# Patient Record
Sex: Male | Born: 1945 | Race: White | Hispanic: No | Marital: Married | Smoking: Former smoker
Health system: Southern US, Community
[De-identification: ages and names within clinical notes are randomized; demographics above are authoritative.]

## PROBLEM LIST (undated history)

## (undated) DIAGNOSIS — I2699 Other pulmonary embolism without acute cor pulmonale: Secondary | ICD-10-CM

## (undated) DIAGNOSIS — R1310 Dysphagia, unspecified: Secondary | ICD-10-CM

## (undated) DIAGNOSIS — U099 Post covid-19 condition, unspecified: Secondary | ICD-10-CM

## (undated) DIAGNOSIS — C140 Malignant neoplasm of pharynx, unspecified: Secondary | ICD-10-CM

## (undated) HISTORY — PX: HERNIA REPAIR: SHX51

---

## 2020-01-10 ENCOUNTER — Inpatient Hospital Stay
Admission: EM | Admit: 2020-01-10 | Discharge: 2020-01-12 | DRG: 177 | Disposition: A | Discharge status: Home / Self Care | Payer: No Typology Code available for payment source | Attending: Internal Medicine | Admitting: Internal Medicine

## 2020-01-10 ENCOUNTER — Other Ambulatory Visit: Payer: Self-pay

## 2020-01-10 ENCOUNTER — Emergency Department: Payer: No Typology Code available for payment source

## 2020-01-10 DIAGNOSIS — U071 COVID-19: Secondary | ICD-10-CM | POA: Diagnosis present

## 2020-01-10 DIAGNOSIS — D6959 Other secondary thrombocytopenia: Secondary | ICD-10-CM | POA: Diagnosis present

## 2020-01-10 DIAGNOSIS — R531 Weakness: Secondary | ICD-10-CM | POA: Diagnosis present

## 2020-01-10 DIAGNOSIS — R030 Elevated blood-pressure reading, without diagnosis of hypertension: Secondary | ICD-10-CM | POA: Diagnosis not present

## 2020-01-10 DIAGNOSIS — J1282 Pneumonia due to coronavirus disease 2019: Secondary | ICD-10-CM | POA: Diagnosis present

## 2020-01-10 DIAGNOSIS — Z87891 Personal history of nicotine dependence: Secondary | ICD-10-CM

## 2020-01-10 DIAGNOSIS — E871 Hypo-osmolality and hyponatremia: Secondary | ICD-10-CM | POA: Diagnosis present

## 2020-01-10 DIAGNOSIS — D72819 Decreased white blood cell count, unspecified: Secondary | ICD-10-CM | POA: Diagnosis present

## 2020-01-10 DIAGNOSIS — R7401 Elevation of levels of liver transaminase levels: Secondary | ICD-10-CM | POA: Diagnosis present

## 2020-01-10 DIAGNOSIS — J9601 Acute respiratory failure with hypoxia: Secondary | ICD-10-CM | POA: Diagnosis present

## 2020-01-10 HISTORY — DX: COVID-19: U07.1

## 2020-01-10 HISTORY — DX: Pneumonia due to coronavirus disease 2019: J12.82

## 2020-01-10 LAB — TROPONIN I (HIGH SENSITIVITY): Troponin I (High Sensitivity): 21 ng/L — ABNORMAL HIGH (ref <18)

## 2020-01-10 LAB — COMPREHENSIVE METABOLIC PANEL
ALT: 14 U/L (ref 0–44)
AST: 50 U/L — ABNORMAL HIGH (ref 15–41)
Albumin: 3.2 g/dL — ABNORMAL LOW (ref 3.5–5.0)
Alkaline Phosphatase: 40 U/L (ref 38–126)
Anion gap: 11 (ref 5–15)
BUN: 18 mg/dL (ref 8–23)
CO2: 21 mmol/L — ABNORMAL LOW (ref 22–32)
Calcium: 7.6 mg/dL — ABNORMAL LOW (ref 8.9–10.3)
Chloride: 98 mmol/L (ref 98–111)
Creatinine, Ser: 1.02 mg/dL (ref 0.61–1.24)
GFR calc Af Amer: >60 mL/min (ref >60)
GFR calc non Af Amer: >60 mL/min (ref >60)
Glucose, Bld: 112 mg/dL — ABNORMAL HIGH (ref 70–99)
Potassium: 4.5 mmol/L (ref 3.5–5.1)
Sodium: 130 mmol/L — ABNORMAL LOW (ref 135–145)
Total Bilirubin: 1.5 mg/dL — ABNORMAL HIGH (ref 0.3–1.2)
Total Protein: 6 g/dL — ABNORMAL LOW (ref 6.5–8.1)

## 2020-01-10 LAB — CBC
HCT: 39.6 % (ref 39.0–52.0)
Hemoglobin: 13.8 g/dL (ref 13.0–17.0)
MCH: 30.6 pg (ref 26.0–34.0)
MCHC: 34.8 g/dL (ref 30.0–36.0)
MCV: 87.8 fL (ref 80.0–100.0)
Platelets: 86 10*3/uL — ABNORMAL LOW (ref 150–400)
RBC: 4.51 MIL/uL (ref 4.22–5.81)
RDW: 14.6 % (ref 11.5–15.5)
WBC: 3.4 10*3/uL — ABNORMAL LOW (ref 4.0–10.5)
nRBC: 0 % (ref 0.0–0.2)

## 2020-01-10 LAB — POC SARS CORONAVIRUS 2 AG: SARS Coronavirus 2 Ag: POSITIVE — AB

## 2020-01-10 LAB — BRAIN NATRIURETIC PEPTIDE: B Natriuretic Peptide: 73 pg/mL (ref 0.0–100.0)

## 2020-01-10 MED ORDER — DEXAMETHASONE SODIUM PHOSPHATE 10 MG/ML IJ SOLN
10.0000 mg | Freq: Once | INTRAMUSCULAR | Status: AC
  Administered 2020-01-10: 23:00:00 10 mg via INTRAVENOUS
  Filled 2020-01-10: qty 1

## 2020-01-10 MED ORDER — SODIUM CHLORIDE 0.9 % IV SOLN
100.0000 mg | Freq: Every day | INTRAVENOUS | Status: DC
  Administered 2020-01-12: 100 mg via INTRAVENOUS
  Filled 2020-01-10: qty 20
  Filled 2020-01-10: qty 100

## 2020-01-10 MED ORDER — IOHEXOL 350 MG/ML SOLN
75.0000 mL | Freq: Once | INTRAVENOUS | Status: AC | PRN
  Administered 2020-01-10: 23:00:00 75 mL via INTRAVENOUS

## 2020-01-10 MED ORDER — SODIUM CHLORIDE 0.9 % IV SOLN
200.0000 mg | Freq: Once | INTRAVENOUS | Status: AC
  Administered 2020-01-11: 01:00:00 200 mg via INTRAVENOUS
  Filled 2020-01-10: qty 40

## 2020-01-10 NOTE — Progress Notes (Signed)
Remdesivir - Pharmacy Brief Note   O:  ALT: 14 CXR: evidence of infection SpO2: 98% on [place]    A/P:  Remdesivir 200 mg IVPB once followed by 100 mg IVPB daily x 4 days.   Hart Robinsons, PharmD Clinical Pharmacist  01/10/2020 11:07 PM

## 2020-01-10 NOTE — ED Provider Notes (Signed)
[facility] Emergency Department Provider Note  Time seen: 9:33 PM  I have reviewed the triage vital signs and the nursing notes.   HISTORY  Chief Complaint Weakness, dyspnea  HPI Geoffrey Wells is a 74 y.o. male presents to the emergency department for weakness and dyspnea on exertion.  According to the patient for the past several weeks he has been feeling very short of breath especially with exertion.  States he is also been feeling weak and fatigued with occasional subjective fever.  Patient states he wants to make sure he does not have Covid.  Patient found to be hypoxic in the 80s by EMS was placed on 3 L of O2.  They attempted to walk the patient to the ambulance and he desatted significantly requiring 6 L of oxygen.  Patient is back to 2 L of oxygen satting in the mid 90s.  No baseline O2 requirement.  Patient states slight cough but denies any chest pain at any point.  No past medical history on file.  There are no problems to display for this patient.   Prior to Admission medications   Not on File    Not on File  No family history on file.  Social History Social History   Tobacco Use  . Smoking status: Not on file  Substance Use Topics  . Alcohol use: Not on file  . Drug use: Not on file    Review of Systems Constitutional: Subjective fever at times.  Positive for generalized fatigue/weakness Cardiovascular: Negative for chest pain. Respiratory: Positive for shortness of breath worse with exertion.  Occasional cough. Gastrointestinal: Negative for abdominal pain Genitourinary: Negative for urinary compaints Musculoskeletal: Negative for musculoskeletal complaints Neurological: Negative for headache All other ROS negative  ____________________________________________   PHYSICAL EXAM:  VITAL SIGNS: ED Triage Vitals  Enc Vitals Group     BP      Pulse      Resp      Temp      Temp src      SpO2      Weight      Height      Head  Circumference      Peak Flow      Pain Score      Pain Loc      Pain Edu?      Excl. in [place]?     Constitutional: Alert and oriented. Well appearing and in no distress. Eyes: Normal exam ENT      Head: Normocephalic and atraumatic.      Mouth/Throat: Mucous membranes are moist. Cardiovascular: Normal rate, regular rhythm.  Respiratory: Normal respiratory effort without tachypnea nor retractions. Breath sounds are clear.  No wheeze rales or rhonchi. Gastrointestinal: Soft and nontender. No distention. Musculoskeletal: Nontender with normal range of motion in all extremities. No lower extremity tenderness or edema. Neurologic:  Normal speech and language. No gross focal neurologic deficits  Skin:  Skin is warm, dry and intact.  Psychiatric: Mood and affect are normal.  ____________________________________________    EKG  EKG viewed and interpreted by myself shows a normal sinus rhythm at 75 bpm with a narrow QRS, normal axis, largely normal intervals, no concerning ST changes.  ____________________________________________    [id number]  I have reviewed the CT images no obvious sign of PE awaiting radiology read.  ____________________________________________   INITIAL IMPRESSION / ASSESSMENT AND PLAN / ED COURSE  Pertinent labs & imaging results that were available during my care of the  patient were reviewed by me and considered in my medical decision making (see chart for details).   Patient presents to the emergency department for dyspnea on exertion generalized fatigue and weakness.  Differential is quite broad would include infectious etiologies such as pneumonia, Covid, UTI.  Could include metabolic abnormalities, dehydration.  We will check labs, urinalysis, IV hydrate.  Given the patient's hypoxia we will obtain CTA imaging of the chest to rule out pulmonary embolism or pneumonia.  We will also obtain a Covid swab.  Patient's Covid test has resulted positive.  I reviewed  the CTA images no obvious sign of PE.  Highly suspect Covid causing the patient's confusion and hypoxemia.  We will start the patient on remdesivir and Decadron and admit to the hospitalist service.  Geoffrey Wells was evaluated in Emergency Department on 01/10/2020 for the symptoms described in the history of present illness. He was evaluated in the context of the global COVID-19 pandemic, which necessitated consideration that the patient might be at risk for infection with the SARS-CoV-2 virus that causes COVID-19. Institutional protocols and algorithms that pertain to the evaluation of patients at risk for COVID-19 are in a state of rapid change based on information released by regulatory bodies including the CDC and federal and state organizations. These policies and algorithms were followed during the patient's care in the ED.  ____________________________________________   FINAL CLINICAL IMPRESSION(S) / ED DIAGNOSES  Dyspnea on exertion COVID-19   Harvest Dark, MD 01/10/20 2255

## 2020-01-10 NOTE — ED Triage Notes (Signed)
Pt arrives [facility] from home w cc of weakness. Pt family states he has been tired on exertion for the past week and 88% room air at home. Pt does not wear oxygen. Pt placed on 3L, sats in 90s until pt walked w EMS and desatted to 80s. Pt became diaphoretic and pale per EMS but improved when placed on 8L [place].  Pt arrives A&Ox4, placed back on 3L [place] and 94%. PT reports "I want to be tested for covid because I've been weak the past few weeks and coughing a little". Denies fevers at home, temp 99.9 here

## 2020-01-10 NOTE — ED Notes (Signed)
Pt states SOB on exertion. Pt states when he gets up and walk he gets SOB. Pt states he normally does not use oxygen but is on 2LPM via [place]. Pt on pulse ox, cardiac and BP monitoring. Admit provider at bedside.

## 2020-01-11 ENCOUNTER — Encounter: Payer: Self-pay | Admitting: Family Medicine

## 2020-01-11 DIAGNOSIS — J9621 Acute and chronic respiratory failure with hypoxia: Secondary | ICD-10-CM

## 2020-01-11 DIAGNOSIS — D696 Thrombocytopenia, unspecified: Secondary | ICD-10-CM

## 2020-01-11 LAB — BASIC METABOLIC PANEL
Anion gap: 9 (ref 5–15)
BUN: 18 mg/dL (ref 8–23)
CO2: 26 mmol/L (ref 22–32)
Calcium: 8.3 mg/dL — ABNORMAL LOW (ref 8.9–10.3)
Chloride: 98 mmol/L (ref 98–111)
Creatinine, Ser: 0.97 mg/dL (ref 0.61–1.24)
GFR calc Af Amer: >60 mL/min (ref >60)
GFR calc non Af Amer: >60 mL/min (ref >60)
Glucose, Bld: 155 mg/dL — ABNORMAL HIGH (ref 70–99)
Potassium: 4.4 mmol/L (ref 3.5–5.1)
Sodium: 133 mmol/L — ABNORMAL LOW (ref 135–145)

## 2020-01-11 LAB — CBC
HCT: 41.3 % (ref 39.0–52.0)
Hemoglobin: 14.3 g/dL (ref 13.0–17.0)
MCH: 30.1 pg (ref 26.0–34.0)
MCHC: 34.6 g/dL (ref 30.0–36.0)
MCV: 86.9 fL (ref 80.0–100.0)
Platelets: 88 10*3/uL — ABNORMAL LOW (ref 150–400)
RBC: 4.75 MIL/uL (ref 4.22–5.81)
RDW: 14.8 % (ref 11.5–15.5)
WBC: 2.9 10*3/uL — ABNORMAL LOW (ref 4.0–10.5)
nRBC: 0 % (ref 0.0–0.2)

## 2020-01-11 LAB — FIBRIN DERIVATIVES D-DIMER (ARMC ONLY): Fibrin derivatives D-dimer (ARMC): 1154.14 ng/mL (FEU) — ABNORMAL HIGH (ref 0.00–499.00)

## 2020-01-11 LAB — C-REACTIVE PROTEIN: CRP: 4.1 mg/dL — ABNORMAL HIGH (ref <1.0)

## 2020-01-11 LAB — TROPONIN I (HIGH SENSITIVITY): Troponin I (High Sensitivity): 25 ng/L — ABNORMAL HIGH (ref <18)

## 2020-01-11 LAB — FERRITIN: Ferritin: 344 ng/mL — ABNORMAL HIGH (ref 24–336)

## 2020-01-11 LAB — PROCALCITONIN
Procalcitonin: <0.1 ng/mL
Procalcitonin: <0.1 ng/mL

## 2020-01-11 LAB — ABO/RH: ABO/RH(D): O POS

## 2020-01-11 LAB — LACTATE DEHYDROGENASE: LDH: 235 U/L — ABNORMAL HIGH (ref 98–192)

## 2020-01-11 MED ORDER — SODIUM CHLORIDE 0.9 % IV SOLN: 100.0000 mg | Freq: Every day | INTRAVENOUS | Status: DC

## 2020-01-11 MED ORDER — ONDANSETRON HCL 4 MG PO TABS: 4.0000 mg | ORAL_TABLET | Freq: Four times a day (QID) | ORAL | Status: DC | PRN

## 2020-01-11 MED ORDER — ENOXAPARIN SODIUM 40 MG/0.4ML ~~LOC~~ SOLN
40.0000 mg | SUBCUTANEOUS | Status: DC
  Administered 2020-01-11 – 2020-01-12 (×2): 40 mg via SUBCUTANEOUS
  Filled 2020-01-11 (×2): qty 0.4

## 2020-01-11 MED ORDER — MAGNESIUM HYDROXIDE 400 MG/5ML PO SUSP
30.0000 mL | Freq: Every day | ORAL | Status: DC | PRN
  Filled 2020-01-11: qty 30

## 2020-01-11 MED ORDER — ASCORBIC ACID 500 MG PO TABS
500.0000 mg | ORAL_TABLET | Freq: Every day | ORAL | Status: DC
  Administered 2020-01-11 – 2020-01-12 (×2): 500 mg via ORAL
  Filled 2020-01-11 (×2): qty 1

## 2020-01-11 MED ORDER — SODIUM CHLORIDE 0.9 % IV SOLN: 200.0000 mg | Freq: Once | INTRAVENOUS | Status: DC

## 2020-01-11 MED ORDER — ZINC SULFATE 220 (50 ZN) MG PO CAPS
220.0000 mg | ORAL_CAPSULE | Freq: Every day | ORAL | Status: DC
  Administered 2020-01-11 – 2020-01-12 (×2): 220 mg via ORAL
  Filled 2020-01-11 (×2): qty 1

## 2020-01-11 MED ORDER — DEXAMETHASONE SODIUM PHOSPHATE 10 MG/ML IJ SOLN
6.0000 mg | INTRAMUSCULAR | Status: DC
  Administered 2020-01-11: 6 mg via INTRAVENOUS
  Filled 2020-01-11: qty 1

## 2020-01-11 MED ORDER — HYDROCOD POLST-CPM POLST ER 10-8 MG/5ML PO SUER: 5.0000 mL | Freq: Two times a day (BID) | ORAL | Status: DC | PRN

## 2020-01-11 MED ORDER — GUAIFENESIN-DM 100-10 MG/5ML PO SYRP
10.0000 mL | ORAL_SOLUTION | ORAL | Status: DC | PRN
  Filled 2020-01-11: qty 10

## 2020-01-11 MED ORDER — ONDANSETRON HCL 4 MG/2ML IJ SOLN: 4.0000 mg | Freq: Four times a day (QID) | INTRAMUSCULAR | Status: DC | PRN

## 2020-01-11 MED ORDER — TRAZODONE HCL 50 MG PO TABS: 25.0000 mg | ORAL_TABLET | Freq: Every evening | ORAL | Status: DC | PRN

## 2020-01-11 MED ORDER — ASPIRIN EC 81 MG PO TBEC
81.0000 mg | DELAYED_RELEASE_TABLET | Freq: Every day | ORAL | Status: DC
  Administered 2020-01-11 – 2020-01-12 (×2): 81 mg via ORAL
  Filled 2020-01-11 (×2): qty 1

## 2020-01-11 MED ORDER — IPRATROPIUM-ALBUTEROL 20-100 MCG/ACT IN AERS
1.0000 | INHALATION_SPRAY | Freq: Four times a day (QID) | RESPIRATORY_TRACT | Status: DC | PRN
  Filled 2020-01-11: qty 4

## 2020-01-11 MED ORDER — SODIUM CHLORIDE 0.9 % IV SOLN: INTRAVENOUS | Status: DC

## 2020-01-11 MED ORDER — FAMOTIDINE 20 MG PO TABS
20.0000 mg | ORAL_TABLET | Freq: Two times a day (BID) | ORAL | Status: DC
  Administered 2020-01-11 – 2020-01-12 (×4): 20 mg via ORAL
  Filled 2020-01-11 (×3): qty 1

## 2020-01-11 MED ORDER — VITAMIN D 25 MCG (1000 UNIT) PO TABS
1000.0000 [IU] | ORAL_TABLET | Freq: Every day | ORAL | Status: DC
  Administered 2020-01-11 – 2020-01-12 (×2): 1000 [IU] via ORAL
  Filled 2020-01-11 (×2): qty 1

## 2020-01-11 MED ORDER — ACETAMINOPHEN 325 MG PO TABS: 650.0000 mg | ORAL_TABLET | Freq: Four times a day (QID) | ORAL | Status: DC | PRN

## 2020-01-11 NOTE — Progress Notes (Signed)
Wife given nightly update 

## 2020-01-11 NOTE — H&P (Addendum)
[facility] [place] NAME: Geoffrey Wells    MR#:  [id number]  DATE OF BIRTH:  August 15, 1946  DATE OF ADMISSION:  01/10/2020  PRIMARY CARE PHYSICIAN: [facility] [place]   REQUESTING/REFERRING PHYSICIAN: Harvest Dark, MD CHIEF COMPLAINT:   Chief Complaint  Patient presents with  . Weakness    HISTORY OF PRESENT ILLNESS:  Geoffrey Wells  is a 74 y.o. Caucasian male with no known medical problems, who presented to the emergency room with acute onset of generalized fatigue and tiredness over the last 7 days with associated mild dry cough without wheezing or dyspnea.  He denied any loss of taste or smell, nausea or vomiting or diarrhea or abdominal pain.  He admitted to tactile fever and diaphoresis mainly on exertion but did not check his temperature.  No chest pain or palpitations.  Upon presentation to the emergency room, blood pressure was 147/89 with pulse oximetry of 94% on 3 L of O2 by nasal cannula and otherwise otherwise vital signs were within normal.  Labs revealed hyponatremia 130 and AST 50 with total protein of 6 with total bili 1.5.  BNP was 73 and high-sensitivity troponin I was 21.  CBC showed mild leukopenia of 3.4 and thrombocytopenia of 86.  COVID-19 antigen came back positive.  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  The patient was given IV remdesivir IV Decadron.  He will be admitted to a medically monitored bed for further evaluation and management.  PAST MEDICAL HISTORY:  History reviewed. No pertinent past medical history.  PAST SURGICAL HISTORY:   Past Surgical History:  Procedure Laterality Date  . HERNIA REPAIR      SOCIAL HISTORY:   Social History   Tobacco Use  . Smoking status: Former Smoker    Packs/day: 0.50    Years:  25.00    Pack years: 12.50    Types: Cigarettes  Substance Use Topics  . Alcohol use: Yes    Alcohol/week: 7.0 standard drinks    Types: 7 Cans of beer per week    FAMILY HISTORY:  No family history on file.  No pertinent familial diseases.  DRUG ALLERGIES:  No Known Allergies  REVIEW OF SYSTEMS:   ROS As per history of present illness. All pertinent systems were reviewed above. Constitutional,  HEENT, cardiovascular, respiratory, GI, GU, musculoskeletal, neuro, psychiatric, endocrine,  integumentary and hematologic systems were reviewed and are otherwise  negative/unremarkable except for positive findings mentioned above in the HPI.   MEDICATIONS AT HOME:   Prior to Admission medications   Not on File      VITAL SIGNS:  Blood pressure 117/83, pulse 72, temperature 99.9 F (37.7 C), temperature source Oral, resp. rate 20, height 6\' 1"  (1.854 m), weight 81.6 kg, SpO2 97 %.  PHYSICAL EXAMINATION:  Physical Exam  GENERAL:  74 y.o.-year-old Caucasian male patient lying in the bed with minimal respiratory distress with conversational dyspnea.   EYES: Pupils equal, round, reactive to light and accommodation. No scleral icterus. Extraocular muscles intact.  HEENT: Head atraumatic, normocephalic. Oropharynx and nasopharynx clear.  NECK:  Supple, no jugular venous distention. No thyroid enlargement, no tenderness.  LUNGS: Diminished bibasal breath sounds with bibasal crackles. CARDIOVASCULAR: Regular rate and rhythm, S1, S2 normal. No murmurs, rubs, or gallops.  ABDOMEN: Soft, nondistended, nontender. Bowel sounds present.  No organomegaly or mass.  EXTREMITIES: No pedal edema, cyanosis, or clubbing.  NEUROLOGIC: Cranial nerves II through XII are intact. Muscle strength 5/5 in all extremities. Sensation intact. Gait not checked.  PSYCHIATRIC: The patient is alert and oriented x 3.  Normal affect and good eye contact. SKIN: No obvious rash, lesion, or ulcer.   LABORATORY PANEL:    CBC Recent Labs  Lab 01/10/20 2133  WBC 3.4*  HGB 13.8  HCT 39.6  PLT 86*   ------------------------------------------------------------------------------------------------------------------  Chemistries  Recent Labs  Lab 01/10/20 2133  NA 130*  K 4.5  CL 98  CO2 21*  GLUCOSE 112*  BUN 18  CREATININE 1.02  CALCIUM 7.6*  AST 50*  ALT 14  ALKPHOS 40  BILITOT 1.5*   ------------------------------------------------------------------------------------------------------------------  Cardiac Enzymes No results for input(s): TROPONINI in the last 168 hours. ------------------------------------------------------------------------------------------------------------------  RADIOLOGY:  CT Angio Chest PE W and/or Wo Contrast  Result Date: 01/10/2020 CLINICAL DATA:  Hypoxemia EXAM: CT ANGIOGRAPHY CHEST WITH CONTRAST TECHNIQUE: Multidetector CT imaging of the chest was performed using the standard protocol during bolus administration of intravenous contrast. Multiplanar CT image reconstructions and MIPs were obtained to evaluate the vascular anatomy. CONTRAST:  15mL OMNIPAQUE IOHEXOL 350 MG/ML SOLN COMPARISON:  None. FINDINGS: Cardiovascular: There is a optimal opacification of the pulmonary arteries. There is no central,segmental, or subsegmental filling defects within the pulmonary arteries. There is mild cardiomegaly. No pericardial effusion or thickening. No evidence right heart strain. There is normal three-vessel brachiocephalic anatomy. The ascending intrathoracic aorta appears to be dilated measuring up to 4.3 cm which tapers at the level of the aortic arch, however contrast is preferentially within the pulmonary arteries. Scattered aortic atherosclerosis is seen. There is also atherosclerosis seen at the origin of the great vessels. Mediastinum/Nodes: No hilar, mediastinal, or axillary adenopathy. Thyroid gland, trachea, and esophagus demonstrate no significant findings.  Lungs/Pleura: Ground-glass hazy airspace opacities seen throughout the right lung with patchy airspace opacities seen posteriorly at the right lung base. There is also patchy airspace opacity seen at the posterior left lung base. No pleural effusion is seen. Upper Abdomen: No acute abnormalities present in the visualized portions of the upper abdomen. Musculoskeletal: No chest wall abnormality. No acute or significant osseous findings. Review of the MIP images confirms the above findings. IMPRESSION: 1. No central, segmental, or subsegmental pulmonary embolism. 2. Multifocal patchy ground-glass hazy airspace opacities seen throughout both lungs right greater than left, likely consistent with multifocal pneumonia. 3. 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 Recommend annual imaging followup by CTA or MRA. This recommendation follows 2010 ACCF/AHA/AATS/ACR/ASA/SCA/SCAI/SIR/STS/SVM Guidelines for the Diagnosis and Management of Patients with Thoracic Aortic Disease. Circulation. 2010; [id number]. Aortic aneurysm NOS (ICD10-I71.9) 4.  Aortic Atherosclerosis (ICD10-I70.0). Electronically Signed   By: Prudencio Pair M.D.   On: 01/10/2020 23:00      IMPRESSION AND PLAN:  1.  Acute hypoxemic respiratory failure secondary to COVID-19. -The patient will be admitted to a medically monitored isolation bed. -O2 protocol will be followed to keep O2 saturation above 93.   2.  Multifocal pneumonia with generalized weakness secondary to COVID-19. -The patient will be admitted to an isolation monitored bed with droplet and contact precautions. -The patient will be placed on scheduled Mucinex and as needed Tussionex. -We will follow CRP, ferritin, LDH and D-dimer. -Will follow manual differential for ANC/ALC ratio as well as follow troponin I and daily CBC with manual differential and CMP. - Will place the patient on IV  Remdesivir and IV steroid therapy with Decadron with elevated inflammatory markers. -The  patient will be placed on vitamin D3, vitamin C, zinc sulfate, p.o. Pepcid and aspirin. -Actemra can be considered for CRP more than 7 with associated hypoxemia.  3.  Elevated blood pressure. -Blood pressure will be monitored while he is here.  It has already improved.  4.  DVT prophylaxis. -Subcutaneous Lovenox.    All the records are reviewed and case discussed with ED provider. The plan of care was discussed in details with the patient (and family). I answered all questions. The patient agreed to proceed with the above mentioned plan. Further management will depend upon hospital course.   CODE STATUS: Full code  Status is: Inpatient  Remains inpatient appropriate because:Ongoing diagnostic testing needed not appropriate for outpatient work up, Unsafe d/c plan, IV treatments appropriate due to intensity of illness or inability to take PO and Inpatient level of care appropriate due to severity of illness   Dispo: The patient is from: Home              Anticipated d/c is to: Home              Anticipated d/c date is: 3 days              Patient currently is not medically stable to d/c.    TOTAL TIME TAKING CARE OF THIS PATIENT: 55 minutes.    Christel Mormon M.D on 01/11/2020 at 12:23 AM  [facility]   From 7 PM-7 AM, contact night-coverage [email or web address]  CC: Primary care physician; [place]   Note: This dictation was prepared with Dragon dictation along with smaller phrase technology. Any transcriptional errors that result from this process are unintentional.

## 2020-01-11 NOTE — Progress Notes (Signed)
PROGRESS NOTE    Geoffrey Wells  [id number] DOB: 1945-11-14 DOA: 01/10/2020 PCP: [facility] [place]:   Active Problems:   COVID-19  Acute hypoxic respiratory failure: secondary to COVID-19. Continue on IV remdesivir, decadron, vitamin c, & zinc. Continue on bronchodilators. Encourage incentive spirometry and flutter valve. Continue on supplemental oxygen and wean as tolerated   COVID19 pneumonia: Continue on IV remdesivir, decadron, vitamin c, & zinc. Continue on bronchodilators. Encourage incentive spirometry and flutter valve. Continue on supplemental oxygen and wean as tolerated. Airborne and contact precautions. Inflammatory markers are elevated.   Leukopenia: secondary to [place]. Will continue to monitor  Thrombocytopenia: secondary to [place]. Will continue to monitor   Transaminitis: likely secondary to [place]. ALT is WNL. AST is mildly elevated. Will continue to monitor  Generalized weakness: likely secondary to [place]. PT/OT consulted       DVT prophylaxis: lovenox Code Status: full  Family Communication: called pt's wife but no answer so I left a message  Disposition Plan: depends on PT/OT recs    Consultants:      Procedures:    Antimicrobials:    Subjective: Pt c/o shortness of breath.  Objective: Vitals:   01/11/20 0827 01/11/20 0827 01/11/20 0908 01/11/20 1413  BP: 130/81  (!) 152/67 131/75  Pulse: 62  75 74  Resp: (!) 23  17 17   Temp:  97.9 F (36.6 C) 98.2 F (36.8 C) 97.8 F (36.6 C)  TempSrc:  Oral Oral Oral  SpO2: 95%  98% 97%  Weight:      Height:        Intake/Output Summary (Last 24 hours) at 01/11/2020 1448 Last data filed at 01/11/2020 1019 Gross per 24 hour  Intake 523.37 ml  Output --  Net 523.37 ml   Filed Weights   01/10/20 2138  Weight: 81.6 kg    Examination:  General exam: Appears calm and comfortable  Respiratory system: diminished breath sounds b/l Cardiovascular system: S1  & S2+. No rubs, gallops or clicks.  Gastrointestinal system: Abdomen is nondistended, soft and nontender. . Normal bowel sounds heard. Central nervous system: Alert and oriented. Moves all 4 extremities  Psychiatry: Judgement and insight appear normal. Mood & affect appropriate.     Data Reviewed: I have personally reviewed following labs and imaging studies  CBC: Recent Labs  Lab 01/10/20 2133 01/11/20 0828  WBC 3.4* 2.9*  HGB 13.8 14.3  HCT 39.6 41.3  MCV 87.8 86.9  PLT 86* 88*   Basic Metabolic Panel: Recent Labs  Lab 01/10/20 2133 01/11/20 0828  NA 130* 133*  K 4.5 4.4  CL 98 98  CO2 21* 26  GLUCOSE 112* 155*  BUN 18 18  CREATININE 1.02 0.97  CALCIUM 7.6* 8.3*   GFR: Estimated Creatinine Clearance: 77.8 mL/min (by C-G formula based on SCr of 0.97 mg/dL). Liver Function Tests: Recent Labs  Lab 01/10/20 2133  AST 50*  ALT 14  ALKPHOS 40  BILITOT 1.5*  PROT 6.0*  ALBUMIN 3.2*   No results for input(s): LIPASE, AMYLASE in the last 168 hours. No results for input(s): AMMONIA in the last 168 hours. Coagulation Profile: No results for input(s): INR, PROTIME in the last 168 hours. Cardiac Enzymes: No results for input(s): CKTOTAL, CKMB, CKMBINDEX, TROPONINI in the last 168 hours. BNP (last 3 results) No results for input(s): PROBNP in the last 8760 hours. HbA1C: No results for input(s): HGBA1C in the last 72 hours. CBG: No results for  input(s): GLUCAP in the last 168 hours. Lipid Profile: No results for input(s): CHOL, HDL, LDLCALC, TRIG, CHOLHDL, LDLDIRECT in the last 72 hours. Thyroid Function Tests: No results for input(s): TSH, T4TOTAL, FREET4, T3FREE, THYROIDAB in the last 72 hours. Anemia Panel: Recent Labs    01/11/20 0037  FERRITIN 344*   Sepsis Labs: Recent Labs  Lab 01/11/20 0037 01/11/20 0529  PROCALCITON <0.10 <0.10    No results found for this or any previous visit (from the past 240 hour(s)).       Radiology Studies: CT  Angio Chest PE W and/or Wo Contrast  Result Date: 01/10/2020 CLINICAL DATA:  Hypoxemia EXAM: CT ANGIOGRAPHY CHEST WITH CONTRAST TECHNIQUE: Multidetector CT imaging of the chest was performed using the standard protocol during bolus administration of intravenous contrast. Multiplanar CT image reconstructions and MIPs were obtained to evaluate the vascular anatomy. CONTRAST:  68mL OMNIPAQUE IOHEXOL 350 MG/ML SOLN COMPARISON:  None. FINDINGS: Cardiovascular: There is a optimal opacification of the pulmonary arteries. There is no central,segmental, or subsegmental filling defects within the pulmonary arteries. There is mild cardiomegaly. No pericardial effusion or thickening. No evidence right heart strain. There is normal three-vessel brachiocephalic anatomy. The ascending intrathoracic aorta appears to be dilated measuring up to 4.3 cm which tapers at the level of the aortic arch, however contrast is preferentially within the pulmonary arteries. Scattered aortic atherosclerosis is seen. There is also atherosclerosis seen at the origin of the great vessels. Mediastinum/Nodes: No hilar, mediastinal, or axillary adenopathy. Thyroid gland, trachea, and esophagus demonstrate no significant findings. Lungs/Pleura: Ground-glass hazy airspace opacities seen throughout the right lung with patchy airspace opacities seen posteriorly at the right lung base. There is also patchy airspace opacity seen at the posterior left lung base. No pleural effusion is seen. Upper Abdomen: No acute abnormalities present in the visualized portions of the upper abdomen. Musculoskeletal: No chest wall abnormality. No acute or significant osseous findings. Review of the MIP images confirms the above findings. IMPRESSION: 1. No central, segmental, or subsegmental pulmonary embolism. 2. Multifocal patchy ground-glass hazy airspace opacities seen throughout both lungs right greater than left, likely consistent with multifocal pneumonia. 3. Ascending  thoracic aorta measuring up to 4.3 cm. Recommend annual imaging followup by CTA or MRA. This recommendation follows 2010 ACCF/AHA/AATS/ACR/ASA/SCA/SCAI/SIR/STS/SVM Guidelines for the Diagnosis and Management of Patients with Thoracic Aortic Disease. Circulation. 2010; [id number]. Aortic aneurysm NOS (ICD10-I71.9) 4.  Aortic Atherosclerosis (ICD10-I70.0). Electronically Signed   By: Prudencio Pair M.D.   On: 01/10/2020 23:00        Scheduled Meds: . vitamin C  500 mg Oral Daily  . aspirin EC  81 mg Oral Daily  . cholecalciferol  1,000 Units Oral Daily  . dexamethasone (DECADRON) injection  6 mg Intravenous Q24H  . enoxaparin (LOVENOX) injection  40 mg Subcutaneous Q24H  . famotidine  20 mg Oral BID  . zinc sulfate  220 mg Oral Daily   Continuous Infusions: . sodium chloride 100 mL/hr at 01/11/20 1019  . [START ON 01/12/2020] remdesivir 100 mg in NS 100 mL       LOS: 1 day    Time spent: 30 mins     Wyvonnia Dusky, MD [facility] Pager [phone]  If 7PM-7AM, please contact night-coverage [email or web address] 01/11/2020, 2:48 PM

## 2020-01-11 NOTE — ED Notes (Signed)
Pt ambulated to restroom independently, pt returned to bed repositioned and given fresh blankets

## 2020-01-11 NOTE — ED Notes (Signed)
Blood drawn and sent to lab.

## 2020-01-12 DIAGNOSIS — R531 Weakness: Secondary | ICD-10-CM

## 2020-01-12 LAB — COMPREHENSIVE METABOLIC PANEL
ALT: 18 U/L (ref 0–44)
AST: 36 U/L (ref 15–41)
Albumin: 2.9 g/dL — ABNORMAL LOW (ref 3.5–5.0)
Alkaline Phosphatase: 38 U/L (ref 38–126)
Anion gap: 7 (ref 5–15)
BUN: 24 mg/dL — ABNORMAL HIGH (ref 8–23)
CO2: 23 mmol/L (ref 22–32)
Calcium: 8 mg/dL — ABNORMAL LOW (ref 8.9–10.3)
Chloride: 105 mmol/L (ref 98–111)
Creatinine, Ser: 0.74 mg/dL (ref 0.61–1.24)
GFR calc Af Amer: >60 mL/min (ref >60)
GFR calc non Af Amer: >60 mL/min (ref >60)
Glucose, Bld: 139 mg/dL — ABNORMAL HIGH (ref 70–99)
Potassium: 4.2 mmol/L (ref 3.5–5.1)
Sodium: 135 mmol/L (ref 135–145)
Total Bilirubin: 0.7 mg/dL (ref 0.3–1.2)
Total Protein: 5.8 g/dL — ABNORMAL LOW (ref 6.5–8.1)

## 2020-01-12 LAB — CBC WITH DIFFERENTIAL/PLATELET
Abs Immature Granulocytes: 0.02 10*3/uL (ref 0.00–0.07)
Basophils Absolute: 0 10*3/uL (ref 0.0–0.1)
Basophils Relative: 0 %
Eosinophils Absolute: 0 10*3/uL (ref 0.0–0.5)
Eosinophils Relative: 0 %
HCT: 39.4 % (ref 39.0–52.0)
Hemoglobin: 13.7 g/dL (ref 13.0–17.0)
Immature Granulocytes: 1 %
Lymphocytes Relative: 16 %
Lymphs Abs: 0.6 10*3/uL — ABNORMAL LOW (ref 0.7–4.0)
MCH: 29.9 pg (ref 26.0–34.0)
MCHC: 34.8 g/dL (ref 30.0–36.0)
MCV: 86 fL (ref 80.0–100.0)
Monocytes Absolute: 0.3 10*3/uL (ref 0.1–1.0)
Monocytes Relative: 8 %
Neutro Abs: 2.9 10*3/uL (ref 1.7–7.7)
Neutrophils Relative %: 75 %
Platelets: 100 10*3/uL — ABNORMAL LOW (ref 150–400)
RBC: 4.58 MIL/uL (ref 4.22–5.81)
RDW: 14.8 % (ref 11.5–15.5)
WBC: 3.9 10*3/uL — ABNORMAL LOW (ref 4.0–10.5)
nRBC: 0 % (ref 0.0–0.2)

## 2020-01-12 LAB — FERRITIN: Ferritin: 280 ng/mL (ref 24–336)

## 2020-01-12 LAB — FIBRIN DERIVATIVES D-DIMER (ARMC ONLY): Fibrin derivatives D-dimer (ARMC): 962.87 ng/mL (FEU) — ABNORMAL HIGH (ref 0.00–499.00)

## 2020-01-12 LAB — MAGNESIUM: Magnesium: 2.1 mg/dL (ref 1.7–2.4)

## 2020-01-12 LAB — C-REACTIVE PROTEIN: CRP: 2.6 mg/dL — ABNORMAL HIGH (ref <1.0)

## 2020-01-12 LAB — PROCALCITONIN: Procalcitonin: <0.1 ng/mL

## 2020-01-12 MED ORDER — FAMOTIDINE IN NACL 20-0.9 MG/50ML-% IV SOLN: 20.0000 mg | Freq: Once | INTRAVENOUS | Status: DC | PRN

## 2020-01-12 MED ORDER — ALBUTEROL SULFATE HFA 108 (90 BASE) MCG/ACT IN AERS
2.0000 | INHALATION_SPRAY | Freq: Once | RESPIRATORY_TRACT | Status: DC | PRN
  Filled 2020-01-12: qty 6.7

## 2020-01-12 MED ORDER — DIPHENHYDRAMINE HCL 50 MG/ML IJ SOLN: 50.0000 mg | Freq: Once | INTRAMUSCULAR | Status: DC | PRN

## 2020-01-12 MED ORDER — SODIUM CHLORIDE 0.9 % IV SOLN: 500.0000 mL | INTRAVENOUS | 0 refills | Status: DC | PRN

## 2020-01-12 MED ORDER — SODIUM CHLORIDE 0.9 % IV SOLN: INTRAVENOUS | Status: DC | PRN

## 2020-01-12 MED ORDER — METHYLPREDNISOLONE SODIUM SUCC 125 MG IJ SOLR: 125.0000 mg | Freq: Once | INTRAMUSCULAR | 0 refills | Status: DC | PRN

## 2020-01-12 MED ORDER — METHYLPREDNISOLONE SODIUM SUCC 125 MG IJ SOLR: 125.0000 mg | Freq: Once | INTRAMUSCULAR | Status: DC | PRN

## 2020-01-12 MED ORDER — ASCORBIC ACID 500 MG PO TABS: 500.0000 mg | ORAL_TABLET | Freq: Every day | ORAL | 0 refills | Status: DC

## 2020-01-12 MED ORDER — ZINC SULFATE 220 (50 ZN) MG PO CAPS: 220.0000 mg | ORAL_CAPSULE | Freq: Every day | ORAL | 0 refills | Status: DC

## 2020-01-12 MED ORDER — SODIUM CHLORIDE 0.9 % IV SOLN
100.0000 mg | Freq: Once | INTRAVENOUS | Status: DC
  Filled 2020-01-12: qty 20

## 2020-01-12 MED ORDER — EPINEPHRINE 0.3 MG/0.3ML IJ SOAJ
0.3000 mg | Freq: Once | INTRAMUSCULAR | Status: DC | PRN
  Filled 2020-01-12: qty 0.6

## 2020-01-12 MED ORDER — EPINEPHRINE 0.3 MG/0.3ML IJ SOAJ
0.3000 mg | Freq: Once | INTRAMUSCULAR | Status: DC | PRN
  Filled 2020-01-12: qty 0.3

## 2020-01-12 NOTE — TOC Transition Note (Signed)
Transition of Care [facility]) - CM/SW Discharge Note   Patient Details  Name: Geoffrey Wells MRN: [id number] Date of Birth: 1946/07/11  Transition of Care [facility]) CM/SW Contact:  Shelbie Hutching, RN Phone Number: 01/12/2020, 1:38 PM   Clinical Narrative:    Patient has been medically cleared to discharge home.  Patient will get his last 3 remdesivir infusions outpatient.  Patient did not qualify for home oxygen and is doing well on room air.  Patient's wife will pick him up.     Final next level of care: Home/Self Care Barriers to Discharge: Barriers Resolved   Patient Goals and CMS Choice Patient states their goals for this hospitalization and ongoing recovery are:: Patient would like to go home and finish his recovery there CMS Medicare.gov Compare Post Acute Care list provided to:: Patient Choice offered to / list presented to : Patient  Discharge Placement                       Discharge Plan and Services   Discharge Planning Services: CM Consult Post Acute Care Choice: Durable Medical Equipment                               Social Determinants of Health (SDOH) Interventions     Readmission Risk Interventions No flowsheet data found.

## 2020-01-12 NOTE — TOC Progression Note (Signed)
Transition of Care [facility]) - Progression Note    Patient Details  Name: Siddiq Maharrey MRN: [id number] Date of Birth: 20-Jun-1946  Transition of Care [facility]) CM/SW Contact  Shelbie Hutching, RN Phone Number: 01/12/2020, 3:36 PM  Clinical Narrative:    [facility] to his home.  Oxygen will be delivered to his room here at the hospital before discharge- should arrive before 7pm this evening.  Patient's wife will be picking him up.    Expected Discharge Plan: Home/Self Care Barriers to Discharge: Barriers Resolved  Expected Discharge Plan and Services Expected Discharge Plan: Home/Self Care   Discharge Planning Services: CM Consult Post Acute Care Choice: Durable Medical Equipment Living arrangements for the past 2 months: Single Family Home Expected Discharge Date: 01/12/20               DME Arranged: Oxygen DME Agency: [place] Date DME Agency Contacted: 01/12/20 Time DME Agency Contacted: [id number] Representative spoke with at DME Agency: Cephus Shelling             Social Determinants of Health ([place]) Interventions    Readmission Risk Interventions No flowsheet data found.

## 2020-01-12 NOTE — Progress Notes (Signed)
Pt d/c to home via wife. IVs removed intact. VSS. Education completed. All questions answered. Wife [name] called and questions answered.

## 2020-01-12 NOTE — Progress Notes (Signed)
SATURATION QUALIFICATIONS: (This note is used to comply with regulatory documentation for home oxygen)  Patient Saturations on Room Air at Rest = 97%  Patient Saturations on Room Air while Ambulating = 79%  Patient Saturations on 2 Liters of oxygen while Ambulating = 92%  Please briefly explain why patient needs home oxygen:

## 2020-01-12 NOTE — TOC Initial Note (Signed)
Transition of Care [facility]) - Initial/Assessment Note    Patient Details  Name: Geoffrey Wells MRN: [id number] Date of Birth: [birth year]  Transition of Care [facility]) CM/SW Contact:    Shelbie Hutching, RN Phone Number: 01/12/2020, 10:38 AM  Clinical Narrative:                 Patient admitted to the hospital with COVID 19 requiring supplemental oxygen [place] 2L.  RNCM introduced self and explained role in discharge planning.  Patient is very anxious to return home, he would like to continue to recover from home.  Patient lives with his wife and is independent in ADL's and drives.  Patient receives primary care services from the [facility].  [facility] [place] has been notified of patient's admission, H&P faxed to [phone].  This RNCM has also contacted the [facility] social worker to assist with home services.  If discharged today patient will need home O2 and outpatient remdesivir infusion for last 3 doses.  RNCM will cont to follow and assist with discharge.   Expected Discharge Plan: Home/Self Care Barriers to Discharge: Continued Medical Work up   Patient Goals and CMS Choice Patient states their goals for this hospitalization and ongoing recovery are:: Patient would like to go home and finish his recovery there CMS Medicare.gov Compare Post Acute Care list provided to:: Patient Choice offered to / list presented to : Patient  Expected Discharge Plan and Services Expected Discharge Plan: Home/Self Care   Discharge Planning Services: CM Consult Post Acute Care Choice: Durable Medical Equipment Living arrangements for the past 2 months: Single Family Home                                      Prior Living Arrangements/Services Living arrangements for the past 2 months: Single Family Home Lives with:: Spouse Patient language and need for interpreter reviewed:: Yes Do you feel safe going back to the place where you live?: Yes      Need for Family Participation in Patient Care: Yes  (Comment)(COVID) Care giver support system in place?: Yes (comment)(wife)   Criminal Activity/Legal Involvement Pertinent to Current Situation/Hospitalization: No - Comment as needed  Activities of Daily Living Home Assistive Devices/Equipment: None ADL Screening (condition at time of admission) Patient's cognitive ability adequate to safely complete daily activities?: Yes Is the patient deaf or have difficulty hearing?: Yes Does the patient have difficulty seeing, even when wearing glasses/contacts?: Yes Does the patient have difficulty concentrating, remembering, or making decisions?: Yes Patient able to express need for assistance with ADLs?: Yes Does the patient have difficulty dressing or bathing?: No Independently performs ADLs?: Yes (appropriate for developmental age) Does the patient have difficulty walking or climbing stairs?: Yes Weakness of Legs: Both Weakness of Arms/Hands: None  Permission Sought/Granted Permission sought to share information with : Case Manager, Customer service manager, Family Supports Permission granted to share information with : Yes, Verbal Permission Granted     Permission granted to share info w AGENCY: [facility] [place]        Emotional Assessment   Attitude/Demeanor/Rapport: Engaged Affect (typically observed): Accepting Orientation: : Oriented to Self, Oriented to Place, Oriented to  Time, Oriented to Situation Alcohol / Substance Use: Not Applicable Psych Involvement: No (comment)  Admission diagnosis:  Generalized weakness [R53.1] COVID-19 [U07.1] Patient Active Problem List   Diagnosis Date Noted  . COVID-19 01/10/2020   PCP:  [facility] [place]  [facility]  No Pharmacies Listed    Social Determinants of Health (SDOH) Interventions    Readmission Risk Interventions No flowsheet data found.

## 2020-01-12 NOTE — Progress Notes (Signed)
Patient scheduled for outpatient Remdesivir infusion at 10am on Thursday 5/13, Friday 5/14, and Saturday 5/15. Please advise them to report to [facility] at [address]  Drive to the security guard and tell them you are here for an infusion. They will direct you to the front entrance where we will come and get you.  For questions call [phone].  Thanks

## 2020-01-12 NOTE — Discharge Summary (Signed)
[place] [facility] [place] NAME: Geoffrey Wells    MR#:  [id number]  DATE OF BIRTH:  01-28-1946  DATE OF ADMISSION:  01/10/2020   ADMITTING PHYSICIAN: Christel Mormon, MD  DATE OF DISCHARGE: 01/12/2020  PRIMARY CARE PHYSICIAN: [facility] [place] [facility]   ADMISSION DIAGNOSIS:  Generalized weakness [R53.1] COVID-19 [U07.1] DISCHARGE DIAGNOSIS:  Active Problems:   COVID-19   Generalized weakness  SECONDARY DIAGNOSIS:  History reviewed. No pertinent past medical history. HOSPITAL COURSE:  Geoffrey Wells  is a 74 y.o. Caucasian male with no known medical problems admitted for Covid pneumonia.  COVID19 pneumonia: Treated with remdesivir and steroids. -IV remdesivir for 2 days.  Patient would like to finish remaining 3 days of remdesivir at outpatient remdesivir clinic in [place].  This has been set up. -Patient requiring 2 L oxygen via nasal cannula his oxygen saturation dropped below 88% -Continue vitamin C and zinc -Patient pulmonary follow-up  Leukopenia: secondary to [place].   Thrombocytopenia: secondary to [place].  Transaminitis: likely secondary to [place]. ALT is WNL. AST is mildly elevated  Generalized weakness: likely secondary to [name].    DISCHARGE CONDITIONS:  Stable CONSULTS OBTAINED:   DRUG ALLERGIES:  No Known Allergies DISCHARGE MEDICATIONS:   Allergies as of 01/12/2020   No Known Allergies     Medication List    TAKE these medications   ascorbic acid 500 MG tablet Commonly known as: VITAMIN C Take 1 tablet (500 mg total) by mouth daily. Start taking on: Jan 13, 2020   zinc sulfate 220 (50 Zn) MG capsule Take 1 capsule (220 mg total) by mouth daily. Start taking on: Jan 13, 2020            Durable Medical Equipment  (From admission, onward)         Start     Ordered   01/12/20 1438  For home use only DME oxygen  Once    Question Answer Comment  Length of Need 6 Months   Mode or (Route) Nasal cannula    Liters per Minute 2   Frequency Continuous (stationary and portable oxygen unit needed)   Oxygen conserving device Yes   Oxygen delivery system Gas      01/12/20 1437   01/12/20 1438  For home use only DME Walker rolling  Once    Question Answer Comment  Walker: With Stanton Wheels   Patient needs a walker to treat with the following condition Ambulatory dysfunction      01/12/20 1437         DISCHARGE INSTRUCTIONS:   DIET:  Regular diet DISCHARGE CONDITION:  Stable ACTIVITY:  Activity as tolerated OXYGEN:  Home Oxygen: Yes.    Oxygen Delivery: 2 liters/min via Patient connected to nasal cannula oxygen DISCHARGE LOCATION:  home   If you experience worsening of your admission symptoms, develop shortness of breath, life threatening emergency, suicidal or homicidal thoughts you must seek medical attention immediately by calling 911 or calling your MD immediately  if symptoms less severe.  You Must read complete instructions/literature along with all the possible adverse reactions/side effects for all the Medicines you take and that have been prescribed to you. Take any new Medicines after you have completely understood and accpet all the possible adverse reactions/side effects.   Please note  You were cared for by a hospitalist during your hospital stay. If you have any questions about your discharge medications or the  care you received while you were in the hospital after you are discharged, you can call the unit and asked to speak with the hospitalist on call if the hospitalist that took care of you is not available. Once you are discharged, your primary care physician will handle any further medical issues. Please note that NO REFILLS for any discharge medications will be authorized once you are discharged, as it is imperative that you return to your primary care physician (or establish a relationship with a primary care physician if you do not have one) for your aftercare needs  so that they can reassess your need for medications and monitor your lab values.    On the day of Discharge:  VITAL SIGNS:  Blood pressure (!) 141/76, pulse 78, temperature 98.2 F (36.8 C), resp. rate 17, height 6\' 1"  (1.854 m), weight 81.6 kg, SpO2 97 %. PHYSICAL EXAMINATION:  GENERAL:  74 y.o.-year-old patient lying in the bed with no acute distress.  EYES: Pupils equal, round, reactive to light and accommodation. No scleral icterus. Extraocular muscles intact.  HEENT: Head atraumatic, normocephalic. Oropharynx and nasopharynx clear.  NECK:  Supple, no jugular venous distention. No thyroid enlargement, no tenderness.  LUNGS: Normal breath sounds bilaterally, no wheezing, rales,rhonchi or crepitation. No use of accessory muscles of respiration.  CARDIOVASCULAR: S1, S2 normal. No murmurs, rubs, or gallops.  ABDOMEN: Soft, non-tender, non-distended. Bowel sounds present. No organomegaly or mass.  EXTREMITIES: No pedal edema, cyanosis, or clubbing.  NEUROLOGIC: Cranial nerves II through XII are intact. Muscle strength 5/5 in all extremities. Sensation intact. Gait not checked.  PSYCHIATRIC: The patient is alert and oriented x 3.  SKIN: No obvious rash, lesion, or ulcer.  DATA REVIEW:   CBC Recent Labs  Lab 01/12/20 0448  WBC 3.9*  HGB 13.7  HCT 39.4  PLT 100*    Chemistries  Recent Labs  Lab 01/12/20 0448  NA 135  K 4.2  CL 105  CO2 23  GLUCOSE 139*  BUN 24*  CREATININE 0.74  CALCIUM 8.0*  MG 2.1  AST 36  ALT 18  ALKPHOS 38  BILITOT 0.7     Outpatient follow-up Follow-up [place], [facility]. Call in 1 week.   Specialty: General Practice Why: Office can't schedule appointment until nurse at office speaks with patient via telephone. Nurse will call you at home. Nurse number [phone] Contact information: [address] [place] [address] [phone]            Management plans discussed with the patient, family and they are in  agreement.  CODE STATUS: Full Code   TOTAL TIME TAKING CARE OF THIS PATIENT: 45 minutes.    Max Sane M.D on 01/12/2020 at 3:44 PM  [facility]   CC: Primary care physician; [facility] [place]   Note: This dictation was prepared with Dragon dictation along with smaller phrase technology. Any transcriptional errors that result from this process are unintentional.

## 2020-01-12 NOTE — TOC Transition Note (Signed)
Transition of Care [facility]) - CM/SW Discharge Note   Patient Details  Name: Tito Parkhouse MRN: [id number] Date of Birth: [birth year]  Transition of Care [facility]) CM/SW Contact:  Shelbie Hutching, RN Phone Number: 01/12/2020, 2:52 PM   Clinical Narrative:    Patient now qualifies for home O2, with walking this afternoon patient's oxygen saturation dropped down below 80%.  Cephus Shelling with the [place] has been contacted - order and demographics faxed to her - she will get the oxygen ordered and delivered.  Patient also requesting a walker- message sent to the [place] social worker.     Final next level of care: Home/Self Care Barriers to Discharge: Barriers Resolved   Patient Goals and CMS Choice Patient states their goals for this hospitalization and ongoing recovery are:: Patient would like to go home and finish his recovery there CMS Medicare.gov Compare Post Acute Care list provided to:: Patient Choice offered to / list presented to : Patient  Discharge Placement                       Discharge Plan and Services   Discharge Planning Services: CM Consult Post Acute Care Choice: Durable Medical Equipment          DME Arranged: Oxygen DME Agency: [place] Date DME Agency Contacted: 01/12/20 Time DME Agency Contacted: [id number] Representative spoke with at DME Agency: Cephus Shelling            Social Determinants of Health (SDOH) Interventions     Readmission Risk Interventions No flowsheet data found.

## 2020-01-12 NOTE — Progress Notes (Signed)
OT Cancellation Note  Patient Details Name: Geoffrey Wells MRN: [id number] DOB: [birth year]   Cancelled Treatment:    Reason Eval/Treat Not Completed: Other (comment). Thank you for the OT consult. Order received and chart reviewed. Per chart, pt preparing for DC home with wife to pick pt up this pm. Per RN, pt moving well in room on RA. Does not demonstrate acute OT needs at this time. Will complete OT order.  Shara Blazing, M.S., OTR/L Ascom: [phone] 01/12/20, 2:25 PM

## 2020-01-12 NOTE — Discharge Instructions (Addendum)
You are scheduled for an outpatient infusion of Remdesivir at 10am on Thursday 5/13, Friday 5/14, and Saturday 5/15.  Please report to [facility] at [address]  Drive to the security guard and tell them you are here for an infusion. They will direct you to the front entrance where we will come and get you.  For questions call [phone].  Thanks   COVID-19: Quarantine vs. Isolation QUARANTINE keeps someone who was in close contact with someone who has COVID-19 away from others. If you had close contact with a person who has COVID-19  Stay home until 14 days after your last contact.  Check your temperature twice a day and watch for symptoms of COVID-19.  If possible, stay away from people who are at higher-risk for getting very sick from COVID-19. ISOLATION keeps someone who is sick or tested positive for COVID-19 without symptoms away from others, even in their own home. If you are sick and think or know you have COVID-19  Stay home until after ? At least 10 days since symptoms first appeared and ? At least 24 hours with no fever without fever-reducing medication and ? Symptoms have improved If you tested positive for COVID-19 but do not have symptoms  Stay home until after ? 10 days have passed since your positive test If you live with others, stay in a specific "sick room" or area and away from other people or animals, including pets. Use a separate bathroom, if available. [facility] 03/22/2017 This information is not intended to replace advice given to you by your health care provider. Make sure you discuss any questions you have with your health care provider. Document Revised: 08/05/2019 Document Reviewed: 08/05/2019 Elsevier Patient Education  [place].  COVID-19 Frequently Asked Questions COVID-19 (coronavirus disease) is an infection that is caused by a large family of viruses. Some viruses cause illness in people and others cause illness  in animals like camels, cats, and bats. In some cases, the viruses that cause illness in animals can spread to humans. Where did the coronavirus come from? In December 2019, [place] told the [facility]) of several cases of lung disease (human respiratory illness). These cases were linked to an open seafood and livestock market in the city of [place]. The link to the seafood and livestock market suggests that the virus may have spread from animals to humans. However, since that first outbreak in December, the virus has also been shown to spread from person to person. What is the name of the disease and the virus? Disease name Early on, this disease was called novel coronavirus. This is because scientists determined that the disease was caused by a new (novel) respiratory virus. The World Health Organization [facility]) has now named the disease COVID-19, or coronavirus disease. Virus name The virus that causes the disease is called severe acute respiratory syndrome coronavirus 2 (SARS-CoV-2). More information on disease and virus naming World Health Organization [facility]): www.who.int/emergencies/diseases/novel-coronavirus-2019/technical-guidance/naming-the-coronavirus-disease-(covid-2019)-and-the-virus-that-causes-it Who is at risk for complications from coronavirus disease? Some people may be at higher risk for complications from coronavirus disease. This includes older adults and people who have chronic diseases, such as heart disease, diabetes, and lung disease. If you are at higher risk for complications, take these extra precautions:  Stay home as much as possible.  Avoid social gatherings and travel.  Avoid close contact with others. Stay at least 6 ft (2 m) away from others, if possible.  Wash your hands often with soap  and water for at least 20 seconds.  Avoid touching your face, mouth, nose, or eyes.  Keep supplies on hand at home, such as food, medicine, and cleaning  supplies.  If you must go out in public, wear a cloth face covering or face mask. Make sure your mask covers your nose and mouth. How does coronavirus disease spread? The virus that causes coronavirus disease spreads easily from person to person (is contagious). You may catch the virus by:  Breathing in droplets from an infected person. Droplets can be spread by a person breathing, speaking, singing, coughing, or sneezing.  Touching something, like a table or a doorknob, that was exposed to the virus (contaminated) and then touching your mouth, nose, or eyes. Can I get the virus from touching surfaces or objects? There is still a lot that we do not know about the virus that causes coronavirus disease. Scientists are basing a lot of information on what they know about similar viruses, such as:  Viruses cannot generally survive on surfaces for long. They need a human body (host) to survive.  It is more likely that the virus is spread by close contact with people who are sick (direct contact), such as through: ? Shaking hands or hugging. ? Breathing in respiratory droplets that travel through the air. Droplets can be spread by a person breathing, speaking, singing, coughing, or sneezing.  It is less likely that the virus is spread when a person touches a surface or object that has the virus on it (indirect contact). The virus may be able to enter the body if the person touches a surface or object and then touches his or her face, eyes, nose, or mouth. Can a person spread the virus without having symptoms of the disease? It may be possible for the virus to spread before a person has symptoms of the disease, but this is most likely not the main way the virus is spreading. It is more likely for the virus to spread by being in close contact with people who are sick and breathing in the respiratory droplets spread by a person breathing, speaking, singing, coughing, or sneezing. What are the symptoms of  coronavirus disease? Symptoms vary from person to person and can range from mild to severe. Symptoms may include:  Fever or chills.  Cough.  Difficulty breathing or feeling short of breath.  Headaches, body aches, or muscle aches.  Runny or stuffy (congested) nose.  Sore throat.  New loss of taste or smell.  Nausea, vomiting, or diarrhea. These symptoms can appear anywhere from 2 to 14 days after you have been exposed to the virus. Some people may not have any symptoms. If you develop symptoms, call your health care provider. People with severe symptoms may need hospital care. Should I be tested for this virus? Your health care provider will decide whether to test you based on your symptoms, history of exposure, and your risk factors. How does a health care provider test for this virus? Health care providers will collect samples to send for testing. Samples may include:  Taking a swab of fluid from the back of your nose and throat, your nose, or your throat.  Taking fluid from the lungs by having you cough up mucus (sputum) into a sterile cup.  Taking a blood sample. Is there a treatment or vaccine for this virus? Currently, there is no vaccine to prevent coronavirus disease. Also, there are no medicines like antibiotics or antivirals to treat the virus. A  person who becomes sick is given supportive care, which means rest and fluids. A person may also relieve his or her symptoms by using over-the-counter medicines that treat sneezing, coughing, and runny nose. These are the same medicines that a person takes for the common cold. If you develop symptoms, call your health care provider. People with severe symptoms may need hospital care. What can I do to protect myself and my family from this virus?     You can protect yourself and your family by taking the same actions that you would take to prevent the spread of other viruses. Take the following actions:  Wash your hands often  with soap and water for at least 20 seconds. If soap and water are not available, use alcohol-based hand sanitizer.  Avoid touching your face, mouth, nose, or eyes.  Cough or sneeze into a tissue, sleeve, or elbow. Do not cough or sneeze into your hand or the air. ? If you cough or sneeze into a tissue, throw it away immediately and wash your hands.  Disinfect objects and surfaces that you frequently touch every day.  Stay away from people who are sick.  Avoid going out in public, follow guidance from your state and local health authorities.  Avoid crowded indoor spaces. Stay at least 6 ft (2 m) away from others.  If you must go out in public, wear a cloth face covering or face mask. Make sure your mask covers your nose and mouth.  Stay home if you are sick, except to get medical care. Call your health care provider before you get medical care. Your health care provider will tell you how long to stay home.  Make sure your vaccines are up to date. Ask your health care provider what vaccines you need. What should I do if I need to travel? Follow travel recommendations from your local health authority, the CDC, and WHO. Travel information and advice  Centers for Disease Control and Prevention (CDC): BodyEditor.hu  World Health Organization [facility]): ThirdIncome.ca Know the risks and take action to protect your health  You are at higher risk of getting coronavirus disease if you are traveling to areas with an outbreak or if you are exposed to travelers from areas with an outbreak.  Wash your hands often and practice good hygiene to lower the risk of catching or spreading the virus. What should I do if I am sick? General instructions to stop the spread of infection  Wash your hands often with soap and water for at least 20 seconds. If soap and water are not available, use alcohol-based hand  sanitizer.  Cough or sneeze into a tissue, sleeve, or elbow. Do not cough or sneeze into your hand or the air.  If you cough or sneeze into a tissue, throw it away immediately and wash your hands.  Stay home unless you must get medical care. Call your health care provider or local health authority before you get medical care.  Avoid public areas. Do not take public transportation, if possible.  If you can, wear a mask if you must go out of the house or if you are in close contact with someone who is not sick. Make sure your mask covers your nose and mouth. Keep your home clean  Disinfect objects and surfaces that are frequently touched every day. This may include: ? Counters and tables. ? Doorknobs and light switches. ? Sinks and faucets. ? Electronics such as phones, remote controls, keyboards, computers, and tablets.  Wash dishes in hot, soapy water or use a dishwasher. Air-dry your dishes.  Wash laundry in hot water. Prevent infecting other household members  Let healthy household members care for children and pets, if possible. If you have to care for children or pets, wash your hands often and wear a mask.  Sleep in a different bedroom or bed, if possible.  Do not share personal items, such as razors, toothbrushes, deodorant, combs, brushes, towels, and washcloths. Where to find more information Centers for Disease Control and Prevention (CDC)  Information and news updates: https://www.butler-gonzalez.com/ World Health Organization [facility])  Information and news updates: MissExecutive.com.ee  Coronavirus health topic: https://www.castaneda.info/  Questions and answers on COVID-19: OpportunityDebt.at  Global tracker: who.sprinklr.com American Academy of Pediatrics (AAP)  Information for families:  www.healthychildren.org/English/health-issues/conditions/chest-lungs/Pages/2019-Novel-Coronavirus.aspx The coronavirus situation is changing rapidly. Check your local health authority website or the CDC and [facility] websites for updates and news. When should I contact a health care provider?  Contact your health care provider if you have symptoms of an infection, such as fever or cough, and you: ? Have been near anyone who is known to have coronavirus disease. ? Have come into contact with a person who is suspected to have coronavirus disease. ? Have traveled to an area where there is an outbreak of COVID-19. When should I get emergency medical care?  Get help right away by calling your local emergency services (911 in the [place] if you have: ? Trouble breathing. ? Pain or pressure in your chest. ? Confusion. ? Blue-tinged lips and fingernails. ? Difficulty waking from sleep. ? Symptoms that get worse. Let the emergency medical personnel know if you think you have coronavirus disease. Summary  A new respiratory virus is spreading from person to person and causing COVID-19 (coronavirus disease).  The virus that causes COVID-19 appears to spread easily. It spreads from one person to another through droplets from breathing, speaking, singing, coughing, or sneezing.  Older adults and those with chronic diseases are at higher risk of disease. If you are at higher risk for complications, take extra precautions.  There is currently no vaccine to prevent coronavirus disease. There are no medicines, such as antibiotics or antivirals, to treat the virus.  You can protect yourself and your family by washing your hands often, avoiding touching your face, and covering your coughs and sneezes. This information is not intended to replace advice given to you by your health care provider. Make sure you discuss any questions you have with your health care provider. Document Revised: 06/18/2019 Document  Reviewed: 12/15/2018 Elsevier Patient Education  [place] Under Monitoring Name: [facility]  Location: [place] [address]   Infection Prevention Recommendations for Individuals Confirmed to have, or Being Evaluated for, 2019 Novel Coronavirus (COVID-19) Infection Who Receive Care at Home  Individuals who are confirmed to have, or are being evaluated for, COVID-19 should follow the prevention steps below until a healthcare provider or local or state health department says they can return to normal activities.  Stay home except to get medical care You should restrict activities outside your home, except for getting medical care. Do not go to work, school, or public areas, and do not use public transportation or taxis.  Call ahead before visiting your doctor Before your medical appointment, call the healthcare provider and tell them that you have, or are being evaluated for, COVID-19 infection. This will help the healthcare provider's office take steps to keep other  people from getting infected. Ask your healthcare provider to call the local or state health department.  Monitor your symptoms Seek prompt medical attention if your illness is worsening (e.g., difficulty breathing). Before going to your medical appointment, call the healthcare provider and tell them that you have, or are being evaluated for, COVID-19 infection. Ask your healthcare provider to call the local or state health department.  Wear a facemask You should wear a facemask that covers your nose and mouth when you are in the same room with other people and when you visit a healthcare provider. People who live with or visit you should also wear a facemask while they are in the same room with you.  Separate yourself from other people in your home As much as possible, you should stay in a different room from other people in your home. Also, you should use a separate bathroom,  if available.  Avoid sharing household items You should not share dishes, drinking glasses, cups, eating utensils, towels, bedding, or other items with other people in your home. After using these items, you should wash them thoroughly with soap and water.  Cover your coughs and sneezes Cover your mouth and nose with a tissue when you cough or sneeze, or you can cough or sneeze into your sleeve. Throw used tissues in a lined trash can, and immediately wash your hands with soap and water for at least 20 seconds or use an alcohol-based hand rub.  Wash your [facility] your hands often and thoroughly with soap and water for at least 20 seconds. You can use an alcohol-based hand sanitizer if soap and water are not available and if your hands are not visibly dirty. Avoid touching your eyes, nose, and mouth with unwashed hands.   Prevention Steps for Caregivers and Household Members of Individuals Confirmed to have, or Being Evaluated for, COVID-19 Infection Being Cared for in the Home  If you live with, or provide care at home for, a person confirmed to have, or being evaluated for, COVID-19 infection please follow these guidelines to prevent infection:  Follow healthcare provider's instructions Make sure that you understand and can help the patient follow any healthcare provider instructions for all care.  Provide for the patient's basic needs You should help the patient with basic needs in the home and provide support for getting groceries, prescriptions, and other personal needs.  Monitor the patient's symptoms If they are getting sicker, call his or her medical provider and tell them that the patient has, or is being evaluated for, COVID-19 infection. This will help the healthcare provider's office take steps to keep other people from getting infected. Ask the healthcare provider to call the local or state health department.  Limit the number of people who have contact with the  patient  If possible, have only one caregiver for the patient.  Other household members should stay in another home or place of residence. If this is not possible, they should stay  in another room, or be separated from the patient as much as possible. Use a separate bathroom, if available.  Restrict visitors who do not have an essential need to be in the home.  Keep older adults, very young children, and other sick people away from the patient Keep older adults, very young children, and those who have compromised immune systems or chronic health conditions away from the patient. This includes people with chronic heart, lung, or kidney conditions, diabetes, and cancer.  Ensure good ventilation Make  sure that shared spaces in the home have good air flow, such as from an air conditioner or an opened window, weather permitting.  Wash your hands often  Wash your hands often and thoroughly with soap and water for at least 20 seconds. You can use an alcohol based hand sanitizer if soap and water are not available and if your hands are not visibly dirty.  Avoid touching your eyes, nose, and mouth with unwashed hands.  Use disposable paper towels to dry your hands. If not available, use dedicated cloth towels and replace them when they become wet.  Wear a facemask and gloves  Wear a disposable facemask at all times in the room and gloves when you touch or have contact with the patient's blood, body fluids, and/or secretions or excretions, such as sweat, saliva, sputum, nasal mucus, vomit, urine, or feces.  Ensure the mask fits over your nose and mouth tightly, and do not touch it during use.  Throw out disposable facemasks and gloves after using them. Do not reuse.  Wash your hands immediately after removing your facemask and gloves.  If your personal clothing becomes contaminated, carefully remove clothing and launder. Wash your hands after handling contaminated clothing.  Place all used  disposable facemasks, gloves, and other waste in a lined container before disposing them with other household waste.  Remove gloves and wash your hands immediately after handling these items.  Do not share dishes, glasses, or other household items with the patient  Avoid sharing household items. You should not share dishes, drinking glasses, cups, eating utensils, towels, bedding, or other items with a patient who is confirmed to have, or being evaluated for, COVID-19 infection.  After the person uses these items, you should wash them thoroughly with soap and water.  Wash laundry thoroughly  Immediately remove and wash clothes or bedding that have blood, body fluids, and/or secretions or excretions, such as sweat, saliva, sputum, nasal mucus, vomit, urine, or feces, on them.  Wear gloves when handling laundry from the patient.  Read and follow directions on labels of laundry or clothing items and detergent. In general, wash and dry with the warmest temperatures recommended on the label.  Clean all areas the individual has used often  Clean all touchable surfaces, such as counters, tabletops, doorknobs, bathroom fixtures, toilets, phones, keyboards, tablets, and bedside tables, every day. Also, clean any surfaces that may have blood, body fluids, and/or secretions or excretions on them.  Wear gloves when cleaning surfaces the patient has come in contact with.  Use a diluted bleach solution (e.g., dilute bleach with 1 part bleach and 10 parts water) or a household disinfectant with a label that says EPA-registered for coronaviruses. To make a bleach solution at home, add 1 tablespoon of bleach to 1 quart (4 cups) of water. For a larger supply, add  cup of bleach to 1 gallon (16 cups) of water.  Read labels of cleaning products and follow recommendations provided on product labels. Labels contain instructions for safe and effective use of the cleaning product including precautions you should  take when applying the product, such as wearing gloves or eye protection and making sure you have good ventilation during use of the product.  Remove gloves and wash hands immediately after cleaning.  Monitor yourself for signs and symptoms of illness Caregivers and household members are considered close contacts, should monitor their health, and will be asked to limit movement outside of the home to the extent possible. Follow the monitoring  steps for close contacts listed on the symptom monitoring form.   ? If you have additional questions, contact your local health department or call the epidemiologist on call at [phone] (available 24/7). ? This guidance is subject to change. For the most up-to-date guidance from CDC, please refer to their website: https://www.taylor.biz/: How to Protect Yourself and Others Know how it spreads  There is currently no vaccine to prevent coronavirus disease 2019 (COVID-19).  The best way to prevent illness is to avoid being exposed to this virus.  The virus is thought to spread mainly from person-to-person. ? Between people who are in close contact with one another (within about 6 feet). ? Through respiratory droplets produced when an infected person coughs, sneezes or talks. ? These droplets can land in the mouths or noses of people who are nearby or possibly be inhaled into the lungs. ? COVID-19 may be spread by people who are not showing symptoms. Everyone should Clean your hands often  Wash your hands often with soap and water for at least 20 seconds especially after you have been in a public place, or after blowing your nose, coughing, or sneezing.  If soap and water are not readily available, use a hand sanitizer that contains at least 60% alcohol. Cover all surfaces of your hands and rub them together until they feel dry.  Avoid touching your eyes, nose, and mouth with unwashed  hands. Avoid close contact  Limit contact with others as much as possible.  Avoid close contact with people who are sick.  Put distance between yourself and other people. ? Remember that some people without symptoms may be able to spread virus. ? This is especially important for people who are at higher risk of getting very GainPain.com.cy Cover your mouth and nose with a mask when around others  You could spread COVID-19 to others even if you do not feel sick.  Everyone should wear a mask in public settings and when around people not living in their household, especially when social distancing is difficult to maintain. ? Masks should not be placed on young children under age 76, anyone who has trouble breathing, or is unconscious, incapacitated or otherwise unable to remove the mask without assistance.  The mask is meant to protect other people in case you are infected.  Do NOT use a facemask meant for a Dietitian.  Continue to keep about 6 feet between yourself and others. The mask is not a substitute for social distancing. Cover coughs and sneezes  Always cover your mouth and nose with a tissue when you cough or sneeze or use the inside of your elbow.  Throw used tissues in the trash.  Immediately wash your hands with soap and water for at least 20 seconds. If soap and water are not readily available, clean your hands with a hand sanitizer that contains at least 60% alcohol. Clean and disinfect  Clean AND disinfect frequently touched surfaces daily. This includes tables, doorknobs, light switches, countertops, handles, desks, phones, keyboards, toilets, faucets, and sinks. RackRewards.fr  If surfaces are dirty, clean them: Use detergent or soap and water prior to disinfection.  Then, use a household disinfectant. You can see a list  of EPA-registered household disinfectants here. Geoffrey Wells 05/[birth year]/2019 This information is not intended to replace advice given to you by your health care provider. Make sure you discuss any questions you have with your health care provider. Document Revised: 05/13/2019 Document Reviewed: 03/11/2019 Elsevier Patient Education  [place].  COVID-19 COVID-19 is a respiratory infection that is caused by a virus called severe acute respiratory syndrome coronavirus 2 (SARS-CoV-2). The disease is also known as coronavirus disease or novel coronavirus. In some people, the virus may not cause any symptoms. In others, it may cause a serious infection. The infection can get worse quickly and can lead to complications, such as:  Pneumonia, or infection of the lungs.  Acute respiratory distress syndrome or ARDS. This is a condition in which fluid build-up in the lungs prevents the lungs from filling with air and passing oxygen into the blood.  Acute respiratory failure. This is a condition in which there is not enough oxygen passing from the lungs to the body or when carbon dioxide is not passing from the lungs out of the body.  Sepsis or septic shock. This is a serious bodily reaction to an infection.  Blood clotting problems.  Secondary infections due to bacteria or fungus.  Organ failure. This is when your body's organs stop working. The virus that causes COVID-19 is contagious. This means that it can spread from person to person through droplets from coughs and sneezes (respiratory secretions). What are the causes? This illness is caused by a virus. You may catch the virus by:  Breathing in droplets from an infected person. Droplets can be spread by a person breathing, speaking, singing, coughing, or sneezing.  Touching something, like a table or a doorknob, that was exposed to the virus (contaminated) and then touching your mouth, nose, or eyes. What increases the risk? Risk  for infection You are more likely to be infected with this virus if you:  Are within 6 feet (2 meters) of a person with COVID-19.  Provide care for or live with a person who is infected with COVID-19.  Spend time in crowded indoor spaces or live in shared housing. Risk for serious illness You are more likely to become seriously ill from the virus if you:  Are 44 years of age or older. The higher your age, the more you are at risk for serious illness.  Live in a nursing home or long-term care facility.  Have cancer.  Have a long-term (chronic) disease such as: ? Chronic lung disease, including chronic obstructive pulmonary disease or asthma. ? A long-term disease that lowers your body's ability to fight infection (immunocompromised). ? Heart disease, including heart failure, a condition in which the arteries that lead to the heart become narrow or blocked (coronary artery disease), a disease which makes the heart muscle thick, weak, or stiff (cardiomyopathy). ? Diabetes. ? Chronic kidney disease. ? Sickle cell disease, a condition in which red blood cells have an abnormal "sickle" shape. ? Liver disease.  Are obese. What are the signs or symptoms? Symptoms of this condition can range from mild to severe. Symptoms may appear any time from 2 to 14 days after being exposed to the virus. They include:  A fever or chills.  A cough.  Difficulty breathing.  Headaches, body aches, or muscle aches.  Runny or stuffy (congested) nose.  A sore throat.  New loss of taste or smell. Some people may also have stomach problems, such as nausea, vomiting, or diarrhea. Other people may not have any symptoms of COVID-19. How is this diagnosed? This condition may be diagnosed based on:  Your signs and symptoms, especially if: ? You live in an area with a COVID-19 outbreak. ? You recently traveled to or from an area where the virus is common. ? You  provide care for or live with a person  who was diagnosed with COVID-19. ? You were exposed to a person who was diagnosed with COVID-19.  A physical exam.  Lab tests, which may include: ? Taking a sample of fluid from the back of your nose and throat (nasopharyngeal fluid), your nose, or your throat using a swab. ? A sample of mucus from your lungs (sputum). ? Blood tests.  Imaging tests, which may include, X-rays, CT scan, or ultrasound. How is this treated? At present, there is no medicine to treat COVID-19. Medicines that treat other diseases are being used on a trial basis to see if they are effective against COVID-19. Your health care provider will talk with you about ways to treat your symptoms. For most people, the infection is mild and can be managed at home with rest, fluids, and over-the-counter medicines. Treatment for a serious infection usually takes places in a hospital intensive care unit (ICU). It may include one or more of the following treatments. These treatments are given until your symptoms improve.  Receiving fluids and medicines through an IV.  Supplemental oxygen. Extra oxygen is given through a tube in the nose, a face mask, or a hood.  Positioning you to lie on your stomach (prone position). This makes it easier for oxygen to get into the lungs.  Continuous positive airway pressure (CPAP) or bi-level positive airway pressure (BPAP) machine. This treatment uses mild air pressure to keep the airways open. A tube that is connected to a motor delivers oxygen to the body.  Ventilator. This treatment moves air into and out of the lungs by using a tube that is placed in your windpipe.  Tracheostomy. This is a procedure to create a hole in the neck so that a breathing tube can be inserted.  Extracorporeal membrane oxygenation (ECMO). This procedure gives the lungs a chance to recover by taking over the functions of the heart and lungs. It supplies oxygen to the body and removes carbon dioxide. Follow these  instructions at home: Lifestyle  If you are sick, stay home except to get medical care. Your health care provider will tell you how long to stay home. Call your health care provider before you go for medical care.  Rest at home as told by your health care provider.  Do not use any products that contain nicotine or tobacco, such as cigarettes, e-cigarettes, and chewing tobacco. If you need help quitting, ask your health care provider.  Return to your normal activities as told by your health care provider. Ask your health care provider what activities are safe for you. General instructions  Take over-the-counter and prescription medicines only as told by your health care provider.  Drink enough fluid to keep your urine pale yellow.  Keep all follow-up visits as told by your health care provider. This is important. How is this prevented?  There is no vaccine to help prevent COVID-19 infection. However, there are steps you can take to protect yourself and others from this virus. To protect yourself:   Do not travel to areas where COVID-19 is a risk. The areas where COVID-19 is reported change often. To identify high-risk areas and travel restrictions, check the CDC travel website: FatFares.com.br  If you live in, or must travel to, an area where COVID-19 is a risk, take precautions to avoid infection. ? Stay away from people who are sick. ? Wash your hands often with soap and water for 20 seconds. If soap and water  are not available, use an alcohol-based hand sanitizer. ? Avoid touching your mouth, face, eyes, or nose. ? Avoid going out in public, follow guidance from your state and local health authorities. ? If you must go out in public, wear a cloth face covering or face mask. Make sure your mask covers your nose and mouth. ? Avoid crowded indoor spaces. Stay at least 6 feet (2 meters) away from others. ? Disinfect objects and surfaces that are frequently touched every day.  This may include:  Counters and tables.  Doorknobs and light switches.  Sinks and faucets.  Electronics, such as phones, remote controls, keyboards, computers, and tablets. To protect others: If you have symptoms of COVID-19, take steps to prevent the virus from spreading to others.  If you think you have a COVID-19 infection, contact your health care provider right away. Tell your health care team that you think you may have a COVID-19 infection.  Stay home. Leave your house only to seek medical care. Do not use public transport.  Do not travel while you are sick.  Wash your hands often with soap and water for 20 seconds. If soap and water are not available, use alcohol-based hand sanitizer.  Stay away from other members of your household. Let healthy household members care for children and pets, if possible. If you have to care for children or pets, wash your hands often and wear a mask. If possible, stay in your own room, separate from others. Use a different bathroom.  Make sure that all people in your household wash their hands well and often.  Cough or sneeze into a tissue or your sleeve or elbow. Do not cough or sneeze into your hand or into the air.  Wear a cloth face covering or face mask. Make sure your mask covers your nose and mouth. Where to find more information  Centers for Disease Control and Prevention: PurpleGadgets.be  World Health Organization: https://www.castaneda.info/ Contact a health care provider if:  You live in or have traveled to an area where COVID-19 is a risk and you have symptoms of the infection.  You have had contact with someone who has COVID-19 and you have symptoms of the infection. Get help right away if:  You have trouble breathing.  You have pain or pressure in your chest.  You have confusion.  You have bluish lips and fingernails.  You have difficulty waking from sleep.  You have symptoms  that get worse. These symptoms may represent a serious problem that is an emergency. Do not wait to see if the symptoms will go away. Get medical help right away. Call your local emergency services (911 in the [place] Do not drive yourself to the hospital. Let the emergency medical personnel know if you think you have COVID-19. Summary  COVID-19 is a respiratory infection that is caused by a virus. It is also known as coronavirus disease or novel coronavirus. It can cause serious infections, such as pneumonia, acute respiratory distress syndrome, acute respiratory failure, or sepsis.  The virus that causes COVID-19 is contagious. This means that it can spread from person to person through droplets from breathing, speaking, singing, coughing, or sneezing.  You are more likely to develop a serious illness if you are 77 years of age or older, have a weak immune system, live in a nursing home, or have chronic disease.  There is no medicine to treat COVID-19. Your health care provider will talk with you about ways to treat your symptoms.  Take steps to protect yourself and others from infection. Wash your hands often and disinfect objects and surfaces that are frequently touched every day. Stay away from people who are sick and wear a mask if you are sick. This information is not intended to replace advice given to you by your health care provider. Make sure you discuss any questions you have with your health care provider. Document Revised: 06/18/2019 Document Reviewed: 09/24/2018 Elsevier Patient Education  [place].

## 2020-01-12 NOTE — Progress Notes (Signed)
PT Cancellation Note  Patient Details Name: Geoffrey Wells MRN: [id number] DOB: 1945/11/03   Cancelled Treatment:    Reason Eval/Treat Not Completed: Other (comment). Discussed with RN who states he is performing mobility in room with stand-by and sometimes independence. No further needs at this time. If needs change, please re-consult PT. Thank you   Lacreasha Hinds 01/12/2020, 10:00 AM Greggory Stallion, PT, DPT [phone]

## 2020-01-13 ENCOUNTER — Ambulatory Visit (HOSPITAL_COMMUNITY)
Admit: 2020-01-13 | Discharge: 2020-01-13 | Disposition: A | Discharge status: Home / Self Care | Payer: No Typology Code available for payment source | Source: Ambulatory Visit | Attending: Pulmonary Disease | Admitting: Pulmonary Disease

## 2020-01-13 DIAGNOSIS — U071 COVID-19: Secondary | ICD-10-CM | POA: Diagnosis not present

## 2020-01-13 DIAGNOSIS — J1282 Pneumonia due to coronavirus disease 2019: Secondary | ICD-10-CM | POA: Insufficient documentation

## 2020-01-13 MED ORDER — FAMOTIDINE IN NACL 20-0.9 MG/50ML-% IV SOLN: 20.0000 mg | Freq: Once | INTRAVENOUS | Status: DC | PRN

## 2020-01-13 MED ORDER — DIPHENHYDRAMINE HCL 50 MG/ML IJ SOLN: 50.0000 mg | Freq: Once | INTRAMUSCULAR | Status: DC | PRN

## 2020-01-13 MED ORDER — SODIUM CHLORIDE 0.9 % IV SOLN: INTRAVENOUS | Status: DC | PRN

## 2020-01-13 MED ORDER — SODIUM CHLORIDE 0.9 % IV SOLN
100.0000 mg | Freq: Once | INTRAVENOUS | Status: AC
  Administered 2020-01-13: 100 mg via INTRAVENOUS
  Filled 2020-01-13: qty 20

## 2020-01-13 MED ORDER — ALBUTEROL SULFATE HFA 108 (90 BASE) MCG/ACT IN AERS: 2.0000 | INHALATION_SPRAY | Freq: Once | RESPIRATORY_TRACT | Status: DC | PRN

## 2020-01-13 MED ORDER — METHYLPREDNISOLONE SODIUM SUCC 125 MG IJ SOLR: 125.0000 mg | Freq: Once | INTRAMUSCULAR | Status: DC | PRN

## 2020-01-13 MED ORDER — EPINEPHRINE 0.3 MG/0.3ML IJ SOAJ: 0.3000 mg | Freq: Once | INTRAMUSCULAR | Status: DC | PRN

## 2020-01-13 NOTE — Progress Notes (Signed)
  Diagnosis: COVID-19  Physician: Dr. Joya Gaskins  Procedure: [facility] Med: remdesivir infusion - Provided patient with remdesivir fact sheet for patients, parents and caregivers prior to infusion.  Complications: No immediate complications noted.  Discharge: Discharged home   Geoffrey Wells 01/13/2020

## 2020-01-13 NOTE — Discharge Instructions (Signed)

## 2020-01-14 ENCOUNTER — Ambulatory Visit (HOSPITAL_COMMUNITY)
Admit: 2020-01-14 | Discharge: 2020-01-14 | Disposition: A | Discharge status: Home / Self Care | Payer: No Typology Code available for payment source | Attending: Pulmonary Disease | Admitting: Pulmonary Disease

## 2020-01-14 ENCOUNTER — Other Ambulatory Visit: Payer: Self-pay | Admitting: Nurse Practitioner

## 2020-01-14 DIAGNOSIS — I959 Hypotension, unspecified: Secondary | ICD-10-CM

## 2020-01-14 DIAGNOSIS — U071 COVID-19: Secondary | ICD-10-CM

## 2020-01-14 MED ORDER — METHYLPREDNISOLONE SODIUM SUCC 125 MG IJ SOLR: 125.0000 mg | Freq: Once | INTRAMUSCULAR | Status: DC | PRN

## 2020-01-14 MED ORDER — ONDANSETRON HCL 4 MG/5ML PO SOLN
4.0000 mg | Freq: Once | ORAL | Status: DC
  Filled 2020-01-14: qty 5

## 2020-01-14 MED ORDER — ONDANSETRON HCL 4 MG/2ML IJ SOLN
4.0000 mg | Freq: Once | INTRAMUSCULAR | Status: AC
  Administered 2020-01-14: 4 mg via INTRAVENOUS
  Filled 2020-01-14: qty 2

## 2020-01-14 MED ORDER — FAMOTIDINE IN NACL 20-0.9 MG/50ML-% IV SOLN: 20.0000 mg | Freq: Once | INTRAVENOUS | Status: DC | PRN

## 2020-01-14 MED ORDER — DIPHENHYDRAMINE HCL 50 MG/ML IJ SOLN: 50.0000 mg | Freq: Once | INTRAMUSCULAR | Status: DC | PRN

## 2020-01-14 MED ORDER — SODIUM CHLORIDE 0.9 % IV SOLN: INTRAVENOUS | Status: DC

## 2020-01-14 MED ORDER — ALBUTEROL SULFATE HFA 108 (90 BASE) MCG/ACT IN AERS: 2.0000 | INHALATION_SPRAY | Freq: Once | RESPIRATORY_TRACT | Status: DC | PRN

## 2020-01-14 MED ORDER — EPINEPHRINE 0.3 MG/0.3ML IJ SOAJ: 0.3000 mg | Freq: Once | INTRAMUSCULAR | Status: DC | PRN

## 2020-01-14 MED ORDER — SODIUM CHLORIDE 0.9 % IV SOLN: INTRAVENOUS | Status: DC | PRN

## 2020-01-14 MED ORDER — SODIUM CHLORIDE 0.9 % IV SOLN
100.0000 mg | Freq: Once | INTRAVENOUS | Status: AC
  Administered 2020-01-14: 100 mg via INTRAVENOUS
  Filled 2020-01-14: qty 20

## 2020-01-14 MED ORDER — ONDANSETRON HCL 4 MG PO TABS: 4.0000 mg | ORAL_TABLET | Freq: Once | ORAL | Status: DC

## 2020-01-14 NOTE — Discharge Instructions (Signed)

## 2020-01-14 NOTE — Progress Notes (Signed)
  Diagnosis: COVID-19  Physician: Dr. Joya Gaskins  Procedure: [facility] Med: remdesivir infusion - Provided patient with remdesivir fact sheet for patients, parents and caregivers prior to infusion.  Complications: Pt had low BP. Bolus given. VS stable.  Discharge: Discharged home   Acquanetta Chain 01/14/2020

## 2020-01-15 ENCOUNTER — Ambulatory Visit (HOSPITAL_COMMUNITY)
Admit: 2020-01-15 | Discharge: 2020-01-15 | Disposition: A | Discharge status: Home / Self Care | Payer: No Typology Code available for payment source | Attending: Pulmonary Disease | Admitting: Pulmonary Disease

## 2020-01-15 DIAGNOSIS — U071 COVID-19: Secondary | ICD-10-CM | POA: Diagnosis not present

## 2020-01-15 MED ORDER — METHYLPREDNISOLONE SODIUM SUCC 125 MG IJ SOLR: 125.0000 mg | Freq: Once | INTRAMUSCULAR | Status: DC | PRN

## 2020-01-15 MED ORDER — FAMOTIDINE IN NACL 20-0.9 MG/50ML-% IV SOLN: 20.0000 mg | Freq: Once | INTRAVENOUS | Status: DC | PRN

## 2020-01-15 MED ORDER — SODIUM CHLORIDE 0.9 % IV SOLN: INTRAVENOUS | Status: DC | PRN

## 2020-01-15 MED ORDER — SODIUM CHLORIDE 0.9 % IV SOLN
100.0000 mg | Freq: Once | INTRAVENOUS | Status: AC
  Administered 2020-01-15: 100 mg via INTRAVENOUS
  Filled 2020-01-15: qty 20

## 2020-01-15 MED ORDER — ALBUTEROL SULFATE HFA 108 (90 BASE) MCG/ACT IN AERS: 2.0000 | INHALATION_SPRAY | Freq: Once | RESPIRATORY_TRACT | Status: DC | PRN

## 2020-01-15 MED ORDER — DIPHENHYDRAMINE HCL 50 MG/ML IJ SOLN: 50.0000 mg | Freq: Once | INTRAMUSCULAR | Status: DC | PRN

## 2020-01-15 MED ORDER — EPINEPHRINE 0.3 MG/0.3ML IJ SOAJ: 0.3000 mg | Freq: Once | INTRAMUSCULAR | Status: DC | PRN

## 2020-01-15 NOTE — Discharge Instructions (Signed)

## 2020-01-15 NOTE — Progress Notes (Signed)
  Diagnosis: COVID-19  Physician:Dr Joya Gaskins  Procedure: [facility] Med: remdesivir infusion - Provided patient with remdesivir fact sheet for patients, parents and caregivers prior to infusion.  Complications: No immediate complications noted.  Discharge: Discharged home   Geoffrey Wells 01/15/2020

## 2020-01-18 ENCOUNTER — Emergency Department: Payer: No Typology Code available for payment source

## 2020-01-18 ENCOUNTER — Inpatient Hospital Stay
Admission: EM | Admit: 2020-01-18 | Discharge: 2020-01-29 | DRG: 177 | Disposition: A | Discharge status: Home Health | Payer: No Typology Code available for payment source | Attending: Internal Medicine | Admitting: Internal Medicine

## 2020-01-18 ENCOUNTER — Other Ambulatory Visit: Payer: Self-pay

## 2020-01-18 DIAGNOSIS — R0902 Hypoxemia: Secondary | ICD-10-CM | POA: Diagnosis present

## 2020-01-18 DIAGNOSIS — Z79899 Other long term (current) drug therapy: Secondary | ICD-10-CM | POA: Diagnosis not present

## 2020-01-18 DIAGNOSIS — I10 Essential (primary) hypertension: Secondary | ICD-10-CM | POA: Diagnosis present

## 2020-01-18 DIAGNOSIS — K5900 Constipation, unspecified: Secondary | ICD-10-CM | POA: Diagnosis not present

## 2020-01-18 DIAGNOSIS — T380X5A Adverse effect of glucocorticoids and synthetic analogues, initial encounter: Secondary | ICD-10-CM | POA: Diagnosis not present

## 2020-01-18 DIAGNOSIS — J159 Unspecified bacterial pneumonia: Secondary | ICD-10-CM | POA: Diagnosis present

## 2020-01-18 DIAGNOSIS — R569 Unspecified convulsions: Secondary | ICD-10-CM | POA: Diagnosis not present

## 2020-01-18 DIAGNOSIS — J9691 Respiratory failure, unspecified with hypoxia: Secondary | ICD-10-CM

## 2020-01-18 DIAGNOSIS — J9601 Acute respiratory failure with hypoxia: Secondary | ICD-10-CM | POA: Diagnosis not present

## 2020-01-18 DIAGNOSIS — Z87891 Personal history of nicotine dependence: Secondary | ICD-10-CM

## 2020-01-18 DIAGNOSIS — K8020 Calculus of gallbladder without cholecystitis without obstruction: Secondary | ICD-10-CM | POA: Diagnosis present

## 2020-01-18 DIAGNOSIS — E871 Hypo-osmolality and hyponatremia: Secondary | ICD-10-CM | POA: Diagnosis present

## 2020-01-18 DIAGNOSIS — J9621 Acute and chronic respiratory failure with hypoxia: Secondary | ICD-10-CM | POA: Diagnosis present

## 2020-01-18 DIAGNOSIS — J1282 Pneumonia due to coronavirus disease 2019: Secondary | ICD-10-CM | POA: Diagnosis not present

## 2020-01-18 DIAGNOSIS — U071 COVID-19: Secondary | ICD-10-CM | POA: Diagnosis not present

## 2020-01-18 DIAGNOSIS — K449 Diaphragmatic hernia without obstruction or gangrene: Secondary | ICD-10-CM | POA: Diagnosis present

## 2020-01-18 DIAGNOSIS — I2699 Other pulmonary embolism without acute cor pulmonale: Secondary | ICD-10-CM | POA: Diagnosis not present

## 2020-01-18 DIAGNOSIS — R9389 Abnormal findings on diagnostic imaging of other specified body structures: Secondary | ICD-10-CM

## 2020-01-18 DIAGNOSIS — K458 Other specified abdominal hernia without obstruction or gangrene: Secondary | ICD-10-CM | POA: Diagnosis present

## 2020-01-18 LAB — CBC WITH DIFFERENTIAL/PLATELET
Abs Immature Granulocytes: 0.08 10*3/uL — ABNORMAL HIGH (ref 0.00–0.07)
Basophils Absolute: 0 10*3/uL (ref 0.0–0.1)
Basophils Relative: 0 %
Eosinophils Absolute: 0 10*3/uL (ref 0.0–0.5)
Eosinophils Relative: 0 %
HCT: 38.7 % — ABNORMAL LOW (ref 39.0–52.0)
Hemoglobin: 13.6 g/dL (ref 13.0–17.0)
Immature Granulocytes: 1 %
Lymphocytes Relative: 7 %
Lymphs Abs: 0.5 10*3/uL — ABNORMAL LOW (ref 0.7–4.0)
MCH: 30.1 pg (ref 26.0–34.0)
MCHC: 35.1 g/dL (ref 30.0–36.0)
MCV: 85.6 fL (ref 80.0–100.0)
Monocytes Absolute: 0.4 10*3/uL (ref 0.1–1.0)
Monocytes Relative: 6 %
Neutro Abs: 6 10*3/uL (ref 1.7–7.7)
Neutrophils Relative %: 86 %
Platelets: 255 10*3/uL (ref 150–400)
RBC: 4.52 MIL/uL (ref 4.22–5.81)
RDW: 14.2 % (ref 11.5–15.5)
WBC: 7 10*3/uL (ref 4.0–10.5)
nRBC: 0 % (ref 0.0–0.2)

## 2020-01-18 LAB — BASIC METABOLIC PANEL
Anion gap: 10 (ref 5–15)
BUN: 21 mg/dL (ref 8–23)
CO2: 23 mmol/L (ref 22–32)
Calcium: 8.3 mg/dL — ABNORMAL LOW (ref 8.9–10.3)
Chloride: 100 mmol/L (ref 98–111)
Creatinine, Ser: 0.95 mg/dL (ref 0.61–1.24)
GFR calc Af Amer: >60 mL/min (ref >60)
GFR calc non Af Amer: >60 mL/min (ref >60)
Glucose, Bld: 119 mg/dL — ABNORMAL HIGH (ref 70–99)
Potassium: 3.6 mmol/L (ref 3.5–5.1)
Sodium: 133 mmol/L — ABNORMAL LOW (ref 135–145)

## 2020-01-18 MED ORDER — SODIUM CHLORIDE 0.9 % IV BOLUS
500.0000 mL | Freq: Once | INTRAVENOUS | Status: AC
  Administered 2020-01-18: 500 mL via INTRAVENOUS

## 2020-01-18 MED ORDER — ONDANSETRON 4 MG PO TBDP
4.0000 mg | ORAL_TABLET | Freq: Three times a day (TID) | ORAL | 0 refills | Status: DC | PRN
Start: 2020-01-18 — End: 2021-03-30

## 2020-01-18 MED ORDER — AMOXICILLIN-POT CLAVULANATE 875-125 MG PO TABS
1.0000 | ORAL_TABLET | Freq: Two times a day (BID) | ORAL | 0 refills | Status: DC
Start: 2020-01-18 — End: 2020-01-28

## 2020-01-18 MED ORDER — IOHEXOL 350 MG/ML SOLN
100.0000 mL | Freq: Once | INTRAVENOUS | Status: AC | PRN
  Administered 2020-01-18: 100 mL via INTRAVENOUS

## 2020-01-18 MED ORDER — SODIUM CHLORIDE 0.9 % IV SOLN
2.0000 g | Freq: Once | INTRAVENOUS | Status: AC
  Administered 2020-01-18: 2 g via INTRAVENOUS
  Filled 2020-01-18: qty 20

## 2020-01-18 NOTE — ED Provider Notes (Signed)
[facility] Emergency Department Provider Note  ____________________________________________  Time seen: Approximately 10:29 PM  I have reviewed the triage vital signs and the nursing notes.   HISTORY  Chief Complaint Shortness of Breath    Level 5 Caveat: Portions of the History and Physical including HPI and review of systems are unable to be completely obtained due to patient being a poor historian   HPI Geoffrey Wells is a 74 y.o. male with a recent diagnosis of COVID-19 8 days ago who was treated in the hospital, discharged home on 2 L nasal cannula due to persistent respiratory failure.  Family called EMS because tonight they felt that his oxygen level was lower and only about 86% on his usual home oxygen.  They also noted that the patient seems more weak than usual.  Patient reports that his shortness of breath does feel worse than before.  Denies cough or chest pain.  No pleuritic pain.  No vomiting or diarrhea.  Tolerating oral intake.      No past medical history on file.   Patient Active Problem List   Diagnosis Date Noted  . Generalized weakness   . COVID-19 01/10/2020     Past Surgical History:  Procedure Laterality Date  . HERNIA REPAIR       Prior to Admission medications   Medication Sig Start Date End Date Taking? Authorizing Provider  amoxicillin-clavulanate (AUGMENTIN) 875-125 MG tablet Take 1 tablet by mouth 2 (two) times daily. 01/18/20   Carrie Mew, MD  ascorbic acid (VITAMIN C) 500 MG tablet Take 1 tablet (500 mg total) by mouth daily. 01/13/20   Max Sane, MD  ondansetron (ZOFRAN ODT) 4 MG disintegrating tablet Take 1 tablet (4 mg total) by mouth every 8 (eight) hours as needed for nausea or vomiting. 01/18/20   Carrie Mew, MD  zinc sulfate 220 (50 Zn) MG capsule Take 1 capsule (220 mg total) by mouth daily. 01/13/20   Max Sane, MD     Allergies Patient has no known allergies.   No family history on  file.  Social History Social History   Tobacco Use  . Smoking status: Former Smoker    Packs/day: 0.50    Years: 25.00    Pack years: 12.50    Types: Cigarettes  . Smokeless tobacco: Never Used  Substance Use Topics  . Alcohol use: Yes    Alcohol/week: 7.0 standard drinks    Types: 7 Cans of beer per week  . Drug use: Not Currently    Review of Systems Level 5 Caveat: Portions of the History and Physical including HPI and review of systems are unable to be completely obtained due to patient being a poor historian   Constitutional:   No known fever.  ENT:   No rhinorrhea. Cardiovascular:   No chest pain or syncope. Respiratory:   Positive shortness of breath without cough. Gastrointestinal:   Negative for abdominal pain, vomiting and diarrhea.  Musculoskeletal:   Negative for focal pain or swelling ____________________________________________   PHYSICAL EXAM:  VITAL SIGNS: ED Triage Vitals  Enc Vitals Group     BP 01/18/20 2126 130/78     Pulse Rate 01/18/20 2126 89     Resp 01/18/20 2126 18     Temp 01/18/20 2126 98.3 F (36.8 C)     Temp Source 01/18/20 2126 Oral     SpO2 01/18/20 2126 94 %     Weight 01/18/20 2127 296 lb (134.3 kg)     Height  01/18/20 2127 6\' 1"  (1.854 m)     Head Circumference --      Peak Flow --      Pain Score 01/18/20 2127 3     Pain Loc --      Pain Edu? --      Excl. in [place]? --     Vital signs reviewed, nursing assessments reviewed.   Constitutional:   Alert and oriented. Non-toxic appearance. Eyes:   Conjunctivae are normal. EOMI. PERRL. ENT      Head:   Normocephalic and atraumatic.      Nose:   No congestion/rhinnorhea.       Mouth/Throat:   MMM, no pharyngeal erythema. No peritonsillar mass.       Neck:   No meningismus. Full ROM. Hematological/Lymphatic/Immunilogical:   No cervical lymphadenopathy. Cardiovascular:   RRR. Symmetric bilateral radial and DP pulses.  No murmurs. Cap refill less than 2 seconds. Respiratory:    Normal respiratory effort without tachypnea/retractions.  Diffuse inspiratory crackles.  No wheezing. Gastrointestinal:   Soft and nontender. Non distended. There is no CVA tenderness.  No rebound, rigidity, or guarding. Musculoskeletal:   Normal range of motion in all extremities. No joint effusions.  No lower extremity tenderness.  No edema. Neurologic:   Normal speech and language.  Motor grossly intact. No acute focal neurologic deficits are appreciated.  Skin:    Skin is warm, dry and intact. No rash noted.  No petechiae, purpura, or bullae.  ____________________________________________    LABS (pertinent positives/negatives) (all labs ordered are listed, but only abnormal results are displayed) Labs Reviewed  BASIC METABOLIC PANEL - Abnormal; Notable for the following components:      Result Value   Sodium 133 (*)    Glucose, Bld 119 (*)    Calcium 8.3 (*)    All other components within normal limits  CBC WITH DIFFERENTIAL/PLATELET - Abnormal; Notable for the following components:   HCT 38.7 (*)    Lymphs Abs 0.5 (*)    Abs Immature Granulocytes 0.08 (*)    All other components within normal limits   ____________________________________________   EKG  Interpreted by me Sinus rhythm rate of 91, normal axis and intervals.  Poor R wave progression.  Normal ST segments and T waves.  ____________________________________________    [id number]  DG Chest Portable 1 View  Result Date: 01/18/2020 CLINICAL DATA:  Worsening shortness of breath, COVID positive. EXAM: PORTABLE CHEST 1 VIEW COMPARISON:  None. FINDINGS: Moderate to marked severity infiltrate is seen within the right upper lobe with mild infiltrates seen along the periphery of the left lung and right lung base. There is no evidence of a pleural effusion or pneumothorax. The heart size and mediastinal contours are within normal limits. A small hiatal hernia is seen. The visualized skeletal structures are unremarkable.  IMPRESSION: 1. Moderate to marked severity right upper lobe infiltrate. 2. Mild bilateral infiltrates. Electronically Signed   By: Virgina Norfolk M.D.   On: 01/18/2020 21:57    ____________________________________________   PROCEDURES Procedures  ____________________________________________  DIFFERENTIAL DIAGNOSIS   Pleural effusion, worsening Covid pneumonitis, bacterial pneumonia, pulmonary embolism  CLINICAL IMPRESSION / ASSESSMENT AND PLAN / ED COURSE  Medications ordered in the ED: Medications  cefTRIAXone (ROCEPHIN) 2 g in sodium chloride 0.9 % 100 mL IVPB (2 g Intravenous New Bag/Given 01/18/20 2222)  sodium chloride 0.9 % bolus 500 mL (500 mLs Intravenous New Bag/Given 01/18/20 2219)    Pertinent labs & imaging results that were available during  my care of the patient were reviewed by me and considered in my medical decision making (see chart for details).   Balentin Contino was evaluated in Emergency Department on 01/18/2020 for the symptoms described in the history of present illness. He was evaluated in the context of the global COVID-19 pandemic, which necessitated consideration that the patient might be at risk for infection with the SARS-CoV-2 virus that causes COVID-19. Institutional protocols and algorithms that pertain to the evaluation of patients at risk for COVID-19 are in a state of rapid change based on information released by regulatory bodies including the CDC and federal and state organizations. These policies and algorithms were followed during the patient's care in the ED.   Patient presents with worsening shortness of breath in the setting of known Covid infection.  Vital signs are unremarkable, oxygenation is normal in the mid 90s on his baseline 2 L nasal cannula.  No chest pain.  Highly doubt PE, ACS, CHF, aortic dissection, carditis.  Will check labs, give IV fluids for hydration, obtain chest x-ray.  Patient is not  septic.  ----------------------------------------- 10:32 PM on 01/18/2020 -----------------------------------------  Chest x-ray shows worsening of airspace opacities consistent with evolving Covid pneumonitis or possibly bacterial superinfection.  Patient given 2 g ceftriaxone IV in the ED.  I will continue him on Augmentin.  Vital signs are stable, labs are unremarkable, he is not in respiratory distress I think he can continue to be managed outpatient.      ____________________________________________   FINAL CLINICAL IMPRESSION(S) / ED DIAGNOSES    Final diagnoses:  Pneumonia due to COVID-19 virus  Respiratory failure with hypoxia, unspecified chronicity [facility])     ED Discharge Orders         Ordered    amoxicillin-clavulanate (AUGMENTIN) 875-125 MG tablet  2 times daily     01/18/20 2225    ondansetron (ZOFRAN ODT) 4 MG disintegrating tablet  Every 8 hours PRN     01/18/20 2225          Portions of this note were generated with dragon dictation software. Dictation errors may occur despite best attempts at proofreading.   Carrie Mew, MD 01/18/20 2233

## 2020-01-18 NOTE — ED Triage Notes (Signed)
Pt states he was dx with covid 5/10 and discharged home with o2 at 2l. States since has been having worsening shob and weakness. Today sats were mid 80's but pt took off o2 for 2 hrs while he was showering. Family sent here for cxr to check for worsening lungs.

## 2020-01-18 NOTE — H&P (Signed)
History and Physical    Geoffrey Wells [id number] DOB: [birth year] DOA: 01/18/2020  PCP: [facility] [place]  Patient coming from: Home  I have personally briefly reviewed patient's old medical records in [place]  Chief Complaint: Dyspnea  HPI: Geoffrey Wells is a 74 y.o. male with medical history significant for recently diagnosed COVID-19 pneumonia who presents to the ED for worsening shortness of breath.  Patient had a positive COVID-19 test on 01/10/2020 and was admitted at [facility] hospital from 01/10/2020-01/12/2020 for acute respiratory failure with hypoxemia due to COVID-19 pneumonia.  He was started on treatment with IV remdesivir and IV Decadron.  He was discharged on 01/12/2020 on 2 L supplemental O2 via [place] and completed 5 days total outpatient IV remdesivir treatment at the Covid infusion clinic in [place].  Does not appear he was discharged with steroids.  Patient reports having some increased shortness of breath and O2 requirement.  He has continued nonproductive cough but otherwise denies any subjective fevers, chills, diaphoresis, nausea, vomiting, abdominal pain, dysuria, or diarrhea.  Family were concerned about increased work of breathing and so he presented to the ED for further evaluation and management.  He denies any previously known personal medical conditions.  He reports former tobacco use, quitting 16 years ago.  He reports daily wine or beer use without prior history of withdrawal.  He denies any illicit drug use.  He is unaware of any medical conditions in his immediate family.  ED Course:  Initial vitals showed BP 139/74, pulse 90, RR 27, temp 98.3 Fahrenheit, SPO2 95% on 2 L supplemental O2 via [place].  Labs are notable for WBC 7.0, hemoglobin 13.6, platelets 255,000, sodium 133, potassium 3.6, bicarb 23, BUN 21, creatinine 0.95.  Portable chest x-ray shows marked right upper lobe infiltrate with mild bilateral infiltrates.  CTA chest PE study showed marked  severity diffuse bilateral patchy groundglass appearing infiltrates and findings consistent with mild, bilateral upper lobe and lower lobe pulmonary emboli.  Large gastric hernia and cholelithiasis also noted.  Patient was given 500 cc normal saline and 2 g IV ceftriaxone.  Patient was started on IV heparin infusion.  The hospitalist service was consulted to admit for further evaluation and management.  Review of Systems: All systems reviewed and are negative except as documented in history of present illness above.   Past Medical History:  Diagnosis Date  . Pneumonia due to COVID-19 virus 01/10/2020    Past Surgical History:  Procedure Laterality Date  . HERNIA REPAIR      Social History:  reports that he has quit smoking. His smoking use included cigarettes. He has a 12.50 pack-year smoking history. He has never used smokeless tobacco. He reports current alcohol use of about 7.0 standard drinks of alcohol per week. He reports previous drug use.  No Known Allergies  History reviewed. No pertinent family history.   Prior to Admission medications   Medication Sig Start Date End Date Taking? Authorizing Provider  amoxicillin-clavulanate (AUGMENTIN) 875-125 MG tablet Take 1 tablet by mouth 2 (two) times daily. 01/18/20   Carrie Mew, MD  ascorbic acid (VITAMIN C) 500 MG tablet Take 1 tablet (500 mg total) by mouth daily. 01/13/20   Max Sane, MD  ondansetron (ZOFRAN ODT) 4 MG disintegrating tablet Take 1 tablet (4 mg total) by mouth every 8 (eight) hours as needed for nausea or vomiting. 01/18/20   Carrie Mew, MD  zinc sulfate 220 (50 Zn) MG capsule Take 1 capsule (220 mg total)  by mouth daily. 01/13/20   Max Sane, MD    Physical Exam: Vitals:   01/18/20 2127 01/18/20 2130 01/18/20 2200 01/18/20 2230  BP:  124/64 139/74 134/87  Pulse:  90  84  Resp:  (!) 26 (!) 27   Temp:      TempSrc:      SpO2:  95%  97%  Weight: 134.3 kg     Height: 6\' 1"  (1.854 m)       Constitutional: Resting supine in bed, NAD, calm, comfortable Eyes: PERRL, lids and conjunctivae normal ENMT: Mucous membranes are moist. Posterior pharynx clear of any exudate or lesions.Normal dentition.  Neck: normal, supple, no masses. Respiratory: Bibasilar inspiratory crackles. Normal respiratory effort. No accessory muscle use.  Cardiovascular: Regular rate and rhythm, no murmurs / rubs / gallops. No extremity edema. 2+ pedal pulses. Abdomen: no tenderness, no masses palpated. No hepatosplenomegaly. Bowel sounds positive.  Musculoskeletal: no clubbing / cyanosis. No joint deformity upper and lower extremities. Good ROM, no contractures. Normal muscle tone.  Skin: no rashes, lesions, ulcers. No induration Neurologic: CN 2-12 grossly intact. Sensation intact, Strength 5/5 in all 4.  Psychiatric: Normal judgment and insight. Alert and oriented x 3. Normal mood.   Labs on Admission: I have personally reviewed following labs and imaging studies  CBC: Recent Labs  Lab 01/12/20 0448 01/18/20 2138  WBC 3.9* 7.0  NEUTROABS 2.9 6.0  HGB 13.7 13.6  HCT 39.4 38.7*  MCV 86.0 85.6  PLT 100* [id number]   Basic Metabolic Panel: Recent Labs  Lab 01/12/20 0448 01/18/20 2138  NA 135 133*  K 4.2 3.6  CL 105 100  CO2 23 23  GLUCOSE 139* 119*  BUN 24* 21  CREATININE 0.74 0.95  CALCIUM 8.0* 8.3*  MG 2.1  --    GFR: Estimated Creatinine Clearance: 101.1 mL/min (by C-G formula based on SCr of 0.95 mg/dL). Liver Function Tests: Recent Labs  Lab 01/12/20 0448  AST 36  ALT 18  ALKPHOS 38  BILITOT 0.7  PROT 5.8*  ALBUMIN 2.9*   No results for input(s): LIPASE, AMYLASE in the last 168 hours. No results for input(s): AMMONIA in the last 168 hours. Coagulation Profile: No results for input(s): INR, PROTIME in the last 168 hours. Cardiac Enzymes: No results for input(s): CKTOTAL, CKMB, CKMBINDEX, TROPONINI in the last 168 hours. BNP (last 3 results) No results for input(s): PROBNP in  the last 8760 hours. HbA1C: No results for input(s): HGBA1C in the last 72 hours. CBG: No results for input(s): GLUCAP in the last 168 hours. Lipid Profile: No results for input(s): CHOL, HDL, LDLCALC, TRIG, CHOLHDL, LDLDIRECT in the last 72 hours. Thyroid Function Tests: No results for input(s): TSH, T4TOTAL, FREET4, T3FREE, THYROIDAB in the last 72 hours. Anemia Panel: No results for input(s): VITAMINB12, FOLATE, FERRITIN, TIBC, IRON, RETICCTPCT in the last 72 hours. Urine analysis: No results found for: COLORURINE, APPEARANCEUR, LABSPEC, PHURINE, GLUCOSEU, HGBUR, BILIRUBINUR, KETONESUR, PROTEINUR, UROBILINOGEN, NITRITE, LEUKOCYTESUR  Radiological Exams on Admission: CT Angio Chest PE W and/or Wo Contrast  Result Date: 01/18/2020 CLINICAL DATA:  Shortness of breath. EXAM: CT ANGIOGRAPHY CHEST WITH CONTRAST TECHNIQUE: Multidetector CT imaging of the chest was performed using the standard protocol during bolus administration of intravenous contrast. Multiplanar CT image reconstructions and MIPs were obtained to evaluate the vascular anatomy. CONTRAST:  160mL OMNIPAQUE IOHEXOL 350 MG/ML SOLN COMPARISON:  Jan 10, 2020 FINDINGS: Cardiovascular: There is moderate severity calcification of the thoracic aorta. Mild areas of intraluminal low attenuation  are seen involving multiple upper lobe and lower lobe branches of the left pulmonary artery. Normal heart size. No pericardial effusion. Moderate severity coronary artery calcification is seen. Mediastinum/Nodes: No enlarged mediastinal, hilar, or axillary lymph nodes. Thyroid gland, trachea, and esophagus demonstrate no significant findings. Lungs/Pleura: Marked severity diffuse patchy and ground-glass appearing infiltrates which is increased in severity when compared to the prior study. There is no evidence of a pleural effusion or pneumothorax. Upper Abdomen: There is a large gastric hernia. A subcentimeter gallstone is seen within the gallbladder lumen.  Musculoskeletal: Multilevel degenerative changes seen throughout the thoracic spine. Review of the MIP images confirms the above findings. IMPRESSION: 1. Marked severity diffuse bilateral patchy and ground-glass appearing infiltrates. 2. Findings consistent with mild, bilateral upper lobe and lower lobe pulmonary emboli. 3. Large gastric hernia. 4. Cholelithiasis. Aortic Atherosclerosis (ICD10-I70.0). Electronically Signed   By: Virgina Norfolk M.D.   On: 01/18/2020 23:53   DG Chest Portable 1 View  Result Date: 01/18/2020 CLINICAL DATA:  Worsening shortness of breath, COVID positive. EXAM: PORTABLE CHEST 1 VIEW COMPARISON:  None. FINDINGS: Moderate to marked severity infiltrate is seen within the right upper lobe with mild infiltrates seen along the periphery of the left lung and right lung base. There is no evidence of a pleural effusion or pneumothorax. The heart size and mediastinal contours are within normal limits. A small hiatal hernia is seen. The visualized skeletal structures are unremarkable. IMPRESSION: 1. Moderate to marked severity right upper lobe infiltrate. 2. Mild bilateral infiltrates. Electronically Signed   By: Virgina Norfolk M.D.   On: 01/18/2020 21:57    EKG: Ordered and pending.  Assessment/Plan Principal Problem:   Acute hypoxemic respiratory failure due to COVID-19 [facility]) Active Problems:   Bilateral pulmonary embolism (HCC)  Geoffrey Wells is a 74 y.o. male with medical history significant for recently diagnosed COVID-19 pneumonia who is admitted with acute respiratory failure with hypoxia due to COVID-19 pneumonia and bilateral pulmonary emboli.  Acute hypoxemic respiratory failure due to COVID-19 pneumonia and bilateral pulmonary emboli: SARS-CoV-2 positive on 01/10/2020.  Admitted from 5/10-5/12/21 and while in hospital treated with IV remdesivir and Decadron.  Discharged on 2 L O2 and completed total 5 days IV remdesivir at the outpatient infusion clinic. -Start IV  Solu-Medrol 40 mg twice daily -Low suspicion for secondary bacterial superinfection at this time, will withhold further antibiotics for now -Continue IV heparin -Continue supplemental oxygen, currently requiring 4 L O2, wean down as able -Continue Combivent, incentive spirometer, flutter valve -Continue antitussives, vitamin C, zinc -Check procalcitonin, BMP, and daily labs with inflammatory markers  Bilateral upper and lower lobe pulmonary emboli: Mild bilateral pulmonary emboli seen on CT imaging.  Continue IV heparin as above.  Monitor hemoglobin, signs/symptoms of bleeding, and platelet count as patient was thrombocytopenic during recent admission.  Platelets improved to 255,000 now.  DVT prophylaxis: Heparin anticoagulation Code Status: Full code, confirmed with patient Family Communication: Discussed with patient, he has discussed with family Disposition Plan: From home and likely discharge to home pending improvement in respiratory status, ability to wean down oxygen, and transition to oral anticoagulation. Consults called: None Admission status:  Status is: Inpatient  Remains inpatient appropriate because:IV treatments appropriate due to intensity of illness or inability to take PO and Inpatient level of care appropriate due to severity of illness   Dispo: The patient is from: Home              Anticipated d/c is to: Home  Anticipated d/c date is: 2 days pending improvement in respiratory status, ability to wean down oxygen, transition to oral anticoagulation.              Patient currently is not medically stable to d/c.   Zada Finders MD [facility]  If 7PM-7AM, please contact night-coverage [email or web address]  01/19/2020, 12:20 AM

## 2020-01-19 DIAGNOSIS — I2699 Other pulmonary embolism without acute cor pulmonale: Secondary | ICD-10-CM | POA: Diagnosis present

## 2020-01-19 DIAGNOSIS — U071 COVID-19: Principal | ICD-10-CM

## 2020-01-19 DIAGNOSIS — J9601 Acute respiratory failure with hypoxia: Secondary | ICD-10-CM

## 2020-01-19 DIAGNOSIS — J1282 Pneumonia due to coronavirus disease 2019: Secondary | ICD-10-CM

## 2020-01-19 LAB — CBC WITH DIFFERENTIAL/PLATELET
Abs Immature Granulocytes: 0.09 10*3/uL — ABNORMAL HIGH (ref 0.00–0.07)
Basophils Absolute: 0 10*3/uL (ref 0.0–0.1)
Basophils Relative: 0 %
Eosinophils Absolute: 0 10*3/uL (ref 0.0–0.5)
Eosinophils Relative: 0 %
HCT: 37.8 % — ABNORMAL LOW (ref 39.0–52.0)
Hemoglobin: 13.2 g/dL (ref 13.0–17.0)
Immature Granulocytes: 1 %
Lymphocytes Relative: 5 %
Lymphs Abs: 0.3 10*3/uL — ABNORMAL LOW (ref 0.7–4.0)
MCH: 30.1 pg (ref 26.0–34.0)
MCHC: 34.9 g/dL (ref 30.0–36.0)
MCV: 86.3 fL (ref 80.0–100.0)
Monocytes Absolute: 0.2 10*3/uL (ref 0.1–1.0)
Monocytes Relative: 3 %
Neutro Abs: 6.3 10*3/uL (ref 1.7–7.7)
Neutrophils Relative %: 91 %
Platelets: 255 10*3/uL (ref 150–400)
RBC: 4.38 MIL/uL (ref 4.22–5.81)
RDW: 14.2 % (ref 11.5–15.5)
WBC: 7 10*3/uL (ref 4.0–10.5)
nRBC: 0 % (ref 0.0–0.2)

## 2020-01-19 LAB — COMPREHENSIVE METABOLIC PANEL
ALT: 58 U/L — ABNORMAL HIGH (ref 0–44)
AST: 78 U/L — ABNORMAL HIGH (ref 15–41)
Albumin: 2.5 g/dL — ABNORMAL LOW (ref 3.5–5.0)
Alkaline Phosphatase: 90 U/L (ref 38–126)
Anion gap: 10 (ref 5–15)
BUN: 15 mg/dL (ref 8–23)
CO2: 21 mmol/L — ABNORMAL LOW (ref 22–32)
Calcium: 7.9 mg/dL — ABNORMAL LOW (ref 8.9–10.3)
Chloride: 102 mmol/L (ref 98–111)
Creatinine, Ser: 0.71 mg/dL (ref 0.61–1.24)
GFR calc Af Amer: >60 mL/min (ref >60)
GFR calc non Af Amer: >60 mL/min (ref >60)
Glucose, Bld: 122 mg/dL — ABNORMAL HIGH (ref 70–99)
Potassium: 3.5 mmol/L (ref 3.5–5.1)
Sodium: 133 mmol/L — ABNORMAL LOW (ref 135–145)
Total Bilirubin: 1.3 mg/dL — ABNORMAL HIGH (ref 0.3–1.2)
Total Protein: 6 g/dL — ABNORMAL LOW (ref 6.5–8.1)

## 2020-01-19 LAB — MAGNESIUM: Magnesium: 2 mg/dL (ref 1.7–2.4)

## 2020-01-19 LAB — BLOOD GAS, VENOUS
Acid-base deficit: 0.3 mmol/L (ref 0.0–2.0)
Bicarbonate: 24.1 mmol/L (ref 20.0–28.0)
O2 Saturation: 69.7 %
Patient temperature: 37
pCO2, Ven: 38 mmHg — ABNORMAL LOW (ref 44.0–60.0)
pH, Ven: 7.41 (ref 7.250–7.430)
pO2, Ven: 36 mmHg (ref 32.0–45.0)

## 2020-01-19 LAB — FIBRIN DERIVATIVES D-DIMER (ARMC ONLY): Fibrin derivatives D-dimer (ARMC): >7500 ng/mL (FEU) — ABNORMAL HIGH (ref 0.00–499.00)

## 2020-01-19 LAB — C-REACTIVE PROTEIN: CRP: 20 mg/dL — ABNORMAL HIGH (ref <1.0)

## 2020-01-19 LAB — PROCALCITONIN: Procalcitonin: 0.23 ng/mL

## 2020-01-19 LAB — ABO/RH: ABO/RH(D): O POS

## 2020-01-19 LAB — HEPARIN LEVEL (UNFRACTIONATED)
Heparin Unfractionated: 0.57 IU/mL (ref 0.30–0.70)
Heparin Unfractionated: 0.58 IU/mL (ref 0.30–0.70)

## 2020-01-19 LAB — PHOSPHORUS: Phosphorus: 3 mg/dL (ref 2.5–4.6)

## 2020-01-19 LAB — PROTIME-INR
INR: 1.1 (ref 0.8–1.2)
Prothrombin Time: 13.7 seconds (ref 11.4–15.2)

## 2020-01-19 LAB — BRAIN NATRIURETIC PEPTIDE: B Natriuretic Peptide: 91.4 pg/mL (ref 0.0–100.0)

## 2020-01-19 LAB — FERRITIN: Ferritin: 557 ng/mL — ABNORMAL HIGH (ref 24–336)

## 2020-01-19 LAB — APTT: aPTT: 35 seconds (ref 24–36)

## 2020-01-19 MED ORDER — METHYLPREDNISOLONE SODIUM SUCC 40 MG IJ SOLR
40.0000 mg | Freq: Two times a day (BID) | INTRAMUSCULAR | Status: AC
  Administered 2020-01-19 – 2020-01-28 (×20): 40 mg via INTRAVENOUS
  Filled 2020-01-19 (×21): qty 1

## 2020-01-19 MED ORDER — HEPARIN SODIUM (PORCINE) 5000 UNIT/ML IJ SOLN
4000.0000 [IU] | Freq: Once | INTRAMUSCULAR | Status: AC
  Administered 2020-01-19: 4000 [IU] via INTRAVENOUS
  Filled 2020-01-19: qty 1

## 2020-01-19 MED ORDER — ASCORBIC ACID 500 MG PO TABS
500.0000 mg | ORAL_TABLET | Freq: Every day | ORAL | Status: DC
  Administered 2020-01-19 – 2020-01-28 (×10): 500 mg via ORAL
  Filled 2020-01-19 (×11): qty 1

## 2020-01-19 MED ORDER — GUAIFENESIN-DM 100-10 MG/5ML PO SYRP
10.0000 mL | ORAL_SOLUTION | ORAL | Status: DC | PRN
  Filled 2020-01-19: qty 10

## 2020-01-19 MED ORDER — ACETAMINOPHEN 325 MG PO TABS: 650.0000 mg | ORAL_TABLET | Freq: Four times a day (QID) | ORAL | Status: DC | PRN

## 2020-01-19 MED ORDER — HEPARIN (PORCINE) 25000 UT/250ML-% IV SOLN: 16.0000 [IU]/kg/h | INTRAVENOUS | Status: DC

## 2020-01-19 MED ORDER — HYDROCOD POLST-CPM POLST ER 10-8 MG/5ML PO SUER: 5.0000 mL | Freq: Two times a day (BID) | ORAL | Status: DC | PRN

## 2020-01-19 MED ORDER — IPRATROPIUM-ALBUTEROL 20-100 MCG/ACT IN AERS
1.0000 | INHALATION_SPRAY | Freq: Four times a day (QID) | RESPIRATORY_TRACT | Status: DC
  Administered 2020-01-19 – 2020-01-29 (×39): 1 via RESPIRATORY_TRACT
  Filled 2020-01-19: qty 4

## 2020-01-19 MED ORDER — ZINC SULFATE 220 (50 ZN) MG PO CAPS
220.0000 mg | ORAL_CAPSULE | Freq: Every day | ORAL | Status: DC
  Administered 2020-01-19 – 2020-01-28 (×10): 220 mg via ORAL
  Filled 2020-01-19 (×11): qty 1

## 2020-01-19 MED ORDER — ONDANSETRON HCL 4 MG PO TABS: 4.0000 mg | ORAL_TABLET | Freq: Four times a day (QID) | ORAL | Status: DC | PRN

## 2020-01-19 MED ORDER — ONDANSETRON HCL 4 MG/2ML IJ SOLN: 4.0000 mg | Freq: Four times a day (QID) | INTRAMUSCULAR | Status: DC | PRN

## 2020-01-19 MED ORDER — SODIUM CHLORIDE 0.9% FLUSH
3.0000 mL | Freq: Two times a day (BID) | INTRAVENOUS | Status: DC
  Administered 2020-01-19 – 2020-01-28 (×18): 3 mL via INTRAVENOUS

## 2020-01-19 MED ORDER — SENNOSIDES-DOCUSATE SODIUM 8.6-50 MG PO TABS
1.0000 | ORAL_TABLET | Freq: Every evening | ORAL | Status: DC | PRN
  Administered 2020-01-19: 1 via ORAL
  Filled 2020-01-19: qty 1

## 2020-01-19 MED ORDER — HEPARIN (PORCINE) 25000 UT/250ML-% IV SOLN
1600.0000 [IU]/h | INTRAVENOUS | Status: DC
  Administered 2020-01-19 (×2): 1600 [IU]/h via INTRAVENOUS
  Filled 2020-01-19 (×3): qty 250

## 2020-01-19 NOTE — ED Provider Notes (Signed)
-----------------------------------------   12:04 AM on 01/19/2020 -----------------------------------------  Patient was admitted to hospitalist services.  I spoke with radiology regarding patient's CTA which demonstrates worsening pneumonia and bilateral PE.  Have started heparin bolus and drip.   Paulette Blanch, MD 01/19/20 [phone]

## 2020-01-19 NOTE — ED Notes (Signed)
Repot given to receiving nurse patient transfer to 120.

## 2020-01-19 NOTE — ED Notes (Signed)
Pt seems more alert and oriented than previously

## 2020-01-19 NOTE — ED Notes (Signed)
lunch provided.  

## 2020-01-19 NOTE — ED Notes (Signed)
Pt provided with breakfast tray.

## 2020-01-19 NOTE — ED Notes (Signed)
Pt's son called and provided with update and pt status. Son's phone number added to contact.

## 2020-01-19 NOTE — ED Notes (Signed)
patient laying in bed comfortably. Denies change in respiratory ststus. sating sating 93% on 5lnc. Awaiting bed status will continue to monitor.

## 2020-01-19 NOTE — ED Notes (Signed)
Pt speaking with daughter on the phone at this time

## 2020-01-19 NOTE — Progress Notes (Signed)
PROGRESS NOTE    Geoffrey Wells  YFV:[id number] DOB: [birth year] DOA: 01/18/2020 PCP: [facility] [place]   Brief Narrative:  Geoffrey Wells is a 74 y.o. male with medical history significant for recently diagnosed COVID-19 pneumonia who presents to the ED for worsening shortness of breath.  Patient had a positive COVID-19 test on 01/10/2020 and was admitted at [facility] hospital from 01/10/2020-01/12/2020 for acute respiratory failure with hypoxemia due to COVID-19 pneumonia.  He was started on treatment with IV remdesivir and IV Decadron.  He was discharged on 01/12/2020 on 2 L supplemental O2 via [place] and completed 5 days total outpatient IV remdesivir treatment at the Covid infusion clinic in [place].  Does not appear he was discharged with steroids.  Patient came back to the hospital again with increased short of breath, hypoxemia, currently he is on 5 L oxygen.  Chest CT scan showed bilateral PE, bilateral lungs groundglass consolidation, worse on the right side.  He is placed on heparin drip, restarted IV steroids.   Assessment & Plan:   Principal Problem:   Acute hypoxemic respiratory failure due to COVID-19 [facility]) Active Problems:   Bilateral pulmonary embolism ([place])  #1.  Acute hypoxemic restaurant failure. This appears to be secondary to combination of COVID-19 pneumonia as well as bilateral PE.  Currently, he is on 5 L oxygen, no significant shortness of breath.  Saturations well.  #2.  Bilateral PE. Secondary to Covid infection.  Continue heparin drip for now.  Will transition to oral Eliquis versus Xarelto when ready to discharge.  #3.  Covid viral pneumonia. Currently, no evidence of bacterial pneumonia.  Will monitor procalcitonin level.  #4.  Mild hyponatremia. Follow.   DVT prophylaxis: Heparin Code Status: Full Family Communication: None in room Disposition Plan:  . Patient came from: Home            . Anticipated d/c place: Home . Barriers to d/c OR conditions which  need to be met to effect a safe d/c:   Consultants:   None  Procedures: None Antimicrobials: None  Subjective: Patient is still on 5 L oxygen, currently denies any significant short of breath.  No cough.  No chest pain. No fever or chills. No diarrhea constipation.  Objective: Vitals:   01/19/20 1100 01/19/20 1201 01/19/20 1207 01/19/20 1400  BP: 108/71 122/69  121/76  Pulse:  83  74  Resp: (!) 25 18  (!) 23  Temp:      TempSrc:      SpO2:  92% 93% 90%  Weight:      Height:        Intake/Output Summary (Last 24 hours) at 01/19/2020 1455 Last data filed at 01/19/2020 1409 Gross per 24 hour  Intake -  Output 1200 ml  Net -1200 ml   Filed Weights   01/18/20 2127 01/19/20 0758  Weight: 134.3 kg 134.3 kg    Examination:  General exam: Appears calm and comfortable  Respiratory system: Coarse breathing sounds bilaterally. Respiratory effort normal. Cardiovascular system: S1 & S2 heard, RRR. No JVD, murmurs, rubs, gallops or clicks. No pedal edema. Gastrointestinal system: Abdomen is nondistended, soft and nontender. No organomegaly or masses felt. Normal bowel sounds heard. Central nervous system: Alert and oriented. No focal neurological deficits. Extremities: Symmetric 5 x 5 power. Skin: No rashes, lesions or ulcers Psychiatry: Judgement and insight appear normal. Mood & affect appropriate.     Data Reviewed: I have personally reviewed following labs and imaging studies  CBC: Recent Labs  Lab 01/18/20 2138 01/19/20 0429  WBC 7.0 7.0  NEUTROABS 6.0 6.3  HGB 13.6 13.2  HCT 38.7* 37.8*  MCV 85.6 86.3  PLT 255 704   Basic Metabolic Panel: Recent Labs  Lab 01/18/20 2138 01/19/20 0429  NA 133* 133*  K 3.6 3.5  CL 100 102  CO2 23 21*  GLUCOSE 119* 122*  BUN 21 15  CREATININE 0.95 0.71  CALCIUM 8.3* 7.9*  MG  --  2.0  PHOS  --  3.0   GFR: Estimated Creatinine Clearance: 120.1 mL/min (by C-G formula based on SCr of 0.71 mg/dL). Liver Function  Tests: Recent Labs  Lab 01/19/20 0429  AST 78*  ALT 58*  ALKPHOS 90  BILITOT 1.3*  PROT 6.0*  ALBUMIN 2.5*   No results for input(s): LIPASE, AMYLASE in the last 168 hours. No results for input(s): AMMONIA in the last 168 hours. Coagulation Profile: Recent Labs  Lab 01/18/20 2138  INR 1.1   Cardiac Enzymes: No results for input(s): CKTOTAL, CKMB, CKMBINDEX, TROPONINI in the last 168 hours. BNP (last 3 results) No results for input(s): PROBNP in the last 8760 hours. HbA1C: No results for input(s): HGBA1C in the last 72 hours. CBG: No results for input(s): GLUCAP in the last 168 hours. Lipid Profile: No results for input(s): CHOL, HDL, LDLCALC, TRIG, CHOLHDL, LDLDIRECT in the last 72 hours. Thyroid Function Tests: No results for input(s): TSH, T4TOTAL, FREET4, T3FREE, THYROIDAB in the last 72 hours. Anemia Panel: Recent Labs    01/19/20 0429  FERRITIN 557*   Sepsis Labs: Recent Labs  Lab 01/19/20 0429  PROCALCITON 0.23    No results found for this or any previous visit (from the past 240 hour(s)).       Radiology Studies: CT Angio Chest PE W and/or Wo Contrast  Result Date: 01/18/2020 CLINICAL DATA:  Shortness of breath. EXAM: CT ANGIOGRAPHY CHEST WITH CONTRAST TECHNIQUE: Multidetector CT imaging of the chest was performed using the standard protocol during bolus administration of intravenous contrast. Multiplanar CT image reconstructions and MIPs were obtained to evaluate the vascular anatomy. CONTRAST:  1102m OMNIPAQUE IOHEXOL 350 MG/ML SOLN COMPARISON:  Jan 10, 2020 FINDINGS: Cardiovascular: There is moderate severity calcification of the thoracic aorta. Mild areas of intraluminal low attenuation are seen involving multiple upper lobe and lower lobe branches of the left pulmonary artery. Normal heart size. No pericardial effusion. Moderate severity coronary artery calcification is seen. Mediastinum/Nodes: No enlarged mediastinal, hilar, or axillary lymph nodes.  Thyroid gland, trachea, and esophagus demonstrate no significant findings. Lungs/Pleura: Marked severity diffuse patchy and ground-glass appearing infiltrates which is increased in severity when compared to the prior study. There is no evidence of a pleural effusion or pneumothorax. Upper Abdomen: There is a large gastric hernia. A subcentimeter gallstone is seen within the gallbladder lumen. Musculoskeletal: Multilevel degenerative changes seen throughout the thoracic spine. Review of the MIP images confirms the above findings. IMPRESSION: 1. Marked severity diffuse bilateral patchy and ground-glass appearing infiltrates. 2. Findings consistent with mild, bilateral upper lobe and lower lobe pulmonary emboli. 3. Large gastric hernia. 4. Cholelithiasis. Aortic Atherosclerosis (ICD10-I70.0). Electronically Signed   By: TVirgina NorfolkM.D.   On: 01/18/2020 23:53   DG Chest Portable 1 View  Result Date: 01/18/2020 CLINICAL DATA:  Worsening shortness of breath, COVID positive. EXAM: PORTABLE CHEST 1 VIEW COMPARISON:  None. FINDINGS: Moderate to marked severity infiltrate is seen within the right upper lobe with mild infiltrates seen along the periphery of the left lung and  right lung base. There is no evidence of a pleural effusion or pneumothorax. The heart size and mediastinal contours are within normal limits. A small hiatal hernia is seen. The visualized skeletal structures are unremarkable. IMPRESSION: 1. Moderate to marked severity right upper lobe infiltrate. 2. Mild bilateral infiltrates. Electronically Signed   By: Virgina Norfolk M.D.   On: 01/18/2020 21:57        Scheduled Meds: . vitamin C  500 mg Oral Daily  . Ipratropium-Albuterol  1 puff Inhalation Q6H  . methylPREDNISolone (SOLU-MEDROL) injection  40 mg Intravenous Q12H  . sodium chloride flush  3 mL Intravenous Q12H  . zinc sulfate  220 mg Oral Daily   Continuous Infusions: . heparin 1,600 Units/hr (01/19/20 0833)     LOS: 1  day    Time spent: 32 minutes    Sharen Hones, MD [facility]   To contact the attending provider between 7A-7P or the covering provider during after hours 7P-7A, please log into the web site [email or web address] and access using universal [place] password for that web site. If you do not have the password, please call the hospital operator.  01/19/2020, 2:55 PM

## 2020-01-19 NOTE — ED Notes (Signed)
Pt found in bed with [place] off tangled in blankets/cords. Pt reports he got out of bed to use the bathroom, which resulted in monitoring equipment, as well as infusing IV, to come off/out.   Pt seems to be slightly confused, although he answers all orientation questions appropriately. Pt repeatedly has removed [place].   Pt again reminded to use call bell or urinal, since IV heparin is being administered. Bed alarm turned on at this time  Pt repeatedly has asked for his backpack, but no backpack arrived with patient from EMS

## 2020-01-19 NOTE — ED Notes (Signed)
Second attempt to call report as per tech who picked phone up states nurse is off floor will have have call ed upon return.

## 2020-01-19 NOTE — ED Notes (Signed)
Admit Md at bedside to consult patient. 1st attempt to call report.

## 2020-01-19 NOTE — Progress Notes (Signed)
ANTICOAGULATION CONSULT NOTE - Initial Consult  Pharmacy Consult for heparin Indication: pulmonary embolus  No Known Allergies  Patient Measurements: Height: 6\' 1"  (185.4 cm) Weight: 134.3 kg (296 lb) IBW/kg (Calculated) : 79.9 Heparin Dosing Weight: 101.7 kg  Vital Signs: BP: 122/69 (05/19 1201) Pulse Rate: 83 (05/19 1201)  Labs: Recent Labs    01/18/20 2138 01/19/20 0429 01/19/20 0834  HGB 13.6 13.2  --   HCT 38.7* 37.8*  --   PLT 255 255  --   APTT 35  --   --   LABPROT 13.7  --   --   INR 1.1  --   --   HEPARINUNFRC  --   --  0.57  CREATININE 0.95 0.71  --     Estimated Creatinine Clearance: 120.1 mL/min (by C-G formula based on SCr of 0.71 mg/dL).   Medical History: Past Medical History:  Diagnosis Date  . Pneumonia due to COVID-19 virus 01/10/2020    Medications:  Scheduled:  . vitamin C  500 mg Oral Daily  . Ipratropium-Albuterol  1 puff Inhalation Q6H  . methylPREDNISolone (SOLU-MEDROL) injection  40 mg Intravenous Q12H  . sodium chloride flush  3 mL Intravenous Q12H  . zinc sulfate  220 mg Oral Daily    Assessment: Patient admitted for dyspnea w/o PMH, patient was previously admitted on 05/10 s/t to COVID+ pneumonia, now returns w/ dyspnea and bilateral pulmonary emboli on chest CT. Patient does not have any anticoagulants PTA, baseline CBC WNL, aPTT/INR pending. Patient is being started on heparin drip for management of PE possibly s/t to sequelae of COVID+ pneumonia.  Bolused heparin 4000 units IV x 1 and started rate at 1600 units/hr.  5/19 0834 HL 0.57 Therapeutic x 1  Goal of Therapy:  Heparin level 0.3-0.7 units/ml Monitor platelets by anticoagulation protocol: Yes   Plan:  Continue current rate of 1600 units/hr  Will check anti-Xa in 8 hours per protocol for confirmatory level. Will monitor daily CBC's and adjust per anti-Xa levels.  Lu Duffel, PharmD, BCPS Clinical Pharmacist 01/19/2020 12:09 PM

## 2020-01-19 NOTE — Progress Notes (Signed)
ANTICOAGULATION CONSULT NOTE  Pharmacy Consult for heparin Indication: pulmonary embolus  No Known Allergies  Patient Measurements: Height: 6\' 1"  (185.4 cm) Weight: 134.3 kg (296 lb) IBW/kg (Calculated) : 79.9 Heparin Dosing Weight: 101.7 kg  Vital Signs: Temp: 97.8 F (36.6 C) (05/19 1557) Temp Source: Oral (05/19 1557) BP: 150/77 (05/19 1557) Pulse Rate: 75 (05/19 1557)  Labs: Recent Labs    01/18/20 2138 01/19/20 0429 01/19/20 0834 01/19/20 1627  HGB 13.6 13.2  --   --   HCT 38.7* 37.8*  --   --   PLT 255 255  --   --   APTT 35  --   --   --   LABPROT 13.7  --   --   --   INR 1.1  --   --   --   HEPARINUNFRC  --   --  0.57 0.58  CREATININE 0.95 0.71  --   --     Estimated Creatinine Clearance: 120.1 mL/min (by C-G formula based on SCr of 0.71 mg/dL).   Medical History: Past Medical History:  Diagnosis Date  . Pneumonia due to COVID-19 virus 01/10/2020    Medications:  Scheduled:  . vitamin C  500 mg Oral Daily  . Ipratropium-Albuterol  1 puff Inhalation Q6H  . methylPREDNISolone (SOLU-MEDROL) injection  40 mg Intravenous Q12H  . sodium chloride flush  3 mL Intravenous Q12H  . zinc sulfate  220 mg Oral Daily    Assessment: Patient admitted for dyspnea w/o PMH, patient was previously admitted on 05/10 s/t to COVID+ pneumonia, now returns w/ dyspnea and bilateral pulmonary emboli on chest CT. Patient does not have any anticoagulants PTA, baseline CBC WNL, aPTT/INR pending. Patient is being started on heparin drip for management of PE possibly s/t to sequelae of COVID+ pneumonia.  Bolused heparin 4000 units IV x 1 and started rate at 1600 units/hr.  5/19 0834 HL 0.57 Therapeutic x 1  Goal of Therapy:  Heparin level 0.3-0.7 units/ml Monitor platelets by anticoagulation protocol: Yes   Plan:  5/19 1627 HL 0.58 therapeutic x 2. Continue heparin drip at 1600 units/hr. HL and CBC with morning labs.  Tawnya Crook, PharmD Clinical  Pharmacist 01/19/2020 5:27 PM

## 2020-01-19 NOTE — ED Notes (Signed)
Pt ambulated with assistance to toilet.  

## 2020-01-19 NOTE — ED Notes (Signed)
Pt up to use the urinal

## 2020-01-19 NOTE — Progress Notes (Signed)
Attempted to call to get report from ED, no answer.

## 2020-01-19 NOTE — ED Notes (Signed)
Assumed care of patient. Patient pleasant and soft spoken. Denies cp/sob at present time. Vss. heparin drip infusing. Patient awaiting a medical bed. Safety maintained will monitor.

## 2020-01-19 NOTE — ED Notes (Signed)
Pt given water 

## 2020-01-19 NOTE — Progress Notes (Signed)
ANTICOAGULATION CONSULT NOTE - Initial Consult  Pharmacy Consult for heparin Indication: pulmonary embolus  No Known Allergies  Patient Measurements: Height: 6\' 1"  (185.4 cm) Weight: 134.3 kg (296 lb) IBW/kg (Calculated) : 79.9 Heparin Dosing Weight: 101.7 kg  Vital Signs: Temp: 98.3 F (36.8 C) (05/18 2126) Temp Source: Oral (05/18 2126) BP: 134/87 (05/18 2230) Pulse Rate: 84 (05/18 2230)  Labs: Recent Labs    01/18/20 2138  HGB 13.6  HCT 38.7*  PLT 255  CREATININE 0.95    Estimated Creatinine Clearance: 101.1 mL/min (by C-G formula based on SCr of 0.95 mg/dL).   Medical History: Past Medical History:  Diagnosis Date  . Pneumonia due to COVID-19 virus 01/10/2020    Medications:  Scheduled:  . vitamin C  500 mg Oral Daily  . Ipratropium-Albuterol  1 puff Inhalation Q6H  . methylPREDNISolone (SOLU-MEDROL) injection  40 mg Intravenous Q12H  . sodium chloride flush  3 mL Intravenous Q12H  . zinc sulfate  220 mg Oral Daily    Assessment: Patient admitted for dyspnea w/o PMH, patient was previously admitted on 05/10 s/t to COVID+ pneumonia, now returns w/ dyspnea and bilateral pulmonary emboli on chest CT. Patient does not have any anticoagulants PTA, baseline CBC WNL, aPTT/INR pending. Patient is being started on heparin drip for management of PE possibly s/t to sequelae of COVID+ pneumonia.  Goal of Therapy:  Heparin level 0.3-0.7 units/ml Monitor platelets by anticoagulation protocol: Yes   Plan:  Will bolus heparin 4000 units IV x 1 Will start rate at 1600 units/hr. Will check anti-Xa at 0800. Will monitor daily CBC's and adjust per anti-Xa levels.  Tobie Lords, PharmD, BCPS Clinical Pharmacist 01/19/2020,12:32 AM

## 2020-01-20 ENCOUNTER — Encounter: Payer: Self-pay | Admitting: Internal Medicine

## 2020-01-20 LAB — CBC WITH DIFFERENTIAL/PLATELET
Abs Immature Granulocytes: 0.16 10*3/uL — ABNORMAL HIGH (ref 0.00–0.07)
Basophils Absolute: 0 10*3/uL (ref 0.0–0.1)
Basophils Relative: 0 %
Eosinophils Absolute: 0 10*3/uL (ref 0.0–0.5)
Eosinophils Relative: 0 %
HCT: 36.1 % — ABNORMAL LOW (ref 39.0–52.0)
Hemoglobin: 12.7 g/dL — ABNORMAL LOW (ref 13.0–17.0)
Immature Granulocytes: 2 %
Lymphocytes Relative: 6 %
Lymphs Abs: 0.5 10*3/uL — ABNORMAL LOW (ref 0.7–4.0)
MCH: 29.7 pg (ref 26.0–34.0)
MCHC: 35.2 g/dL (ref 30.0–36.0)
MCV: 84.3 fL (ref 80.0–100.0)
Monocytes Absolute: 0.5 10*3/uL (ref 0.1–1.0)
Monocytes Relative: 5 %
Neutro Abs: 8.5 10*3/uL — ABNORMAL HIGH (ref 1.7–7.7)
Neutrophils Relative %: 87 %
Platelets: 291 10*3/uL (ref 150–400)
RBC: 4.28 MIL/uL (ref 4.22–5.81)
RDW: 14.7 % (ref 11.5–15.5)
WBC: 9.7 10*3/uL (ref 4.0–10.5)
nRBC: 0 % (ref 0.0–0.2)

## 2020-01-20 LAB — COMPREHENSIVE METABOLIC PANEL
ALT: 67 U/L — ABNORMAL HIGH (ref 0–44)
AST: 75 U/L — ABNORMAL HIGH (ref 15–41)
Albumin: 2.5 g/dL — ABNORMAL LOW (ref 3.5–5.0)
Alkaline Phosphatase: 84 U/L (ref 38–126)
Anion gap: 8 (ref 5–15)
BUN: 20 mg/dL (ref 8–23)
CO2: 26 mmol/L (ref 22–32)
Calcium: 8.6 mg/dL — ABNORMAL LOW (ref 8.9–10.3)
Chloride: 105 mmol/L (ref 98–111)
Creatinine, Ser: 0.81 mg/dL (ref 0.61–1.24)
GFR calc Af Amer: >60 mL/min (ref >60)
GFR calc non Af Amer: >60 mL/min (ref >60)
Glucose, Bld: 162 mg/dL — ABNORMAL HIGH (ref 70–99)
Potassium: 4.3 mmol/L (ref 3.5–5.1)
Sodium: 139 mmol/L (ref 135–145)
Total Bilirubin: 0.7 mg/dL (ref 0.3–1.2)
Total Protein: 6 g/dL — ABNORMAL LOW (ref 6.5–8.1)

## 2020-01-20 LAB — FIBRIN DERIVATIVES D-DIMER (ARMC ONLY): Fibrin derivatives D-dimer (ARMC): 6424.74 ng/mL (FEU) — ABNORMAL HIGH (ref 0.00–499.00)

## 2020-01-20 LAB — FERRITIN: Ferritin: 783 ng/mL — ABNORMAL HIGH (ref 24–336)

## 2020-01-20 LAB — PHOSPHORUS: Phosphorus: 3.6 mg/dL (ref 2.5–4.6)

## 2020-01-20 LAB — MAGNESIUM: Magnesium: 2.5 mg/dL — ABNORMAL HIGH (ref 1.7–2.4)

## 2020-01-20 LAB — C-REACTIVE PROTEIN: CRP: 13.9 mg/dL — ABNORMAL HIGH (ref <1.0)

## 2020-01-20 LAB — HEPARIN LEVEL (UNFRACTIONATED): Heparin Unfractionated: 0.52 IU/mL (ref 0.30–0.70)

## 2020-01-20 MED ORDER — APIXABAN 5 MG PO TABS
5.0000 mg | ORAL_TABLET | Freq: Two times a day (BID) | ORAL | Status: DC
  Administered 2020-01-27 – 2020-01-28 (×4): 5 mg via ORAL
  Filled 2020-01-20 (×5): qty 1

## 2020-01-20 MED ORDER — DEXTROSE 5 % IV SOLN: 500.0000 mg | Freq: Three times a day (TID) | INTRAVENOUS | Status: DC

## 2020-01-20 MED ORDER — SODIUM CHLORIDE 0.9 % IV SOLN
2.0000 g | Freq: Three times a day (TID) | INTRAVENOUS | Status: DC
  Administered 2020-01-20 – 2020-01-24 (×12): 2 g via INTRAVENOUS
  Filled 2020-01-20 (×15): qty 2

## 2020-01-20 MED ORDER — APIXABAN 5 MG PO TABS
10.0000 mg | ORAL_TABLET | Freq: Two times a day (BID) | ORAL | Status: AC
  Administered 2020-01-20 – 2020-01-26 (×14): 10 mg via ORAL
  Filled 2020-01-20 (×14): qty 2

## 2020-01-20 NOTE — Progress Notes (Signed)
Spoke with wife and provided update. Wife concerned that she cannot take care of pt. At home because she also has covid. I let her know that we can have PT and OT evaluate the pt. To provide recs. I did ambulate with pt. To bathroom and back to bed. He is unsteady but able to walk with one assist.

## 2020-01-20 NOTE — Progress Notes (Signed)
PROGRESS NOTE    Geoffrey Wells  ZES:[id number] DOB: [birth year] DOA: 01/18/2020 PCP: [facility] [place]   Chief complaint: Shortness of breath.  Brief Narrative:  Geoffrey Wells a 74 y.o.malewith medical history significant forrecently diagnosed COVID-19 pneumonia who presents to the ED for worsening shortness of breath. Patient had a positive COVID-19 test on 01/10/2020 and was admitted at [facility] hospital from 01/10/2020-01/12/2020 for acute respiratory failure with hypoxemia due to COVID-19 pneumonia.  He was started on treatment with IV remdesivir and IV Decadron. He was discharged on 01/12/2020 on 2 L supplemental O2 via [place] and completed 5 days total outpatient IV remdesivir treatment at the Covid infusion clinic in [place]. Does not appear he was discharged with steroids.  Patient came back to the hospital again with increased short of breath, hypoxemia, currently he is on 5 L oxygen.  Chest CT scan showed bilateral PE, bilateral lungs groundglass consolidation, worse on the right side.  He is placed on heparin drip, restarted IV steroids.   Assessment & Plan:   Principal Problem:   Acute hypoxemic respiratory failure due to COVID-19 [facility]) Active Problems:   Bilateral pulmonary embolism ([place])  #1.  Acute hypoxemic respiratory failure. Secondary to Covid pneumonia as well as bilateral PE.  Conditions are relatively stable, still on 5 L oxygen, with oxygen saturation at 94 to 95%.  Some short of breath with exertion, not short of breath at rest.  No cough.  We will continue current oxygen treatment.  2.  Bilateral PE. Secondary to Covid infection.  I will change to Eliquis.  3.  Covid viral pneumonia. Continue IV steroids.  proBNP was not elevated 5/18, recheck tomorrow.  Rechecked procalcitonin level 0.23.  Cannot rule out secondary bacterial infection.  Will cover with antibiotics with cefepime.  4.  Hyponatremia.  Resolved.  5.  Essential hypertension. Will follow for  now.   DVT prophylaxis: Eliquis Code Status: Full Family Communication: None in room Disposition Plan:   Patient came from: Home                                                                                                                           Anticipated d/c place: Home  Barriers to d/c OR conditions which need to be met to effect a safe d/c:   Consultants:   None  Procedures: None Antimicrobials:  Cefepime   Subjective: Patient feels well today.  Some short of breath with exertion.  No cough.  No fever or chills.  No chest pain. No diarrhea constipation.  No abdominal pain. No dysuria hematuria.  Objective: Vitals:   01/19/20 1557 01/19/20 1932 01/19/20 2341 01/20/20 0900  BP: (!) 150/77  135/71 (!) 164/79  Pulse: 75  74 70  Resp: 20  20 18   Temp: 97.8 F (36.6 C)  97.7 F (36.5 C)   TempSrc: Oral  Oral   SpO2:  92% 92% 97%  Weight:      Height:  Intake/Output Summary (Last 24 hours) at 01/20/2020 1403 Last data filed at 01/19/2020 2341 Gross per 24 hour  Intake 261.54 ml  Output 1750 ml  Net -1488.46 ml   Filed Weights   01/18/20 2127 01/19/20 0758  Weight: 134.3 kg 134.3 kg    Examination:  General exam: Appears calm and comfortable  Respiratory system: Clear to auscultation. Respiratory effort normal. Cardiovascular system: S1 & S2 heard, RRR. No JVD, murmurs, rubs, gallops or clicks. No pedal edema. Gastrointestinal system: Abdomen is nondistended, soft and nontender. No organomegaly or masses felt. Normal bowel sounds heard. Central nervous system: Alert and oriented. No focal neurological deficits. Extremities: Symmetric 5 x 5 power. Skin: No rashes, lesions or ulcers Psychiatry: Judgement and insight appear normal. Mood & affect appropriate.     Data Reviewed: I have personally reviewed following labs and imaging studies  CBC: Recent Labs  Lab 01/18/20 2138 01/19/20 0429 01/20/20 0637  WBC 7.0 7.0 9.7  NEUTROABS 6.0  6.3 8.5*  HGB 13.6 13.2 12.7*  HCT 38.7* 37.8* 36.1*  MCV 85.6 86.3 84.3  PLT 255 255 735   Basic Metabolic Panel: Recent Labs  Lab 01/18/20 2138 01/19/20 0429 01/20/20 0637  NA 133* 133* 139  K 3.6 3.5 4.3  CL 100 102 105  CO2 23 21* 26  GLUCOSE 119* 122* 162*  BUN 21 15 20   CREATININE 0.95 0.71 0.81  CALCIUM 8.3* 7.9* 8.6*  MG  --  2.0 2.5*  PHOS  --  3.0 3.6   GFR: Estimated Creatinine Clearance: 118.6 mL/min (by C-G formula based on SCr of 0.81 mg/dL). Liver Function Tests: Recent Labs  Lab 01/19/20 0429 01/20/20 0637  AST 78* 75*  ALT 58* 67*  ALKPHOS 90 84  BILITOT 1.3* 0.7  PROT 6.0* 6.0*  ALBUMIN 2.5* 2.5*   No results for input(s): LIPASE, AMYLASE in the last 168 hours. No results for input(s): AMMONIA in the last 168 hours. Coagulation Profile: Recent Labs  Lab 01/18/20 2138  INR 1.1   Cardiac Enzymes: No results for input(s): CKTOTAL, CKMB, CKMBINDEX, TROPONINI in the last 168 hours. BNP (last 3 results) No results for input(s): PROBNP in the last 8760 hours. HbA1C: No results for input(s): HGBA1C in the last 72 hours. CBG: No results for input(s): GLUCAP in the last 168 hours. Lipid Profile: No results for input(s): CHOL, HDL, LDLCALC, TRIG, CHOLHDL, LDLDIRECT in the last 72 hours. Thyroid Function Tests: No results for input(s): TSH, T4TOTAL, FREET4, T3FREE, THYROIDAB in the last 72 hours. Anemia Panel: Recent Labs    01/19/20 0429 01/20/20 0637  FERRITIN 557* 783*   Sepsis Labs: Recent Labs  Lab 01/19/20 0429  PROCALCITON 0.23    No results found for this or any previous visit (from the past 240 hour(s)).       Radiology Studies: CT Angio Chest PE W and/or Wo Contrast  Result Date: 01/18/2020 CLINICAL DATA:  Shortness of breath. EXAM: CT ANGIOGRAPHY CHEST WITH CONTRAST TECHNIQUE: Multidetector CT imaging of the chest was performed using the standard protocol during bolus administration of intravenous contrast. Multiplanar  CT image reconstructions and MIPs were obtained to evaluate the vascular anatomy. CONTRAST:  122m OMNIPAQUE IOHEXOL 350 MG/ML SOLN COMPARISON:  Jan 10, 2020 FINDINGS: Cardiovascular: There is moderate severity calcification of the thoracic aorta. Mild areas of intraluminal low attenuation are seen involving multiple upper lobe and lower lobe branches of the left pulmonary artery. Normal heart size. No pericardial effusion. Moderate severity coronary artery calcification is seen.  Mediastinum/Nodes: No enlarged mediastinal, hilar, or axillary lymph nodes. Thyroid gland, trachea, and esophagus demonstrate no significant findings. Lungs/Pleura: Marked severity diffuse patchy and ground-glass appearing infiltrates which is increased in severity when compared to the prior study. There is no evidence of a pleural effusion or pneumothorax. Upper Abdomen: There is a large gastric hernia. A subcentimeter gallstone is seen within the gallbladder lumen. Musculoskeletal: Multilevel degenerative changes seen throughout the thoracic spine. Review of the MIP images confirms the above findings. IMPRESSION: 1. Marked severity diffuse bilateral patchy and ground-glass appearing infiltrates. 2. Findings consistent with mild, bilateral upper lobe and lower lobe pulmonary emboli. 3. Large gastric hernia. 4. Cholelithiasis. Aortic Atherosclerosis (ICD10-I70.0). Electronically Signed   By: Virgina Norfolk M.D.   On: 01/18/2020 23:53   DG Chest Portable 1 View  Result Date: 01/18/2020 CLINICAL DATA:  Worsening shortness of breath, COVID positive. EXAM: PORTABLE CHEST 1 VIEW COMPARISON:  None. FINDINGS: Moderate to marked severity infiltrate is seen within the right upper lobe with mild infiltrates seen along the periphery of the left lung and right lung base. There is no evidence of a pleural effusion or pneumothorax. The heart size and mediastinal contours are within normal limits. A small hiatal hernia is seen. The visualized  skeletal structures are unremarkable. IMPRESSION: 1. Moderate to marked severity right upper lobe infiltrate. 2. Mild bilateral infiltrates. Electronically Signed   By: Virgina Norfolk M.D.   On: 01/18/2020 21:57        Scheduled Meds: . vitamin C  500 mg Oral Daily  . Ipratropium-Albuterol  1 puff Inhalation Q6H  . methylPREDNISolone (SOLU-MEDROL) injection  40 mg Intravenous Q12H  . sodium chloride flush  3 mL Intravenous Q12H  . zinc sulfate  220 mg Oral Daily   Continuous Infusions: . heparin 1,600 Units/hr (01/19/20 1801)     LOS: 2 days    Time spent: 26 minutes    Sharen Hones, MD [facility]   To contact the attending provider between 7A-7P or the covering provider during after hours 7P-7A, please log into the web site [email or web address] and access using universal [place] password for that web site. If you do not have the password, please call the hospital operator.  01/20/2020, 2:03 PM

## 2020-01-20 NOTE — Progress Notes (Signed)
Attempted to call and update wife, no answer. Message left.

## 2020-01-20 NOTE — Progress Notes (Signed)
ANTICOAGULATION CONSULT NOTE  Pharmacy Consult for heparin Indication: pulmonary embolus  No Known Allergies  Patient Measurements: Height: 6\' 1"  (185.4 cm) Weight: 134.3 kg (296 lb) IBW/kg (Calculated) : 79.9 Heparin Dosing Weight: 101.7 kg  Vital Signs: Temp: 97.7 F (36.5 C) (05/19 2341) Temp Source: Oral (05/19 2341) BP: 135/71 (05/19 2341) Pulse Rate: 74 (05/19 2341)  Labs: Recent Labs    01/18/20 2138 01/18/20 2138 01/19/20 0429 01/19/20 0834 01/19/20 1627 01/20/20 0637  HGB 13.6   < > 13.2  --   --  12.7*  HCT 38.7*  --  37.8*  --   --  36.1*  PLT 255  --  255  --   --  291  APTT 35  --   --   --   --   --   LABPROT 13.7  --   --   --   --   --   INR 1.1  --   --   --   --   --   HEPARINUNFRC  --   --   --  0.57 0.58 0.52  CREATININE 0.95  --  0.71  --   --  0.81   < > = values in this interval not displayed.    Estimated Creatinine Clearance: 118.6 mL/min (by C-G formula based on SCr of 0.81 mg/dL).   Medical History: Past Medical History:  Diagnosis Date  . Pneumonia due to COVID-19 virus 01/10/2020    Medications:  Scheduled:  . vitamin C  500 mg Oral Daily  . Ipratropium-Albuterol  1 puff Inhalation Q6H  . methylPREDNISolone (SOLU-MEDROL) injection  40 mg Intravenous Q12H  . sodium chloride flush  3 mL Intravenous Q12H  . zinc sulfate  220 mg Oral Daily    Assessment: Patient admitted for dyspnea w/o PMH, patient was previously admitted on 05/10 s/t to COVID+ pneumonia, now returns w/ dyspnea and bilateral pulmonary emboli on chest CT. Patient does not have any anticoagulants PTA, baseline CBC WNL, aPTT/INR pending. Patient is being started on heparin drip for management of PE possibly s/t to sequelae of COVID+ pneumonia.  Bolused heparin 4000 units IV x 1 and started rate at 1600 units/hr.  5/19 0834 HL 0.57 Therapeutic x 1 5/20 0637 HL 0.52 Therapeutic x 2  Goal of Therapy:  Heparin level 0.3-0.7 units/ml Monitor platelets by  anticoagulation protocol: Yes   Plan:  Continue heparin drip at 1600 units/hr. HL and CBC with morning labs.  Lu Duffel, PharmD, BCPS Clinical Pharmacist 01/20/2020 8:46 AM

## 2020-01-21 ENCOUNTER — Encounter: Payer: Self-pay | Admitting: Internal Medicine

## 2020-01-21 LAB — CBC WITH DIFFERENTIAL/PLATELET
Abs Immature Granulocytes: 0.19 10*3/uL — ABNORMAL HIGH (ref 0.00–0.07)
Basophils Absolute: 0 10*3/uL (ref 0.0–0.1)
Basophils Relative: 0 %
Eosinophils Absolute: 0 10*3/uL (ref 0.0–0.5)
Eosinophils Relative: 0 %
HCT: 34.4 % — ABNORMAL LOW (ref 39.0–52.0)
Hemoglobin: 12.1 g/dL — ABNORMAL LOW (ref 13.0–17.0)
Immature Granulocytes: 2 %
Lymphocytes Relative: 4 %
Lymphs Abs: 0.4 10*3/uL — ABNORMAL LOW (ref 0.7–4.0)
MCH: 30 pg (ref 26.0–34.0)
MCHC: 35.2 g/dL (ref 30.0–36.0)
MCV: 85.1 fL (ref 80.0–100.0)
Monocytes Absolute: 0.3 10*3/uL (ref 0.1–1.0)
Monocytes Relative: 3 %
Neutro Abs: 10.7 10*3/uL — ABNORMAL HIGH (ref 1.7–7.7)
Neutrophils Relative %: 91 %
Platelets: 305 10*3/uL (ref 150–400)
RBC: 4.04 MIL/uL — ABNORMAL LOW (ref 4.22–5.81)
RDW: 15.1 % (ref 11.5–15.5)
WBC: 11.7 10*3/uL — ABNORMAL HIGH (ref 4.0–10.5)
nRBC: 0 % (ref 0.0–0.2)

## 2020-01-21 LAB — COMPREHENSIVE METABOLIC PANEL
ALT: 149 U/L — ABNORMAL HIGH (ref 0–44)
AST: 139 U/L — ABNORMAL HIGH (ref 15–41)
Albumin: 2.4 g/dL — ABNORMAL LOW (ref 3.5–5.0)
Alkaline Phosphatase: 78 U/L (ref 38–126)
Anion gap: 7 (ref 5–15)
BUN: 26 mg/dL — ABNORMAL HIGH (ref 8–23)
CO2: 27 mmol/L (ref 22–32)
Calcium: 8.3 mg/dL — ABNORMAL LOW (ref 8.9–10.3)
Chloride: 104 mmol/L (ref 98–111)
Creatinine, Ser: 0.76 mg/dL (ref 0.61–1.24)
GFR calc Af Amer: >60 mL/min (ref >60)
GFR calc non Af Amer: >60 mL/min (ref >60)
Glucose, Bld: 124 mg/dL — ABNORMAL HIGH (ref 70–99)
Potassium: 4.1 mmol/L (ref 3.5–5.1)
Sodium: 138 mmol/L (ref 135–145)
Total Bilirubin: 0.7 mg/dL (ref 0.3–1.2)
Total Protein: 5.8 g/dL — ABNORMAL LOW (ref 6.5–8.1)

## 2020-01-21 LAB — FIBRIN DERIVATIVES D-DIMER (ARMC ONLY): Fibrin derivatives D-dimer (ARMC): 4833.56 ng/mL (FEU) — ABNORMAL HIGH (ref 0.00–499.00)

## 2020-01-21 LAB — PHOSPHORUS: Phosphorus: 3.6 mg/dL (ref 2.5–4.6)

## 2020-01-21 LAB — BRAIN NATRIURETIC PEPTIDE: B Natriuretic Peptide: 187.9 pg/mL — ABNORMAL HIGH (ref 0.0–100.0)

## 2020-01-21 LAB — FERRITIN: Ferritin: 696 ng/mL — ABNORMAL HIGH (ref 24–336)

## 2020-01-21 LAB — MAGNESIUM: Magnesium: 2.3 mg/dL (ref 1.7–2.4)

## 2020-01-21 LAB — C-REACTIVE PROTEIN: CRP: 6.4 mg/dL — ABNORMAL HIGH (ref <1.0)

## 2020-01-21 MED ORDER — LACTULOSE 10 GM/15ML PO SOLN
20.0000 g | Freq: Once | ORAL | Status: AC
  Administered 2020-01-21: 20 g via ORAL
  Filled 2020-01-21: qty 30

## 2020-01-21 NOTE — Progress Notes (Signed)
PROGRESS NOTE    Geoffrey Wells  VQQ:[id number] DOB: December 07, 1945 DOA: 01/18/2020 PCP: [facility] [place]   Chief Complaint: Hypoxia  Brief Narrative: [name] a 74 y.o.malewith medical history significant forrecently diagnosed COVID-19 pneumonia who presents to the ED for worsening shortness of breath. Patient had a positive COVID-19 test on 01/10/2020 and was admitted at [facility] hospital from 01/10/2020-01/12/2020 for acute respiratory failure with hypoxemia due to COVID-19 pneumonia.  He was started on treatment with IV remdesivir and IV Decadron. He was discharged on 01/12/2020 on 2 L supplemental O2 via  and completed 5 days total outpatient IV remdesivir treatment at the Covid infusion clinic in [place]. Does not appear he was discharged with steroids.  Patient came back to the hospital again with increased short of breath, hypoxemia, currently he is on 5 L oxygen. Chest CT scan showed bilateral PE, bilateral lungs groundglass consolidation, worse on the right side. He is placed on heparin drip, restarted IV steroids. Assessment & Plan:   Principal Problem:   Acute hypoxemic respiratory failure due to COVID-19 [facility]) Active Problems:   Bilateral pulmonary embolism ([place])  #1.  Acute hypoxemic respiratory failure.  Secondary to Covid pneumonia and a bilateral PE. Condition improving, currently he is on 2 L oxygen.  Short of breath much improved.  No chest pain.  Will wean off oxygen.  #2.  Bilateral PE. Continue Eliquis.  3.  Covid viral pneumonia. Patient also has a mild elevation of procalcitonin level, continue cefepime.  Also continue steroids.  4.  Hyponatremia. Resolved.  5.  Essential hypertension. Continue to follow.  6.  Constipation. We will give lactulose.   DVT prophylaxis: Eliquis Code Status:Full Family Communication:None in room Disposition Plan:  Patient came  from:Home  Anticipated d/c place:Home  Barriers to d/c OR conditions which need to be met to effect a safe d/c:   Consultants:  None  Procedures:None Antimicrobials: Cefepime   Subjective: Patient feels better today.  Currently on 2 L oxygen.  Does not feel short of breath.  No cough.  Feels constipated, has not had a bowel movement for the last 5 days.  Objective: Vitals:   01/20/20 0900 01/20/20 1500 01/21/20 0539 01/21/20 0908  BP: (!) 164/79 (!) 142/61 (!) 148/72 (!) 150/105  Pulse: 70  63 78  Resp: _0 Temp:  97.7 F (36.5 C) 98.1 F (36.7 C) (!) 97.5 F (36.4 C)  TempSrc:  Oral  Axillary  SpO2: 97%  92% 91%  Weight:      Height:        Intake/Output Summary (Last 24 hours) at 01/21/2020 1546 Last data filed at 01/21/2020 1430 Gross per 24 hour  Intake 1200 ml  Output 2625 ml  Net -1425 ml   Filed Weights   01/18/20 2127 01/19/20 0758  Weight: 134.3 kg 134.3 kg    Examination:  General exam: Appears calm and comfortable  Respiratory system: Clear to auscultation. Respiratory effort normal. Cardiovascular system: S1 & S2 heard, RRR. No JVD, murmurs, rubs, gallops or clicks. No pedal edema. Gastrointestinal system: Abdomen is nondistended, soft and nontender. No organomegaly or masses felt. Normal bowel sounds heard. Central nervous system: Alert and oriented. No focal neurological deficits. Extremities: Symmetric 5 x 5 power. Skin: No rashes, lesions or ulcers Psychiatry: Judgement and insight appear normal. Mood & affect appropriate.     Data Reviewed: I have personally reviewed following labs and imaging studies  CBC: Recent Labs  Lab 01/18/20 2138 01/19/20 0429  01/20/20 0637 01/21/20 0703  WBC 7.0 7.0 9.7 11.7*  NEUTROABS 6.0 6.3 8.5* 10.7*  HGB 13.6 13.2 12.7* 12.1*  HCT 38.7* 37.8* 36.1* 34.4*  MCV 85.6  86.3 84.3 85.1  PLT 255 255 291 597   Basic Metabolic Panel: Recent Labs  Lab 01/18/20 2138 01/19/20 0429 01/20/20 0637 01/21/20 0703  NA 133* 133* 139 138  K 3.6 3.5 4.3 4.1  CL 100 102 105 104  CO2 23 21* 26 27  GLUCOSE 119* 122* 162* 124*  BUN _0 26*  CREATININE 0.95 0.71 0.81 0.76  CALCIUM 8.3* 7.9* 8.6* 8.3*  MG  --  2.0 2.5* 2.3  PHOS  --  3.0 3.6 3.6   GFR: Estimated Creatinine Clearance: 120.1 mL/min (by C-G formula based on SCr of 0.76 mg/dL). Liver Function Tests: Recent Labs  Lab 01/19/20 0429 01/20/20 0637 01/21/20 0703  AST 78* 75* 139*  ALT 58* 67* 149*  ALKPHOS 90 84 78  BILITOT 1.3* 0.7 0.7  PROT 6.0* 6.0* 5.8*  ALBUMIN 2.5* 2.5* 2.4*   No results for input(s): LIPASE, AMYLASE in the last 168 hours. No results for input(s): AMMONIA in the last 168 hours. Coagulation Profile: Recent Labs  Lab 01/18/20 2138  INR 1.1   Cardiac Enzymes: No results for input(s): CKTOTAL, CKMB, CKMBINDEX, TROPONINI in the last 168 hours. BNP (last 3 results) No results for input(s): PROBNP in the last 8760 hours. HbA1C: No results for input(s): HGBA1C in the last 72 hours. CBG: No results for input(s): GLUCAP in the last 168 hours. Lipid Profile: No results for input(s): CHOL, HDL, LDLCALC, TRIG, CHOLHDL, LDLDIRECT in the last 72 hours. Thyroid Function Tests: No results for input(s): TSH, T4TOTAL, FREET4, T3FREE, THYROIDAB in the last 72 hours. Anemia Panel: Recent Labs    01/20/20 0637 01/21/20 0703  FERRITIN 783* 696*   Sepsis Labs: Recent Labs  Lab 01/19/20 0429  PROCALCITON 0.23    No results found for this or any previous visit (from the past 240 hour(s)).       Radiology Studies: No results found.      Scheduled Meds: . apixaban  10 mg Oral BID   Followed by  . [START ON 01/27/2020] apixaban  5 mg Oral BID  . vitamin C  500 mg Oral Daily  . Ipratropium-Albuterol  1 puff Inhalation Q6H  . lactulose  20 g Oral Once  .  methylPREDNISolone (SOLU-MEDROL) injection  40 mg Intravenous Q12H  . sodium chloride flush  3 mL Intravenous Q12H  . zinc sulfate  220 mg Oral Daily   Continuous Infusions: . ceFEPime (MAXIPIME) IV 2 g (01/21/20 4163)     LOS: 3 days    Time spent: 28 minutes    Sharen Hones, MD [facility]   To contact the attending provider between 7A-7P or the covering provider during after hours 7P-7A, please log into the web site [email or web address] and access using universal [place] password for that web site. If you do not have the password, please call the hospital operator.  01/21/2020, 3:46 PM

## 2020-01-21 NOTE — Discharge Instructions (Signed)
Your chest xray shows some worsening compared to before. This may be due to the effects of Covid, or due to a developing pneumonia.  We gave antibiotics in the ED tonight, and you should take Augmentin for the next 10 days as well.  Continue monitoring your symptoms, and return to the hospital if you have high fever above 102, inability to eat or drink, persistent vomiting, severe shortness of breath, or other new concerns.    Information on my medicine - ELIQUIS (apixaban)  This medication education was reviewed with me or my healthcare representative as part of my discharge preparation.    Why was Eliquis prescribed for you? Eliquis was prescribed to treat blood clots that may have been found in the veins of your legs (deep vein thrombosis) or in your lungs (pulmonary embolism) and to reduce the risk of them occurring again.  What do You need to know about Eliquis ? The starting dose is 10 mg (two 5 mg tablets) taken TWICE daily for the FIRST SEVEN (7) DAYS, then on 01/27/20  the dose is reduced to ONE 5 mg tablet taken TWICE daily.  Eliquis may be taken with or without food.   Try to take the dose about the same time in the morning and in the evening. If you have difficulty swallowing the tablet whole please discuss with your pharmacist how to take the medication safely.  Take Eliquis exactly as prescribed and DO NOT stop taking Eliquis without talking to the doctor who prescribed the medication.  Stopping may increase your risk of developing a new blood clot.  Refill your prescription before you run out.  After discharge, you should have regular check-up appointments with your healthcare provider that is prescribing your Eliquis.    What do you do if you miss a dose? If a dose of ELIQUIS is not taken at the scheduled time, take it as soon as possible on the same day and twice-daily administration should be resumed. The dose should not be doubled to make up for a missed  dose.  Important Safety Information A possible side effect of Eliquis is bleeding. You should call your healthcare provider right away if you experience any of the following: ? Bleeding from an injury or your nose that does not stop. ? Unusual colored urine (red or dark brown) or unusual colored stools (red or black). ? Unusual bruising for unknown reasons. ? A serious fall or if you hit your head (even if there is no bleeding).  Some medicines may interact with Eliquis and might increase your risk of bleeding or clotting while on Eliquis. To help avoid this, consult your healthcare provider or pharmacist prior to using any new prescription or non-prescription medications, including herbals, vitamins, non-steroidal anti-inflammatory drugs (NSAIDs) and supplements.  This website has more information on Eliquis (apixaban): http://[email or web address].com/[email or web address]

## 2020-01-21 NOTE — Evaluation (Signed)
Physical Therapy Evaluation Patient Details Name: Daray Crumpley MRN: [id number] DOB: 07-Oct-1945 Today's Date: 01/21/2020   History of Present Illness  presented to ER secondary to worsening cough, SOB, increased WOB; admitted for management of acute hypoxemic respiratory failure related to COVID-19 PNA, bilat PE (anticoagulation initiated 5/19 0020).  Clinical Impression  Upon evaluation, patient alert and oriented to basic information; follows simple commands, but does display intermittent confusion at times during evaluation.  Intermittently asking non-sensical, unrelated questions (I.e., "were they pouring concrete today?").  Bilat UE/LE strength and ROM grossly symmetrical and WFL; no focal weakness appreciated.  Able to complete bed mobility with mod indep; sit/stand, basic transfers and gait (50') without assist device.  Demonstrates short, shuffling steps with decreased step height/length; mild stagger x1 (towards R), but able to self-correct without physical assist from therapist.  Does desat to 84-85% on 2L with exertion, requiring 45-60 seconds of seated rest for recovery >90%; RN informed/aware.  Will continue to progress balance activities and overall gait distance/activity tolerance as able. Would benefit from skilled PT to address above deficits and promote optimal return to PLOF.; Recommend transition to HHPT upon discharge from acute hospitalization.     Follow Up Recommendations Home health PT    Equipment Recommendations       Recommendations for Other Services       Precautions / Restrictions Precautions Precautions: Fall Restrictions Weight Bearing Restrictions: No      Mobility  Bed Mobility Overal bed mobility: Modified Independent                Transfers Overall transfer level: Needs assistance   Transfers: Sit to/from Stand Sit to Stand: Supervision         General transfer comment: fair/good LE strength/power with movement  transition  Ambulation/Gait Ambulation/Gait assistance: Min guard;Supervision Gait Distance (Feet): (50' x1, 15' x1) Assistive device: None       General Gait Details: short, shuffling steps with decreased step height/length; mild stagger x1 (towards R), but able to self-correct without physical assist from therapist.  Does desat to 84-85% on 2L with exertion, requiring 45-60 seconds of seated rest for recovery >90%.  Stairs            Wheelchair Mobility    Modified Rankin (Stroke Patients Only)       Balance Overall balance assessment: Needs assistance Sitting-balance support: No upper extremity supported;Feet supported Sitting balance-Leahy Scale: Good     Standing balance support: No upper extremity supported Standing balance-Leahy Scale: Fair Standing balance comment: decreased ankle and hip strategy noted in periods of SLS, requiring UE support for balance/stabilization                             Pertinent Vitals/Pain Pain Assessment: No/denies pain    Home Living Family/patient expects to be discharged to:: Private residence Living Arrangements: Spouse/significant other Available Help at Discharge: Family Type of Home: House Home Access: Stairs to enter   Technical brewer of Steps: 3 Home Layout: One level Home Equipment: None      Prior Function Level of Independence: Independent         Comments: Indep with ADLs, household and community mobilization; intermittent use of SPC.  Denies fall history.  No home O2 at baseline.     Hand Dominance        Extremity/Trunk Assessment   Upper Extremity Assessment Upper Extremity Assessment: Overall WFL for tasks assessed    Lower  Extremity Assessment Lower Extremity Assessment: Overall WFL for tasks assessed       Communication   Communication: HOH  Cognition Arousal/Alertness: Awake/alert Behavior During Therapy: WFL for tasks assessed/performed                                    General Comments: intermittent confusion noted, relying on communication board for location/date, occasionally asking non-sensical, unrelated questions during session (i.e, "were they pouring concrete today?")      General Comments      Exercises Other Exercises Other Exercises: Educated in role of PT, pursed lip breathing techniques, importance/value of OOB in chair for pulmonary hygiene, overall safety with mobility. Patient voiced undestanding; will continue to reinforce as needed.   Assessment/Plan    PT Assessment Patient needs continued PT services  PT Problem List Decreased activity tolerance;Decreased balance;Decreased mobility;Decreased coordination;Decreased cognition;Decreased knowledge of use of DME;Decreased safety awareness;Decreased knowledge of precautions;Cardiopulmonary status limiting activity       PT Treatment Interventions DME instruction;Gait training;Stair training;Functional mobility training;Therapeutic activities;Therapeutic exercise;Balance training;Cognitive remediation;Patient/family education    PT Goals (Current goals can be found in the Care Plan section)  Acute Rehab PT Goals Patient Stated Goal: to sit up in chair, look out the window PT Goal Formulation: With patient Time For Goal Achievement: 02/04/20 Potential to Achieve Goals: Good    Frequency Min 2X/week   Barriers to discharge        Co-evaluation               AM-PAC PT "6 Clicks" Mobility  Outcome Measure Help needed turning from your back to your side while in a flat bed without using bedrails?: None Help needed moving from lying on your back to sitting on the side of a flat bed without using bedrails?: None Help needed moving to and from a bed to a chair (including a wheelchair)?: A Little Help needed standing up from a chair using your arms (e.g., wheelchair or bedside chair)?: A Little Help needed to walk in hospital room?: A Little Help needed  climbing 3-5 steps with a railing? : A Little 6 Click Score: 20    End of Session Equipment Utilized During Treatment: Oxygen Activity Tolerance: Patient tolerated treatment well Patient left: in chair;with call bell/phone within reach;with chair alarm set Nurse Communication: Mobility status PT Visit Diagnosis: Difficulty in walking, not elsewhere classified (R26.2);Muscle weakness (generalized) (M62.81)    Time: [id number] PT Time Calculation (min) (ACUTE ONLY): 41 min   Charges:   PT Evaluation $PT Eval Moderate Complexity: 1 Mod PT Treatments $Therapeutic Activity: 8-22 mins       Madia Carvell H. Owens Shark, PT, DPT, NCS 01/21/20, 4:06 PM [phone]

## 2020-01-22 LAB — CBC WITH DIFFERENTIAL/PLATELET
Abs Immature Granulocytes: 0.23 10*3/uL — ABNORMAL HIGH (ref 0.00–0.07)
Basophils Absolute: 0 10*3/uL (ref 0.0–0.1)
Basophils Relative: 0 %
Eosinophils Absolute: 0 10*3/uL (ref 0.0–0.5)
Eosinophils Relative: 0 %
HCT: 33.8 % — ABNORMAL LOW (ref 39.0–52.0)
Hemoglobin: 11.9 g/dL — ABNORMAL LOW (ref 13.0–17.0)
Immature Granulocytes: 2 %
Lymphocytes Relative: 5 %
Lymphs Abs: 0.6 10*3/uL — ABNORMAL LOW (ref 0.7–4.0)
MCH: 29.7 pg (ref 26.0–34.0)
MCHC: 35.2 g/dL (ref 30.0–36.0)
MCV: 84.3 fL (ref 80.0–100.0)
Monocytes Absolute: 0.4 10*3/uL (ref 0.1–1.0)
Monocytes Relative: 4 %
Neutro Abs: 9.7 10*3/uL — ABNORMAL HIGH (ref 1.7–7.7)
Neutrophils Relative %: 89 %
Platelets: 266 10*3/uL (ref 150–400)
RBC: 4.01 MIL/uL — ABNORMAL LOW (ref 4.22–5.81)
RDW: 14.9 % (ref 11.5–15.5)
WBC: 10.9 10*3/uL — ABNORMAL HIGH (ref 4.0–10.5)
nRBC: 0 % (ref 0.0–0.2)

## 2020-01-22 LAB — COMPREHENSIVE METABOLIC PANEL
ALT: 135 U/L — ABNORMAL HIGH (ref 0–44)
AST: 92 U/L — ABNORMAL HIGH (ref 15–41)
Albumin: 2.4 g/dL — ABNORMAL LOW (ref 3.5–5.0)
Alkaline Phosphatase: 72 U/L (ref 38–126)
Anion gap: 8 (ref 5–15)
BUN: 26 mg/dL — ABNORMAL HIGH (ref 8–23)
CO2: 27 mmol/L (ref 22–32)
Calcium: 8.3 mg/dL — ABNORMAL LOW (ref 8.9–10.3)
Chloride: 102 mmol/L (ref 98–111)
Creatinine, Ser: 0.78 mg/dL (ref 0.61–1.24)
GFR calc Af Amer: >60 mL/min (ref >60)
GFR calc non Af Amer: >60 mL/min (ref >60)
Glucose, Bld: 130 mg/dL — ABNORMAL HIGH (ref 70–99)
Potassium: 4.4 mmol/L (ref 3.5–5.1)
Sodium: 137 mmol/L (ref 135–145)
Total Bilirubin: 0.8 mg/dL (ref 0.3–1.2)
Total Protein: 5.5 g/dL — ABNORMAL LOW (ref 6.5–8.1)

## 2020-01-22 LAB — FERRITIN: Ferritin: 362 ng/mL — ABNORMAL HIGH (ref 24–336)

## 2020-01-22 LAB — C-REACTIVE PROTEIN: CRP: 3.1 mg/dL — ABNORMAL HIGH (ref <1.0)

## 2020-01-22 LAB — MAGNESIUM: Magnesium: 2.1 mg/dL (ref 1.7–2.4)

## 2020-01-22 LAB — PHOSPHORUS: Phosphorus: 3.8 mg/dL (ref 2.5–4.6)

## 2020-01-22 LAB — FIBRIN DERIVATIVES D-DIMER (ARMC ONLY): Fibrin derivatives D-dimer (ARMC): 4098.06 ng/mL (FEU) — ABNORMAL HIGH (ref 0.00–499.00)

## 2020-01-22 NOTE — Progress Notes (Addendum)
PROGRESS NOTE    Geoffrey Wells  [id number] DOB: 12-12-1945 DOA: 01/18/2020 PCP: [facility] [place]    Brief Narrative: Geoffrey Wells a 74 y.o.malewith medical history significant forrecently diagnosed COVID-19 pneumonia who presents to the ED for worsening shortness of breath. Patient had a positive COVID-19 test on 01/10/2020 and was admitted at [facility] hospital from 01/10/2020-01/12/2020 for acute respiratory failure with hypoxemia due to COVID-19 pneumonia.  He was started on treatment with IV remdesivir and IV Decadron. He was discharged on 01/12/2020 on 2 L supplemental O2 via [place] and completed 5 days total outpatient IV remdesivir treatment at the Covid infusion clinic in [place]. Does not appear he was discharged with steroids.  Patient came back to the hospital again with increased short of breath, hypoxemia, currently he is on 5 L oxygen. Chest CT scan showed bilateral PE, bilateral lungs groundglass consolidation, worse on the right side. He is placed on heparin drip, restarted IV steroids.  Anticoagulation since has changed to Eliquis.   Assessment & Plan:   Principal Problem:   Acute hypoxemic respiratory failure due to COVID-19 [facility]) Active Problems:   Bilateral pulmonary embolism ([place])  #1.  Acute hypoxemic respiratory failure secondary to Covid pneumonia and bilateral PE.   A component of bacterial infection.  Condition had improved.  Currently patient is back on 2 L oxygen.  He was last discharged from the hospital with 2 L oxygen.  He will be monitor 1 more day with IV antibiotics, probable discharge home tomorrow.  2.  Acute bilateral PE.  Secondary to COVID-19. Continue Eliquis.  3.  Covid viral pneumonia. Patient also has secondary bacterial pneumonia.  Continue cefepime.  Continue steroids.  4.  Hyponatremia. Resolved.  5.  Essential hypertension. Continue to follow.  6.  Constipation. Had a bowel movement after lactulose.      DVT  prophylaxis:Eliquis Code Status:Full Family Communication:None in room Disposition Plan:  Patient came from:Home  Anticipated d/c place:Home  Barriers to d/c OR conditions which need to be met to effect a safe d/c: Will need home care after discharge  Consultants:  None  Procedures:None Antimicrobials:Cefepime   Subjective: Patient currently on 2 L oxygen, no significant short of breath today. Had a bowel movement after stool softener. Denies any abdominal pain no nausea vomiting. No dysuria hematuria.  Objective: Vitals:   01/21/20 0539 01/21/20 0908 01/21/20 2331 01/22/20 0740  BP: (!) 148/72 (!) 150/105 139/80 (!) 151/77  Pulse: 63 78 68 60  Resp: 19 19 17 16   Temp: 98.1 F (36.7 C) (!) 97.5 F (36.4 C) 97.9 F (36.6 C) 97.8 F (36.6 C)  TempSrc:  Axillary  Oral  SpO2: 92% 91% 91% 91%  Weight:      Height:        Intake/Output Summary (Last 24 hours) at 01/22/2020 1305 Last data filed at 01/22/2020 1017 Gross per 24 hour  Intake 920 ml  Output 1550 ml  Net -630 ml   Filed Weights   01/18/20 2127 01/19/20 0758  Weight: 134.3 kg 134.3 kg    Examination:  General exam: Appears calm and comfortable  Respiratory system: Clear to auscultation. Respiratory effort normal. Cardiovascular system: S1 & S2 heard, RRR. No JVD, murmurs, rubs, gallops or clicks. No pedal edema. Gastrointestinal system: Abdomen is nondistended, soft and nontender. No organomegaly or masses felt. Normal bowel sounds heard. Central nervous system: Alert and oriented. No focal neurological deficits. Extremities: Symmetric 5 x 5 power. Skin: No rashes, lesions or ulcers Psychiatry:  Judgement and insight appear normal. Mood & affect appropriate.     Data Reviewed: I have personally reviewed following labs and imaging studies  CBC: Recent Labs  Lab  01/18/20 2138 01/19/20 0429 01/20/20 0637 01/21/20 0703 01/22/20 0626  WBC 7.0 7.0 9.7 11.7* 10.9*  NEUTROABS 6.0 6.3 8.5* 10.7* 9.7*  HGB 13.6 13.2 12.7* 12.1* 11.9*  HCT 38.7* 37.8* 36.1* 34.4* 33.8*  MCV 85.6 86.3 84.3 85.1 84.3  PLT 255 255 291 305 801   Basic Metabolic Panel: Recent Labs  Lab 01/18/20 2138 01/19/20 0429 01/20/20 0637 01/21/20 0703 01/22/20 0626  NA 133* 133* 139 138 137  K 3.6 3.5 4.3 4.1 4.4  CL 100 102 105 104 102  CO2 23 21* 26 27 27   GLUCOSE 119* 122* 162* 124* 130*  BUN 21 15 20  26* 26*  CREATININE 0.95 0.71 0.81 0.76 0.78  CALCIUM 8.3* 7.9* 8.6* 8.3* 8.3*  MG  --  2.0 2.5* 2.3 2.1  PHOS  --  3.0 3.6 3.6 3.8   GFR: Estimated Creatinine Clearance: 120.1 mL/min (by C-G formula based on SCr of 0.78 mg/dL). Liver Function Tests: Recent Labs  Lab 01/19/20 0429 01/20/20 0637 01/21/20 0703 01/22/20 0626  AST 78* 75* 139* 92*  ALT 58* 67* 149* 135*  ALKPHOS 90 84 78 72  BILITOT 1.3* 0.7 0.7 0.8  PROT 6.0* 6.0* 5.8* 5.5*  ALBUMIN 2.5* 2.5* 2.4* 2.4*   No results for input(s): LIPASE, AMYLASE in the last 168 hours. No results for input(s): AMMONIA in the last 168 hours. Coagulation Profile: Recent Labs  Lab 01/18/20 2138  INR 1.1   Cardiac Enzymes: No results for input(s): CKTOTAL, CKMB, CKMBINDEX, TROPONINI in the last 168 hours. BNP (last 3 results) No results for input(s): PROBNP in the last 8760 hours. HbA1C: No results for input(s): HGBA1C in the last 72 hours. CBG: No results for input(s): GLUCAP in the last 168 hours. Lipid Profile: No results for input(s): CHOL, HDL, LDLCALC, TRIG, CHOLHDL, LDLDIRECT in the last 72 hours. Thyroid Function Tests: No results for input(s): TSH, T4TOTAL, FREET4, T3FREE, THYROIDAB in the last 72 hours. Anemia Panel: Recent Labs    01/21/20 0703 01/22/20 0626  FERRITIN 696* 362*   Sepsis Labs: Recent Labs  Lab 01/19/20 0429  PROCALCITON 0.23    No results found for this or any  previous visit (from the past 240 hour(s)).       Radiology Studies: No results found.      Scheduled Meds: . apixaban  10 mg Oral BID   Followed by  . [START ON 01/27/2020] apixaban  5 mg Oral BID  . vitamin C  500 mg Oral Daily  . Ipratropium-Albuterol  1 puff Inhalation Q6H  . methylPREDNISolone (SOLU-MEDROL) injection  40 mg Intravenous Q12H  . sodium chloride flush  3 mL Intravenous Q12H  . zinc sulfate  220 mg Oral Daily   Continuous Infusions: . ceFEPime (MAXIPIME) IV 2 g (01/22/20 0635)     LOS: 4 days    Time spent: 28 mins    Sharen Hones, MD [facility]   To contact the attending provider between 7A-7P or the covering provider during after hours 7P-7A, please log into the web site [email or web address] and access using universal [place] password for that web site. If you do not have the password, please call the hospital operator.  01/22/2020, 1:05 PM

## 2020-01-23 ENCOUNTER — Inpatient Hospital Stay: Payer: No Typology Code available for payment source

## 2020-01-23 LAB — CBC WITH DIFFERENTIAL/PLATELET
Abs Immature Granulocytes: 0.26 10*3/uL — ABNORMAL HIGH (ref 0.00–0.07)
Basophils Absolute: 0 10*3/uL (ref 0.0–0.1)
Basophils Relative: 0 %
Eosinophils Absolute: 0 10*3/uL (ref 0.0–0.5)
Eosinophils Relative: 0 %
HCT: 34.5 % — ABNORMAL LOW (ref 39.0–52.0)
Hemoglobin: 11.9 g/dL — ABNORMAL LOW (ref 13.0–17.0)
Immature Granulocytes: 2 %
Lymphocytes Relative: 6 %
Lymphs Abs: 0.7 10*3/uL (ref 0.7–4.0)
MCH: 29.8 pg (ref 26.0–34.0)
MCHC: 34.5 g/dL (ref 30.0–36.0)
MCV: 86.5 fL (ref 80.0–100.0)
Monocytes Absolute: 0.5 10*3/uL (ref 0.1–1.0)
Monocytes Relative: 4 %
Neutro Abs: 9.5 10*3/uL — ABNORMAL HIGH (ref 1.7–7.7)
Neutrophils Relative %: 88 %
Platelets: 310 10*3/uL (ref 150–400)
RBC: 3.99 MIL/uL — ABNORMAL LOW (ref 4.22–5.81)
RDW: 14.7 % (ref 11.5–15.5)
WBC: 10.9 10*3/uL — ABNORMAL HIGH (ref 4.0–10.5)
nRBC: 0 % (ref 0.0–0.2)

## 2020-01-23 LAB — COMPREHENSIVE METABOLIC PANEL
ALT: 122 U/L — ABNORMAL HIGH (ref 0–44)
AST: 67 U/L — ABNORMAL HIGH (ref 15–41)
Albumin: 2.6 g/dL — ABNORMAL LOW (ref 3.5–5.0)
Alkaline Phosphatase: 69 U/L (ref 38–126)
Anion gap: 8 (ref 5–15)
BUN: 25 mg/dL — ABNORMAL HIGH (ref 8–23)
CO2: 29 mmol/L (ref 22–32)
Calcium: 8.5 mg/dL — ABNORMAL LOW (ref 8.9–10.3)
Chloride: 102 mmol/L (ref 98–111)
Creatinine, Ser: 0.66 mg/dL (ref 0.61–1.24)
GFR calc Af Amer: >60 mL/min (ref >60)
GFR calc non Af Amer: >60 mL/min (ref >60)
Glucose, Bld: 129 mg/dL — ABNORMAL HIGH (ref 70–99)
Potassium: 4.4 mmol/L (ref 3.5–5.1)
Sodium: 139 mmol/L (ref 135–145)
Total Bilirubin: 0.9 mg/dL (ref 0.3–1.2)
Total Protein: 5.8 g/dL — ABNORMAL LOW (ref 6.5–8.1)

## 2020-01-23 LAB — PHOSPHORUS: Phosphorus: 3.9 mg/dL (ref 2.5–4.6)

## 2020-01-23 LAB — FIBRIN DERIVATIVES D-DIMER (ARMC ONLY): Fibrin derivatives D-dimer (ARMC): 3180.37 ng/mL (FEU) — ABNORMAL HIGH (ref 0.00–499.00)

## 2020-01-23 LAB — FERRITIN: Ferritin: 356 ng/mL — ABNORMAL HIGH (ref 24–336)

## 2020-01-23 LAB — C-REACTIVE PROTEIN: CRP: 1.7 mg/dL — ABNORMAL HIGH (ref <1.0)

## 2020-01-23 LAB — BRAIN NATRIURETIC PEPTIDE: B Natriuretic Peptide: 182.8 pg/mL — ABNORMAL HIGH (ref 0.0–100.0)

## 2020-01-23 LAB — MAGNESIUM: Magnesium: 2.3 mg/dL (ref 1.7–2.4)

## 2020-01-23 MED ORDER — ORAL CARE MOUTH RINSE
15.0000 mL | Freq: Two times a day (BID) | OROMUCOSAL | Status: DC
  Administered 2020-01-24 – 2020-01-28 (×9): 15 mL via OROMUCOSAL

## 2020-01-23 NOTE — Progress Notes (Signed)
PROGRESS NOTE    Geoffrey Wells  OEV:[id number] DOB: 03-25-1946 DOA: 01/18/2020 PCP: [facility] [place]   Chief complaint: Hypoxia   Brief Narrative:  [name] a 74 y.o.malewith medical history significant forrecently diagnosed COVID-19 pneumonia who presents to the ED for worsening shortness of breath. Patient had a positive COVID-19 test on 01/10/2020 and was admitted at [facility] hospital from 01/10/2020-01/12/2020 for acute respiratory failure with hypoxemia due to COVID-19 pneumonia.  He was started on treatment with IV remdesivir and IV Decadron. He was discharged on 01/12/2020 on 2 L supplemental O2 via [place] and completed 5 days total outpatient IV remdesivir treatment at the Covid infusion clinic in [place]. Does not appear he was discharged with steroids.  Patient came back to the hospital again with increased short of breath, hypoxemia, currently he is on 5 L oxygen. Chest CT scan showed bilateral PE, bilateral lungs groundglass consolidation, worse on the right side. He is placed on heparin drip, restarted IV steroids.  Anticoagulation since has changed to Eliquis.  5/23.  Patient was put back on 5 L oxygen last night after initial improvement to 2 L.   Assessment & Plan:   Principal Problem:   Acute hypoxemic respiratory failure due to COVID-19 [facility]) Active Problems:   Bilateral pulmonary embolism ([place])  #1.  Acute hypoxemic respiratory failure secondary to Covid pneumonia, bilateral PE and possible secondary bacterial pneumonia. Patient oxygenation was improved yesterday, however, overnight patient was placed on 5 L oxygen again due to hypoxemia.  Patient does not feel short of breath.  Etiology is unclear, will obtain chest x-ray again, recheck a procalcitonin level.  BNP is not significantly elevated, patient does not have volume overload.  For now, I will continue IV steroids, cefepime to cover for bacterial pneumonia.  Continue current oxygen treatment.  Will  monitor patient closely.  2.  Acute bilateral PE.  Secondary to COVID-19. Continue Eliquis.  3.  Covid viral pneumonia with secondary bacterial pneumonia. Continue cefepime and steroids.  Repeat procalcitonin level.  4.  Hyponatremia. Resolved.  5.  Essential hypertension.  Continue to follow.   DVT prophylaxis:Eliquis Code Status:Full Family Communication:I had a long discussion with the patient wife as well as his son yesterday afternoon, all questions answered. Disposition Plan:  Patient came from:Home  Anticipated d/c place:Home  Barriers to d/c OR conditions which need to be met to effect a safe d/c: Will need home care after discharge  Consultants:  None  Procedures:None Antimicrobials:Cefepime   Subjective: Patient oxygen requirement with increased to 5 L last night.  However, patient does not feel shortness of breath.  He has minimal cough, nonproductive.  Does not have any chest pain. No diarrhea nausea vomiting.  No fever or chills.  Objective: Vitals:   01/23/20 0059 01/23/20 0100 01/23/20 0210 01/23/20 0904  BP: (!) 175/70  (!) 152/68 (!) 143/74  Pulse: 70  73 75  Resp: _0 Temp:  97.8 F (36.6 C)  98.1 F (36.7 C)  TempSrc:  Oral  Oral  SpO2: 94%  95% 93%  Weight:      Height:        Intake/Output Summary (Last 24 hours) at 01/23/2020 1245 Last data filed at 01/23/2020 0616 Gross per 24 hour  Intake --  Output 2175 ml  Net -2175 ml   Filed Weights   01/18/20 2127 01/19/20 0758  Weight: 134.3 kg 134.3 kg    Examination:  General exam: Appears calm and comfortable  Respiratory  system: Clear to auscultation. Respiratory effort normal. Cardiovascular system: S1 & S2 heard, RRR. No JVD, murmurs, rubs, gallops or clicks. No pedal edema. Gastrointestinal system: Abdomen is nondistended, soft and  nontender. No organomegaly or masses felt. Normal bowel sounds heard. Central nervous system: Alert and oriented. No focal neurological deficits. Extremities: Symmetric Skin: No rashes, lesions or ulcers Psychiatry: Judgement and insight appear normal. Mood & affect appropriate.     Data Reviewed: I have personally reviewed following labs and imaging studies  CBC: Recent Labs  Lab 01/19/20 0429 01/20/20 0637 01/21/20 0703 01/22/20 0626 01/23/20 0548  WBC 7.0 9.7 11.7* 10.9* 10.9*  NEUTROABS 6.3 8.5* 10.7* 9.7* 9.5*  HGB 13.2 12.7* 12.1* 11.9* 11.9*  HCT 37.8* 36.1* 34.4* 33.8* 34.5*  MCV 86.3 84.3 85.1 84.3 86.5  PLT 255 291 305 266 357   Basic Metabolic Panel: Recent Labs  Lab 01/19/20 0429 01/20/20 0637 01/21/20 0703 01/22/20 0626 01/23/20 0548  NA 133* 139 138 137 139  K 3.5 4.3 4.1 4.4 4.4  CL 102 105 104 102 102  CO2 21* _0 GLUCOSE 122* 162* 124* 130* 129*  BUN 15 20 26* 26* 25*  CREATININE 0.71 0.81 0.76 0.78 0.66  CALCIUM 7.9* 8.6* 8.3* 8.3* 8.5*  MG 2.0 2.5* 2.3 2.1 2.3  PHOS 3.0 3.6 3.6 3.8 3.9   GFR: Estimated Creatinine Clearance: 120.1 mL/min (by C-G formula based on SCr of 0.66 mg/dL). Liver Function Tests: Recent Labs  Lab 01/19/20 0429 01/20/20 8978 01/21/20 0703 01/22/20 0626 01/23/20 0548  AST 78* 75* 139* 92* 67*  ALT 58* 67* 149* 135* 122*  ALKPHOS 90 84 78 72 69  BILITOT 1.3* 0.7 0.7 0.8 0.9  PROT 6.0* 6.0* 5.8* 5.5* 5.8*  ALBUMIN 2.5* 2.5* 2.4* 2.4* 2.6*   No results for input(s): LIPASE, AMYLASE in the last 168 hours. No results for input(s): AMMONIA in the last 168 hours. Coagulation Profile: Recent Labs  Lab 01/18/20 2138  INR 1.1   Cardiac Enzymes: No results for input(s): CKTOTAL, CKMB, CKMBINDEX, TROPONINI in the last 168 hours. BNP (last 3 results) No results for input(s): PROBNP in the last 8760 hours. HbA1C: No results for input(s): HGBA1C in the last 72 hours. CBG: No results for input(s): GLUCAP in  the last 168 hours. Lipid Profile: No results for input(s): CHOL, HDL, LDLCALC, TRIG, CHOLHDL, LDLDIRECT in the last 72 hours. Thyroid Function Tests: No results for input(s): TSH, T4TOTAL, FREET4, T3FREE, THYROIDAB in the last 72 hours. Anemia Panel: Recent Labs    01/22/20 0626 01/23/20 0548  FERRITIN 362* 356*   Sepsis Labs: Recent Labs  Lab 01/19/20 0429  PROCALCITON 0.23    No results found for this or any previous visit (from the past 240 hour(s)).       Radiology Studies: No results found.      Scheduled Meds: . apixaban  10 mg Oral BID   Followed by  . [START ON 01/27/2020] apixaban  5 mg Oral BID  . vitamin C  500 mg Oral Daily  . Ipratropium-Albuterol  1 puff Inhalation Q6H  . methylPREDNISolone (SOLU-MEDROL) injection  40 mg Intravenous Q12H  . sodium chloride flush  3 mL Intravenous Q12H  . zinc sulfate  220 mg Oral Daily   Continuous Infusions: . ceFEPime (MAXIPIME) IV 2 g (01/23/20 4784)     LOS: 5 days    Time spent: 35 minutes    Sharen Hones, MD [facility]   To contact the  attending provider between 7A-7P or the covering provider during after hours 7P-7A, please log into the web site [email or web address] and access using universal [place] password for that web site. If you do not have the password, please call the hospital operator.  01/23/2020, 12:45 PM

## 2020-01-24 LAB — CBC WITH DIFFERENTIAL/PLATELET
Abs Immature Granulocytes: 0.35 10*3/uL — ABNORMAL HIGH (ref 0.00–0.07)
Basophils Absolute: 0 10*3/uL (ref 0.0–0.1)
Basophils Relative: 0 %
Eosinophils Absolute: 0 10*3/uL (ref 0.0–0.5)
Eosinophils Relative: 0 %
HCT: 38.7 % — ABNORMAL LOW (ref 39.0–52.0)
Hemoglobin: 13 g/dL (ref 13.0–17.0)
Immature Granulocytes: 2 %
Lymphocytes Relative: 8 %
Lymphs Abs: 1.2 10*3/uL (ref 0.7–4.0)
MCH: 29.8 pg (ref 26.0–34.0)
MCHC: 33.6 g/dL (ref 30.0–36.0)
MCV: 88.8 fL (ref 80.0–100.0)
Monocytes Absolute: 0.5 10*3/uL (ref 0.1–1.0)
Monocytes Relative: 4 %
Neutro Abs: 12.9 10*3/uL — ABNORMAL HIGH (ref 1.7–7.7)
Neutrophils Relative %: 86 %
Platelets: 311 10*3/uL (ref 150–400)
RBC: 4.36 MIL/uL (ref 4.22–5.81)
RDW: 14.9 % (ref 11.5–15.5)
WBC: 15 10*3/uL — ABNORMAL HIGH (ref 4.0–10.5)
nRBC: 0.1 % (ref 0.0–0.2)

## 2020-01-24 LAB — BASIC METABOLIC PANEL
Anion gap: 9 (ref 5–15)
BUN: 28 mg/dL — ABNORMAL HIGH (ref 8–23)
CO2: 28 mmol/L (ref 22–32)
Calcium: 8.7 mg/dL — ABNORMAL LOW (ref 8.9–10.3)
Chloride: 101 mmol/L (ref 98–111)
Creatinine, Ser: 0.85 mg/dL (ref 0.61–1.24)
GFR calc Af Amer: >60 mL/min (ref >60)
GFR calc non Af Amer: >60 mL/min (ref >60)
Glucose, Bld: 119 mg/dL — ABNORMAL HIGH (ref 70–99)
Potassium: 4.9 mmol/L (ref 3.5–5.1)
Sodium: 138 mmol/L (ref 135–145)

## 2020-01-24 LAB — MAGNESIUM: Magnesium: 2.2 mg/dL (ref 1.7–2.4)

## 2020-01-24 LAB — PROCALCITONIN: Procalcitonin: <0.1 ng/mL

## 2020-01-24 NOTE — Progress Notes (Signed)
Physical Therapy Treatment Patient Details Name: Geoffrey Wells MRN: [id number] DOB: Feb 15, 1946 Today's Date: 01/24/2020    History of Present Illness presented to ER secondary to worsening cough, SOB, increased WOB; admitted for management of acute hypoxemic respiratory failure related to COVID-19 PNA, bilat PE (anticoagulation initiated 5/19 0020).    PT Comments    Pt sitting in bed 4 lpm at rest with sats high 90's.  Ready for session.  To EOB without assist.  Steady in sitting.  Stood and is able to walk to door and back with RW and min guard.  Sats decrease to hihg 80's but recover with seated rest.  He is able to stand and complete 1/2 of standing exercises below after extended rest with sats decreasing to 79.  Pt with extended recovery time despite deep breathing and cues to focus on breathing vs talking.  He struggled to recover and O2 was increased to 6 lpm where he was abl to return to baseline.  Finished standing exercises with increased O2 support and after a seated rest, he is able to walk to the door and back with same assist.  Fatigued with session but overall tolerated well.  Returned to 4 lpm after session and was able to maintain sats at rest.  Extensive education regarding pacing and energy conservation techniques along with monitoring O2 sats and recovery time.  Voiced understanding.  Son called per request and also discussed with him progress in therapy session and education.  Questions answered as appropriate.    Follow Up Recommendations  Home health PT     Equipment Recommendations  Rolling walker with 5" wheels    Recommendations for Other Services       Precautions / Restrictions Precautions Precautions: Fall Restrictions Weight Bearing Restrictions: No    Mobility  Bed Mobility Overal bed mobility: Modified Independent                Transfers Overall transfer level: Needs assistance Equipment used: Rolling walker (2 wheeled) Transfers: Sit to/from  Stand Sit to Stand: Supervision            Ambulation/Gait Ambulation/Gait assistance: Min guard;Supervision Gait Distance (Feet): 10 Feet Assistive device: Rolling walker (2 wheeled) Gait Pattern/deviations: Step-through pattern;Decreased step length - right;Decreased step length - left;Trunk flexed;Narrow base of support Gait velocity: decreased   General Gait Details: walked x 2 in room with RW and min gaurd.  generally steady with rw and min gaurd limited by O2 support and fatigue.   Stairs             Wheelchair Mobility    Modified Rankin (Stroke Patients Only)       Balance Overall balance assessment: Needs assistance Sitting-balance support: No upper extremity supported;Feet supported Sitting balance-Leahy Scale: Good     Standing balance support: No upper extremity supported Standing balance-Leahy Scale: Fair                              Cognition Arousal/Alertness: Awake/alert Behavior During Therapy: WFL for tasks assessed/performed Overall Cognitive Status: Within Functional Limits for tasks assessed                                        Exercises Other Exercises Other Exercises: standing ex with RW and min gaurd.  - heel raises, SLR, marches, x 10, minisquats x 5  General Comments        Pertinent Vitals/Pain Pain Assessment: No/denies pain    Home Living                      Prior Function            PT Goals (current goals can now be found in the care plan section) Progress towards PT goals: Progressing toward goals    Frequency    Min 2X/week      PT Plan Current plan remains appropriate    Co-evaluation              AM-PAC PT "6 Clicks" Mobility   Outcome Measure  Help needed turning from your back to your side while in a flat bed without using bedrails?: None Help needed moving from lying on your back to sitting on the side of a flat bed without using bedrails?:  None Help needed moving to and from a bed to a chair (including a wheelchair)?: A Little Help needed standing up from a chair using your arms (e.g., wheelchair or bedside chair)?: A Little Help needed to walk in hospital room?: A Little Help needed climbing 3-5 steps with a railing? : A Little 6 Click Score: 20    End of Session Equipment Utilized During Treatment: Oxygen;Gait belt Activity Tolerance: Patient tolerated treatment well Patient left: in bed;with bed alarm set;with call bell/phone within reach Nurse Communication: Mobility status       Time: 1500-1600 PT Time Calculation (min) (ACUTE ONLY): 60 min  Charges:  $Gait Training: 8-22 mins $Therapeutic Exercise: 23-37 mins $Therapeutic Activity: 8-22 mins                    Chesley Noon, PTA 01/24/20, 4:29 PM

## 2020-01-24 NOTE — Progress Notes (Signed)
PROGRESS NOTE    Geoffrey Wells  [id number] DOB: 1945-12-21 DOA: 01/18/2020 PCP: [facility] [place]   Chief complaint.  Shortness of breath. Brief Narrative:  Geoffrey Wells a 74 y.o.malewith medical history significant forrecently diagnosed COVID-19 pneumonia who presents to the ED for worsening shortness of breath. Patient had a positive COVID-19 test on 01/10/2020 and was admitted at [facility] hospital from 01/10/2020-01/12/2020 for acute respiratory failure with hypoxemia due to COVID-19 pneumonia.  He was started on treatment with IV remdesivir and IV Decadron. He was discharged on 01/12/2020 on 2 L supplemental O2 via [place] and completed 5 days total outpatient IV remdesivir treatment at the Covid infusion clinic in [place]. Does not appear he was discharged with steroids.  Patient came back to the hospital again with increased short of breath, hypoxemia, currently he is on 5 L oxygen. Chest CT scan showed bilateral PE, bilateral lungs groundglass consolidation, worse on the right side. He is placed on heparin drip, restarted IV steroids.Anticoagulation since has changed to Eliquis.  5/23.  Patient was put back on 5 L oxygen last night after initial improvement to 2 L.  Chest x-ray consistent with Covid pneumonia, pro BNP not significantly elevated.    Assessment & Plan:   Principal Problem:   Acute hypoxemic respiratory failure due to COVID-19 [facility]) Active Problems:   Bilateral pulmonary embolism ([place])  #1.  Acute hypoxemic respiratory failure secondary to Covid pneumonia. Oxygenation slightly better.  I personally reviewed patient chest x-ray images, does not seem to be worse compared to previous images.  proBNP is not elevated, procalcitonin level less than 0.01.  I will discontinue cefepime.  I will continue monitor patient closely.  2.  Acute bilateral PE.  Secondary to Covid infection. Continue Eliquis.  3.  Covid viral pneumonia. Discontinue cefepime as above.   Leukocytosis is secondary to IV steroids.  4.  Essential hypertension. Continue to follow.   DVT prophylaxis:Eliquis Code Status:Full Family Communication:None Disposition Plan:  Patient came from:Home  Anticipated d/c place:Home  Barriers to d/c OR conditions which need to be met to effect a safe d/c: Will need home careafterdischarge  Consultants:  None  Procedures:None Antimicrobials:None  Subjective: Oxygenation slightly better, no significant shortness of breath. Patient is able to walk to the bathroom, but with unsteady gait. No nausea vomiting or diarrhea. No fever or chills.  Objective: Vitals:   01/23/20 1654 01/24/20 0037 01/24/20 0038 01/24/20 0810  BP: 122/71 120/61  116/90  Pulse: 82 62  83  Resp: 17 17  16   Temp: 97.9 F (36.6 C) 97.8 F (36.6 C) 97.8 F (36.6 C) 98.5 F (36.9 C)  TempSrc: Oral Oral  Oral  SpO2: 96% 95%  94%  Weight:      Height:        Intake/Output Summary (Last 24 hours) at 01/24/2020 0923 Last data filed at 01/23/2020 2130 Gross per 24 hour  Intake -  Output 650 ml  Net -650 ml   Filed Weights   01/18/20 2127 01/19/20 0758  Weight: 134.3 kg 134.3 kg    Examination:  General exam: Appears calm and comfortable  Respiratory system: Clear to auscultation. Respiratory effort normal. Cardiovascular system: S1 & S2 heard, RRR. No JVD, murmurs, rubs, gallops or clicks. No pedal edema. Gastrointestinal system: Abdomen is nondistended, soft and nontender. No organomegaly or masses felt. Normal bowel sounds heard. Central nervous system: Alert and oriented. No focal neurological deficits. Extremities: Symmetric 5 x 5. Skin: No rashes, lesions or ulcers Psychiatry:  Judgement and insight appear normal. Mood & affect appropriate.     Data Reviewed: I have personally reviewed following labs and  imaging studies  CBC: Recent Labs  Lab 01/20/20 0637 01/21/20 0703 01/22/20 0626 01/23/20 0548 01/24/20 0621  WBC 9.7 11.7* 10.9* 10.9* 15.0*  NEUTROABS 8.5* 10.7* 9.7* 9.5* 12.9*  HGB 12.7* 12.1* 11.9* 11.9* 13.0  HCT 36.1* 34.4* 33.8* 34.5* 38.7*  MCV 84.3 85.1 84.3 86.5 88.8  PLT 291 305 266 310 037   Basic Metabolic Panel: Recent Labs  Lab 01/19/20 0429 01/19/20 0429 01/20/20 0488 01/21/20 0703 01/22/20 0626 01/23/20 0548 01/24/20 0621  NA 133*   < > 139 138 137 139 138  K 3.5   < > 4.3 4.1 4.4 4.4 4.9  CL 102   < > 105 104 102 102 101  CO2 21*   < > 26 27 27 29 28   GLUCOSE 122*   < > 162* 124* 130* 129* 119*  BUN 15   < > 20 26* 26* 25* 28*  CREATININE 0.71   < > 0.81 0.76 0.78 0.66 0.85  CALCIUM 7.9*   < > 8.6* 8.3* 8.3* 8.5* 8.7*  MG 2.0   < > 2.5* 2.3 2.1 2.3 2.2  PHOS 3.0  --  3.6 3.6 3.8 3.9  --    < > = values in this interval not displayed.   GFR: Estimated Creatinine Clearance: 113 mL/min (by C-G formula based on SCr of 0.85 mg/dL). Liver Function Tests: Recent Labs  Lab 01/19/20 0429 01/20/20 8916 01/21/20 0703 01/22/20 0626 01/23/20 0548  AST 78* 75* 139* 92* 67*  ALT 58* 67* 149* 135* 122*  ALKPHOS 90 84 78 72 69  BILITOT 1.3* 0.7 0.7 0.8 0.9  PROT 6.0* 6.0* 5.8* 5.5* 5.8*  ALBUMIN 2.5* 2.5* 2.4* 2.4* 2.6*   No results for input(s): LIPASE, AMYLASE in the last 168 hours. No results for input(s): AMMONIA in the last 168 hours. Coagulation Profile: Recent Labs  Lab 01/18/20 2138  INR 1.1   Cardiac Enzymes: No results for input(s): CKTOTAL, CKMB, CKMBINDEX, TROPONINI in the last 168 hours. BNP (last 3 results) No results for input(s): PROBNP in the last 8760 hours. HbA1C: No results for input(s): HGBA1C in the last 72 hours. CBG: No results for input(s): GLUCAP in the last 168 hours. Lipid Profile: No results for input(s): CHOL, HDL, LDLCALC, TRIG, CHOLHDL, LDLDIRECT in the last 72 hours. Thyroid Function Tests: No results for  input(s): TSH, T4TOTAL, FREET4, T3FREE, THYROIDAB in the last 72 hours. Anemia Panel: Recent Labs    01/22/20 0626 01/23/20 0548  FERRITIN 362* 356*   Sepsis Labs: Recent Labs  Lab 01/19/20 0429 01/24/20 0621  PROCALCITON 0.23 <0.10    No results found for this or any previous visit (from the past 240 hour(s)).       Radiology Studies: DG Chest 1 View  Result Date: 01/23/2020 CLINICAL DATA:  COVID-19 positivity EXAM: CHEST  1 VIEW COMPARISON:  01/18/2020 FINDINGS: Cardiac shadow is stable. Right upper lobe infiltrate is noted with volume loss. Patchy opacities are noted throughout both lungs consistent with the given clinical history of COVID-19 positivity. Hiatal hernia is noted. No bony abnormality is seen. IMPRESSION: Bilateral patchy opacities consistent with COVID-19 positivity. Electronically Signed   By: Inez Catalina M.D.   On: 01/23/2020 15:20        Scheduled Meds: . apixaban  10 mg Oral BID   Followed by  . [START ON 01/27/2020] apixaban  5 mg Oral BID  . vitamin C  500 mg Oral Daily  . Ipratropium-Albuterol  1 puff Inhalation Q6H  . mouth rinse  15 mL Mouth Rinse BID  . methylPREDNISolone (SOLU-MEDROL) injection  40 mg Intravenous Q12H  . sodium chloride flush  3 mL Intravenous Q12H  . zinc sulfate  220 mg Oral Daily   Continuous Infusions:   LOS: 6 days    Time spent: 26 minutes    Sharen Hones, MD [facility]   To contact the attending provider between 7A-7P or the covering provider during after hours 7P-7A, please log into the web site [email or web address] and access using universal [place] password for that web site. If you do not have the password, please call the hospital operator.  01/24/2020, 9:23 AM

## 2020-01-25 NOTE — Progress Notes (Signed)
PROGRESS NOTE    Geoffrey Wells  [id number] DOB: August 24, 1946 DOA: 01/18/2020 PCP: [facility] [place]   Chief complaint.  Hypoxia. Brief Narrative: Geoffrey Wells a 74 y.o.malewith medical history significant forrecently diagnosed COVID-19 pneumonia who presents to the ED for worsening shortness of breath. Patient had a positive COVID-19 test on 01/10/2020 and was admitted at [facility] hospital from 01/10/2020-01/12/2020 for acute respiratory failure with hypoxemia due to COVID-19 pneumonia.  He was started on treatment with IV remdesivir and IV Decadron. He was discharged on 01/12/2020 on 2 L supplemental O2 via [place] and completed 5 days total outpatient IV remdesivir treatment at the Covid infusion clinic in [place]. Does not appear he was discharged with steroids.  Patient came back to the hospital again with increased short of breath, hypoxemia, currently he is on 5 L oxygen. Chest CT scan showed bilateral PE, bilateral lungs groundglass consolidation, worse on the right side. He is placed on heparin drip, restarted IV steroids.Anticoagulation since has changed to Eliquis.  5/23.Patient was put back on 5 L oxygen last night after initial improvement to 2 L.  Chest x-ray consistent with Covid pneumonia, pro BNP not significantly elevated.  Initially covered for secondary bacterial infection, discontinued after procalcitonin level dropped down to less than 0.01.   Assessment & Plan:   Principal Problem:   Acute hypoxemic respiratory failure due to COVID-19 [facility]) Active Problems:   Bilateral pulmonary embolism ([place])  #1.  Acute hypoxemic respiratory failure secondary to COVID-19 pneumonia. Patient still requiring 4 L oxygen, no evidence of additional secondary bacterial infection, proBNP was not elevated.  Bilateral PE is treated.  We will continue steroids.  Continue incentive spirometer and EZ Pap.  #2.  Acute bilateral PE secondary to Covid infection. Treated with  Eliquis.  3.  Covid viral pneumonia. Initially on IV antibiotics due to mild elevation of procalcitonin level.  Antibiotic discontinued as procalcitonin level dropped down to less than 0.01.  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   DVT prophylaxis:Eliquis Code Status:Full Family Communication:None Disposition Plan:  Patient came from:Home  Anticipated d/c place:Home  Barriers to d/c OR conditions which need to be met to effect a safe d/c: Will need home careafterdischarge  Consultants:  None  Procedures:None Antimicrobials:None   Subjective: Patient is still on 4 L oxygen, oxygen saturation seem to be improving.  He does not feel any short of breath.  No chest pain.  He was able to walk to the bathroom with physical therapy.  Did not desaturate with exertion.  He appeared to be more stable when he was walking. No abdominal pain or nausea vomiting.  No diarrhea. No fever or chills.  Objective: Vitals:   01/24/20 0810 01/24/20 1711 01/25/20 0032 01/25/20 0815  BP: 116/90 (!) 152/81 111/71 124/73  Pulse: 83 94 73 67  Resp: 16 13 15  (!) 25  Temp: 98.5 F (36.9 C) 98.5 F (36.9 C) 97.7 F (36.5 C) 97.8 F (36.6 C)  TempSrc: Oral Oral Oral Oral  SpO2: 94% 94% 98% 98%  Weight:      Height:        Intake/Output Summary (Last 24 hours) at 01/25/2020 1112 Last data filed at 01/25/2020 0222 Gross per 24 hour  Intake --  Output 1900 ml  Net -1900 ml   Filed Weights   01/18/20 2127 01/19/20 0758  Weight: 134.3 kg 134.3 kg    Examination:  General exam: Appears calm and comfortable  Respiratory system: Clear to auscultation. Respiratory effort normal.  Cardiovascular system: S1 & S2 heard, RRR. No JVD, murmurs, rubs, gallops or clicks. No pedal edema. Gastrointestinal system: Abdomen is nondistended, soft and nontender. No  organomegaly or masses felt. Normal bowel sounds heard. Central nervous system: Alert and oriented. No focal neurological deficits. Extremities: Symmetric 5 x 5 Skin: No rashes, lesions or ulcers Psychiatry: Judgement and insight appear normal. Mood & affect appropriate.     Data Reviewed: I have personally reviewed following labs and imaging studies  CBC: Recent Labs  Lab 01/20/20 0637 01/21/20 0703 01/22/20 0626 01/23/20 0548 01/24/20 0621  WBC 9.7 11.7* 10.9* 10.9* 15.0*  NEUTROABS 8.5* 10.7* 9.7* 9.5* 12.9*  HGB 12.7* 12.1* 11.9* 11.9* 13.0  HCT 36.1* 34.4* 33.8* 34.5* 38.7*  MCV 84.3 85.1 84.3 86.5 88.8  PLT 291 305 266 310 092   Basic Metabolic Panel: Recent Labs  Lab 01/19/20 0429 01/19/20 0429 01/20/20 9574 01/21/20 0703 01/22/20 0626 01/23/20 0548 01/24/20 0621  NA 133*   < > 139 138 137 139 138  K 3.5   < > 4.3 4.1 4.4 4.4 4.9  CL 102   < > 105 104 102 102 101  CO2 21*   < > 26 27 27 29 28   GLUCOSE 122*   < > 162* 124* 130* 129* 119*  BUN 15   < > 20 26* 26* 25* 28*  CREATININE 0.71   < > 0.81 0.76 0.78 0.66 0.85  CALCIUM 7.9*   < > 8.6* 8.3* 8.3* 8.5* 8.7*  MG 2.0   < > 2.5* 2.3 2.1 2.3 2.2  PHOS 3.0  --  3.6 3.6 3.8 3.9  --    < > = values in this interval not displayed.   GFR: Estimated Creatinine Clearance: 113 mL/min (by C-G formula based on SCr of 0.85 mg/dL). Liver Function Tests: Recent Labs  Lab 01/19/20 0429 01/20/20 7340 01/21/20 0703 01/22/20 0626 01/23/20 0548  AST 78* 75* 139* 92* 67*  ALT 58* 67* 149* 135* 122*  ALKPHOS 90 84 78 72 69  BILITOT 1.3* 0.7 0.7 0.8 0.9  PROT 6.0* 6.0* 5.8* 5.5* 5.8*  ALBUMIN 2.5* 2.5* 2.4* 2.4* 2.6*   No results for input(s): LIPASE, AMYLASE in the last 168 hours. No results for input(s): AMMONIA in the last 168 hours. Coagulation Profile: Recent Labs  Lab 01/18/20 2138  INR 1.1   Cardiac Enzymes: No results for input(s): CKTOTAL, CKMB, CKMBINDEX, TROPONINI in the last 168 hours. BNP (last  3 results) No results for input(s): PROBNP in the last 8760 hours. HbA1C: No results for input(s): HGBA1C in the last 72 hours. CBG: No results for input(s): GLUCAP in the last 168 hours. Lipid Profile: No results for input(s): CHOL, HDL, LDLCALC, TRIG, CHOLHDL, LDLDIRECT in the last 72 hours. Thyroid Function Tests: No results for input(s): TSH, T4TOTAL, FREET4, T3FREE, THYROIDAB in the last 72 hours. Anemia Panel: Recent Labs    01/23/20 0548  FERRITIN 356*   Sepsis Labs: Recent Labs  Lab 01/19/20 0429 01/24/20 0621  PROCALCITON 0.23 <0.10    No results found for this or any previous visit (from the past 240 hour(s)).       Radiology Studies: DG Chest 1 View  Result Date: 01/23/2020 CLINICAL DATA:  COVID-19 positivity EXAM: CHEST  1 VIEW COMPARISON:  01/18/2020 FINDINGS: Cardiac shadow is stable. Right upper lobe infiltrate is noted with volume loss. Patchy opacities are noted throughout both lungs consistent with the given clinical history of COVID-19 positivity. Hiatal hernia is noted.  No bony abnormality is seen. IMPRESSION: Bilateral patchy opacities consistent with COVID-19 positivity. Electronically Signed   By: Inez Catalina M.D.   On: 01/23/2020 15:20        Scheduled Meds: . apixaban  10 mg Oral BID   Followed by  . [START ON 01/27/2020] apixaban  5 mg Oral BID  . vitamin C  500 mg Oral Daily  . Ipratropium-Albuterol  1 puff Inhalation Q6H  . mouth rinse  15 mL Mouth Rinse BID  . methylPREDNISolone (SOLU-MEDROL) injection  40 mg Intravenous Q12H  . sodium chloride flush  3 mL Intravenous Q12H  . zinc sulfate  220 mg Oral Daily   Continuous Infusions:   LOS: 7 days    Time spent: 25 minutes    Sharen Hones, MD [facility]   To contact the attending provider between 7A-7P or the covering provider during after hours 7P-7A, please log into the web site [email or web address] and access using universal [place] password for that web site. If you do  not have the password, please call the hospital operator.  01/25/2020, 11:12 AM

## 2020-01-25 NOTE — TOC Initial Note (Addendum)
Transition of Care [facility]) - Initial/Assessment Note    Patient Details  Name: Geoffrey Wells MRN: [id number] Date of Birth: Jul 25, 1946  [facility] of [facility]) CM/SW Contact:    Shelbie Hutching, RN Phone Number: 01/25/2020, 12:03 PM  Clinical Narrative:                 Patient admitted for COVID 19, patient was recently discharged 5/12 for same.  At last discharge patient was set up with oxygen 3L [place] from the [place].  Patient was also provided with a walker from the [place] at that time.  Patient is from home where he lives with his wife and is independent at baseline.  Patient's wife also has COVID at this time.  Patient's wife reports that she cannot take care of him right now because she is also sick.  Patient is not medically clear for discharge due to oxygen requirements.  RNCM will cont to follow.  Home Health will be arranged with [facility] at discharge.  Patient gets his primary [facility] services at the [facility] with [place].   Expected Discharge Plan: [place] Barriers to Discharge: Continued Medical Work up   Patient Goals and CMS Choice Patient states their goals for this hospitalization and ongoing recovery are:: Patient would like to go home CMS Medicare.gov Compare Post Acute Care list provided to:: Patient Choice offered to / list presented to : Patient  Expected Discharge Plan and Services Expected Discharge Plan: [place]   Discharge Planning Services: CM Consult Post Acute Care Choice: [place] arrangements for the past 2 months: Single Family Home                           HH Arranged: RN, PT, OT, Nurse's Aide [place] Agency: [facility] ([place]) Date HH Agency Contacted: 01/25/20 Time [place]: 1202 Representative spoke with at [place]: Floydene Flock  Prior Living Arrangements/Services Living arrangements for the past 2 months: [place] with::  Spouse Patient language and need for interpreter reviewed:: Yes Do you feel safe going back to the place where you live?: Yes      Need for Family Participation in Patient Care: Yes (Comment)(COVID) Care giver support system in place?: Yes (comment)(wife, son) Current home services: DME(oxygen and walker arranged last admission) Criminal Activity/Legal Involvement Pertinent to Current Situation/Hospitalization: No - Comment as needed  Activities of Daily Living Home Assistive Devices/Equipment: None ADL Screening (condition at time of admission) Patient's cognitive ability adequate to safely complete daily activities?: Yes Is the patient deaf or have difficulty hearing?: Yes Does the patient have difficulty seeing, even when wearing glasses/contacts?: Yes Does the patient have difficulty concentrating, remembering, or making decisions?: Yes Patient able to express need for assistance with ADLs?: Yes Does the patient have difficulty dressing or bathing?: No Independently performs ADLs?: Yes (appropriate for developmental age) Does the patient have difficulty walking or climbing stairs?: Yes Weakness of Legs: Both Weakness of Arms/Hands: None  Permission Sought/Granted Permission sought to share information with : Case Manager, Family Supports, Other (comment) Permission granted to share information with : Yes, Verbal Permission Granted     Permission granted to share info w AGENCY: [place] granted to share info w Relationship: wife     Emotional Assessment       Orientation: : Oriented to Self, Oriented to  Place, Oriented to  Time, Oriented to Situation Alcohol / Substance Use: Not Applicable Psych Involvement: No (comment)  Admission diagnosis:  Hypoxia [R09.02] Respiratory failure with hypoxia, unspecified chronicity (HCC) [J96.91] Other acute pulmonary embolism, unspecified whether acute cor pulmonale present (HCC) [I26.99] Acute hypoxemic  respiratory failure due to COVID-19 (HCC) [U07.1, J96.01] Pneumonia due to COVID-19 virus [U07.1, J12.82] Patient Active Problem List   Diagnosis Date Noted  . Bilateral pulmonary embolism ([place]) 01/19/2020  . Acute hypoxemic respiratory failure due to COVID-19 ([place]) 01/18/2020  . Generalized weakness   . COVID-19 01/10/2020   PCP:  [facility] [place]:   [id number] - , [place] - [address] [place] [address] Phone: [phone] Fax: [phone]     Social Determinants of Health (SDOH) Interventions    Readmission Risk Interventions No flowsheet data found.

## 2020-01-26 LAB — CBC
HCT: 38.6 % — ABNORMAL LOW (ref 39.0–52.0)
Hemoglobin: 12.9 g/dL — ABNORMAL LOW (ref 13.0–17.0)
MCH: 29.9 pg (ref 26.0–34.0)
MCHC: 33.4 g/dL (ref 30.0–36.0)
MCV: 89.6 fL (ref 80.0–100.0)
Platelets: 287 10*3/uL (ref 150–400)
RBC: 4.31 MIL/uL (ref 4.22–5.81)
RDW: 15.2 % (ref 11.5–15.5)
WBC: 10.3 10*3/uL (ref 4.0–10.5)
nRBC: 0 % (ref 0.0–0.2)

## 2020-01-26 LAB — BASIC METABOLIC PANEL
Anion gap: 11 (ref 5–15)
BUN: 29 mg/dL — ABNORMAL HIGH (ref 8–23)
CO2: 28 mmol/L (ref 22–32)
Calcium: 8.7 mg/dL — ABNORMAL LOW (ref 8.9–10.3)
Chloride: 95 mmol/L — ABNORMAL LOW (ref 98–111)
Creatinine, Ser: 0.89 mg/dL (ref 0.61–1.24)
GFR calc Af Amer: >60 mL/min (ref >60)
GFR calc non Af Amer: >60 mL/min (ref >60)
Glucose, Bld: 114 mg/dL — ABNORMAL HIGH (ref 70–99)
Potassium: 4.2 mmol/L (ref 3.5–5.1)
Sodium: 134 mmol/L — ABNORMAL LOW (ref 135–145)

## 2020-01-26 MED ORDER — FUROSEMIDE 10 MG/ML IJ SOLN
40.0000 mg | Freq: Every day | INTRAMUSCULAR | Status: DC
  Administered 2020-01-26 – 2020-01-27 (×2): 40 mg via INTRAVENOUS
  Filled 2020-01-26 (×2): qty 4

## 2020-01-26 NOTE — Progress Notes (Addendum)
Physical Therapy Treatment Patient Details Name: Geoffrey Wells MRN: [id number] DOB: 08/04/1946 Today's Date: 01/26/2020    History of Present Illness presented to ER secondary to worsening cough, SOB, increased WOB; admitted for management of acute hypoxemic respiratory failure related to COVID-19 PNA, bilat PE (anticoagulation initiated 5/19 0020).    PT Comments    Pt 2.5 lpm with sats high 90's at rest.  Reports feeling better ind mobility in room to bathroom.  OOB without assist and is able to walk 50' without AD today and generally steady gait.  Occasional imbalances but overall improved since Monday.  Seated rest with sats in low 80's.  Fairly quick recovery on 2.5 LPM.  He is able to walk 40' with increased recovery time and increased to 4 lpm.  After seated rest and return to baseline, he is able to stand and complete standing ex's with light tactile support on walker for balance.  Seated rest.  Pt feeling good with sats back to high 90's and wanting to walk again before eating lunch tray that arrived.  Stood and was able to walk to door and back and sats decreased to 77%.  RN entered room at the time for medications.  Voiced concern over O2 sats and RN  increased to 15 lpm.  Pt left to rest with care of RN.  Pt resting comfortably at edge of bed with no distress noted.  While overall mobility and balance is improving, pt continues to desaturate with mobility.  Short bursts of activity he is able to recover well but as time of activity increases his ability to recover with some increased O2 support also increase.  If pt is discharge home in the next few days, he may benefit from a wheelchair to allow for improved household mobility and MD appt's and maintain sats as his tolerance for prolonged activity remains decreased.   Patient suffers from Covid 19 which impairs his/her ability to perform daily activities like toileting, feeding, dressing, grooming, bathing in the home. A cane, walker, crutch  will not resolve the patient's issue with performing activities of daily living. A lightweight wheelchair and cushion is required/recommended and will allow patient to safely perform daily activities.   Patient can safely propel the wheelchair in the home or has a caregiver who can provide assistance.     Follow Up Recommendations  Home health PT     Equipment Recommendations  Rolling walker with 5" wheels , wheelchair.   Recommendations for Other Services       Precautions / Restrictions Precautions Precautions: Fall Restrictions Weight Bearing Restrictions: No    Mobility  Bed Mobility Overal bed mobility: Modified Independent                Transfers Overall transfer level: Modified independent                  Ambulation/Gait Ambulation/Gait assistance: Modified independent (Device/Increase time) Gait Distance (Feet): 50 Feet Assistive device: Rolling walker (2 wheeled) Gait Pattern/deviations: Step-through pattern;Decreased step length - right;Decreased step length - left;Narrow base of support Gait velocity: decreased   General Gait Details: occasional LOB but able to self correct, continues to desat wiht mobility which increased with activity   Stairs             Wheelchair Mobility    Modified Rankin (Stroke Patients Only)       Balance Overall balance assessment: Needs assistance Sitting-balance support: No upper extremity supported;Feet supported Sitting balance-Leahy Scale: Good  Standing balance support: No upper extremity supported Standing balance-Leahy Scale: Fair Standing balance comment: overall improved this session without walker and good confidence                            Cognition Arousal/Alertness: Awake/alert Behavior During Therapy: WFL for tasks assessed/performed Overall Cognitive Status: Within Functional Limits for tasks assessed                                         Exercises Other Exercises Other Exercises: standing ex with RW and min gaurd.  - heel raises, SLR, marches, x 10, minisquats x 10    General Comments        Pertinent Vitals/Pain Pain Assessment: No/denies pain    Home Living                      Prior Function            PT Goals (current goals can now be found in the care plan section) Progress towards PT goals: Progressing toward goals    Frequency    Min 2X/week      PT Plan Current plan remains appropriate    Co-evaluation              AM-PAC PT "6 Clicks" Mobility   Outcome Measure  Help needed turning from your back to your side while in a flat bed without using bedrails?: None Help needed moving from lying on your back to sitting on the side of a flat bed without using bedrails?: None Help needed moving to and from a bed to a chair (including a wheelchair)?: None Help needed standing up from a chair using your arms (e.g., wheelchair or bedside chair)?: None Help needed to walk in hospital room?: A Little Help needed climbing 3-5 steps with a railing? : A Little 6 Click Score: 22    End of Session Equipment Utilized During Treatment: Oxygen;Gait belt Activity Tolerance: Patient tolerated treatment well Patient left: in bed;with call bell/phone within reach;with nursing/sitter in room Nurse Communication: Other (comment)       Time: [id number] PT Time Calculation (min) (ACUTE ONLY): 30 min  Charges:  $Gait Training: 8-22 mins $Therapeutic Exercise: 8-22 mins                    Chesley Noon, PTA 01/26/20, 1:36 PM

## 2020-01-26 NOTE — Progress Notes (Signed)
Upon entering room to administer afternoon medications, physical therapy in room assisting with pt. Pt. Standing up with o2 sats at 77%. At this time oxygen had not been turned up. RN to turn up o2 to 15L.Marland Kitchen o2 sat increased to 83%.  After about 10 minutes, o2 is back down to 3L and o2sats 96%.  Pt. Denies any pain or discomfort at this time - sitting up to eat lunch. Will continue to monitor.

## 2020-01-26 NOTE — Consult Note (Signed)
Pulmonary Medicine          Date: 01/26/2020,   MRN# [id number] Geoffrey Wells [birth year]     AdmissionWeight: 134.3 kg                 CurrentWeight: 134.3 kg   Referring physician: Dr Kurtis Bushman   CHIEF COMPLAINT:   Acute bilateral PE in context of COVID19 pneumonia   HISTORY OF PRESENT ILLNESS   Geoffrey Wells a 74 y.o.malewith medical history significant forrecently diagnosed COVID-19 pneumonia who presents to the ED for worsening shortness of breath. Patient had a positive COVID-19 test on 01/10/2020 and was admitted at [facility] hospital from 01/10/2020-01/12/2020 for acute respiratory failure with hypoxemia due to COVID-19 pneumonia.  He was started on treatment with IV remdesivir and IV Decadron. He was discharged on 01/12/2020 on 2 L supplemental O2 via [place] and completed 5 days total outpatient IV remdesivir treatment at the Covid infusion clinic in [place]. Does not appear he was discharged with steroids.  Patient came back to the hospital again with increased short of breath, hypoxemia, currently he is on 5 L oxygen. Chest CT scan showed bilateral PE, bilateral lungs groundglass consolidation, worse on the right side. He is placed on heparin drip, restarted IV steroids.Anticoagulation since has changed to Eliquis.   PAST MEDICAL HISTORY   Past Medical History:  Diagnosis Date  . Pneumonia due to COVID-19 virus 01/10/2020     SURGICAL HISTORY   Past Surgical History:  Procedure Laterality Date  . HERNIA REPAIR       FAMILY HISTORY   History reviewed. No pertinent family history.   SOCIAL HISTORY   Social History   Tobacco Use  . Smoking status: Former Smoker    Packs/day: 0.50    Years: 25.00    Pack years: 12.50    Types: Cigarettes  . Smokeless tobacco: Never Used  Substance Use Topics  . Alcohol use: Yes    Alcohol/week: 7.0 standard drinks    Types: 7 Cans of beer per week  . Drug use: Not Currently     MEDICATIONS    Home  Medication:  Current Outpatient Rx  . Order #: [id number] Class: Print  . Order #: [id number] Class: Print    Current Medication:  Current Facility-Administered Medications:  .  acetaminophen (TYLENOL) tablet 650 mg, 650 mg, Oral, Q6H PRN, Lenore Cordia, MD .  apixaban (ELIQUIS) tablet 10 mg, 10 mg, Oral, BID, 10 mg at 01/26/20 0919 **FOLLOWED BY** [START ON 01/27/2020] apixaban (ELIQUIS) tablet 5 mg, 5 mg, Oral, BID, Sharen Hones, MD .  ascorbic acid (VITAMIN C) tablet 500 mg, 500 mg, Oral, Daily, Zada Finders R, MD, 500 mg at 01/26/20 0919 .  chlorpheniramine-HYDROcodone (TUSSIONEX) 10-8 MG/5ML suspension 5 mL, 5 mL, Oral, Q12H PRN, Patel, Vishal R, MD .  guaiFENesin-dextromethorphan (ROBITUSSIN DM) 100-10 MG/5ML syrup 10 mL, 10 mL, Oral, Q4H PRN, Posey Pronto, Vishal R, MD .  Ipratropium-Albuterol (COMBIVENT) respimat 1 puff, 1 puff, Inhalation, Q6H, Lenore Cordia, MD, 1 puff at 01/26/20 1319 .  MEDLINE mouth rinse, 15 mL, Mouth Rinse, BID, Sharion Settler, NP, 15 mL at 01/26/20 0920 .  methylPREDNISolone sodium succinate (SOLU-MEDROL) 40 mg/mL injection 40 mg, 40 mg, Intravenous, Q12H, Zada Finders R, MD, 40 mg at 01/26/20 1315 .  ondansetron (ZOFRAN) tablet 4 mg, 4 mg, Oral, Q6H PRN **OR** ondansetron (ZOFRAN) injection 4 mg, 4 mg, Intravenous, Q6H PRN, Posey Pronto, Vishal R, MD .  senna-docusate (Senokot-S) tablet 1 tablet, 1 tablet, Oral,  QHS PRN, Lenore Cordia, MD, 1 tablet at 01/19/20 2338 .  sodium chloride flush (NS) 0.9 % injection 3 mL, 3 mL, Intravenous, Q12H, Zada Finders R, MD, 3 mL at 01/26/20 0920 .  zinc sulfate capsule 220 mg, 220 mg, Oral, Daily, Zada Finders R, MD, 220 mg at 01/26/20 [id number]    ALLERGIES   Patient has no known allergies.     REVIEW OF SYSTEMS    Review of Systems:  Gen:  Denies  fever, sweats, chills weigh loss  HEENT: Denies blurred vision, double vision, ear pain, eye pain, hearing loss, nose bleeds, sore throat Cardiac:  No dizziness, chest pain or  heaviness, chest tightness,edema Resp:   Denies cough or sputum porduction, shortness of breath,wheezing, hemoptysis,  Gi: Denies swallowing difficulty, stomach pain, nausea or vomiting, diarrhea, constipation, bowel incontinence Gu:  Denies bladder incontinence, burning urine Ext:   Denies Joint pain, stiffness or swelling Skin: Denies  skin rash, easy bruising or bleeding or hives Endoc:  Denies polyuria, polydipsia , polyphagia or weight change Psych:   Denies depression, insomnia or hallucinations   Other:  All other systems negative   VS: BP 137/85 (BP Location: Right Arm)   Pulse 84   Temp 98.1 F (36.7 C) (Oral)   Resp 19   Ht 6\' 1"  (1.854 m)   Wt 134.3 kg   SpO2 95%   BMI 39.05 kg/m      PHYSICAL EXAM    GENERAL:NAD, no fevers, chills, no weakness no fatigue HEAD: Normocephalic, atraumatic.  EYES: Pupils equal, round, reactive to light. Extraocular muscles intact. No scleral icterus.  MOUTH: Moist mucosal membrane. Dentition intact. No abscess noted.  EAR, NOSE, THROAT: Clear without exudates. No external lesions.  NECK: Supple. No thyromegaly. No nodules. No JVD.  PULMONARY: Diffuse coarse rhonchi right sided +wheezes CARDIOVASCULAR: S1 and S2. Regular rate and rhythm. No murmurs, rubs, or gallops. No edema. Pedal pulses 2+ bilaterally.  GASTROINTESTINAL: Soft, nontender, nondistended. No masses. Positive bowel sounds. No hepatosplenomegaly.  MUSCULOSKELETAL: No swelling, clubbing, or edema. Range of motion full in all extremities.  NEUROLOGIC: Cranial nerves II through XII are intact. No gross focal neurological deficits. Sensation intact. Reflexes intact.  SKIN: No ulceration, lesions, rashes, or cyanosis. Skin warm and dry. Turgor intact.  PSYCHIATRIC: Mood, affect within normal limits. The patient is awake, alert and oriented x 3. Insight, judgment intact.       IMAGING    DG Chest 1 View  Result Date: 01/23/2020 CLINICAL DATA:  COVID-19 positivity EXAM:  CHEST  1 VIEW COMPARISON:  01/18/2020 FINDINGS: Cardiac shadow is stable. Right upper lobe infiltrate is noted with volume loss. Patchy opacities are noted throughout both lungs consistent with the given clinical history of COVID-19 positivity. Hiatal hernia is noted. No bony abnormality is seen. IMPRESSION: Bilateral patchy opacities consistent with COVID-19 positivity. Electronically Signed   By: Inez Catalina M.D.   On: 01/23/2020 15:20   CT Angio Chest PE W and/or Wo Contrast  Result Date: 01/18/2020 CLINICAL DATA:  Shortness of breath. EXAM: CT ANGIOGRAPHY CHEST WITH CONTRAST TECHNIQUE: Multidetector CT imaging of the chest was performed using the standard protocol during bolus administration of intravenous contrast. Multiplanar CT image reconstructions and MIPs were obtained to evaluate the vascular anatomy. CONTRAST:  168mL OMNIPAQUE IOHEXOL 350 MG/ML SOLN COMPARISON:  Jan 10, 2020 FINDINGS: Cardiovascular: There is moderate severity calcification of the thoracic aorta. Mild areas of intraluminal low attenuation are seen involving multiple upper lobe and lower lobe  branches of the left pulmonary artery. Normal heart size. No pericardial effusion. Moderate severity coronary artery calcification is seen. Mediastinum/Nodes: No enlarged mediastinal, hilar, or axillary lymph nodes. Thyroid gland, trachea, and esophagus demonstrate no significant findings. Lungs/Pleura: Marked severity diffuse patchy and ground-glass appearing infiltrates which is increased in severity when compared to the prior study. There is no evidence of a pleural effusion or pneumothorax. Upper Abdomen: There is a large gastric hernia. A subcentimeter gallstone is seen within the gallbladder lumen. Musculoskeletal: Multilevel degenerative changes seen throughout the thoracic spine. Review of the MIP images confirms the above findings. IMPRESSION: 1. Marked severity diffuse bilateral patchy and ground-glass appearing infiltrates. 2.  Findings consistent with mild, bilateral upper lobe and lower lobe pulmonary emboli. 3. Large gastric hernia. 4. Cholelithiasis. Aortic Atherosclerosis (ICD10-I70.0). Electronically Signed   By: Virgina Norfolk M.D.   On: 01/18/2020 23:53   CT Angio Chest PE W and/or Wo Contrast  Result Date: 01/10/2020 CLINICAL DATA:  Hypoxemia EXAM: CT ANGIOGRAPHY CHEST WITH CONTRAST TECHNIQUE: Multidetector CT imaging of the chest was performed using the standard protocol during bolus administration of intravenous contrast. Multiplanar CT image reconstructions and MIPs were obtained to evaluate the vascular anatomy. CONTRAST:  17mL OMNIPAQUE IOHEXOL 350 MG/ML SOLN COMPARISON:  None. FINDINGS: Cardiovascular: There is a optimal opacification of the pulmonary arteries. There is no central,segmental, or subsegmental filling defects within the pulmonary arteries. There is mild cardiomegaly. No pericardial effusion or thickening. No evidence right heart strain. There is normal three-vessel brachiocephalic anatomy. The ascending intrathoracic aorta appears to be dilated measuring up to 4.3 cm which tapers at the level of the aortic arch, however contrast is preferentially within the pulmonary arteries. Scattered aortic atherosclerosis is seen. There is also atherosclerosis seen at the origin of the great vessels. Mediastinum/Nodes: No hilar, mediastinal, or axillary adenopathy. Thyroid gland, trachea, and esophagus demonstrate no significant findings. Lungs/Pleura: Ground-glass hazy airspace opacities seen throughout the right lung with patchy airspace opacities seen posteriorly at the right lung base. There is also patchy airspace opacity seen at the posterior left lung base. No pleural effusion is seen. Upper Abdomen: No acute abnormalities present in the visualized portions of the upper abdomen. Musculoskeletal: No chest wall abnormality. No acute or significant osseous findings. Review of the MIP images confirms the above  findings. IMPRESSION: 1. No central, segmental, or subsegmental pulmonary embolism. 2. Multifocal patchy ground-glass hazy airspace opacities seen throughout both lungs right greater than left, likely consistent with multifocal pneumonia. 3. 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 Recommend annual imaging followup by CTA or MRA. This recommendation follows 2010 ACCF/AHA/AATS/ACR/ASA/SCA/SCAI/SIR/STS/SVM Guidelines for the Diagnosis and Management of Patients with Thoracic Aortic Disease. Circulation. 2010; [id number]. Aortic aneurysm NOS (ICD10-I71.9) 4.  Aortic Atherosclerosis (ICD10-I70.0). Electronically Signed   By: Prudencio Pair M.D.   On: 01/10/2020 23:00   DG Chest Portable 1 View  Result Date: 01/18/2020 CLINICAL DATA:  Worsening shortness of breath, COVID positive. EXAM: PORTABLE CHEST 1 VIEW COMPARISON:  None. FINDINGS: Moderate to marked severity infiltrate is seen within the right upper lobe with mild infiltrates seen along the periphery of the left lung and right lung base. There is no evidence of a pleural effusion or pneumothorax. The heart size and mediastinal contours are within normal limits. A small hiatal hernia is seen. The visualized skeletal structures are unremarkable. IMPRESSION: 1. Moderate to marked severity right upper lobe infiltrate. 2. Mild bilateral infiltrates. Electronically Signed   By: Joyce Gross.D.  On: 01/18/2020 21:57      ASSESSMENT/PLAN   Acute on chronic hypoxemic respiratory failure  - BNP is mildly elevated at 182-which is very good  -will obtain new HS-troponin to eval for RH strain - no signs of this on CT chest -due to bilateral atypical pneumonia secondary to Varina -continue solumedrol  -s/p remdesevir x5d -Adding lasix and albumin   Bilateral- segmental pulmonary emboli  clot burden is low - not a candidate for thrombectomy -Vital signs are stable on supplemental O2  -continue with eliquis BID PT/OT -ordered    Large  Hiatal hernia   predisposes patient to recurrent regurgitation and aspiration - will evaluate on outpatient in clinic     Will continue to follow with you,   Thank you for allowing me to participate in the care of this patient.    Patient/Family are satisfied with care plan and all questions have been answered.   This document was prepared using Dragon voice recognition software and may include unintentional dictation errors.     Ottie Glazier, M.D.  Division of [facility] [place]

## 2020-01-26 NOTE — Progress Notes (Signed)
PROGRESS NOTE    Geoffrey Wells  [id number] DOB: May 29, 1946 DOA: 01/18/2020 PCP: [facility] [place]    Brief Narrative:  Geoffrey Wells a 74 y.o.malewith medical history significant forrecently diagnosed COVID-19 pneumonia who presents to the ED for worsening shortness of breath. Patient had a positive COVID-19 test on 01/10/2020 and was admitted at [facility] hospital from 01/10/2020-01/12/2020 for acute respiratory failure with hypoxemia due to COVID-19 pneumonia.  He was started on treatment with IV remdesivir and IV Decadron. He was discharged on 01/12/2020 on 2 L supplemental O2 via [place] and completed 5 days total outpatient IV remdesivir treatment at the Covid infusion clinic in [place]. Does not appear he was discharged with steroids.  Patient came back to the hospital again with increased short of breath, hypoxemia, currently he is on 5 L oxygen. Chest CT scan showed bilateral PE, bilateral lungs groundglass consolidation, worse on the right side. He is placed on heparin drip, restarted IV steroids.Anticoagulation since has changed to Eliquis.     Consultants:    Procedures:   Antimicrobials:   Cefepime discontinued   Subjective: Little improvement of sob. Still at times with ambulation requiring o2, dropped to 70's.  Objective: Vitals:   01/25/20 0032 01/25/20 0815 01/25/20 1722 01/26/20 0108  BP: 111/71 124/73 (!) 140/94 97/60  Pulse: 73 67 78 67  Resp: 15 (!) 25 (!) 22 20  Temp: 97.7 F (36.5 C) 97.8 F (36.6 C) 97.9 F (36.6 C) 97.9 F (36.6 C)  TempSrc: Oral Oral Oral Oral  SpO2: 98% 98% 95% 98%  Weight:      Height:        Intake/Output Summary (Last 24 hours) at 01/26/2020 1416 Last data filed at 01/26/2020 0900 Gross per 24 hour  Intake 240 ml  Output 800 ml  Net -560 ml   Filed Weights   01/18/20 2127 01/19/20 0758  Weight: 134.3 kg 134.3 kg    Examination:  General exam: Appears calm and comfortable  Respiratory system: Clear to  auscultation. Respiratory effort normal. Cardiovascular system: S1 & S2 heard, RRR. No JVD, murmurs, rubs, gallops or clicks.  Gastrointestinal system: Abdomen is nondistended, soft and nontender. Normal bowel sounds heard. Central nervous system: Grossly intact, alert oriented Extremities: No edema Skin: Warm dry Psychiatry:  Mood & affect appropriate in current setting.     Data Reviewed: I have personally reviewed following labs and imaging studies  CBC: Recent Labs  Lab 01/20/20 0637 01/20/20 [id number] 01/21/20 0703 01/22/20 0626 01/23/20 0548 01/24/20 0621 01/26/20 1001  WBC 9.7   < > 11.7* 10.9* 10.9* 15.0* 10.3  NEUTROABS 8.5*  --  10.7* 9.7* 9.5* 12.9*  --   HGB 12.7*   < > 12.1* 11.9* 11.9* 13.0 12.9*  HCT 36.1*   < > 34.4* 33.8* 34.5* 38.7* 38.6*  MCV 84.3   < > 85.1 84.3 86.5 88.8 89.6  PLT 291   < > 305 266 310 311 287   < > = values in this interval not displayed.   Basic Metabolic Panel: Recent Labs  Lab 01/20/20 0637 01/20/20 [id number] 01/21/20 0703 01/22/20 0626 01/23/20 0548 01/24/20 0621 01/26/20 1001  NA 139   < > 138 137 139 138 134*  K 4.3   < > 4.1 4.4 4.4 4.9 4.2  CL 105   < > 104 102 102 101 95*  CO2 26   < > 27 27 29 28 28   GLUCOSE 162*   < > 124* 130* 129* 119* 114*  BUN 20   < > 26* 26* 25* 28* 29*  CREATININE 0.81   < > 0.76 0.78 0.66 0.85 0.89  CALCIUM 8.6*   < > 8.3* 8.3* 8.5* 8.7* 8.7*  MG 2.5*  --  2.3 2.1 2.3 2.2  --   PHOS 3.6  --  3.6 3.8 3.9  --   --    < > = values in this interval not displayed.   GFR: Estimated Creatinine Clearance: 107.9 mL/min (by C-G formula based on SCr of 0.89 mg/dL). Liver Function Tests: Recent Labs  Lab 01/20/20 [id number] 01/21/20 0703 01/22/20 0626 01/23/20 0548  AST 75* 139* 92* 67*  ALT 67* 149* 135* 122*  ALKPHOS 84 78 72 69  BILITOT 0.7 0.7 0.8 0.9  PROT 6.0* 5.8* 5.5* 5.8*  ALBUMIN 2.5* 2.4* 2.4* 2.6*   No results for input(s): LIPASE, AMYLASE in the last 168 hours. No results for input(s):  AMMONIA in the last 168 hours. Coagulation Profile: No results for input(s): INR, PROTIME in the last 168 hours. Cardiac Enzymes: No results for input(s): CKTOTAL, CKMB, CKMBINDEX, TROPONINI in the last 168 hours. BNP (last 3 results) No results for input(s): PROBNP in the last 8760 hours. HbA1C: No results for input(s): HGBA1C in the last 72 hours. CBG: No results for input(s): GLUCAP in the last 168 hours. Lipid Profile: No results for input(s): CHOL, HDL, LDLCALC, TRIG, CHOLHDL, LDLDIRECT in the last 72 hours. Thyroid Function Tests: No results for input(s): TSH, T4TOTAL, FREET4, T3FREE, THYROIDAB in the last 72 hours. Anemia Panel: No results for input(s): VITAMINB12, FOLATE, FERRITIN, TIBC, IRON, RETICCTPCT in the last 72 hours. Sepsis Labs: Recent Labs  Lab 01/24/20 0621  PROCALCITON <0.10    No results found for this or any previous visit (from the past 240 hour(s)).       Radiology Studies: No results found.      Scheduled Meds: . apixaban  10 mg Oral BID   Followed by  . [START ON 01/27/2020] apixaban  5 mg Oral BID  . vitamin C  500 mg Oral Daily  . Ipratropium-Albuterol  1 puff Inhalation Q6H  . mouth rinse  15 mL Mouth Rinse BID  . methylPREDNISolone (SOLU-MEDROL) injection  40 mg Intravenous Q12H  . sodium chloride flush  3 mL Intravenous Q12H  . zinc sulfate  220 mg Oral Daily   Continuous Infusions:  Assessment & Plan:   Principal Problem:   Acute hypoxemic respiratory failure due to COVID-19 [facility]) Active Problems:   Bilateral pulmonary embolism (HCC)   #1.  Acute hypoxemic respiratory failure secondary to Covid pneumonia. Oxygenation slightly better.  Cefepime discontinued. Continue IV steroids as patient still requiring high flow oxygen with ambulation We will consult pulmonology     2.  Acute bilateral PE.  Secondary to Covid infection. Continue Eliquis.  3.  Covid viral pneumonia. Discontinue cefepime as above.  Leukocytosis is  secondary to IV steroids.  4.  Essential hypertension. Continue to follow.   DVT prophylaxis:Eliquis Code Status:Full Family Communication:None Disposition Plan:Back to home with home care Barrier: Patient still requiring high flow oxygen especially on ambulation.  Still requiring IV steroids.  Pulmonology is consulted today.  Likely will DC in couple of days as he is still not stable to be discharged home and has high risk of return       LOS: 8 days   Time spent: 45 minutes with more than 50% on [place], MD [facility] Pager  [phone]  If 7PM-7AM, please contact night-coverage [email or web address] Password [email or web address] 01/26/2020, 2:16 PM

## 2020-01-27 LAB — BASIC METABOLIC PANEL
Anion gap: 11 (ref 5–15)
BUN: 30 mg/dL — ABNORMAL HIGH (ref 8–23)
CO2: 28 mmol/L (ref 22–32)
Calcium: 8.8 mg/dL — ABNORMAL LOW (ref 8.9–10.3)
Chloride: 97 mmol/L — ABNORMAL LOW (ref 98–111)
Creatinine, Ser: 1.02 mg/dL (ref 0.61–1.24)
GFR calc Af Amer: >60 mL/min (ref >60)
GFR calc non Af Amer: >60 mL/min (ref >60)
Glucose, Bld: 137 mg/dL — ABNORMAL HIGH (ref 70–99)
Potassium: 4.4 mmol/L (ref 3.5–5.1)
Sodium: 136 mmol/L (ref 135–145)

## 2020-01-27 NOTE — Evaluation (Signed)
Occupational Therapy Evaluation Patient Details Name: Geoffrey Wells MRN: [id number] DOB: Mar 13, 1946 Today's Date: 01/27/2020    History of Present Illness presented to ER secondary to worsening cough, SOB, increased WOB; admitted for management of acute hypoxemic respiratory failure related to COVID-19 PNA, bilat PE (anticoagulation initiated 5/19 0020).   Clinical Impression   Geoffrey Wells was seen for OT evaluation this date. Geoffrey Wells was independent in all ADL and functional mobility, living in a 1-level home with 3 STE. Geoffrey Wells on has been on 2 L O2 at home since COVID-19 diagnosis. Geoffrey Wells reports becoming easily fatigued or out of breath with minimal exertion. Geoffrey Wells currently requires supervision for safety during functional mobility and heavy ADL management due to current functional impairments (See OT Problem List below). Geoffrey Wells educated in energy conservation strategies including pursed lip breathing, activity pacing, home/routines modifications, work simplification, AE/DME, prioritizing of meaningful occupations, and falls prevention. Handout provided. Geoffrey Wells verbalized understanding and would benefit from additional skilled OT services to maximize recall and carryover of learned techniques and facilitate implementation of learned techniques into daily routines. Upon discharge, recommend [place] services.       Follow Up Recommendations  Home health OT    Equipment Recommendations  3 in 1 bedside commode    Recommendations for Other Services       Precautions / Restrictions Precautions Precautions: Fall Restrictions Weight Bearing Restrictions: No      Mobility Bed Mobility Overal bed mobility: Modified Independent             General bed mobility comments: Comes to sitting at EOB with good safety awareness.  Transfers Overall transfer level: Modified independent Equipment used: 1 person hand held assist;None Transfers: Sit to/from Stand Sit to Stand: Supervision;Min guard               Balance Overall balance assessment: Needs assistance Sitting-balance support: No upper extremity supported;Feet supported Sitting balance-Leahy Scale: Good Sitting balance - Comments: Steady static sitting, reaching within BOS.   Standing balance support: No upper extremity supported Standing balance-Leahy Scale: Good Standing balance comment: Steady static standing w/o UE support.                           ADL either performed or assessed with clinical judgement   ADL Overall ADL's : Needs assistance/impaired                                       General ADL Comments: Geoffrey Wells is functionally limited by decreased activity tolerance and cardiopulmonary status. He generally reequires SUP to MIN A for heavy ADL management. Has been up ad-lib to use bathroom safely. Would benefit from further review of ECS for improved safety and fxl independence upon hospital DC.     Vision Baseline Vision/History: Wears glasses Wears Glasses: At all times Patient Visual Report: No change from baseline       Perception     Praxis      Pertinent Vitals/Pain Pain Assessment: No/denies pain     Hand Dominance Left   Extremity/Trunk Assessment Upper Extremity Assessment Upper Extremity Assessment: Overall WFL for tasks assessed   Lower Extremity Assessment Lower Extremity Assessment: Overall WFL for tasks assessed   Cervical / Trunk Assessment Cervical / Trunk Assessment: Normal   Communication Communication Communication: HOH   Cognition Arousal/Alertness: Awake/alert Behavior During Therapy: WFL for tasks assessed/performed Overall Cognitive  Status: Within Functional Limits for tasks assessed                                 General Comments: Geoffrey Wells A&O x4. Pleasant, cooperative t/o session.   General Comments  Geoffrey Wells vitals monitored t/o session and remain WFLs with Geoffrey Wells on 2L [place]. SpO2 remains >91%. HR reaches low 80's with functional mobility.     Exercises Other Exercises Other Exercises: Geoffrey Wells educated on falls prevention strategies, safe use of AE for functional mobility, IS use, energy conservation strategies including activity pacing, pursed lip breathing, and routines modifications to support safety and functional independence upon hospital DC.   Shoulder Instructions      Home Living Family/patient expects to be discharged to:: Private residence Living Arrangements: Spouse/significant other;Children(adult children & wife) Available Help at Discharge: Family Type of Home: House Home Access: Stairs to enter Technical brewer of Steps: 3 Entrance Stairs-Rails: Right Home Layout: One level     Bathroom Shower/Tub: Tub/shower unit         Home Equipment: Hand held shower head;Cane - single point;Walker - 2 wheels          Prior Functioning/Environment Level of Independence: Independent        Comments: Indep with ADLs, household and community mobilization; intermittent use of SPC.  Denies fall history.  No home O2 at baseline.        OT Problem List: Decreased strength;Decreased coordination;Decreased activity tolerance;Decreased safety awareness;Cardiopulmonary status limiting activity;Decreased knowledge of use of DME or AE      OT Treatment/Interventions: Self-care/ADL training;Therapeutic exercise;Therapeutic activities;Energy conservation;DME and/or AE instruction;Patient/family education    OT Goals(Current goals can be found in the care plan section) Acute Rehab OT Goals Patient Stated Goal: To go home OT Goal Formulation: With patient Time For Goal Achievement: 02/10/20 Potential to Achieve Goals: Good ADL Goals Geoffrey Wells Will Perform Grooming: with modified independence;sitting(With LRAD PRN for improved safety and functional independence upon hospital DC.) Geoffrey Wells Will Perform Upper Body Dressing: with modified independence;sitting Geoffrey Wells Will Perform Lower Body Dressing: with modified independence;sit to/from  stand;with adaptive equipment(With LRAD PRN for improved safety and functional independence upon hospital DC.) Additional ADL Goal #1: Geoffrey Wells will independently verbalize a plan to implement at least 3 learned energy conservation strategies into his daily routines/home environment for improved safety and fxl independence upon hospital DC.  OT Frequency: Min 1X/week   Barriers to D/C: Decreased caregiver support  Wife recovering from COVID-19       Co-evaluation              AM-PAC OT "6 Clicks" Daily Activity     Outcome Measure Help from another person eating meals?: None Help from another person taking care of personal grooming?: None Help from another person toileting, which includes using toliet, bedpan, or urinal?: A Little Help from another person bathing (including washing, rinsing, drying)?: A Little Help from another person to put on and taking off regular upper body clothing?: A Little Help from another person to put on and taking off regular lower body clothing?: A Little 6 Click Score: 20   End of Session Equipment Utilized During Treatment: Gait belt  Activity Tolerance: Patient tolerated treatment well Patient left: in bed;with call bell/phone within reach(Geoffrey Wells recieved w/o bed alarm set. RN aware.)  OT Visit Diagnosis: Other abnormalities of gait and mobility (R26.89)  Time: 1020-1101 OT Time Calculation (min): 41 min Charges:  OT General Charges $OT Visit: 1 Visit OT Evaluation $OT Eval Moderate Complexity: 1 Mod OT Treatments $Self Care/Home Management : 23-37 mins  Shara Blazing, M.S., OTR/L Ascom: [phone] 01/27/20, 4:41 PM

## 2020-01-27 NOTE — Progress Notes (Signed)
PROGRESS NOTE    Braylyn Rapier  [id number] DOB: Nov 02, 1945 DOA: 01/18/2020 PCP: [facility] [place]    Brief Narrative:  Jerrill Difranco a 74 y.o.malewith medical history significant forrecently diagnosed COVID-19 pneumonia who presents to the ED for worsening shortness of breath. Patient had a positive COVID-19 test on 01/10/2020 and was admitted at [facility] hospital from 01/10/2020-01/12/2020 for acute respiratory failure with hypoxemia due to COVID-19 pneumonia.  He was started on treatment with IV remdesivir and IV Decadron. He was discharged on 01/12/2020 on 2 L supplemental O2 via [place] and completed 5 days total outpatient IV remdesivir treatment at the Covid infusion clinic in [place]. Does not appear he was discharged with steroids.  Patient came back to the hospital again with increased short of breath, hypoxemia, currently he is on 5 L oxygen. Chest CT scan showed bilateral PE, bilateral lungs groundglass consolidation, worse on the right side. He is placed on heparin drip, restarted IV steroids.Anticoagulation since has changed to Eliquis.     Consultants:  Pulmonary  Procedures:   Antimicrobials:   Cefepime discontinued   Subjective: Starting to feel better today.  Less short of breath at rest, but becomes winded with ambulation.   Objective: Vitals:   01/25/20 1722 01/26/20 0108 01/26/20 1623 01/26/20 2333  BP: (!) 140/94 97/60 137/85 114/64  Pulse: 78 67 84 75  Resp: (!) 22 20 19 16   Temp: 97.9 F (36.6 C) 97.9 F (36.6 C) 98.1 F (36.7 C) 98.4 F (36.9 C)  TempSrc: Oral Oral Oral Oral  SpO2: 95% 98% 95% 98%  Weight:      Height:        Intake/Output Summary (Last 24 hours) at 01/27/2020 [id number] Last data filed at 01/26/2020 0900 Gross per 24 hour  Intake --  Output 400 ml  Net -400 ml   Filed Weights   01/18/20 2127 01/19/20 0758  Weight: 134.3 kg 134.3 kg    Examination:  General exam: Appears calm and comfortable NAD Respiratory  system: more Clear to auscultation. Respiratory effort normal.no wheezing Cardiovascular system: S1 & S2 heard, RRR. No JVD, murmurs, rubs, gallops or clicks.  Gastrointestinal system: Abdomen is nondistended, soft and nontender. Normal bowel sounds heard. Central nervous system: Grossly intact, alert and oriented Extremities: No edema Skin: Warm dry Psychiatry:  Mood & affect appropriate in current setting.     Data Reviewed: I have personally reviewed following labs and imaging studies  CBC: Recent Labs  Lab 01/21/20 0703 01/22/20 0626 01/23/20 0548 01/24/20 0621 01/26/20 1001  WBC 11.7* 10.9* 10.9* 15.0* 10.3  NEUTROABS 10.7* 9.7* 9.5* 12.9*  --   HGB 12.1* 11.9* 11.9* 13.0 12.9*  HCT 34.4* 33.8* 34.5* 38.7* 38.6*  MCV 85.1 84.3 86.5 88.8 89.6  PLT 305 266 310 311 [id number]   Basic Metabolic Panel: Recent Labs  Lab 01/21/20 0703 01/21/20 0703 01/22/20 0626 01/23/20 0548 01/24/20 0621 01/26/20 1001 01/27/20 0557  NA 138   < > 137 139 138 134* 136  K 4.1   < > 4.4 4.4 4.9 4.2 4.4  CL 104   < > 102 102 101 95* 97*  CO2 27   < > 27 29 28 28 28   GLUCOSE 124*   < > 130* 129* 119* 114* 137*  BUN 26*   < > 26* 25* 28* 29* 30*  CREATININE 0.76   < > 0.78 0.66 0.85 0.89 1.02  CALCIUM 8.3*   < > 8.3* 8.5* 8.7* 8.7* 8.8*  MG 2.3  --  2.1 2.3 2.2  --   --   PHOS 3.6  --  3.8 3.9  --   --   --    < > = values in this interval not displayed.   GFR: Estimated Creatinine Clearance: 94.2 mL/min (by C-G formula based on SCr of 1.02 mg/dL). Liver Function Tests: Recent Labs  Lab 01/21/20 0703 01/22/20 0626 01/23/20 0548  AST 139* 92* 67*  ALT 149* 135* 122*  ALKPHOS 78 72 69  BILITOT 0.7 0.8 0.9  PROT 5.8* 5.5* 5.8*  ALBUMIN 2.4* 2.4* 2.6*   No results for input(s): LIPASE, AMYLASE in the last 168 hours. No results for input(s): AMMONIA in the last 168 hours. Coagulation Profile: No results for input(s): INR, PROTIME in the last 168 hours. Cardiac Enzymes: No results for  input(s): CKTOTAL, CKMB, CKMBINDEX, TROPONINI in the last 168 hours. BNP (last 3 results) No results for input(s): PROBNP in the last 8760 hours. HbA1C: No results for input(s): HGBA1C in the last 72 hours. CBG: No results for input(s): GLUCAP in the last 168 hours. Lipid Profile: No results for input(s): CHOL, HDL, LDLCALC, TRIG, CHOLHDL, LDLDIRECT in the last 72 hours. Thyroid Function Tests: No results for input(s): TSH, T4TOTAL, FREET4, T3FREE, THYROIDAB in the last 72 hours. Anemia Panel: No results for input(s): VITAMINB12, FOLATE, FERRITIN, TIBC, IRON, RETICCTPCT in the last 72 hours. Sepsis Labs: Recent Labs  Lab 01/24/20 0621  PROCALCITON <0.10    No results found for this or any previous visit (from the past 240 hour(s)).       Radiology Studies: No results found.      Scheduled Meds: . apixaban  5 mg Oral BID  . vitamin C  500 mg Oral Daily  . furosemide  40 mg Intravenous Daily  . Ipratropium-Albuterol  1 puff Inhalation Q6H  . mouth rinse  15 mL Mouth Rinse BID  . methylPREDNISolone (SOLU-MEDROL) injection  40 mg Intravenous Q12H  . sodium chloride flush  3 mL Intravenous Q12H  . zinc sulfate  220 mg Oral Daily   Continuous Infusions:  Assessment & Plan:   Principal Problem:   Acute hypoxemic respiratory failure due to COVID-19 [facility]) Active Problems:   Bilateral pulmonary embolism (HCC)   #1.  Acute hypoxemic respiratory failure secondary to Covid pneumonia. Oxygenation slightly better.  Cefepime discontinued. Continue IV steroids as patient still requiring high flow oxygen with ambulation Pulmonary input was appreciated Lasix and albumin was added  2.  Acute bilateral PE.  Secondary to Covid infection. Continue Eliquis.will need f/u management as outpt  3.  Covid viral pneumonia. Discontinue cefepime as above.  Leukocytosis is secondary to IV steroids. Status post Remdesevir.  4.  Essential hypertension. Continue to follow.   DVT  prophylaxis:Eliquis Code Status:Full Family Communication:None Disposition Plan:Back to home with home care Barrier: Patient still requiring high 02 , Still requiring IV steroids. Likely will DC in couple of days as he is still not stable to be discharged home and has high risk of return       LOS: 9 days   Time spent: 45 minutes with more than 50% on [place], MD [facility] Pager [phone]  If 7PM-7AM, please contact night-coverage [email or web address] Password [facility] 01/27/2020, 8:22 AM Patient ID: Beniah Hommerding, male   DOB: 24-Oct-1945, 74 y.o.   MRN: [id number]

## 2020-01-27 NOTE — Consult Note (Signed)
Pulmonary Medicine          Date: 01/27/2020,   MRN# [id number] Geoffrey Wells 1946-02-04     AdmissionWeight: 134.3 kg                 CurrentWeight: 134.3 kg   Referring physician: Dr Kurtis Bushman   CHIEF COMPLAINT:   Acute bilateral PE in context of COVID19 pneumonia   SUBJECTIVE   Patient is sitting up in bed in no acute distress.  We discussed his imaging with multifocal PNA secondary to [place] as well as bilateral segmental PE.  Additionally patient has significant gastric hernia and may have been culprit of previous episodes of pneumonia from aspiration.   He is able to take tidal volumes on incentive spirometer in excess of 1Liter.  He is not able to do more with OT/PT although did have desaturation.    He is very pleased and appreciative with all his medical care here.   PAST MEDICAL HISTORY   Past Medical History:  Diagnosis Date  . Pneumonia due to COVID-19 virus 01/10/2020     SURGICAL HISTORY   Past Surgical History:  Procedure Laterality Date  . HERNIA REPAIR       FAMILY HISTORY   History reviewed. No pertinent family history.   SOCIAL HISTORY   Social History   Tobacco Use  . Smoking status: Former Smoker    Packs/day: 0.50    Years: 25.00    Pack years: 12.50    Types: Cigarettes  . Smokeless tobacco: Never Used  Substance Use Topics  . Alcohol use: Yes    Alcohol/week: 7.0 standard drinks    Types: 7 Cans of beer per week  . Drug use: Not Currently     MEDICATIONS    Home Medication:  Current Outpatient Rx  . Order #: [id number] Class: Print  . Order #: [id number] Class: Print    Current Medication:  Current Facility-Administered Medications:  .  acetaminophen (TYLENOL) tablet 650 mg, 650 mg, Oral, Q6H PRN, Lenore Cordia, MD .  [COMPLETED] apixaban (ELIQUIS) tablet 10 mg, 10 mg, Oral, BID, 10 mg at 01/26/20 2008 **FOLLOWED BY** apixaban (ELIQUIS) tablet 5 mg, 5 mg, Oral, BID, Sharen Hones, MD, 5 mg at 01/27/20 0902 .   ascorbic acid (VITAMIN C) tablet 500 mg, 500 mg, Oral, Daily, Zada Finders R, MD, 500 mg at 01/27/20 0902 .  chlorpheniramine-HYDROcodone (TUSSIONEX) 10-8 MG/5ML suspension 5 mL, 5 mL, Oral, Q12H PRN, Zada Finders R, MD .  furosemide (LASIX) injection 40 mg, 40 mg, Intravenous, Daily, Ottie Glazier, MD, 40 mg at 01/27/20 0903 .  guaiFENesin-dextromethorphan (ROBITUSSIN DM) 100-10 MG/5ML syrup 10 mL, 10 mL, Oral, Q4H PRN, Posey Pronto, Vishal R, MD .  Ipratropium-Albuterol (COMBIVENT) respimat 1 puff, 1 puff, Inhalation, Q6H, Lenore Cordia, MD, 1 puff at 01/27/20 0903 .  MEDLINE mouth rinse, 15 mL, Mouth Rinse, BID, Sharion Settler, NP, 15 mL at 01/27/20 0904 .  methylPREDNISolone sodium succinate (SOLU-MEDROL) 40 mg/mL injection 40 mg, 40 mg, Intravenous, Q12H, Zada Finders R, MD, 40 mg at 01/27/20 0043 .  ondansetron (ZOFRAN) tablet 4 mg, 4 mg, Oral, Q6H PRN **OR** ondansetron (ZOFRAN) injection 4 mg, 4 mg, Intravenous, Q6H PRN, Posey Pronto, Vishal R, MD .  senna-docusate (Senokot-S) tablet 1 tablet, 1 tablet, Oral, QHS PRN, Lenore Cordia, MD, 1 tablet at 01/19/20 2338 .  sodium chloride flush (NS) 0.9 % injection 3 mL, 3 mL, Intravenous, Q12H, Zada Finders R, MD, 3 mL at 01/27/20 0903 .  zinc sulfate capsule 220 mg, 220 mg, Oral, Daily, Zada Finders R, MD, 220 mg at 01/27/20 [id number]    ALLERGIES   Patient has no known allergies.     REVIEW OF SYSTEMS    Review of Systems:  Gen:  Denies  fever, sweats, chills weigh loss  HEENT: Denies blurred vision, double vision, ear pain, eye pain, hearing loss, nose bleeds, sore throat Cardiac:  No dizziness, chest pain or heaviness, chest tightness,edema Resp:   Denies cough or sputum porduction, shortness of breath,wheezing, hemoptysis,  Gi: Denies swallowing difficulty, stomach pain, nausea or vomiting, diarrhea, constipation, bowel incontinence Gu:  Denies bladder incontinence, burning urine Ext:   Denies Joint pain, stiffness or swelling Skin:  Denies  skin rash, easy bruising or bleeding or hives Endoc:  Denies polyuria, polydipsia , polyphagia or weight change Psych:   Denies depression, insomnia or hallucinations   Other:  All other systems negative   VS: BP 124/63 (BP Location: Left Arm)   Pulse 78   Temp 98.5 F (36.9 C) (Oral)   Resp 20   Ht 6\' 1"  (1.854 m)   Wt 134.3 kg   SpO2 100%   BMI 39.05 kg/m      PHYSICAL EXAM    GENERAL:NAD, no fevers, chills, no weakness no fatigue HEAD: Normocephalic, atraumatic.  EYES: Pupils equal, round, reactive to light. Extraocular muscles intact. No scleral icterus.  MOUTH: Moist mucosal membrane. Dentition intact. No abscess noted.  EAR, NOSE, THROAT: Clear without exudates. No external lesions.  NECK: Supple. No thyromegaly. No nodules. No JVD.  PULMONARY: mild rhonchorous breath sounds CARDIOVASCULAR: S1 and S2. Regular rate and rhythm. No murmurs, rubs, or gallops. No edema. Pedal pulses 2+ bilaterally.  GASTROINTESTINAL: Soft, nontender, nondistended. No masses. Positive bowel sounds. No hepatosplenomegaly.  MUSCULOSKELETAL: No swelling, clubbing, or edema. Range of motion full in all extremities.  NEUROLOGIC: Cranial nerves II through XII are intact. No gross focal neurological deficits. Sensation intact. Reflexes intact.  SKIN: No ulceration, lesions, rashes, or cyanosis. Skin warm and dry. Turgor intact.  PSYCHIATRIC: Mood, affect within normal limits. The patient is awake, alert and oriented x 3. Insight, judgment intact.       IMAGING    DG Chest 1 View  Result Date: 01/23/2020 CLINICAL DATA:  COVID-19 positivity EXAM: CHEST  1 VIEW COMPARISON:  01/18/2020 FINDINGS: Cardiac shadow is stable. Right upper lobe infiltrate is noted with volume loss. Patchy opacities are noted throughout both lungs consistent with the given clinical history of COVID-19 positivity. Hiatal hernia is noted. No bony abnormality is seen. IMPRESSION: Bilateral patchy opacities consistent  with COVID-19 positivity. Electronically Signed   By: Inez Catalina M.D.   On: 01/23/2020 15:20   CT Angio Chest PE W and/or Wo Contrast  Result Date: 01/18/2020 CLINICAL DATA:  Shortness of breath. EXAM: CT ANGIOGRAPHY CHEST WITH CONTRAST TECHNIQUE: Multidetector CT imaging of the chest was performed using the standard protocol during bolus administration of intravenous contrast. Multiplanar CT image reconstructions and MIPs were obtained to evaluate the vascular anatomy. CONTRAST:  129mL OMNIPAQUE IOHEXOL 350 MG/ML SOLN COMPARISON:  Jan 10, 2020 FINDINGS: Cardiovascular: There is moderate severity calcification of the thoracic aorta. Mild areas of intraluminal low attenuation are seen involving multiple upper lobe and lower lobe branches of the left pulmonary artery. Normal heart size. No pericardial effusion. Moderate severity coronary artery calcification is seen. Mediastinum/Nodes: No enlarged mediastinal, hilar, or axillary lymph nodes. Thyroid gland, trachea, and esophagus demonstrate no significant findings. Lungs/Pleura: Marked  severity diffuse patchy and ground-glass appearing infiltrates which is increased in severity when compared to the prior study. There is no evidence of a pleural effusion or pneumothorax. Upper Abdomen: There is a large gastric hernia. A subcentimeter gallstone is seen within the gallbladder lumen. Musculoskeletal: Multilevel degenerative changes seen throughout the thoracic spine. Review of the MIP images confirms the above findings. IMPRESSION: 1. Marked severity diffuse bilateral patchy and ground-glass appearing infiltrates. 2. Findings consistent with mild, bilateral upper lobe and lower lobe pulmonary emboli. 3. Large gastric hernia. 4. Cholelithiasis. Aortic Atherosclerosis (ICD10-I70.0). Electronically Signed   By: Virgina Norfolk M.D.   On: 01/18/2020 23:53   CT Angio Chest PE W and/or Wo Contrast  Result Date: 01/10/2020 CLINICAL DATA:  Hypoxemia EXAM: CT  ANGIOGRAPHY CHEST WITH CONTRAST TECHNIQUE: Multidetector CT imaging of the chest was performed using the standard protocol during bolus administration of intravenous contrast. Multiplanar CT image reconstructions and MIPs were obtained to evaluate the vascular anatomy. CONTRAST:  75mL OMNIPAQUE IOHEXOL 350 MG/ML SOLN COMPARISON:  None. FINDINGS: Cardiovascular: There is a optimal opacification of the pulmonary arteries. There is no central,segmental, or subsegmental filling defects within the pulmonary arteries. There is mild cardiomegaly. No pericardial effusion or thickening. No evidence right heart strain. There is normal three-vessel brachiocephalic anatomy. The ascending intrathoracic aorta appears to be dilated measuring up to 4.3 cm which tapers at the level of the aortic arch, however contrast is preferentially within the pulmonary arteries. Scattered aortic atherosclerosis is seen. There is also atherosclerosis seen at the origin of the great vessels. Mediastinum/Nodes: No hilar, mediastinal, or axillary adenopathy. Thyroid gland, trachea, and esophagus demonstrate no significant findings. Lungs/Pleura: Ground-glass hazy airspace opacities seen throughout the right lung with patchy airspace opacities seen posteriorly at the right lung base. There is also patchy airspace opacity seen at the posterior left lung base. No pleural effusion is seen. Upper Abdomen: No acute abnormalities present in the visualized portions of the upper abdomen. Musculoskeletal: No chest wall abnormality. No acute or significant osseous findings. Review of the MIP images confirms the above findings. IMPRESSION: 1. No central, segmental, or subsegmental pulmonary embolism. 2. Multifocal patchy ground-glass hazy airspace opacities seen throughout both lungs right greater than left, likely consistent with multifocal pneumonia. 3. 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 Recommend annual imaging followup by CTA or MRA. This  recommendation follows 2010 ACCF/AHA/AATS/ACR/ASA/SCA/SCAI/SIR/STS/SVM Guidelines for the Diagnosis and Management of Patients with Thoracic Aortic Disease. Circulation. 2010; [id number]. Aortic aneurysm NOS (ICD10-I71.9) 4.  Aortic Atherosclerosis (ICD10-I70.0). Electronically Signed   By: Prudencio Pair M.D.   On: 01/10/2020 23:00   DG Chest Portable 1 View  Result Date: 01/18/2020 CLINICAL DATA:  Worsening shortness of breath, COVID positive. EXAM: PORTABLE CHEST 1 VIEW COMPARISON:  None. FINDINGS: Moderate to marked severity infiltrate is seen within the right upper lobe with mild infiltrates seen along the periphery of the left lung and right lung base. There is no evidence of a pleural effusion or pneumothorax. The heart size and mediastinal contours are within normal limits. A small hiatal hernia is seen. The visualized skeletal structures are unremarkable. IMPRESSION: 1. Moderate to marked severity right upper lobe infiltrate. 2. Mild bilateral infiltrates. Electronically Signed   By: Virgina Norfolk M.D.   On: 01/18/2020 21:57      ASSESSMENT/PLAN   Acute on chronic hypoxemic respiratory failure  - BNP is mildly elevated at 182-which is very good  -will obtain new HS-troponin to eval for RH strain -  no signs of this on CT chest -due to bilateral atypical pneumonia secondary to [place] -continue solumedrol  -s/p remdesevir x5d -Adding lasix and albumin- net negative 10.3L as of 01/27/20   Bilateral- segmental pulmonary emboli  clot burden is low - not a candidate for thrombectomy -Vital signs are stable on supplemental O2  -continue with eliquis BID PT/OT -ordered    Large Hiatal hernia   predisposes patient to recurrent regurgitation and aspiration - will evaluate on outpatient in clinic     Will continue to follow with you,   Thank you for allowing me to participate in the care of this patient.    Patient/Family are satisfied with care plan and all questions have been  answered.   This document was prepared using Dragon voice recognition software and may include unintentional dictation errors.     Ottie Glazier, M.D.  Division of [place]

## 2020-01-28 ENCOUNTER — Inpatient Hospital Stay: Payer: No Typology Code available for payment source

## 2020-01-28 MED ORDER — APIXABAN 5 MG PO TABS: 5.0000 mg | ORAL_TABLET | Freq: Two times a day (BID) | ORAL | 1 refills | Status: DC

## 2020-01-28 MED ORDER — IPRATROPIUM-ALBUTEROL 20-100 MCG/ACT IN AERS: 1.0000 | INHALATION_SPRAY | Freq: Four times a day (QID) | RESPIRATORY_TRACT | 0 refills | Status: DC

## 2020-01-28 MED ORDER — DOCUSATE SODIUM 100 MG PO CAPS
100.0000 mg | ORAL_CAPSULE | Freq: Every day | ORAL | Status: DC
  Filled 2020-01-28 (×2): qty 1

## 2020-01-28 MED ORDER — SULFAMETHOXAZOLE-TRIMETHOPRIM 400-80 MG PO TABS: 1.0000 | ORAL_TABLET | Freq: Two times a day (BID) | ORAL | 0 refills | Status: AC

## 2020-01-28 MED ORDER — PREDNISONE 10 MG PO TABS
10.0000 mg | ORAL_TABLET | Freq: Every day | ORAL | 0 refills | Status: DC
Start: 2020-01-29 — End: 2021-03-30

## 2020-01-28 MED ORDER — ASCORBIC ACID 500 MG PO TABS: 500.0000 mg | ORAL_TABLET | Freq: Every day | ORAL | 0 refills | Status: DC

## 2020-01-28 MED ORDER — PANTOPRAZOLE SODIUM 20 MG PO TBEC
20.0000 mg | DELAYED_RELEASE_TABLET | Freq: Every day | ORAL | Status: DC
  Administered 2020-01-28: 20 mg via ORAL
  Filled 2020-01-28 (×2): qty 1

## 2020-01-28 MED ORDER — PANTOPRAZOLE SODIUM 20 MG PO TBEC: 20.0000 mg | DELAYED_RELEASE_TABLET | Freq: Every day | ORAL | 0 refills | Status: DC

## 2020-01-28 NOTE — TOC Transition Note (Signed)
Transition of Care [facility]) - CM/SW Discharge Note   Patient Details  Name: Geoffrey Wells MRN: [id number] Date of Birth: 1945-09-07  [facility] CM/SW Contact:  Elease Hashimoto, [name] Phone Number: 01/28/2020, 4:30 PM   Clinical Narrative:  Pt medically ready to go home tomorrow. Already has home O2 from last admit. [place] to provide follow up-PT,OT aide. MD reports actually not going home until tomorrow. Will need to bring portable O2 to hospital to be able to go home via car.  Will ask weekend coverage to check regarding any further needs tomorrow.    Final next level of care: House Barriers to Discharge: Barriers Resolved   Patient Goals and CMS Choice Patient states their goals for this hospitalization and ongoing recovery are:: Patient would like to go home CMS Medicare.gov Compare Post Acute Care list provided to:: Patient Choice offered to / list presented to : Patient  Discharge Placement                Patient to be transferred to facility by: Wife via car Name of family member notified: Wife Patient and family notified of of transfer: 01/28/20  Discharge Plan and Services   Discharge Planning Services: CM Consult Post Acute Care Choice: Home Health                    HH Arranged: RN, PT, OT, Nurse's Aide [facility] Agency: [place] ([place]) Date HH Agency Contacted: 01/25/20 Time [place]: 1202 Representative spoke with at [place]: [place] (SDOH) Interventions     Readmission Risk Interventions No flowsheet data found.

## 2020-01-28 NOTE — Progress Notes (Signed)
Pulmonary Medicine          Date: 01/28/2020,   MRN# [id number] Geoffrey Wells 1945-09-11     AdmissionWeight: 134.3 kg                 CurrentWeight: 134.3 kg   Referring physician: Dr Kurtis Bushman   CHIEF COMPLAINT:   Acute bilateral PE in context of COVID19 pneumonia   SUBJECTIVE   Patient is sitting up in bed in no acute distress.    He had PT today with SpO2 >92% entire time. He is speaking in full sentences on room air .  From pulmonary perspective he is cleared for discharge.   May D/c on Prednisone 50 with taper over 1 wk by 10mg  each day. Please also give with bactrim single strength BID x 7days  PAST MEDICAL HISTORY   Past Medical History:  Diagnosis Date  . Pneumonia due to COVID-19 virus 01/10/2020     SURGICAL HISTORY   Past Surgical History:  Procedure Laterality Date  . HERNIA REPAIR       FAMILY HISTORY   History reviewed. No pertinent family history.   SOCIAL HISTORY   Social History   Tobacco Use  . Smoking status: Former Smoker    Packs/day: 0.50    Years: 25.00    Pack years: 12.50    Types: Cigarettes  . Smokeless tobacco: Never Used  Substance Use Topics  . Alcohol use: Yes    Alcohol/week: 7.0 standard drinks    Types: 7 Cans of beer per week  . Drug use: Not Currently     MEDICATIONS    Home Medication:  Current Outpatient Rx  . Order #: [id number] Class: Print  . Order #: [id number] Class: Print    Current Medication:  Current Facility-Administered Medications:  .  acetaminophen (TYLENOL) tablet 650 mg, 650 mg, Oral, Q6H PRN, Lenore Cordia, MD .  [COMPLETED] apixaban (ELIQUIS) tablet 10 mg, 10 mg, Oral, BID, 10 mg at 01/26/20 2008 **FOLLOWED BY** apixaban (ELIQUIS) tablet 5 mg, 5 mg, Oral, BID, Sharen Hones, MD, 5 mg at 01/28/20 1021 .  ascorbic acid (VITAMIN C) tablet 500 mg, 500 mg, Oral, Daily, Zada Finders R, MD, 500 mg at 01/28/20 1021 .  chlorpheniramine-HYDROcodone (TUSSIONEX) 10-8 MG/5ML suspension 5 mL,  5 mL, Oral, Q12H PRN, Posey Pronto, Vishal R, MD .  docusate sodium (COLACE) capsule 100 mg, 100 mg, Oral, Daily, Amery, Sahar, MD .  guaiFENesin-dextromethorphan (ROBITUSSIN DM) 100-10 MG/5ML syrup 10 mL, 10 mL, Oral, Q4H PRN, Posey Pronto, Vishal R, MD .  Ipratropium-Albuterol (COMBIVENT) respimat 1 puff, 1 puff, Inhalation, Q6H, Lenore Cordia, MD, 1 puff at 01/28/20 1338 .  MEDLINE mouth rinse, 15 mL, Mouth Rinse, BID, Sharion Settler, NP, 15 mL at 01/28/20 1029 .  ondansetron (ZOFRAN) tablet 4 mg, 4 mg, Oral, Q6H PRN **OR** ondansetron (ZOFRAN) injection 4 mg, 4 mg, Intravenous, Q6H PRN, Posey Pronto, Vishal R, MD .  senna-docusate (Senokot-S) tablet 1 tablet, 1 tablet, Oral, QHS PRN, Lenore Cordia, MD, 1 tablet at 01/19/20 2338 .  sodium chloride flush (NS) 0.9 % injection 3 mL, 3 mL, Intravenous, Q12H, Patel, Vishal R, MD, 3 mL at 01/28/20 1021 .  zinc sulfate capsule 220 mg, 220 mg, Oral, Daily, Zada Finders R, MD, 220 mg at 01/28/20 1021    ALLERGIES   Patient has no known allergies.     REVIEW OF SYSTEMS    Review of Systems:  Gen:  Denies  fever, sweats, chills weigh loss  HEENT: Denies blurred vision, double vision, ear pain, eye pain, hearing loss, nose bleeds, sore throat Cardiac:  No dizziness, chest pain or heaviness, chest tightness,edema Resp:   Denies cough or sputum porduction, shortness of breath,wheezing, hemoptysis,  Gi: Denies swallowing difficulty, stomach pain, nausea or vomiting, diarrhea, constipation, bowel incontinence Gu:  Denies bladder incontinence, burning urine Ext:   Denies Joint pain, stiffness or swelling Skin: Denies  skin rash, easy bruising or bleeding or hives Endoc:  Denies polyuria, polydipsia , polyphagia or weight change Psych:   Denies depression, insomnia or hallucinations   Other:  All other systems negative   VS: BP 140/69 (BP Location: Right Arm)   Pulse 77   Temp 97.9 F (36.6 C) (Oral)   Resp 17   Ht 6\' 1"  (1.854 m)   Wt 134.3 kg    SpO2 96%   BMI 39.05 kg/m      PHYSICAL EXAM    GENERAL:NAD, no fevers, chills, no weakness no fatigue HEAD: Normocephalic, atraumatic.  EYES: Pupils equal, round, reactive to light. Extraocular muscles intact. No scleral icterus.  MOUTH: Moist mucosal membrane. Dentition intact. No abscess noted.  EAR, NOSE, THROAT: Clear without exudates. No external lesions.  NECK: Supple. No thyromegaly. No nodules. No JVD.  PULMONARY: mild rhonchorous breath sounds CARDIOVASCULAR: S1 and S2. Regular rate and rhythm. No murmurs, rubs, or gallops. No edema. Pedal pulses 2+ bilaterally.  GASTROINTESTINAL: Soft, nontender, nondistended. No masses. Positive bowel sounds. No hepatosplenomegaly.  MUSCULOSKELETAL: No swelling, clubbing, or edema. Range of motion full in all extremities.  NEUROLOGIC: Cranial nerves II through XII are intact. No gross focal neurological deficits. Sensation intact. Reflexes intact.  SKIN: No ulceration, lesions, rashes, or cyanosis. Skin warm and dry. Turgor intact.  PSYCHIATRIC: Mood, affect within normal limits. The patient is awake, alert and oriented x 3. Insight, judgment intact.       IMAGING    DG Chest 1 View  Result Date: 01/23/2020 CLINICAL DATA:  COVID-19 positivity EXAM: CHEST  1 VIEW COMPARISON:  01/18/2020 FINDINGS: Cardiac shadow is stable. Right upper lobe infiltrate is noted with volume loss. Patchy opacities are noted throughout both lungs consistent with the given clinical history of COVID-19 positivity. Hiatal hernia is noted. No bony abnormality is seen. IMPRESSION: Bilateral patchy opacities consistent with COVID-19 positivity. Electronically Signed   By: Inez Catalina M.D.   On: 01/23/2020 15:20   CT Angio Chest PE W and/or Wo Contrast  Result Date: 01/18/2020 CLINICAL DATA:  Shortness of breath. EXAM: CT ANGIOGRAPHY CHEST WITH CONTRAST TECHNIQUE: Multidetector CT imaging of the chest was performed using the standard protocol during bolus  administration of intravenous contrast. Multiplanar CT image reconstructions and MIPs were obtained to evaluate the vascular anatomy. CONTRAST:  12mL OMNIPAQUE IOHEXOL 350 MG/ML SOLN COMPARISON:  Jan 10, 2020 FINDINGS: Cardiovascular: There is moderate severity calcification of the thoracic aorta. Mild areas of intraluminal low attenuation are seen involving multiple upper lobe and lower lobe branches of the left pulmonary artery. Normal heart size. No pericardial effusion. Moderate severity coronary artery calcification is seen. Mediastinum/Nodes: No enlarged mediastinal, hilar, or axillary lymph nodes. Thyroid gland, trachea, and esophagus demonstrate no significant findings. Lungs/Pleura: Marked severity diffuse patchy and ground-glass appearing infiltrates which is increased in severity when compared to the prior study. There is no evidence of a pleural effusion or pneumothorax. Upper Abdomen: There is a large gastric hernia. A subcentimeter gallstone is seen within the gallbladder lumen. Musculoskeletal: Multilevel degenerative changes seen throughout the thoracic  spine. Review of the MIP images confirms the above findings. IMPRESSION: 1. Marked severity diffuse bilateral patchy and ground-glass appearing infiltrates. 2. Findings consistent with mild, bilateral upper lobe and lower lobe pulmonary emboli. 3. Large gastric hernia. 4. Cholelithiasis. Aortic Atherosclerosis (ICD10-I70.0). Electronically Signed   By: Virgina Norfolk M.D.   On: 01/18/2020 23:53   CT Angio Chest PE W and/or Wo Contrast  Result Date: 01/10/2020 CLINICAL DATA:  Hypoxemia EXAM: CT ANGIOGRAPHY CHEST WITH CONTRAST TECHNIQUE: Multidetector CT imaging of the chest was performed using the standard protocol during bolus administration of intravenous contrast. Multiplanar CT image reconstructions and MIPs were obtained to evaluate the vascular anatomy. CONTRAST:  81mL OMNIPAQUE IOHEXOL 350 MG/ML SOLN COMPARISON:  None. FINDINGS:  Cardiovascular: There is a optimal opacification of the pulmonary arteries. There is no central,segmental, or subsegmental filling defects within the pulmonary arteries. There is mild cardiomegaly. No pericardial effusion or thickening. No evidence right heart strain. There is normal three-vessel brachiocephalic anatomy. The ascending intrathoracic aorta appears to be dilated measuring up to 4.3 cm which tapers at the level of the aortic arch, however contrast is preferentially within the pulmonary arteries. Scattered aortic atherosclerosis is seen. There is also atherosclerosis seen at the origin of the great vessels. Mediastinum/Nodes: No hilar, mediastinal, or axillary adenopathy. Thyroid gland, trachea, and esophagus demonstrate no significant findings. Lungs/Pleura: Ground-glass hazy airspace opacities seen throughout the right lung with patchy airspace opacities seen posteriorly at the right lung base. There is also patchy airspace opacity seen at the posterior left lung base. No pleural effusion is seen. Upper Abdomen: No acute abnormalities present in the visualized portions of the upper abdomen. Musculoskeletal: No chest wall abnormality. No acute or significant osseous findings. Review of the MIP images confirms the above findings. IMPRESSION: 1. No central, segmental, or subsegmental pulmonary embolism. 2. Multifocal patchy ground-glass hazy airspace opacities seen throughout both lungs right greater than left, likely consistent with multifocal pneumonia. 3. 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 Recommend annual imaging followup by CTA or MRA. This recommendation follows 2010 ACCF/AHA/AATS/ACR/ASA/SCA/SCAI/SIR/STS/SVM Guidelines for the Diagnosis and Management of Patients with Thoracic Aortic Disease. Circulation. 2010; [id number]. Aortic aneurysm NOS (ICD10-I71.9) 4.  Aortic Atherosclerosis (ICD10-I70.0). Electronically Signed   By: Prudencio Pair M.D.   On: 01/10/2020 23:00   DG Chest  Portable 1 View  Result Date: 01/18/2020 CLINICAL DATA:  Worsening shortness of breath, COVID positive. EXAM: PORTABLE CHEST 1 VIEW COMPARISON:  None. FINDINGS: Moderate to marked severity infiltrate is seen within the right upper lobe with mild infiltrates seen along the periphery of the left lung and right lung base. There is no evidence of a pleural effusion or pneumothorax. The heart size and mediastinal contours are within normal limits. A small hiatal hernia is seen. The visualized skeletal structures are unremarkable. IMPRESSION: 1. Moderate to marked severity right upper lobe infiltrate. 2. Mild bilateral infiltrates. Electronically Signed   By: Virgina Norfolk M.D.   On: 01/18/2020 21:57      ASSESSMENT/PLAN   Acute on chronic hypoxemic respiratory failure  - BNP is mildly elevated at 182-which is very good  -will obtain new HS-troponin to eval for RH strain - no signs of this on CT chest -due to bilateral atypical pneumonia secondary to [place] -continue solumedrol  -s/p remdesevir x5d -Adding lasix and albumin- net negative 10.3L as of 01/27/20   Bilateral- segmental pulmonary emboli  clot burden is low - not a candidate for thrombectomy -Vital signs are stable on  supplemental O2  -continue with eliquis BID PT/OT -ordered    Large Hiatal hernia   predisposes patient to recurrent regurgitation and aspiration - will evaluate on outpatient in clinic     Will continue to follow with you,   Thank you for allowing me to participate in the care of this patient.    Patient/Family are satisfied with care plan and all questions have been answered.   This document was prepared using Dragon voice recognition software and may include unintentional dictation errors.     Ottie Glazier, M.D.  Division of [facility]

## 2020-01-28 NOTE — Progress Notes (Signed)
Physical Therapy Treatment Patient Details Name: Geoffrey Wells MRN: [id number] DOB: [birth year] Today's Date: 01/28/2020    History of Present Illness presented to ER secondary to worsening cough, SOB, increased WOB; admitted for management of acute hypoxemic respiratory failure related to COVID-19 PNA, bilat PE (anticoagulation initiated 5/19 0020).    PT Comments    Pt was long sitting in bed with 2 L o2 donned. He agrees to PT session and is cooperative and pleasant. sao2 on 2 L [place] 98%. Discontinued O2 throughout session with sao2 > 92%. He tolerated ambulation around his room ~ 200 ft without AD or LOB. He tolerated well. Pt then performed several dynamic, higher level, balance exercises without difficulty. Pt has some dynamic balance deficits that pt will benefit from continued skilled PT to address. Rn aware pt off O2 and will continue to monitor. Acute PT will return to trial stairs when pt off isolation precautions. Per pt, " I'll be off precautions tomorrow."       Follow Up Recommendations  Home health PT     Equipment Recommendations  None recommended by PT    Recommendations for Other Services       Precautions / Restrictions Precautions Precautions: Fall Restrictions Weight Bearing Restrictions: No    Mobility  Bed Mobility Overal bed mobility: Modified Independent                Transfers Overall transfer level: Modified independent Equipment used: None Transfers: Sit to/from Stand Sit to Stand: Modified independent (Device/Increase time)            Ambulation/Gait Ambulation/Gait assistance: Modified independent (Device/Increase time) Gait Distance (Feet): 200 Feet Assistive device: None Gait Pattern/deviations: WFL(Within Functional Limits) Gait velocity: WNL   General Gait Details: no LOB with ambulation without AD   Stairs Stairs: (will trial stairs after precautions removed tomorro)           Wheelchair Mobility    Modified Rankin  (Stroke Patients Only)       Balance Overall balance assessment: Needs assistance Sitting-balance support: No upper extremity supported;Feet supported Sitting balance-Leahy Scale: Good Sitting balance - Comments: Steady static sitting, reaching within BOS.   Standing balance support: No upper extremity supported Standing balance-Leahy Scale: Good Standing balance comment: Steady static standing w/o UE support.               High Level Balance Comments: Pt was able to progress to dynamic balance exercises without use of AD. performed several SLS exercises, modified tandem, static standing FT eyes open/closed, marching, alternating toe tapping on object.            Cognition Arousal/Alertness: Awake/alert Behavior During Therapy: WFL for tasks assessed/performed Overall Cognitive Status: Within Functional Limits for tasks assessed                                 General Comments: Pt A&O x4. Pleasant, cooperative t/o session.      Exercises      General Comments        Pertinent Vitals/Pain Pain Assessment: No/denies pain    Home Living                      Prior Function            PT Goals (current goals can now be found in the care plan section) Acute Rehab PT Goals Patient Stated Goal: To go home  Progress towards PT goals: Progressing toward goals    Frequency    Min 2X/week      PT Plan Current plan remains appropriate    Co-evaluation              AM-PAC PT "6 Clicks" Mobility   Outcome Measure  Help needed turning from your back to your side while in a flat bed without using bedrails?: None Help needed moving from lying on your back to sitting on the side of a flat bed without using bedrails?: None Help needed moving to and from a bed to a chair (including a wheelchair)?: None Help needed standing up from a chair using your arms (e.g., wheelchair or bedside chair)?: None Help needed to walk in hospital room?:  A Little Help needed climbing 3-5 steps with a railing? : A Little 6 Click Score: 22    End of Session Equipment Utilized During Treatment: Gait belt Activity Tolerance: Patient tolerated treatment well Patient left: in bed;with call bell/phone within reach;with nursing/sitter in room Nurse Communication: Mobility status PT Visit Diagnosis: Difficulty in walking, not elsewhere classified (R26.2);Muscle weakness (generalized) (M62.81)     Time: [id number] PT Time Calculation (min) (ACUTE ONLY): 24 min  Charges:  $Gait Training: 8-22 mins $Neuromuscular Re-education: 8-22 mins                     Julaine Fusi PTA 01/28/20, 11:47 AM

## 2020-01-28 NOTE — Progress Notes (Signed)
PROGRESS NOTE    Geoffrey Wells  [id number] DOB: 1945/09/22 DOA: 01/18/2020 PCP: [facility] [place]    Brief Narrative:  Geoffrey Wells a 74 y.o.malewith medical history significant forrecently diagnosed COVID-19 pneumonia who presents to the ED for worsening shortness of breath. Patient had a positive COVID-19 test on 01/10/2020 and was admitted at [facility] hospital from 01/10/2020-01/12/2020 for acute respiratory failure with hypoxemia due to COVID-19 pneumonia.  He was started on treatment with IV remdesivir and IV Decadron. He was discharged on 01/12/2020 on 2 L supplemental O2 via [place] and completed 5 days total outpatient IV remdesivir treatment at the Covid infusion clinic in [place]. Does not appear he was discharged with steroids.  Patient came back to the hospital again with increased short of breath, hypoxemia, currently he is on 5 L oxygen. Chest CT scan showed bilateral PE, bilateral lungs groundglass consolidation, worse on the right side. He is placed on heparin drip, restarted IV steroids.Anticoagulation since has changed to Eliquis.     Consultants:  Pulmonary  Procedures:   Antimicrobials:   Cefepime discontinued   Subjective: Feeling better/ bp dropped earlier but improved.  Objective: Vitals:   01/27/20 1406 01/27/20 2323 01/28/20 0937 01/28/20 1601  BP: 108/78 (!) 93/53 140/69 (!) 142/86  Pulse: 97 78 77 89  Resp: 17 16 17 16   Temp: 98.5 F (36.9 C) 97.8 F (36.6 C) 97.9 F (36.6 C) (!) 97.5 F (36.4 C)  TempSrc: Oral Oral Oral Oral  SpO2: 93% 96% 96% 91%  Weight:      Height:       No intake or output data in the 24 hours ending 01/28/20 1620 Filed Weights   01/18/20 2127 01/19/20 0758  Weight: 134.3 kg 134.3 kg    Examination:  General exam: Appears calm and comfortable  Respiratory system: more Clear to auscultation. Respiratory effort normal.no wheezing or rhonchi Cardiovascular system: S1 & S2 heard, RRR. No JVD, murmurs,  rubs, gallops or clicks.  Gastrointestinal system: Abdomen is nondistended, soft and nontender. Normal bowel sounds heard. Central nervous system: Grossly intact, alert and oriented Extremities: No edema Skin: Warm dry Psychiatry:  Mood & affect appropriate in current setting.     Data Reviewed: I have personally reviewed following labs and imaging studies  CBC: Recent Labs  Lab 01/22/20 0626 01/23/20 0548 01/24/20 0621 01/26/20 1001  WBC 10.9* 10.9* 15.0* 10.3  NEUTROABS 9.7* 9.5* 12.9*  --   HGB 11.9* 11.9* 13.0 12.9*  HCT 33.8* 34.5* 38.7* 38.6*  MCV 84.3 86.5 88.8 89.6  PLT 266 310 311 [id number]   Basic Metabolic Panel: Recent Labs  Lab 01/22/20 0626 01/23/20 0548 01/24/20 0621 01/26/20 1001 01/27/20 0557  NA 137 139 138 134* 136  K 4.4 4.4 4.9 4.2 4.4  CL 102 102 101 95* 97*  CO2 27 29 28 28 28   GLUCOSE 130* 129* 119* 114* 137*  BUN 26* 25* 28* 29* 30*  CREATININE 0.78 0.66 0.85 0.89 1.02  CALCIUM 8.3* 8.5* 8.7* 8.7* 8.8*  MG 2.1 2.3 2.2  --   --   PHOS 3.8 3.9  --   --   --    GFR: Estimated Creatinine Clearance: 94.2 mL/min (by C-G formula based on SCr of 1.02 mg/dL). Liver Function Tests: Recent Labs  Lab 01/22/20 0626 01/23/20 0548  AST 92* 67*  ALT 135* 122*  ALKPHOS 72 69  BILITOT 0.8 0.9  PROT 5.5* 5.8*  ALBUMIN 2.4* 2.6*   No results for input(s): LIPASE,  AMYLASE in the last 168 hours. No results for input(s): AMMONIA in the last 168 hours. Coagulation Profile: No results for input(s): INR, PROTIME in the last 168 hours. Cardiac Enzymes: No results for input(s): CKTOTAL, CKMB, CKMBINDEX, TROPONINI in the last 168 hours. BNP (last 3 results) No results for input(s): PROBNP in the last 8760 hours. HbA1C: No results for input(s): HGBA1C in the last 72 hours. CBG: No results for input(s): GLUCAP in the last 168 hours. Lipid Profile: No results for input(s): CHOL, HDL, LDLCALC, TRIG, CHOLHDL, LDLDIRECT in the last 72 hours. Thyroid Function  Tests: No results for input(s): TSH, T4TOTAL, FREET4, T3FREE, THYROIDAB in the last 72 hours. Anemia Panel: No results for input(s): VITAMINB12, FOLATE, FERRITIN, TIBC, IRON, RETICCTPCT in the last 72 hours. Sepsis Labs: Recent Labs  Lab 01/24/20 0621  PROCALCITON <0.10    No results found for this or any previous visit (from the past 240 hour(s)).       Radiology Studies: No results found.      Scheduled Meds: . apixaban  5 mg Oral BID  . vitamin C  500 mg Oral Daily  . docusate sodium  100 mg Oral Daily  . Ipratropium-Albuterol  1 puff Inhalation Q6H  . mouth rinse  15 mL Mouth Rinse BID  . pantoprazole  20 mg Oral Daily  . sodium chloride flush  3 mL Intravenous Q12H  . zinc sulfate  220 mg Oral Daily   Continuous Infusions:  Assessment & Plan:   Principal Problem:   Acute hypoxemic respiratory failure due to COVID-19 [facility]) Active Problems:   Bilateral pulmonary embolism ([place])   #1.  Acute hypoxemic respiratory failure secondary to Covid pneumonia. Oxygenation slightly better.  Cefepime discontinued. Continue IV steroids as patient still requiring high flow oxygen with ambulation Pulmonary input was appreciated Lasix and albumin was added Now improved at baseline  2.  Acute bilateral PE.  Secondary to Covid infection. Continue Eliquis.will need f/u management as outpt  3.  Covid viral pneumonia. Discontinue cefepime as above.  Leukocytosis is secondary to IV steroids. Status post Remdesevir.  4.  Essential hypertension. Continue to follow.  Will DC Lasix since BP dropped.  Pulmonary was notified.   DVT prophylaxis:Eliquis Code Status:Full Family Communication:None Disposition Plan:Back to home with home care Barrier: Setting up patient's home oxygen and home health with other medications as family is preparing all of this.  Will DC tomorrow for safe discharge       LOS: 10 days   Time spent: 45 minutes with more than 50% on  [place], MD [facility] Pager [phone]  If 7PM-7AM, please contact night-coverage [email or web address] Password [email or web address] 01/28/2020, 4:20 PM Patient ID: Geoffrey Wells, male   DOB: December 01, 1945, 74 y.o.   MRN: [id number]

## 2020-01-29 LAB — CBC
HCT: 35.4 % — ABNORMAL LOW (ref 39.0–52.0)
Hemoglobin: 11.9 g/dL — ABNORMAL LOW (ref 13.0–17.0)
MCH: 29.9 pg (ref 26.0–34.0)
MCHC: 33.6 g/dL (ref 30.0–36.0)
MCV: 88.9 fL (ref 80.0–100.0)
Platelets: 243 10*3/uL (ref 150–400)
RBC: 3.98 MIL/uL — ABNORMAL LOW (ref 4.22–5.81)
RDW: 15.4 % (ref 11.5–15.5)
WBC: 7.1 10*3/uL (ref 4.0–10.5)
nRBC: 0 % (ref 0.0–0.2)

## 2020-01-29 NOTE — Discharge Summary (Signed)
Maximilliano Wheeler [id number] DOB: Apr 03, 1946 DOA: 01/18/2020  PCP: [facility]  Admit date: 01/18/2020 Discharge date: 01/29/2020  Admitted From: Home Disposition: Home  Recommendations for Outpatient Follow-up:  1. Follow up with PCP in 1 week 2. Please obtain BMP/CBC in one week      Discharge Condition:Stable CODE STATUS: Full Diet recommendation: Heart Healthy Brief/Interim Summary: Adan Dewaard is a 74 y.o. male with medical history significant for recently diagnosed COVID-19 pneumonia who presents to the ED for worsening shortness of breath.  Patient had a positive COVID-19 test on 01/10/2020 and was admitted at [facility] hospital from 01/10/2020-01/12/2020 for acute respiratory failure with hypoxemia due to COVID-19 pneumonia. He was started on treatment with IV remdesivir and IV Decadron.  He was discharged on 01/12/2020 on 2 L supplemental O2 via [place] and completed 5 days total outpatient IV remdesivir treatment at the Covid infusion clinic in [place].  Patient reported having some increased shortness of breath and O2 requirement.Portable chest x-ray shows marked right upper lobe infiltrate with mild bilateral infiltrates.CTA chest PE study showed marked severity diffuse bilateral patchy groundglass appearing infiltrates and findings consistent with mild, bilateral upper lobe and lower lobe pulmonary emboli.  Large gastric hernia and cholelithiasis also noted.  He was started on heparin and transition to Eliquis.  Also started on IV steroids.  Since he continued requiring high liters of oxygen consulted.  1. Acute hypoxemic respiratory failure secondary to Covid pneumonia. Treated with IV steroids. Pulmonary input was appreciated Was given Lasix and albumin was added Now improved at baseline, with physical therapy while ambulating he was with   SpO2 >92% entire time, and speaking full sentences on RA. Per Pulmonary recommendation, will d/c on prednisone 50mg  with taper over 1  week by 10mg  eAch day. Also discharge with bactrim SS bid x7 days S/p Remdesevir x5 days  2. Acute bilateral PE. Secondary to Covid infection. Continue Eliquis.will need f/u management as outpt  3. Covid viral pneumonia. Status post Remdesevir. See above  4. Essential hypertension. Stable.  D/c'd lasix as bp was on low side, pccm in agreement.  Discharge Diagnoses:  Principal Problem:   Acute hypoxemic respiratory failure due to COVID-19 [facility]) Active Problems:   Bilateral pulmonary embolism [facility])    Discharge Instructions  Discharge Instructions    Call MD for:  difficulty breathing, headache or visual disturbances   Complete by: As directed    Call MD for:  temperature >100.4   Complete by: As directed    DME Bedside commode   Complete by: As directed    Patient needs a bedside commode to treat with the following condition: Weakness   Diet - low sodium heart healthy   Complete by: As directed    Diet - low sodium heart healthy   Complete by: As directed    Discharge instructions   Complete by: As directed    Follow up with pcp in one week F/u with pulmonary  Do not miss your Eliquis dose   Increase activity slowly   Complete by: As directed    Increase activity slowly   Complete by: As directed      Allergies as of 01/29/2020   No Known Allergies     Medication List    TAKE these medications   apixaban 5 MG Tabs tablet Commonly known as: ELIQUIS Take 1 tablet (5 mg total) by mouth 2 (two) times daily.   ascorbic acid 500 MG tablet Commonly known as: VITAMIN C Take 1  tablet (500 mg total) by mouth daily.   Emergen-C Vitamin C Pack Take 1 packet by mouth daily.   Ipratropium-Albuterol 20-100 MCG/ACT Aers respimat Commonly known as: COMBIVENT Inhale 1 puff into the lungs every 6 (six) hours.   IVERMECTIN PO Take 6 mg by mouth daily.   ondansetron 4 MG disintegrating tablet Commonly known as: Zofran ODT Take 1 tablet (4 mg total) by mouth  every 8 (eight) hours as needed for nausea or vomiting.   pantoprazole 20 MG tablet Commonly known as: PROTONIX Take 1 tablet (20 mg total) by mouth daily.   predniSONE 10 MG tablet Commonly known as: DELTASONE Take 1 tablet (10 mg total) by mouth daily.   sulfamethoxazole-trimethoprim 400-80 MG tablet Commonly known as: BACTRIM Take 1 tablet by mouth 2 (two) times daily for 7 days.   Vitamin D3 1.25 MG (50000 UT) Tabs Take 10,000 Units by mouth daily.   zinc sulfate 220 (50 Zn) MG capsule Take 1 capsule (220 mg total) by mouth daily.            Durable Medical Equipment  (From admission, onward)         Start     Ordered   01/28/20 0000  DME Bedside commode    Question:  Patient needs a bedside commode to treat with the following condition  Answer:  Weakness   01/28/20 [place] [employer] In 1 day.   Specialty: General Practice Contact information: [place] [address] [phone]        [place].   Specialty: Emergency Medicine Why: If symptoms worsen Contact information: [place] [id number] [address] [place] [phone]         No Known Allergies  Consultations:  PCCm   Procedures/Studies: DG Chest 1 View  Result Date: 01/23/2020 CLINICAL DATA:  COVID-19 positivity EXAM: CHEST  1 VIEW COMPARISON:  01/18/2020 FINDINGS: Cardiac shadow is stable. Right upper lobe infiltrate is noted with volume loss. Patchy opacities are noted throughout both lungs consistent with the given clinical history of COVID-19 positivity. Hiatal hernia is noted. No bony abnormality is seen. IMPRESSION: Bilateral patchy opacities consistent with COVID-19 positivity. Electronically Signed   By: Inez Catalina M.D.   On: 01/23/2020 15:20   CT Angio Chest PE W and/or Wo Contrast  Result Date: 01/18/2020 CLINICAL DATA:  Shortness of breath.  EXAM: CT ANGIOGRAPHY CHEST WITH CONTRAST TECHNIQUE: Multidetector CT imaging of the chest was performed using the standard protocol during bolus administration of intravenous contrast. Multiplanar CT image reconstructions and MIPs were obtained to evaluate the vascular anatomy. CONTRAST:  158mL OMNIPAQUE IOHEXOL 350 MG/ML SOLN COMPARISON:  Jan 10, 2020 FINDINGS: Cardiovascular: There is moderate severity calcification of the thoracic aorta. Mild areas of intraluminal low attenuation are seen involving multiple upper lobe and lower lobe branches of the left pulmonary artery. Normal heart size. No pericardial effusion. Moderate severity coronary artery calcification is seen. Mediastinum/Nodes: No enlarged mediastinal, hilar, or axillary lymph nodes. Thyroid gland, trachea, and esophagus demonstrate no significant findings. Lungs/Pleura: Marked severity diffuse patchy and ground-glass appearing infiltrates which is increased in severity when compared to the prior study. There is no evidence of a pleural effusion or pneumothorax. Upper Abdomen: There is a large gastric hernia. A subcentimeter gallstone is seen within the gallbladder lumen. Musculoskeletal: Multilevel degenerative changes seen throughout the thoracic spine. Review of the  MIP images confirms the above findings. IMPRESSION: 1. Marked severity diffuse bilateral patchy and ground-glass appearing infiltrates. 2. Findings consistent with mild, bilateral upper lobe and lower lobe pulmonary emboli. 3. Large gastric hernia. 4. Cholelithiasis. Aortic Atherosclerosis (ICD10-I70.0). Electronically Signed   By: Virgina Norfolk M.D.   On: 01/18/2020 23:53   CT Angio Chest PE W and/or Wo Contrast  Result Date: 01/10/2020 CLINICAL DATA:  Hypoxemia EXAM: CT ANGIOGRAPHY CHEST WITH CONTRAST TECHNIQUE: Multidetector CT imaging of the chest was performed using the standard protocol during bolus administration of intravenous contrast. Multiplanar CT image  reconstructions and MIPs were obtained to evaluate the vascular anatomy. CONTRAST:  21mL OMNIPAQUE IOHEXOL 350 MG/ML SOLN COMPARISON:  None. FINDINGS: Cardiovascular: There is a optimal opacification of the pulmonary arteries. There is no central,segmental, or subsegmental filling defects within the pulmonary arteries. There is mild cardiomegaly. No pericardial effusion or thickening. No evidence right heart strain. There is normal three-vessel brachiocephalic anatomy. The ascending intrathoracic aorta appears to be dilated measuring up to 4.3 cm which tapers at the level of the aortic arch, however contrast is preferentially within the pulmonary arteries. Scattered aortic atherosclerosis is seen. There is also atherosclerosis seen at the origin of the great vessels. Mediastinum/Nodes: No hilar, mediastinal, or axillary adenopathy. Thyroid gland, trachea, and esophagus demonstrate no significant findings. Lungs/Pleura: Ground-glass hazy airspace opacities seen throughout the right lung with patchy airspace opacities seen posteriorly at the right lung base. There is also patchy airspace opacity seen at the posterior left lung base. No pleural effusion is seen. Upper Abdomen: No acute abnormalities present in the visualized portions of the upper abdomen. Musculoskeletal: No chest wall abnormality. No acute or significant osseous findings. Review of the MIP images confirms the above findings. IMPRESSION: 1. No central, segmental, or subsegmental pulmonary embolism. 2. Multifocal patchy ground-glass hazy airspace opacities seen throughout both lungs right greater than left, likely consistent with multifocal pneumonia. 3. 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 Recommend annual imaging followup by CTA or MRA. This recommendation follows 2010 ACCF/AHA/AATS/ACR/ASA/SCA/SCAI/SIR/STS/SVM Guidelines for the Diagnosis and Management of Patients with Thoracic Aortic Disease. Circulation. 2010; [id number]. Aortic  aneurysm NOS (ICD10-I71.9) 4.  Aortic Atherosclerosis (ICD10-I70.0). Electronically Signed   By: Prudencio Pair M.D.   On: 01/10/2020 23:00   DG Chest Port 1 View  Result Date: 01/28/2020 CLINICAL DATA:  COVID-19 pneumonia. EXAM: PORTABLE CHEST 1 VIEW COMPARISON:  Chest x-rays dated 01/23/2020 and 01/18/2020 and chest CT dated 01/18/2020 FINDINGS: Bilateral pulmonary infiltrates persist in slight new atelectasis in the right upper lobe. No new infiltrates. Heart size and vascularity are normal. Large hiatal hernia. No effusions. No acute bone abnormality. IMPRESSION: Persistent bilateral pulmonary infiltrates with slight new atelectasis in the right upper lobe. Electronically Signed   By: Lorriane Shire M.D.   On: 01/28/2020 16:39   DG Chest Portable 1 View  Result Date: 01/18/2020 CLINICAL DATA:  Worsening shortness of breath, COVID positive. EXAM: PORTABLE CHEST 1 VIEW COMPARISON:  None. FINDINGS: Moderate to marked severity infiltrate is seen within the right upper lobe with mild infiltrates seen along the periphery of the left lung and right lung base. There is no evidence of a pleural effusion or pneumothorax. The heart size and mediastinal contours are within normal limits. A small hiatal hernia is seen. The visualized skeletal structures are unremarkable. IMPRESSION: 1. Moderate to marked severity right upper lobe infiltrate. 2. Mild bilateral infiltrates. Electronically Signed   By: Virgina Norfolk M.D.   On: 01/18/2020  21:57       Subjective: Has no complaints. Feels great. No sob  Discharge Exam: Vitals:   01/28/20 1601 01/29/20 0607  BP: (!) 142/86   Pulse: 89   Resp: 16   Temp: (!) 97.5 F (36.4 C) 97.6 F (36.4 C)  SpO2: 91%    Vitals:   01/27/20 2323 01/28/20 0937 01/28/20 1601 01/29/20 0607  BP: (!) 93/53 140/69 (!) 142/86   Pulse: 78 77 89   Resp: 16 17 16    Temp: 97.8 F (36.6 C) 97.9 F (36.6 C) (!) 97.5 F (36.4 C) 97.6 F (36.4 C)  TempSrc: Oral Oral Oral  Oral  SpO2: 96% 96% 91%   Weight:      Height:        General: Pt is alert, awake, not in acute distress Cardiovascular: RRR, S1/S2 +, no rubs, no gallops Respiratory: CTA bilaterally, no wheezing, no rhonchi Abdominal: Soft, NT, ND, bowel sounds + Extremities: no edema, no cyanosis    The results of significant diagnostics from this hospitalization (including imaging, microbiology, ancillary and laboratory) are listed below for reference.     Microbiology: No results found for this or any previous visit (from the past 240 hour(s)).   Labs: BNP (last 3 results) Recent Labs    01/18/20 2138 01/21/20 0654 01/23/20 0548  BNP 91.4 187.9* [id number]*   Basic Metabolic Panel: Recent Labs  Lab 01/23/20 0548 01/24/20 0621 01/26/20 1001 01/27/20 0557  NA 139 138 134* 136  K 4.4 4.9 4.2 4.4  CL 102 101 95* 97*  CO2 29 28 28 28   GLUCOSE 129* 119* 114* 137*  BUN 25* 28* 29* 30*  CREATININE 0.66 0.85 0.89 1.02  CALCIUM 8.5* 8.7* 8.7* 8.8*  MG 2.3 2.2  --   --   PHOS 3.9  --   --   --    Liver Function Tests: Recent Labs  Lab 01/23/20 0548  AST 67*  ALT 122*  ALKPHOS 69  BILITOT 0.9  PROT 5.8*  ALBUMIN 2.6*   No results for input(s): LIPASE, AMYLASE in the last 168 hours. No results for input(s): AMMONIA in the last 168 hours. CBC: Recent Labs  Lab 01/23/20 0548 01/24/20 0621 01/26/20 1001 01/29/20 0608  WBC 10.9* 15.0* 10.3 7.1  NEUTROABS 9.5* 12.9*  --   --   HGB 11.9* 13.0 12.9* 11.9*  HCT 34.5* 38.7* 38.6* 35.4*  MCV 86.5 88.8 89.6 88.9  PLT 310 311 287 243   Cardiac Enzymes: No results for input(s): CKTOTAL, CKMB, CKMBINDEX, TROPONINI in the last 168 hours. BNP: Invalid input(s): POCBNP CBG: No results for input(s): GLUCAP in the last 168 hours. D-Dimer No results for input(s): DDIMER in the last 72 hours. Hgb A1c No results for input(s): HGBA1C in the last 72 hours. Lipid Profile No results for input(s): CHOL, HDL, LDLCALC, TRIG, CHOLHDL,  LDLDIRECT in the last 72 hours. Thyroid function studies No results for input(s): TSH, T4TOTAL, T3FREE, THYROIDAB in the last 72 hours.  Invalid input(s): FREET3 Anemia work up No results for input(s): VITAMINB12, FOLATE, FERRITIN, TIBC, IRON, RETICCTPCT in the last 72 hours. Urinalysis No results found for: COLORURINE, APPEARANCEUR, LABSPEC, [place], GLUCOSEU, HGBUR, BILIRUBINUR, KETONESUR, PROTEINUR, UROBILINOGEN, NITRITE, LEUKOCYTESUR Sepsis Labs Invalid input(s): PROCALCITONIN,  WBC,  LACTICIDVEN Microbiology No results found for this or any previous visit (from the past 240 hour(s)).   Time coordinating discharge: Over 30 minutes  SIGNED:   Nolberto Hanlon, MD  [facility] 01/29/2020, 12:25 PM Pager  If 7PM-7AM, please contact night-coverage [email or web address] Password [email or web address]

## 2020-01-29 NOTE — Plan of Care (Signed)

## 2020-01-29 NOTE — Progress Notes (Signed)
Patient discharged home. Transported home by son, in [place] with personal home O2 tank. After Visit Summary reviewed with patient at bedside. Patient had no questions.

## 2021-03-30 ENCOUNTER — Inpatient Hospital Stay (HOSPITAL_COMMUNITY): Payer: No Typology Code available for payment source

## 2021-03-30 ENCOUNTER — Emergency Department: Payer: No Typology Code available for payment source

## 2021-03-30 ENCOUNTER — Inpatient Hospital Stay (HOSPITAL_COMMUNITY)
Admission: AD | Admit: 2021-03-30 | Discharge: 2021-04-04 | DRG: 064 | Disposition: A | Discharge status: Rehabilitation Facility | Payer: No Typology Code available for payment source | Source: Other Acute Inpatient Hospital | Attending: Neurology | Admitting: Neurology

## 2021-03-30 ENCOUNTER — Emergency Department
Admission: EM | Admit: 2021-03-30 | Discharge: 2021-03-30 | Disposition: A | Discharge status: Other Acute Inpatient Hospital | Payer: No Typology Code available for payment source | Attending: Emergency Medicine | Admitting: Emergency Medicine

## 2021-03-30 ENCOUNTER — Other Ambulatory Visit: Payer: Self-pay

## 2021-03-30 DIAGNOSIS — E785 Hyperlipidemia, unspecified: Secondary | ICD-10-CM | POA: Diagnosis present

## 2021-03-30 DIAGNOSIS — I2510 Atherosclerotic heart disease of native coronary artery without angina pectoris: Secondary | ICD-10-CM | POA: Diagnosis present

## 2021-03-30 DIAGNOSIS — E871 Hypo-osmolality and hyponatremia: Secondary | ICD-10-CM | POA: Diagnosis not present

## 2021-03-30 DIAGNOSIS — R1312 Dysphagia, oropharyngeal phase: Secondary | ICD-10-CM | POA: Diagnosis present

## 2021-03-30 DIAGNOSIS — Z9282 Status post administration of tPA (rtPA) in a different facility within the last 24 hours prior to admission to current facility: Secondary | ICD-10-CM

## 2021-03-30 DIAGNOSIS — G441 Vascular headache, not elsewhere classified: Secondary | ICD-10-CM | POA: Diagnosis not present

## 2021-03-30 DIAGNOSIS — Z923 Personal history of irradiation: Secondary | ICD-10-CM

## 2021-03-30 DIAGNOSIS — I6310 Cerebral infarction due to embolism of unspecified precerebral artery: Secondary | ICD-10-CM | POA: Diagnosis not present

## 2021-03-30 DIAGNOSIS — Z86711 Personal history of pulmonary embolism: Secondary | ICD-10-CM

## 2021-03-30 DIAGNOSIS — I611 Nontraumatic intracerebral hemorrhage in hemisphere, cortical: Secondary | ICD-10-CM | POA: Diagnosis present

## 2021-03-30 DIAGNOSIS — Z7982 Long term (current) use of aspirin: Secondary | ICD-10-CM | POA: Insufficient documentation

## 2021-03-30 DIAGNOSIS — R29706 NIHSS score 6: Secondary | ICD-10-CM | POA: Diagnosis present

## 2021-03-30 DIAGNOSIS — I480 Paroxysmal atrial fibrillation: Secondary | ICD-10-CM | POA: Diagnosis not present

## 2021-03-30 DIAGNOSIS — R29810 Facial weakness: Secondary | ICD-10-CM | POA: Diagnosis present

## 2021-03-30 DIAGNOSIS — Z7901 Long term (current) use of anticoagulants: Secondary | ICD-10-CM | POA: Insufficient documentation

## 2021-03-30 DIAGNOSIS — I10 Essential (primary) hypertension: Secondary | ICD-10-CM | POA: Diagnosis present

## 2021-03-30 DIAGNOSIS — R414 Neurologic neglect syndrome: Secondary | ICD-10-CM | POA: Diagnosis present

## 2021-03-30 DIAGNOSIS — I6340 Cerebral infarction due to embolism of unspecified cerebral artery: Secondary | ICD-10-CM | POA: Insufficient documentation

## 2021-03-30 DIAGNOSIS — T45615A Adverse effect of thrombolytic drugs, initial encounter: Secondary | ICD-10-CM | POA: Diagnosis present

## 2021-03-30 DIAGNOSIS — I63411 Cerebral infarction due to embolism of right middle cerebral artery: Secondary | ICD-10-CM | POA: Diagnosis present

## 2021-03-30 DIAGNOSIS — I4891 Unspecified atrial fibrillation: Secondary | ICD-10-CM | POA: Diagnosis present

## 2021-03-30 DIAGNOSIS — I63431 Cerebral infarction due to embolism of right posterior cerebral artery: Secondary | ICD-10-CM | POA: Diagnosis not present

## 2021-03-30 DIAGNOSIS — Z8616 Personal history of COVID-19: Secondary | ICD-10-CM | POA: Diagnosis not present

## 2021-03-30 DIAGNOSIS — R509 Fever, unspecified: Secondary | ICD-10-CM | POA: Diagnosis not present

## 2021-03-30 DIAGNOSIS — Z20822 Contact with and (suspected) exposure to covid-19: Secondary | ICD-10-CM | POA: Insufficient documentation

## 2021-03-30 DIAGNOSIS — Z87891 Personal history of nicotine dependence: Secondary | ICD-10-CM

## 2021-03-30 DIAGNOSIS — I639 Cerebral infarction, unspecified: Secondary | ICD-10-CM | POA: Diagnosis present

## 2021-03-30 DIAGNOSIS — R4781 Slurred speech: Secondary | ICD-10-CM | POA: Diagnosis present

## 2021-03-30 DIAGNOSIS — F482 Pseudobulbar affect: Secondary | ICD-10-CM | POA: Diagnosis present

## 2021-03-30 DIAGNOSIS — R471 Dysarthria and anarthria: Secondary | ICD-10-CM | POA: Diagnosis present

## 2021-03-30 DIAGNOSIS — G8929 Other chronic pain: Secondary | ICD-10-CM | POA: Diagnosis present

## 2021-03-30 DIAGNOSIS — I6389 Other cerebral infarction: Secondary | ICD-10-CM | POA: Diagnosis not present

## 2021-03-30 DIAGNOSIS — I712 Thoracic aortic aneurysm, without rupture: Secondary | ICD-10-CM | POA: Diagnosis present

## 2021-03-30 DIAGNOSIS — Z85819 Personal history of malignant neoplasm of unspecified site of lip, oral cavity, and pharynx: Secondary | ICD-10-CM

## 2021-03-30 DIAGNOSIS — H53462 Homonymous bilateral field defects, left side: Secondary | ICD-10-CM | POA: Diagnosis present

## 2021-03-30 DIAGNOSIS — G8194 Hemiplegia, unspecified affecting left nondominant side: Secondary | ICD-10-CM | POA: Diagnosis present

## 2021-03-30 DIAGNOSIS — R531 Weakness: Secondary | ICD-10-CM | POA: Insufficient documentation

## 2021-03-30 DIAGNOSIS — Z9221 Personal history of antineoplastic chemotherapy: Secondary | ICD-10-CM | POA: Diagnosis not present

## 2021-03-30 DIAGNOSIS — I351 Nonrheumatic aortic (valve) insufficiency: Secondary | ICD-10-CM | POA: Diagnosis not present

## 2021-03-30 DIAGNOSIS — I609 Nontraumatic subarachnoid hemorrhage, unspecified: Secondary | ICD-10-CM

## 2021-03-30 DIAGNOSIS — I63511 Cerebral infarction due to unspecified occlusion or stenosis of right middle cerebral artery: Secondary | ICD-10-CM | POA: Diagnosis not present

## 2021-03-30 DIAGNOSIS — I951 Orthostatic hypotension: Secondary | ICD-10-CM | POA: Diagnosis not present

## 2021-03-30 DIAGNOSIS — I8000 Phlebitis and thrombophlebitis of superficial vessels of unspecified lower extremity: Secondary | ICD-10-CM | POA: Diagnosis not present

## 2021-03-30 DIAGNOSIS — I69391 Dysphagia following cerebral infarction: Secondary | ICD-10-CM | POA: Diagnosis not present

## 2021-03-30 DIAGNOSIS — E782 Mixed hyperlipidemia: Secondary | ICD-10-CM | POA: Diagnosis not present

## 2021-03-30 HISTORY — DX: Dysphagia, unspecified: R13.10

## 2021-03-30 HISTORY — DX: Other pulmonary embolism without acute cor pulmonale: I26.99

## 2021-03-30 HISTORY — DX: Post covid-19 condition, unspecified: U09.9

## 2021-03-30 HISTORY — DX: Malignant neoplasm of pharynx, unspecified: C14.0

## 2021-03-30 LAB — COMPREHENSIVE METABOLIC PANEL
ALT: 20 U/L (ref 0–44)
AST: 29 U/L (ref 15–41)
Albumin: 4.2 g/dL (ref 3.5–5.0)
Alkaline Phosphatase: 63 U/L (ref 38–126)
Anion gap: 5 (ref 5–15)
BUN: 20 mg/dL (ref 8–23)
CO2: 25 mmol/L (ref 22–32)
Calcium: 9.1 mg/dL (ref 8.9–10.3)
Chloride: 108 mmol/L (ref 98–111)
Creatinine, Ser: 1.03 mg/dL (ref 0.61–1.24)
GFR, Estimated: >60 mL/min (ref >60)
Glucose, Bld: 112 mg/dL — ABNORMAL HIGH (ref 70–99)
Potassium: 4.4 mmol/L (ref 3.5–5.1)
Sodium: 138 mmol/L (ref 135–145)
Total Bilirubin: 1.1 mg/dL (ref 0.3–1.2)
Total Protein: 7.5 g/dL (ref 6.5–8.1)

## 2021-03-30 LAB — RESP PANEL BY RT-PCR (FLU A&B, COVID) ARPGX2
Influenza A by PCR: NEGATIVE
Influenza B by PCR: NEGATIVE
SARS Coronavirus 2 by RT PCR: NEGATIVE

## 2021-03-30 LAB — CBC
HCT: 39.8 % (ref 39.0–52.0)
HCT: 39.3 % (ref 39.0–52.0)
HCT: 41.1 % (ref 39.0–52.0)
Hemoglobin: 13.5 g/dL (ref 13.0–17.0)
Hemoglobin: 13.3 g/dL (ref 13.0–17.0)
Hemoglobin: 13.6 g/dL (ref 13.0–17.0)
MCH: 28.7 pg (ref 26.0–34.0)
MCH: 28.8 pg (ref 26.0–34.0)
MCH: 27.9 pg (ref 26.0–34.0)
MCHC: 33.9 g/dL (ref 30.0–36.0)
MCHC: 33.8 g/dL (ref 30.0–36.0)
MCHC: 33.1 g/dL (ref 30.0–36.0)
MCV: 84.7 fL (ref 80.0–100.0)
MCV: 85.1 fL (ref 80.0–100.0)
MCV: 84.4 fL (ref 80.0–100.0)
Platelets: 182 10*3/uL (ref 150–400)
Platelets: 182 10*3/uL (ref 150–400)
Platelets: 196 10*3/uL (ref 150–400)
RBC: 4.7 MIL/uL (ref 4.22–5.81)
RBC: 4.62 MIL/uL (ref 4.22–5.81)
RBC: 4.87 MIL/uL (ref 4.22–5.81)
RDW: 16.1 % — ABNORMAL HIGH (ref 11.5–15.5)
RDW: 16.2 % — ABNORMAL HIGH (ref 11.5–15.5)
RDW: 16.1 % — ABNORMAL HIGH (ref 11.5–15.5)
WBC: 5.3 10*3/uL (ref 4.0–10.5)
WBC: 7.2 10*3/uL (ref 4.0–10.5)
WBC: 7 10*3/uL (ref 4.0–10.5)
nRBC: 0 % (ref 0.0–0.2)
nRBC: 0 % (ref 0.0–0.2)
nRBC: 0 % (ref 0.0–0.2)

## 2021-03-30 LAB — TYPE AND SCREEN
ABO/RH(D): O POS
ABO/RH(D): O POS
Antibody Screen: NEGATIVE
Antibody Screen: NEGATIVE

## 2021-03-30 LAB — TROPONIN I (HIGH SENSITIVITY): Troponin I (High Sensitivity): 12 ng/L (ref <18)

## 2021-03-30 LAB — LIPID PANEL
Cholesterol: 269 mg/dL — ABNORMAL HIGH (ref 0–200)
HDL: 67 mg/dL (ref >40)
LDL Cholesterol: 184 mg/dL — ABNORMAL HIGH (ref 0–99)
Total CHOL/HDL Ratio: 4 RATIO
Triglycerides: 89 mg/dL (ref <150)
VLDL: 18 mg/dL (ref 0–40)

## 2021-03-30 LAB — DIFFERENTIAL
Abs Immature Granulocytes: 0.01 10*3/uL (ref 0.00–0.07)
Basophils Absolute: 0.1 10*3/uL (ref 0.0–0.1)
Basophils Relative: 1 %
Eosinophils Absolute: 0.1 10*3/uL (ref 0.0–0.5)
Eosinophils Relative: 2 %
Immature Granulocytes: 0 %
Lymphocytes Relative: 27 %
Lymphs Abs: 1.4 10*3/uL (ref 0.7–4.0)
Monocytes Absolute: 0.5 10*3/uL (ref 0.1–1.0)
Monocytes Relative: 10 %
Neutro Abs: 3.2 10*3/uL (ref 1.7–7.7)
Neutrophils Relative %: 60 %

## 2021-03-30 LAB — MRSA NEXT GEN BY PCR, NASAL: MRSA by PCR Next Gen: NOT DETECTED

## 2021-03-30 LAB — PROTIME-INR
INR: 1 (ref 0.8–1.2)
INR: 1.6 — ABNORMAL HIGH (ref 0.8–1.2)
Prothrombin Time: 13.5 seconds (ref 11.4–15.2)
Prothrombin Time: 18.8 seconds — ABNORMAL HIGH (ref 11.4–15.2)

## 2021-03-30 LAB — APTT
aPTT: 29 seconds (ref 24–36)
aPTT: 40 seconds — ABNORMAL HIGH (ref 24–36)

## 2021-03-30 LAB — HEMOGLOBIN A1C
Hgb A1c MFr Bld: 5.8 % — ABNORMAL HIGH (ref 4.8–5.6)
Mean Plasma Glucose: 119.76 mg/dL

## 2021-03-30 LAB — FIBRINOGEN
Fibrinogen: 66 mg/dL — CL (ref 210–475)
Fibrinogen: 73 mg/dL — CL (ref 210–475)
Fibrinogen: 66 mg/dL — CL (ref 210–475)

## 2021-03-30 LAB — CBG MONITORING, ED: Glucose-Capillary: 115 mg/dL — ABNORMAL HIGH (ref 70–99)

## 2021-03-30 MED ORDER — LABETALOL HCL 5 MG/ML IV SOLN
10.0000 mg | INTRAVENOUS | Status: DC | PRN
Start: 2021-03-30 — End: 2021-03-30
  Administered 2021-03-30: 10 mg via INTRAVENOUS

## 2021-03-30 MED ORDER — SODIUM CHLORIDE 0.9 % IV SOLN: INTRAVENOUS | Status: DC

## 2021-03-30 MED ORDER — SODIUM CHLORIDE 0.9 % IV BOLUS: 500.0000 mL | Freq: Once | INTRAVENOUS | Status: DC

## 2021-03-30 MED ORDER — ONDANSETRON HCL 4 MG/2ML IJ SOLN
4.0000 mg | Freq: Four times a day (QID) | INTRAMUSCULAR | Status: DC
  Administered 2021-03-30 (×2): 4 mg via INTRAVENOUS
  Filled 2021-03-30 (×2): qty 2

## 2021-03-30 MED ORDER — ACETAMINOPHEN 325 MG PO TABS
650.0000 mg | ORAL_TABLET | ORAL | Status: DC | PRN
  Administered 2021-03-31 – 2021-04-04 (×10): 650 mg via ORAL
  Filled 2021-03-30 (×11): qty 2

## 2021-03-30 MED ORDER — SODIUM CHLORIDE 0.9 % IV SOLN: Freq: Once | INTRAVENOUS | Status: DC

## 2021-03-30 MED ORDER — STROKE: EARLY STAGES OF RECOVERY BOOK: Freq: Once | Status: AC

## 2021-03-30 MED ORDER — LABETALOL HCL 5 MG/ML IV SOLN
INTRAVENOUS | Status: AC
  Filled 2021-03-30: qty 4

## 2021-03-30 MED ORDER — CLEVIDIPINE BUTYRATE 0.5 MG/ML IV EMUL
0.0000 mg/h | INTRAVENOUS | Status: DC
  Administered 2021-03-30: 8 mg/h via INTRAVENOUS
  Administered 2021-03-31: 11 mg/h via INTRAVENOUS
  Administered 2021-03-31: 1 mg/h via INTRAVENOUS
  Administered 2021-03-31: 8 mg/h via INTRAVENOUS
  Filled 2021-03-30 (×2): qty 50
  Filled 2021-03-30: qty 100
  Filled 2021-03-30: qty 50
  Filled 2021-03-30: qty 100

## 2021-03-30 MED ORDER — SENNOSIDES-DOCUSATE SODIUM 8.6-50 MG PO TABS: 1.0000 | ORAL_TABLET | Freq: Every evening | ORAL | Status: DC | PRN

## 2021-03-30 MED ORDER — SODIUM CHLORIDE 0.9 % IV SOLN
50.0000 mL | Freq: Once | INTRAVENOUS | Status: AC
  Administered 2021-03-30: 50 mL via INTRAVENOUS

## 2021-03-30 MED ORDER — ACETAMINOPHEN 650 MG RE SUPP
650.0000 mg | RECTAL | Status: DC | PRN
  Filled 2021-03-30: qty 1

## 2021-03-30 MED ORDER — PANTOPRAZOLE SODIUM 40 MG IV SOLR
40.0000 mg | Freq: Every day | INTRAVENOUS | Status: DC
  Administered 2021-03-30 – 2021-04-02 (×4): 40 mg via INTRAVENOUS
  Filled 2021-03-30 (×3): qty 40

## 2021-03-30 MED ORDER — IOHEXOL 350 MG/ML SOLN
75.0000 mL | Freq: Once | INTRAVENOUS | Status: AC | PRN
  Administered 2021-03-30: 75 mL via INTRAVENOUS

## 2021-03-30 MED ORDER — MELATONIN 5 MG PO TABS
5.0000 mg | ORAL_TABLET | Freq: Every evening | ORAL | Status: DC | PRN
  Administered 2021-04-03: 5 mg via ORAL
  Filled 2021-03-30: qty 1

## 2021-03-30 MED ORDER — ACETAMINOPHEN 160 MG/5ML PO SOLN: 650.0000 mg | ORAL | Status: DC | PRN

## 2021-03-30 MED ORDER — ALTEPLASE 100 MG IV SOLR
INTRAVENOUS | Status: AC
  Administered 2021-03-30: 90 mg via INTRAVENOUS
  Filled 2021-03-30: qty 100

## 2021-03-30 MED ORDER — IOHEXOL 350 MG/ML SOLN
100.0000 mL | Freq: Once | INTRAVENOUS | Status: AC | PRN
  Administered 2021-03-30: 100 mL via INTRAVENOUS

## 2021-03-30 MED ORDER — CLEVIDIPINE BUTYRATE 0.5 MG/ML IV EMUL
INTRAVENOUS | Status: AC
  Administered 2021-03-30: 1 mg/h via INTRAVENOUS
  Filled 2021-03-30: qty 50

## 2021-03-30 MED ORDER — TRANEXAMIC ACID-NACL 1000-0.7 MG/100ML-% IV SOLN
1000.0000 mg | Freq: Once | INTRAVENOUS | Status: AC
  Administered 2021-03-30: 1000 mg via INTRAVENOUS
  Filled 2021-03-30: qty 100

## 2021-03-30 MED ORDER — SODIUM CHLORIDE 0.9 % IV SOLN: INTRAVENOUS | Status: AC

## 2021-03-30 MED ORDER — SODIUM CHLORIDE 0.9% FLUSH: 3.0000 mL | Freq: Once | INTRAVENOUS | Status: DC

## 2021-03-30 MED ORDER — PHENYLEPHRINE HCL-NACL 10-0.9 MG/250ML-% IV SOLN
0.0000 ug/min | INTRAVENOUS | Status: DC
  Filled 2021-03-30: qty 250

## 2021-03-30 MED ORDER — LISINOPRIL 10 MG PO TABS
10.0000 mg | ORAL_TABLET | Freq: Every day | ORAL | Status: DC
  Administered 2021-03-31 – 2021-04-04 (×5): 10 mg via ORAL
  Filled 2021-03-30 (×5): qty 1

## 2021-03-30 MED ORDER — CHLORHEXIDINE GLUCONATE CLOTH 2 % EX PADS
6.0000 | MEDICATED_PAD | Freq: Every day | CUTANEOUS | Status: DC
  Administered 2021-03-31 – 2021-04-03 (×2): 6 via TOPICAL

## 2021-03-30 MED ORDER — ALTEPLASE (STROKE) FULL DOSE INFUSION: 0.9000 mg/kg | Freq: Once | INTRAVENOUS | Status: AC

## 2021-03-30 MED ORDER — CLEVIDIPINE BUTYRATE 0.5 MG/ML IV EMUL
0.0000 mg/h | INTRAVENOUS | Status: DC
  Administered 2021-03-30: 3.75 mg/h via INTRAVENOUS
  Filled 2021-03-30: qty 50

## 2021-03-30 NOTE — Progress Notes (Signed)
   03/30/21 [id number]  Clinical Encounter Type  Visited With Patient and family together  Visit Type Initial  Referral From Nurse  Consult/Referral To [facility] responded to a code stroke in ED-10 Pt, Mr. Geoffrey Wells. I arrived in the ED and engaged with [name], the wife of the pt. Pt was being examined by the medical team and his wife [name] was standing in the doorway of the room. I provided reflective listening, words of encouragement and prayer. [name] thanked me for coming and provided spiritual support, and I informed her to feel free to contact our spiritual care department if she need further support.

## 2021-03-30 NOTE — ED Provider Notes (Signed)
[facility] Emergency Department Provider Note  ____________________________________________   Event Date/Time   First MD Initiated Contact with Patient 03/30/21 0848     (approximate)  I have reviewed the triage vital signs and the nursing notes.   HISTORY  Chief Complaint No chief complaint on file.    HPI Geoffrey Wells is a 75 y.o. male  here with slurred speech and facial asymmetry.  The patient states that he was in his usual state of health this morning.  He taught a class on theology.  He was driving home when he experienced acute onset of left hand numbness, weakness, and slurred speech.  He felt like his face was drooping and his tongue was heavy.  His symptoms have persisted since then.  This occurred at around 7:30 AM.  As mentioned, he ptotic course and was well prior to this.  He denies any recent trauma.  No recent bleeding.  No fevers or chills.  No headache.  No right-sided symptoms.  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  No known history of strokes.  He is on Eliquis.       Past Medical History:  Diagnosis Date   Pneumonia due to COVID-19 virus 01/10/2020    Patient Active Problem List   Diagnosis Date Noted   Bilateral pulmonary embolism ([place]) 01/19/2020   Acute hypoxemic respiratory failure due to COVID-19 [facility]) 01/18/2020   Generalized weakness    COVID-19 01/10/2020    Past Surgical History:  Procedure Laterality Date   HERNIA REPAIR      Prior to Admission medications   Medication Sig Start Date End Date Taking? Authorizing Provider  ascorbic acid (VITAMIN C) 500 MG tablet Take 1 tablet (500 mg total) by mouth daily. 01/29/20  Yes Nolberto Hanlon, MD  aspirin 81 MG chewable tablet Chew 81 mg by mouth every evening.   Yes [provider]  Cholecalciferol (VITAMIN D3) 250 MCG (10000 UT) TABS Take 10,000 Units by mouth daily.   Yes [provider]  lisinopril ([name]) 20 MG tablet Take 10 mg by mouth daily.   Yes [provider]  loratadine (CLARITIN) 10 MG tablet Take 10 mg by mouth daily as needed for allergies.   Yes [provider]  melatonin 5 MG TABS Take 5 mg by mouth at bedtime as needed (sleep).   Yes [provider]  Multiple Vitamins-Minerals (MULTIVITAMIN WITH MINERALS) tablet Take 1 tablet by mouth daily.   Yes [provider]  vitamin B-12 (CYANOCOBALAMIN) 1000 MCG tablet Take 1,000 mcg by mouth daily.   Yes [provider]  zinc sulfate 220 (50 Zn) MG capsule Take 1 capsule (220 mg total) by mouth daily. 01/13/20  Yes Max Sane, MD    Allergies Patient has no known allergies.  No family history on file.  Social History Social History   Tobacco Use   Smoking status: Former    Packs/day: 0.50    Years: 25.00    Pack years: 12.50    Types: Cigarettes   Smokeless tobacco: Never  Substance Use Topics   Alcohol use: Yes    Alcohol/week: 7.0 standard drinks    Types: 7 Cans of beer per week   Drug use: Not Currently    Review of Systems  Review of Systems  Constitutional:  Negative for chills and fever.  HENT:  Negative for sore throat.  Respiratory:  Negative for shortness of breath.   Cardiovascular:  Negative for chest pain.  Gastrointestinal:  Negative for abdominal pain.  Genitourinary:  Negative for flank pain.  Musculoskeletal:  Negative for neck pain.  Skin:  Negative for rash and wound.  Allergic/Immunologic: Negative for immunocompromised state.  Neurological:  Positive for facial asymmetry, speech difficulty and weakness. Negative for numbness.  Hematological:  Does not bruise/bleed easily.  All other systems reviewed and are negative.   ____________________________________________  PHYSICAL EXAM:      VITAL SIGNS: ED Triage Vitals  Enc Vitals Group     BP      Pulse      Resp      Temp      Temp src      SpO2       Weight      Height      Head Circumference      Peak Flow      Pain Score      Pain Loc      Pain Edu?      Excl. in [place]?      Physical Exam Vitals and nursing note reviewed.  Constitutional:      General: He is not in acute distress.    Appearance: He is well-developed.  HENT:     Head: Normocephalic and atraumatic.  Eyes:     Conjunctiva/sclera: Conjunctivae normal.  Cardiovascular:     Rate and Rhythm: Normal rate and regular rhythm.     Heart sounds: Normal heart sounds.  Pulmonary:     Effort: Pulmonary effort is normal. No respiratory distress.     Breath sounds: No wheezing.  Abdominal:     General: There is no distension.  Musculoskeletal:     Cervical back: Neck supple.  Skin:    General: Skin is warm.     Capillary Refill: Capillary refill takes less than 2 seconds.     Findings: No rash.  Neurological:     Mental Status: He is alert and oriented to person, place, and time.     Motor: No abnormal muscle tone.     Comments: Neurological Exam:  Mental Status: Alert and oriented to person, place, and time. Attention and concentration normal. Speech slightly slurred. Recent memory is intact. Cranial Nerves: Visual fields grossly intact. EOMI and PERRLA. No nystagmus noted. Facial sensation intact at forehead, maxillary cheek, and chin/mandible bilaterally. Left facial droop noted Hearing grossly normal. Uvula is midline, and palate elevates symmetrically. Normal SCM and trapezius strength. Tongue midline without fasciculations. Motor: Muscle strength 5/5 in proximal and distal UE and LE bilaterally thought subjectively weak LUE. No pronator drift. Muscle tone normal. Sensation: Intact to light touch in upper and lower extremities distally bilaterally. Coordination: Normal FTN bilaterally.         ____________________________________________   LABS (all labs ordered are listed, but only abnormal results are displayed)  Labs Reviewed  CBC - Abnormal; Notable  for the following components:      Result Value   RDW 16.1 (*)    All other components within normal limits  COMPREHENSIVE METABOLIC PANEL - Abnormal; Notable for the following components:   Glucose, Bld 112 (*)    All other components within normal limits  FIBRINOGEN - Abnormal; Notable for the following components:   Fibrinogen 66 (*)    All other components within normal limits  CBG MONITORING, ED - Abnormal; Notable for the following components:   Glucose-Capillary 115 (*)  All other components within normal limits  RESP PANEL BY RT-PCR (FLU A&B, COVID) ARPGX2  PROTIME-INR  APTT  DIFFERENTIAL  I-STAT CREATININE, ED  TROPONIN I (HIGH SENSITIVITY)    ____________________________________________  EKG: Normal sinus rhythm, ventricular rate 61.  PR 149, QRS 117, QTc 471.  LVH.  Borderline T wave abnormalities.  No acute ST elevations or depressions. ________________________________________  RADIOLOGY All imaging, including plain films, CT scans, and ultrasounds, independently reviewed by me, and interpretations confirmed via formal radiology reads.  ED MD interpretation:   CT head code stroke: No acute intracranial abnormality CT angio: No acute intracranial hemorrhage, small right vertebral artery with suspected high-grade stenosis or occlusion  Official radiology report(s): CT HEAD WO CONTRAST  Result Date: 03/30/2021 CLINICAL DATA:  Neuro deficit, acute, stroke suspected; mental status change after receiving tPA EXAM: CT HEAD WITHOUT CONTRAST CT ANGIOGRAPHY OF THE HEAD AND NECK TECHNIQUE: Contiguous axial images were obtained from the base of the skull through the vertex without intravenous contrast. Multidetector CT imaging of the head and neck was performed using the standard protocol during bolus administration of intravenous contrast. Multiplanar CT image reconstructions and MIPs were obtained to evaluate the vascular anatomy. Carotid stenosis measurements (when  applicable) are obtained utilizing NASCET criteria, using the distal internal carotid diameter as the denominator. CONTRAST:  168m OMNIPAQUE IOHEXOL 350 MG/ML SOLN COMPARISON:  None. FINDINGS: CT HEAD Brain: Small volume sulcal subarachnoid hemorrhage is again identified in the right temporo-occipital region. There is no new loss of gray-white differentiation. Punctate foci of calcification are identified within the right posterior temporal region, right posterior sylvian fissure, right occipital lobe, and right perimesencephalic cistern. Stable findings of chronic microvascular ischemic changes. Ventricle size is stable. Vascular: Residual contrast from prior CTA. Skull: No new finding. Sinuses/Orbits: No acute finding. Other: None. Review of the MIP images confirms the above findings CTA NECK Aortic arch: Mild aneurysmal dilatation of the ascending thoracic aorta similar to 2021 chest CT. Great vessel origins remain patent. Right carotid system: Remains patent. Plaque along the proximal internal carotid causes mild stenosis. Left carotid system: Remains patent. Calcified plaque along the proximal internal carotid causes mild stenosis. Vertebral arteries: Remain patent. Congenitally small right vertebral artery. Skeleton: No new finding. Other neck: No new finding. Upper chest: No new finding. Review of the MIP images confirms the above findings CTA HEAD Anterior circulation: Intracranial internal carotid arteries remain patent. Anterior cerebral arteries are patent. Right M1 and M2 MCA branches are patent. There is a new focus of calcification along a right M3 MCA branch within the sylvian fissure. However, the vessel appears patent beyond this point. Left middle cerebral artery is patent. Posterior circulation: As on the prior study, there is poor visualization of the intracranial right vertebral artery. Left vertebral remains patent with calcified plaque causing mild stenosis. Basilar artery is patent. Proximal  right posterior cerebral artery is patent, primarily supplied by a posterior communicating artery. There is new occlusion of a P3 PCA branch. Venous sinuses: Patent as allowed by contrast bolus timing. Review of the MIP images confirms the above findings IMPRESSION: Similar small volume sulcal subarachnoid hemorrhage post tPA. Punctate foci of calcification in the right posterior temporal region, right posterior sylvian fissure, right occipital lobe, and right perimesencephalic cistern. Some of these are new since the initial study earlier today reflecting calcific emboli. The right sylvian calcification is along a right M3 MCA branch. The vessel appears patent beyond this point. Right perimesencephalic calcification is along a right P3  PCA branch, which is newly occluded since the earlier CTA. As before, there is poor visualization of intracranial right vertebral artery reflecting high-grade stenosis or occlusion. These results were called by telephone at the time of interpretation on 03/30/2021 at time of study completion and again at 12:39 pm to provider [facility] , who verbally acknowledged these results. Electronically Signed   By: Macy Mis M.D.   On: 03/30/2021 12:49   CT HEAD WO CONTRAST  Addendum Date: 03/30/2021   ADDENDUM REPORT: 03/30/2021 11:49 ADDENDUM: Study discussed by telephone with Dr. Lesleigh Noe on 03/30/2021 at 1116 hours. And also I discussed this scan again with Dr. Erlinda Hong at 1145 hours. He notes a new vessel related density in the right sylvian fissure from the two prior noncontrast Head CTs today - see series 5, image 17. And this appears to correspond to the level of a Right MCA posterior M3 branch when correlated with the earlier sagittal thin CTA images. He advises that the patient continues to have progression of posterior circulation type symptoms. And they may repeat a CTA Head and Neck. If so attention can also be directed to this particular vessel. Electronically Signed    By: Genevie Ann M.D.   On: 03/30/2021 11:49   Result Date: 03/30/2021 CLINICAL DATA:  75 year old male code stroke presentation this morning with worsening NIH scale status post IV tPA. EXAM: CT HEAD WITHOUT CONTRAST TECHNIQUE: Contiguous axial images were obtained from the base of the skull through the vertex without intravenous contrast. COMPARISON:  Plain head CT 0835 hours today. FINDINGS: Brain: Residual intravascular contrast. There is a trace amount of new subarachnoid density along the junction of the posterior right temporal and occipital lobes compatible with trace subarachnoid hemorrhage. See series 5, image 15 and series 4, image 53. This tracks anteriorly toward the inferior right temporal gyrus. No regional mass effect. No regional cytotoxic edema identified. No other acute intracranial hemorrhage identified. No IVH. No cortically based acute infarct identified. No midline shift, mass effect, or evidence of intracranial mass lesion. No ventriculomegaly. Gray-white matter differentiation appears stable since 0835 hours, patchy white matter hypodensity. Vascular: Calcified atherosclerosis at the skull base. The major intracranial vascular structures appear to remain enhancing. Skull: Negative. Sinuses/Orbits: Visualized paranasal sinuses and mastoids are stable and well aerated. Other: Visualized orbits and scalp soft tissues are within normal limits. IMPRESSION: 1. Positive for trace new subarachnoid hemorrhage along the inferior right hemisphere at the junction of the posterior temporal and occipital lobes. No associated mass effect. 2. But no acute cortically based infarct identified and otherwise stable since 0835 hours today. 3. These results were communicated to Dr. Curly Shores at 10:57 am on 03/30/2021 by text page via the [facility] messaging system. Electronically Signed: By: Genevie Ann M.D. On: 03/30/2021 10:57   CT HEAD CODE STROKE WO CONTRAST  Result Date: 03/30/2021 CLINICAL DATA:  Code stroke.  Neuro  deficit, acute, stroke suspected EXAM: CT HEAD WITHOUT CONTRAST TECHNIQUE: Contiguous axial images were obtained from the base of the skull through the vertex without intravenous contrast. COMPARISON:  None. FINDINGS: Brain: There is no acute intracranial hemorrhage, mass effect, or edema. Gray-white differentiation is preserved. Patchy and confluent areas of low-attenuation in the supratentorial white matter are nonspecific probably reflect moderate chronic microvascular ischemic changes. Prominence of the ventricles and sulci reflects minor generalized parenchymal volume loss. No extra-axial collection. Vascular: No hyperdense vessel. There is intracranial atherosclerotic calcification at the skull base. Skull: Unremarkable. Sinuses/Orbits: No acute abnormality. Other:  Mastoid air cells are clear. ASPECTS ([place] Stroke Program Early CT Score) - Ganglionic level infarction (caudate, lentiform nuclei, internal capsule, insula, M1-M3 cortex): 7 - Supraganglionic infarction (M4-M6 cortex): 3 Total score (0-10 with 10 being normal): 10 IMPRESSION: There is no acute intracranial hemorrhage or evidence of acute infarction. ASPECT score is 10. These results were communicated to Dr. Curly Shores at 8:52 am on 03/30/2021 by text page via the [facility] messaging system. Electronically Signed   By: Macy Mis M.D.   On: 03/30/2021 08:55   CT ANGIO HEAD CODE STROKE  Result Date: 03/30/2021 CLINICAL DATA:  Neuro deficit, acute, stroke suspected; mental status change after receiving tPA EXAM: CT HEAD WITHOUT CONTRAST CT ANGIOGRAPHY OF THE HEAD AND NECK TECHNIQUE: Contiguous axial images were obtained from the base of the skull through the vertex without intravenous contrast. Multidetector CT imaging of the head and neck was performed using the standard protocol during bolus administration of intravenous contrast. Multiplanar CT image reconstructions and MIPs were obtained to evaluate the vascular anatomy. Carotid stenosis  measurements (when applicable) are obtained utilizing NASCET criteria, using the distal internal carotid diameter as the denominator. CONTRAST:  178m OMNIPAQUE IOHEXOL 350 MG/ML SOLN COMPARISON:  None. FINDINGS: CT HEAD Brain: Small volume sulcal subarachnoid hemorrhage is again identified in the right temporo-occipital region. There is no new loss of gray-white differentiation. Punctate foci of calcification are identified within the right posterior temporal region, right posterior sylvian fissure, right occipital lobe, and right perimesencephalic cistern. Stable findings of chronic microvascular ischemic changes. Ventricle size is stable. Vascular: Residual contrast from prior CTA. Skull: No new finding. Sinuses/Orbits: No acute finding. Other: None. Review of the MIP images confirms the above findings CTA NECK Aortic arch: Mild aneurysmal dilatation of the ascending thoracic aorta similar to 2021 chest CT. Great vessel origins remain patent. Right carotid system: Remains patent. Plaque along the proximal internal carotid causes mild stenosis. Left carotid system: Remains patent. Calcified plaque along the proximal internal carotid causes mild stenosis. Vertebral arteries: Remain patent. Congenitally small right vertebral artery. Skeleton: No new finding. Other neck: No new finding. Upper chest: No new finding. Review of the MIP images confirms the above findings CTA HEAD Anterior circulation: Intracranial internal carotid arteries remain patent. Anterior cerebral arteries are patent. Right M1 and M2 MCA branches are patent. There is a new focus of calcification along a right M3 MCA branch within the sylvian fissure. However, the vessel appears patent beyond this point. Left middle cerebral artery is patent. Posterior circulation: As on the prior study, there is poor visualization of the intracranial right vertebral artery. Left vertebral remains patent with calcified plaque causing mild stenosis. Basilar artery  is patent. Proximal right posterior cerebral artery is patent, primarily supplied by a posterior communicating artery. There is new occlusion of a P3 PCA branch. Venous sinuses: Patent as allowed by contrast bolus timing. Review of the MIP images confirms the above findings IMPRESSION: Similar small volume sulcal subarachnoid hemorrhage post tPA. Punctate foci of calcification in the right posterior temporal region, right posterior sylvian fissure, right occipital lobe, and right perimesencephalic cistern. Some of these are new since the initial study earlier today reflecting calcific emboli. The right sylvian calcification is along a right M3 MCA branch. The vessel appears patent beyond this point. Right perimesencephalic calcification is along a right P3 PCA branch, which is newly occluded since the earlier CTA. As before, there is poor visualization of intracranial right vertebral artery reflecting high-grade stenosis or occlusion. These results  were called by telephone at the time of interpretation on 03/30/2021 at time of study completion and again at 12:39 pm to provider [facility] , who verbally acknowledged these results. Electronically Signed   By: Macy Mis M.D.   On: 03/30/2021 12:49   CT ANGIO NECK CODE STROKE  Result Date: 03/30/2021 CLINICAL DATA:  Neuro deficit, acute, stroke suspected; mental status change after receiving tPA EXAM: CT HEAD WITHOUT CONTRAST CT ANGIOGRAPHY OF THE HEAD AND NECK TECHNIQUE: Contiguous axial images were obtained from the base of the skull through the vertex without intravenous contrast. Multidetector CT imaging of the head and neck was performed using the standard protocol during bolus administration of intravenous contrast. Multiplanar CT image reconstructions and MIPs were obtained to evaluate the vascular anatomy. Carotid stenosis measurements (when applicable) are obtained utilizing NASCET criteria, using the distal internal carotid diameter as the  denominator. CONTRAST:  170m OMNIPAQUE IOHEXOL 350 MG/ML SOLN COMPARISON:  None. FINDINGS: CT HEAD Brain: Small volume sulcal subarachnoid hemorrhage is again identified in the right temporo-occipital region. There is no new loss of gray-white differentiation. Punctate foci of calcification are identified within the right posterior temporal region, right posterior sylvian fissure, right occipital lobe, and right perimesencephalic cistern. Stable findings of chronic microvascular ischemic changes. Ventricle size is stable. Vascular: Residual contrast from prior CTA. Skull: No new finding. Sinuses/Orbits: No acute finding. Other: None. Review of the MIP images confirms the above findings CTA NECK Aortic arch: Mild aneurysmal dilatation of the ascending thoracic aorta similar to 2021 chest CT. Great vessel origins remain patent. Right carotid system: Remains patent. Plaque along the proximal internal carotid causes mild stenosis. Left carotid system: Remains patent. Calcified plaque along the proximal internal carotid causes mild stenosis. Vertebral arteries: Remain patent. Congenitally small right vertebral artery. Skeleton: No new finding. Other neck: No new finding. Upper chest: No new finding. Review of the MIP images confirms the above findings CTA HEAD Anterior circulation: Intracranial internal carotid arteries remain patent. Anterior cerebral arteries are patent. Right M1 and M2 MCA branches are patent. There is a new focus of calcification along a right M3 MCA branch within the sylvian fissure. However, the vessel appears patent beyond this point. Left middle cerebral artery is patent. Posterior circulation: As on the prior study, there is poor visualization of the intracranial right vertebral artery. Left vertebral remains patent with calcified plaque causing mild stenosis. Basilar artery is patent. Proximal right posterior cerebral artery is patent, primarily supplied by a posterior communicating artery.  There is new occlusion of a P3 PCA branch. Venous sinuses: Patent as allowed by contrast bolus timing. Review of the MIP images confirms the above findings IMPRESSION: Similar small volume sulcal subarachnoid hemorrhage post tPA. Punctate foci of calcification in the right posterior temporal region, right posterior sylvian fissure, right occipital lobe, and right perimesencephalic cistern. Some of these are new since the initial study earlier today reflecting calcific emboli. The right sylvian calcification is along a right M3 MCA branch. The vessel appears patent beyond this point. Right perimesencephalic calcification is along a right P3 PCA branch, which is newly occluded since the earlier CTA. As before, there is poor visualization of intracranial right vertebral artery reflecting high-grade stenosis or occlusion. These results were called by telephone at the time of interpretation on 03/30/2021 at time of study completion and again at 12:39 pm [facility], who verbally acknowledged these results. Electronically Signed   By: PMacy MisM.D.   On: 03/30/2021  12:49   CT ANGIO HEAD NECK W WO CM (CODE STROKE)  Result Date: 03/30/2021 CLINICAL DATA:  Neuro deficit, acute, stroke suspected EXAM: CT ANGIOGRAPHY HEAD AND NECK TECHNIQUE: Multidetector CT imaging of the head and neck was performed using the standard protocol during bolus administration of intravenous contrast. Multiplanar CT image reconstructions and MIPs were obtained to evaluate the vascular anatomy. Carotid stenosis measurements (when applicable) are obtained utilizing NASCET criteria, using the distal internal carotid diameter as the denominator. CONTRAST:  55m OMNIPAQUE IOHEXOL 350 MG/ML SOLN COMPARISON:  None. FINDINGS: CT HEAD Brain: There is no acute intracranial hemorrhage. No new loss of gray-white differentiation. No change since recent prior study. Vascular: No hyperdense vessel. Skull: No new finding. Sinuses/Orbits:  No acute finding. Other: None. Review of the MIP images confirms the above findings CTA NECK Aortic arch: Ascending thoracic aorta is probably dilated but is not well evaluated. Calcified plaque along the arch and patent great vessel origins. No high-grade stenosis of the proximal subclavian arteries. Right carotid system: Patent. There is mixed plaque along the common carotid with minimal stenosis. Calcified plaque along the proximal internal carotid without stenosis. Left carotid system: Patent. Minimal calcified plaque along the common carotid without stenosis. Calcified plaque at the bifurcation and internal carotid origin with less than 50% stenosis. Vertebral arteries: Patent. Left vertebral artery is dominant. There is mild vessel irregularity secondary to cervical spine osteophytic spurring. Skeleton: Degenerative changes of the cervical spine. Other neck: Unremarkable. Upper chest: Scarring and fibrotic changes in the included upper lungs. Retained secretions/debris within the esophagus. Review of the MIP images confirms the above findings CTA HEAD Anterior circulation: Intracranial internal carotid arteries patent with calcified plaque but no significant stenosis. Anterior and middle cerebral arteries are patent. Focal marked stenosis of left A2 ACA. Mild atherosclerotic irregularity of distal ACA and MCA branches. Posterior circulation: There is poor opacification of the intracranial right vertebral artery. Basilar artery is patent but irregular with mild stenosis. A right PICA is not definitely identified. Left PICA origin is patent. Small bilateral AICA origins are noted. Superior cerebellar artery origins are patent. Right posterior communicating artery is present and is the primary supply of the patent right posterior cerebral artery. Left posterior cerebral artery is patent. Venous sinuses: Patent as allowed by contrast bolus timing. Review of the MIP images confirms the above findings IMPRESSION: No  acute intracranial hemorrhage or evidence of acute infarction. 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 Poor visualization of intracranial portion may reflect occlusion or high-grade stenosis. A right PICA if present is not definitely identified. Focal marked stenosis left A2 ACA. No hemodynamically significant stenosis at the ICA origins. Results were reviewed by telephone at the time of interpretation on 03/30/2021 at 10:02 am [facility] who verbally acknowledged these results. Electronically Signed   By: PMacy MisM.D.   On: 03/30/2021 10:13    ____________________________________________  PROCEDURES   Procedure(s) performed (including Critical Care):  .Critical Care  Date/Time: 03/30/2021 2:37 PM Performed by: IDuffy Bruce MD Authorized by: IDuffy Bruce MD   Critical care provider statement:    Critical care time (minutes):  35   Critical care time was exclusive of:  Separately billable procedures and treating other patients and teaching time   Critical care was necessary to treat or prevent imminent or life-threatening deterioration of the following conditions:  Cardiac failure, circulatory failure, respiratory failure and CNS failure or compromise   Critical care was time  spent personally by me on the following activities:  Development of treatment plan with patient or surrogate, discussions with consultants, evaluation of patient's response to treatment, examination of patient, obtaining history from patient or surrogate, ordering and performing treatments and interventions, ordering and review of laboratory studies, ordering and review of radiographic studies, pulse oximetry, re-evaluation of patient's condition and review of old charts   I assumed direction of critical care for this patient from another provider in my specialty: no    ____________________________________________  INITIAL IMPRESSION / MDM / [place] / ED COURSE  As part of my medical decision making, I reviewed the following data within the [place] notes reviewed and incorporated, Old chart reviewed, Notes from prior ED visits, and [place] Controlled Substance Database       *Diago Caris was evaluated in Emergency Department on 03/30/2021 for the symptoms described in the history of present illness. He was evaluated in the context of the global COVID-19 pandemic, which necessitated consideration that the patient might be at risk for infection with the SARS-CoV-2 virus that causes COVID-19. Institutional protocols and algorithms that pertain to the evaluation of patients at risk for COVID-19 are in a state of rapid change based on information released by regulatory bodies including the CDC and federal and state organizations. These policies and algorithms were followed during the patient's care in the ED.  Some ED evaluations and interventions may be delayed as a result of limited staffing during the pandemic.*     Medical Decision Making: 75 year old male here with acute onset of left facial droop and slurred speech.  Patient arrived via POV.  On my assessment, he has left facial droop with slurred speech.  Code stroke immediately activated and patient taken to the CT scanner.  Stat CT head obtained, reviewed, shows no acute intracranial abnormality.  He is on blood thinners but it has been sometime since his last dose and he has no absolute contraindications to tPA.  Discussed with neurology who has had a very long discussion with the patient and wife, will treat with tPA.  Otherwise, platelets normal.  Lab work otherwise reassuring.  EKG nonischemic with no apparent A. fib or abnormality.  After receiving tPA, patient noted to be more confused with pseudobulbar infarct and worsening nystagmus.  He was taken again for stat CT, which showed a small subarachnoid, but also concern for new MCA occlusion.  Repeat CT angio shows  multiple embolic strokes.  Suspect showering embolisms and acute ischemic stroke, with possible small subarachnoid although this is stable on repeat scans.  Patient monitored very closely for blood pressure changes, with a goal of 1 40-1 60, both to prevent hypertension worsening bleed, as well as hypoperfusion.  Patient admitted to ICU at [facility].  ____________________________________________  FINAL CLINICAL IMPRESSION(S) / ED DIAGNOSES  Final diagnoses:  Cerebrovascular accident (CVA) due to embolism of right posterior cerebral artery ([place])     MEDICATIONS GIVEN DURING THIS VISIT:  Medications  sodium chloride flush (NS) 0.9 % injection 3 mL (has no administration in time range)  labetalol (NORMODYNE) injection 10 mg ( Intravenous Canceled Entry 03/30/21 1057)  clevidipine (CLEVIPREX) infusion 0.5 mg/mL (2.25 mg/hr Intravenous Rate/Dose Change 03/30/21 1416)  sodium chloride 0.9 % bolus 500 mL (has no administration in time range)  0.9 %  sodium chloride infusion (has no administration in time range)  sodium chloride 0.9 % bolus 500 mL (has no administration in time range)  ondansetron (ZOFRAN)  injection 4 mg (has no administration in time range)  alteplase (ACTIVASE) 1 mg/mL infusion SOLN 89.6 mg (0 mg Intravenous Stopped 03/30/21 1015)    Followed by  0.9 %  sodium chloride infusion (0 mLs Intravenous Stopped 03/30/21 1055)  iohexol (OMNIPAQUE) 350 MG/ML injection 75 mL (75 mLs Intravenous Contrast Given 03/30/21 0937)  iohexol (OMNIPAQUE) 350 MG/ML injection 100 mL (100 mLs Intravenous Contrast Given 03/30/21 1206)     ED Discharge Orders     None        Note:  This document was prepared using Dragon voice recognition software and may include unintentional dictation errors.   Duffy Bruce, MD 03/30/21 1440

## 2021-03-30 NOTE — ED Notes (Signed)
Called [facility] for transfer to [facility] ICU  [phone]

## 2021-03-30 NOTE — H&P (Addendum)
Referring Physician: Dr. Curly Shores    Chief Complaint: Left hand weakness  HPI: Geoffrey Wells is an 75 y.o. male veteran with hypertension, hyperlipidemia, prior TIA (on aspirin), ascending aortic aneurysm, carotid artery stenosis, history of smoking, and history of bilateral PE (2021). He presented to [facility] ED earlier this morning for left hand weakness and a code stroke was subsequently called.   He was seen by Dr. Curly Shores who provided the below narrative:  "He was in his normal state of health this morning giving a theology talk.  And right while driving home at [id number] AM he acutely developed left hand weakness.  On presentation to the ED he was evaluated as a code stroke.  Head CT was negative for acute intracranial process and risks and benefits were reviewed with patient and wife with decision to give tPA (start time 9:14 AM, completed at 10:15) for concern for stuttering lacunar stroke.  Initially his symptoms were left facial droop and fluctuating left hand weakness (without drift), left arm ataxia and dysarthria (fluctuating).     On further history he reported he had had a chiropractic neck manipulation the day before and therefore CTA was obtained to assess for dissection.  No dissection was revealed but he was noted to have a very diminutive right vertebral artery with intracranial loss of opacification and no opacification of a right PICA.  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  En route to CTA he was having worsening weakness and dysarthria for which dry head CT was also repeated just prior to CTA, which appeared stable; blood pressure goal was updated to 140-180 to allow for brainstem perfusion in the setting of the significant stenosis.  However he continued to worsen, briefly had a blood pressure in the 190s which improved with a dose of labetalol, and began to complain of a severe headache, with examination notable for brainstem findings of new nystagmus most notable in  upgaze (pendular side to side), loss of uvula elevation on the right, and pseudobulbar affect.     Head CT was again repeated which showed trace subarachnoid hemorrhage on the right near the sylvian fissure.  Blood pressure goal was updated to less than 140, but given high risk of his brainstem stroke progression tPA reversal was not immediately pursued.  On repeat examination at about 11:30 AM he was noted to additionally have left-sided visual field cut as well as some left-sided neglect. Head CT was essentially stable with minimal progression of the [facility], and therefore CTA was repeated to evaluate for potential R MCA occlusion. 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 Case discussed extensively with Dr. Tamera Punt (neuroIR) and Dr. Lavera Guise (Stroke neurology). Given difficulty of approach to R PCA, likely to require stenting of the L vert occlusion followed by traversal of a second stenotic area, as well as SAH which would make DAPT required to prevent in-stent thrombosis very risky with regards to worsening bleeding, decided to defer intervention.   BP goal was increased to 150-170 SBP with improvement of brainstem findings, then reduced to 140-160, with stable R PCA related findings (hemianopia, inability to look to the left), but also improved left sided weakness. Headache was slightly worse but generally stable (at worst patient reported 6/10, which improved to 4/10 and most recently 4.5/10; wife notes he has a very high pain tolerance).  Stability head CT revealed temporal lobe intraparenchymal hemorrhage, after which decision was made to reverse with TXA.  At that  time the patient's examination remained stable, NIH of 6 for mild drowsiness, right gaze preference, left hemianopia, left facial droop that is mild, limb ataxia in the left upper extremity, mild dysarthria (one-point for each finding except 2 points for the hemianopia)   On initial history he reported some chronic  intermittent left hand tingling managed by chiropractic manipulation, chronic neck pain managed by chiropractic manipulation (high-speed manipulation, last performed 7/28), an episode of chest pain and left hand weakness the week prior to presentation which fully resolved, in the setting of increasing in his exercise from 2 miles daily walking to 3 miles daily walking.  He reports prior heavier alcohol use and reports having cut back to no more than 7 drinks per week, and no more than 2 drinks per day.  Denies any other substance use.  Reports chronic shortness of breath that waxes and wanes since his COVID diagnosis in May 2021.   LKW: 7:30 AM on 7/29 tPA given?: No, or if yes, time given 9:13 AM Premorbid modified rankin scale:     0 - No symptoms.   Hunt & Hess score 1: Mild Headache, Alert and Oriented, Minimal (if any) Nuchal Rigidity Patients with Grade I have an approximate 30% mortality."  Currently the patient has the same deficits and symptoms as he had when he left [facility] to be transported to [facility].   Home medications include ASA.   Past Medical History:  Diagnosis Date   Pneumonia due to COVID-19 virus 01/10/2020    Past Surgical History:  Procedure Laterality Date   HERNIA REPAIR      No family history on file. Social History:  reports that he has quit smoking. His smoking use included cigarettes. He has a 12.50 pack-year smoking history. He has never used smokeless tobacco. He reports current alcohol use of about 7.0 standard drinks of alcohol per week. He reports previous drug use.  Allergies: No Known Allergies  No current facility-administered medications on file prior to encounter.   Current Outpatient Medications on File Prior to Encounter  Medication Sig Dispense Refill   ascorbic acid (VITAMIN C) 500 MG tablet Take 1 tablet (500 mg total) by mouth daily. 30 tablet 0   aspirin 81 MG chewable tablet Chew 81 mg by mouth every evening.     Cholecalciferol (VITAMIN D3)  250 MCG (10000 UT) TABS Take 10,000 Units by mouth daily.     lisinopril ([name]) 20 MG tablet Take 10 mg by mouth daily.     loratadine (CLARITIN) 10 MG tablet Take 10 mg by mouth daily as needed for allergies.     melatonin 5 MG TABS Take 5 mg by mouth at bedtime as needed (sleep).     Multiple Vitamins-Minerals (MULTIVITAMIN WITH MINERALS) tablet Take 1 tablet by mouth daily.     vitamin B-12 (CYANOCOBALAMIN) 1000 MCG tablet Take 1,000 mcg by mouth daily.     zinc sulfate 220 (50 Zn) MG capsule Take 1 capsule (220 mg total) by mouth daily. 30 capsule 0     ROS: Negative unless stated in the HPI  Physical Examination: There were no vitals taken for this visit.  General: Elderly appearing male, in no acute distress HENT: head atraumatic, MMM Eyes: no scleral icterus or conjunctival injection Cardiac: Tachycardic rate on telemetry Pulm: breathing comfortably on room air, no accessory muscle use GI: abdomen soft, not distended Skin: no rash or lesion on limited exam  Neurologic Examination: Mental status: alert.  Oriented to person, city, state,  year and month but not the day of the week.  Mild dysarthria. Speech fluent in the context of dysarthria. Naming intact. Comprehension intact for all commands. Left hemineglect. Cranial Nerves: III, IV, VI: Pupils equal, round and reactive to light.  Left homonymous hemianopsia V: Sensation intact to temp on the right but decreased on the left.   VII: Mild left facial droop.  VIII: Hearing intact to voice.  IX, X, XII: Uvula midline. Gag reflex intact. Tongue midline XI: Normal shoulder shrug strength Motor:  5/5 strength in the RUE proximally and distally.  Subtle 4+/5 weakness to left deltoid, triceps and grip.  RLE 5/5 LLE with subtle 4/5 weakness to hip flexion, 4+/5 knee extension. Also with slightly increased latencies of motor responses.  No atrophy.  No tremor. Subtle parietal drift on the the left.  Sensory: Intact to FT and  temp in the bilateral upper and lower extremities. No extinction to DSS.  Cerebellar: Slow but non-ataxic finger-to-nose testing on the left, does have difficulty targeting his nose.  Gait: Deferred  Results for orders placed or performed during the hospital encounter of 03/30/21 (from the past 48 hour(s))  Protime-INR     Status: None   Collection Time: 03/30/21  8:58 AM  Result Value Ref Range   Prothrombin Time 13.5 11.4 - 15.2 seconds   INR 1.0 0.8 - 1.2    Comment: (NOTE) INR goal varies based on device and disease states. Performed at [facility], [address] [place], [place] [address]   APTT     Status: None   Collection Time: 03/30/21  8:58 AM  Result Value Ref Range   aPTT 29 24 - 36 seconds    Comment: Performed at [facility], [address] [place], [place] [address]  CBC     Status: Abnormal   Collection Time: 03/30/21  8:58 AM  Result Value Ref Range   WBC 5.3 4.0 - 10.5 K/uL   RBC 4.70 4.22 - 5.81 MIL/uL   Hemoglobin 13.5 13.0 - 17.0 g/dL   HCT 39.8 39.0 - 52.0 %   MCV 84.7 80.0 - 100.0 fL   MCH 28.7 26.0 - 34.0 pg   MCHC 33.9 30.0 - 36.0 g/dL   RDW 16.1 (H) 11.5 - 15.5 %   Platelets 182 150 - 400 K/uL   nRBC 0.0 0.0 - 0.2 %    Comment: Performed at [facility], [address] [place], [place] [address]  Differential     Status: None   Collection Time: 03/30/21  8:58 AM  Result Value Ref Range   Neutrophils Relative % 60 %   Neutro Abs 3.2 1.7 - 7.7 K/uL   Lymphocytes Relative 27 %   Lymphs Abs 1.4 0.7 - 4.0 K/uL   Monocytes Relative 10 %   Monocytes Absolute 0.5 0.1 - 1.0 K/uL   Eosinophils Relative 2 %   Eosinophils Absolute 0.1 0.0 - 0.5 K/uL   Basophils Relative 1 %   Basophils Absolute 0.1 0.0 - 0.1 K/uL   Immature Granulocytes 0 %   Abs Immature Granulocytes 0.01 0.00 - 0.07 K/uL    Comment: Performed at [facility], [address] [place],  [address]  Comprehensive metabolic panel      Status: Abnormal   Collection Time: 03/30/21  8:58 AM  Result Value Ref Range   Sodium 138 135 - 145 mmol/L   Potassium 4.4 3.5 - 5.1 mmol/L   Chloride 108 98 -  111 mmol/L   CO2 25 22 - 32 mmol/L   Glucose, Bld 112 (H) 70 - 99 mg/dL    Comment: Glucose reference range applies only to samples taken after fasting for at least 8 hours.   BUN 20 8 - 23 mg/dL   Creatinine, Ser 1.03 0.61 - 1.24 mg/dL   Calcium 9.1 8.9 - 10.3 mg/dL   Total Protein 7.5 6.5 - 8.1 g/dL   Albumin 4.2 3.5 - 5.0 g/dL   AST 29 15 - 41 U/L   ALT 20 0 - 44 U/L   Alkaline Phosphatase 63 38 - 126 U/L   Total Bilirubin 1.1 0.3 - 1.2 mg/dL   GFR, Estimated >60 >60 mL/min    Comment: (NOTE) Calculated using the CKD-EPI Creatinine Equation (2021)    Anion gap 5 5 - 15    Comment: Performed at [facility], [address] [place], [place] [address]  CBG monitoring, ED     Status: Abnormal   Collection Time: 03/30/21  9:02 AM  Result Value Ref Range   Glucose-Capillary 115 (H) 70 - 99 mg/dL    Comment: Glucose reference range applies only to samples taken after fasting for at least 8 hours.  Troponin I (High Sensitivity)     Status: None   Collection Time: 03/30/21 11:09 AM  Result Value Ref Range   Troponin I (High Sensitivity) 12 <18 ng/L    Comment: (NOTE) Elevated high sensitivity troponin I (hsTnI) values and significant  changes across serial measurements may suggest ACS but many other  chronic and acute conditions are known to elevate hsTnI results.  Refer to the "Links" section for chest pain algorithms and additional  guidance. Performed at [facility], [address] [place], [place] [address]   Fibrinogen     Status: Abnormal   Collection Time: 03/30/21 11:09 AM  Result Value Ref Range   Fibrinogen 66 (LL) 210 - 475 mg/dL    Comment: CRITICAL RESULT CALLED TO, READ BACK BY AND VERIFIED WITH: ASHLEY ELLWANGER ON 03/30/21 AT 1156 QSD (NOTE) Fibrinogen results may be  underestimated in patients receiving thrombolytic therapy. Performed at [facility], [address] [place], [place] [address]   Resp Panel by RT-PCR (Flu A&B, Covid) Nasopharyngeal Swab     Status: None   Collection Time: 03/30/21 12:28 PM   Specimen: Nasopharyngeal Swab; Nasopharyngeal(NP) swabs in vial transport medium  Result Value Ref Range   SARS Coronavirus 2 by RT PCR NEGATIVE NEGATIVE    Comment: (NOTE) SARS-CoV-2 target nucleic acids are NOT DETECTED.  The SARS-CoV-2 RNA is generally detectable in upper respiratory specimens during the acute phase of infection. The lowest concentration of SARS-CoV-2 viral copies this assay can detect is 138 copies/mL. A negative result does not preclude SARS-Cov-2 infection and should not be used as the sole basis for treatment or other patient management decisions. A negative result may occur with  improper specimen collection/handling, submission of specimen other than nasopharyngeal swab, presence of viral mutation(s) within the areas targeted by this assay, and inadequate number of viral copies(<138 copies/mL). A negative result must be combined with clinical observations, patient history, and epidemiological information. The expected result is Negative.  Fact Sheet for Patients:  EntrepreneurPulse.com.au  Fact Sheet for Healthcare Providers:  IncredibleEmployment.be  This test is no t yet approved or cleared by the [place] FDA and  has been authorized for detection and/or diagnosis of SARS-CoV-2 by FDA under an Emergency Use Authorization (EUA). This  EUA will remain  in effect (meaning this test can be used) for the duration of the COVID-19 declaration under Section 564(b)(1) of the Act, 21 U.S.C.section 360bbb-3(b)(1), unless the authorization is terminated  or revoked sooner.       Influenza A by PCR NEGATIVE NEGATIVE   Influenza B by PCR NEGATIVE NEGATIVE    Comment:  (NOTE) The Xpert Xpress SARS-CoV-2/FLU/RSV plus assay is intended as an aid in the diagnosis of influenza from Nasopharyngeal swab specimens and should not be used as a sole basis for treatment. Nasal washings and aspirates are unacceptable for Xpert Xpress SARS-CoV-2/FLU/RSV testing.  Fact Sheet for Patients: EntrepreneurPulse.com.au  Fact Sheet for Healthcare Providers: IncredibleEmployment.be  This test is not yet approved or cleared by the [place] FDA and has been authorized for detection and/or diagnosis of SARS-CoV-2 by FDA under an Emergency Use Authorization (EUA). This EUA will remain in effect (meaning this test can be used) for the duration of the COVID-19 declaration under Section 564(b)(1) of the Act, 21 U.S.C. section 360bbb-3(b)(1), unless the authorization is terminated or revoked.  Performed at [facility], [address] [place], [place] [address]   Fibrinogen     Status: Abnormal   Collection Time: 03/30/21  3:41 PM  Result Value Ref Range   Fibrinogen 73 (LL) 210 - 475 mg/dL    Comment: CRITICAL RESULT CALLED TO, READ BACK BY AND VERIFIED WITH: Orbie Pyo, RN AT [id number] ON 03/30/21 BY JRH (NOTE) Fibrinogen results may be underestimated in patients receiving thrombolytic therapy. Performed at [facility], [address] [place], [place] [address]   Type and screen [place]     Status: None (Preliminary result)   Collection Time: 03/30/21  4:16 PM  Result Value Ref Range   ABO/RH(D) PENDING    Antibody Screen PENDING    Sample Expiration      04/02/2021,2359 Performed at [facility], [address] [place], [place] [address]   PTT     Status: Abnormal   Collection Time: 03/30/21  4:16 PM  Result Value Ref Range   aPTT 40 (H) 24 - 36 seconds    Comment:        IF BASELINE aPTT IS ELEVATED, SUGGEST PATIENT RISK ASSESSMENT BE USED TO DETERMINE  APPROPRIATE ANTICOAGULANT THERAPY. Performed at [facility], [address] [place], [place] [address]   Protime-INR     Status: Abnormal   Collection Time: 03/30/21  4:16 PM  Result Value Ref Range   Prothrombin Time 18.8 (H) 11.4 - 15.2 seconds   INR 1.6 (H) 0.8 - 1.2    Comment: (NOTE) INR goal varies based on device and disease states. Performed at [facility], [address] [place], [place] [address]   CBC     Status: Abnormal   Collection Time: 03/30/21  4:16 PM  Result Value Ref Range   WBC 7.2 4.0 - 10.5 K/uL   RBC 4.62 4.22 - 5.81 MIL/uL   Hemoglobin 13.3 13.0 - 17.0 g/dL   HCT 39.3 39.0 - 52.0 %   MCV 85.1 80.0 - 100.0 fL   MCH 28.8 26.0 - 34.0 pg   MCHC 33.8 30.0 - 36.0 g/dL   RDW 16.2 (H) 11.5 - 15.5 %   Platelets 182 150 - 400 K/uL   nRBC 0.0 0.0 - 0.2 %    Comment: Performed at [facility], [address], [place], [place] [address]   [place]  CONTRAST  Result Date: 03/30/2021 CLINICAL DATA:  Stroke, follow-up; SAH monitoring. Additional history provided: TPA follow-up. EXAM: CT HEAD WITHOUT CONTRAST TECHNIQUE: Contiguous axial images were obtained from the base of the skull through the vertex without intravenous contrast. COMPARISON:  CT angiogram head/neck 03/30/2021. Noncontrast head CT examinations 03/30/2021 and earlier. FINDINGS: Brain: Mild generalized cerebral atrophy. New from the head CT performed earlier today there is a parenchymal hemorrhage within the posterior right temporal lobe measuring 3.6 x 3.1 x 1.9 cm. Small volume subarachnoid hemorrhage along the adjacent right temporal occipital lobes appears slightly increased. No new loss of gray-white differentiation is appreciated. Stable chronic small vessel ischemic changes within the cerebral white matter. No evidence of hydrocephalus.  No midline shift. Vascular: Atherosclerotic calcifications. Redemonstrated punctate foci of calcification along the right cerebral  hemisphere within the right MCA and PCA vascular territories suspicious for calcified emboli. Skull: No calvarial fracture. Redemonstrated nonspecific thinning of the left parietal calvarium. Sinuses/Orbits: Visualized orbits show no acute finding. Partial opacification of a posterior right ethmoid air cell. Background trace mucosal thickening within the bilateral ethmoid sinuses. Mild mucosal thickening within the inferior left maxillary sinus. These results were called by telephone at the time of interpretation on 03/30/2021 at 3:20 pm to provider [facility] , who verbally acknowledged these results. IMPRESSION: 3.6 x 3.1 x 1.9 cm acute parenchymal hemorrhage within the posterior right temporal lobe, new from the head CT performed earlier today. Small-volume acute subarachnoid hemorrhage along the adjacent right temporal and occipital lobes, mildly increased. No new loss of gray-white differentiation is identified. Stable chronic small vessel ischemic changes within the cerebral white matter. Electronically Signed   By: Kellie Simmering DO   On: 03/30/2021 15:23   CT HEAD WO CONTRAST  Result Date: 03/30/2021 CLINICAL DATA:  Neuro deficit, acute, stroke suspected; mental status change after receiving tPA EXAM: CT HEAD WITHOUT CONTRAST CT ANGIOGRAPHY OF THE HEAD AND NECK TECHNIQUE: Contiguous axial images were obtained from the base of the skull through the vertex without intravenous contrast. Multidetector CT imaging of the head and neck was performed using the standard protocol during bolus administration of intravenous contrast. Multiplanar CT image reconstructions and MIPs were obtained to evaluate the vascular anatomy. Carotid stenosis measurements (when applicable) are obtained utilizing NASCET criteria, using the distal internal carotid diameter as the denominator. CONTRAST:  144m OMNIPAQUE IOHEXOL 350 MG/ML SOLN COMPARISON:  None. FINDINGS: CT HEAD Brain: Small volume sulcal subarachnoid hemorrhage is  again identified in the right temporo-occipital region. There is no new loss of gray-white differentiation. Punctate foci of calcification are identified within the right posterior temporal region, right posterior sylvian fissure, right occipital lobe, and right perimesencephalic cistern. Stable findings of chronic microvascular ischemic changes. Ventricle size is stable. Vascular: Residual contrast from prior CTA. Skull: No new finding. Sinuses/Orbits: No acute finding. Other: None. Review of the MIP images confirms the above findings CTA NECK Aortic arch: Mild aneurysmal dilatation of the ascending thoracic aorta similar to 2021 chest CT. Great vessel origins remain patent. Right carotid system: Remains patent. Plaque along the proximal internal carotid causes mild stenosis. Left carotid system: Remains patent. Calcified plaque along the proximal internal carotid causes mild stenosis. Vertebral arteries: Remain patent. Congenitally small right vertebral artery. Skeleton: No new finding. Other neck: No new finding. Upper chest: No new finding. Review of the MIP images confirms the above findings CTA HEAD Anterior circulation: Intracranial internal carotid arteries remain patent. Anterior cerebral arteries are patent. Right M1 and M2 MCA  branches are patent. There is a new focus of calcification along a right M3 MCA branch within the sylvian fissure. However, the vessel appears patent beyond this point. Left middle cerebral artery is patent. Posterior circulation: As on the prior study, there is poor visualization of the intracranial right vertebral artery. Left vertebral remains patent with calcified plaque causing mild stenosis. Basilar artery is patent. Proximal right posterior cerebral artery is patent, primarily supplied by a posterior communicating artery. There is new occlusion of a P3 PCA branch. Venous sinuses: Patent as allowed by contrast bolus timing. Review of the MIP images confirms the above findings  IMPRESSION: Similar small volume sulcal subarachnoid hemorrhage post tPA. Punctate foci of calcification in the right posterior temporal region, right posterior sylvian fissure, right occipital lobe, and right perimesencephalic cistern. Some of these are new since the initial study earlier today reflecting calcific emboli. The right sylvian calcification is along a right M3 MCA branch. The vessel appears patent beyond this point. Right perimesencephalic calcification is along a right P3 PCA branch, which is newly occluded since the earlier CTA. As before, there is poor visualization of intracranial right vertebral artery reflecting high-grade stenosis or occlusion. These results were called by telephone at the time of interpretation on 03/30/2021 at time of study completion and again at 12:39 pm to provider [facility] , who verbally acknowledged these results. Electronically Signed   By: Macy Mis M.D.   On: 03/30/2021 12:49   CT HEAD WO CONTRAST  Addendum Date: 03/30/2021   ADDENDUM REPORT: 03/30/2021 11:49 ADDENDUM: Study discussed by telephone with Dr. Lesleigh Noe on 03/30/2021 at 1116 hours. And also I discussed this scan again with Dr. Erlinda Hong at 1145 hours. He notes a new vessel related density in the right sylvian fissure from the two prior noncontrast Head CTs today - see series 5, image 17. And this appears to correspond to the level of a Right MCA posterior M3 branch when correlated with the earlier sagittal thin CTA images. He advises that the patient continues to have progression of posterior circulation type symptoms. And they may repeat a CTA Head and Neck. If so attention can also be directed to this particular vessel. Electronically Signed   By: Genevie Ann M.D.   On: 03/30/2021 11:49   Result Date: 03/30/2021 CLINICAL DATA:  75 year old male code stroke presentation this morning with worsening NIH scale status post IV tPA. EXAM: CT HEAD WITHOUT CONTRAST TECHNIQUE: Contiguous axial images were  obtained from the base of the skull through the vertex without intravenous contrast. COMPARISON:  Plain head CT 0835 hours today. FINDINGS: Brain: Residual intravascular contrast. There is a trace amount of new subarachnoid density along the junction of the posterior right temporal and occipital lobes compatible with trace subarachnoid hemorrhage. See series 5, image 15 and series 4, image 53. This tracks anteriorly toward the inferior right temporal gyrus. No regional mass effect. No regional cytotoxic edema identified. No other acute intracranial hemorrhage identified. No IVH. No cortically based acute infarct identified. No midline shift, mass effect, or evidence of intracranial mass lesion. No ventriculomegaly. Gray-white matter differentiation appears stable since 0835 hours, patchy white matter hypodensity. Vascular: Calcified atherosclerosis at the skull base. The major intracranial vascular structures appear to remain enhancing. Skull: Negative. Sinuses/Orbits: Visualized paranasal sinuses and mastoids are stable and well aerated. Other: Visualized orbits and scalp soft tissues are within normal limits. IMPRESSION: 1. Positive for trace new subarachnoid hemorrhage along the inferior right hemisphere at the junction of  the posterior temporal and occipital lobes. No associated mass effect. 2. But no acute cortically based infarct identified and otherwise stable since 0835 hours today. 3. These results were communicated to Dr. Curly Shores at 10:57 am on 03/30/2021 by text page via the [facility] messaging system. Electronically Signed: By: Genevie Ann M.D. On: 03/30/2021 10:57   CT HEAD CODE STROKE WO CONTRAST  Result Date: 03/30/2021 CLINICAL DATA:  Code stroke.  Neuro deficit, acute, stroke suspected EXAM: CT HEAD WITHOUT CONTRAST TECHNIQUE: Contiguous axial images were obtained from the base of the skull through the vertex without intravenous contrast. COMPARISON:  None. FINDINGS: Brain: There is no acute intracranial  hemorrhage, mass effect, or edema. Gray-white differentiation is preserved. Patchy and confluent areas of low-attenuation in the supratentorial white matter are nonspecific probably reflect moderate chronic microvascular ischemic changes. Prominence of the ventricles and sulci reflects minor generalized parenchymal volume loss. No extra-axial collection. Vascular: No hyperdense vessel. There is intracranial atherosclerotic calcification at the skull base. Skull: Unremarkable. Sinuses/Orbits: No acute abnormality. Other: Mastoid air cells are clear. ASPECTS ([place] Stroke Program Early CT Score) - Ganglionic level infarction (caudate, lentiform nuclei, internal capsule, insula, M1-M3 cortex): 7 - Supraganglionic infarction (M4-M6 cortex): 3 Total score (0-10 with 10 being normal): 10 IMPRESSION: There is no acute intracranial hemorrhage or evidence of acute infarction. ASPECT score is 10. These results were communicated to Dr. Curly Shores at 8:52 am on 03/30/2021 by text page via the [facility] messaging system. Electronically Signed   By: Macy Mis M.D.   On: 03/30/2021 08:55   CT ANGIO HEAD CODE STROKE  Result Date: 03/30/2021 CLINICAL DATA:  Neuro deficit, acute, stroke suspected; mental status change after receiving tPA EXAM: CT HEAD WITHOUT CONTRAST CT ANGIOGRAPHY OF THE HEAD AND NECK TECHNIQUE: Contiguous axial images were obtained from the base of the skull through the vertex without intravenous contrast. Multidetector CT imaging of the head and neck was performed using the standard protocol during bolus administration of intravenous contrast. Multiplanar CT image reconstructions and MIPs were obtained to evaluate the vascular anatomy. Carotid stenosis measurements (when applicable) are obtained utilizing NASCET criteria, using the distal internal carotid diameter as the denominator. CONTRAST:  179m OMNIPAQUE IOHEXOL 350 MG/ML SOLN COMPARISON:  None. FINDINGS: CT HEAD Brain: Small volume sulcal subarachnoid  hemorrhage is again identified in the right temporo-occipital region. There is no new loss of gray-white differentiation. Punctate foci of calcification are identified within the right posterior temporal region, right posterior sylvian fissure, right occipital lobe, and right perimesencephalic cistern. Stable findings of chronic microvascular ischemic changes. Ventricle size is stable. Vascular: Residual contrast from prior CTA. Skull: No new finding. Sinuses/Orbits: No acute finding. Other: None. Review of the MIP images confirms the above findings CTA NECK Aortic arch: Mild aneurysmal dilatation of the ascending thoracic aorta similar to 2021 chest CT. Great vessel origins remain patent. Right carotid system: Remains patent. Plaque along the proximal internal carotid causes mild stenosis. Left carotid system: Remains patent. Calcified plaque along the proximal internal carotid causes mild stenosis. Vertebral arteries: Remain patent. Congenitally small right vertebral artery. Skeleton: No new finding. Other neck: No new finding. Upper chest: No new finding. Review of the MIP images confirms the above findings CTA HEAD Anterior circulation: Intracranial internal carotid arteries remain patent. Anterior cerebral arteries are patent. Right M1 and M2 MCA branches are patent. There is a new focus of calcification along a right M3 MCA branch within the sylvian fissure. However, the vessel appears patent beyond this point.  Left middle cerebral artery is patent. Posterior circulation: As on the prior study, there is poor visualization of the intracranial right vertebral artery. Left vertebral remains patent with calcified plaque causing mild stenosis. Basilar artery is patent. Proximal right posterior cerebral artery is patent, primarily supplied by a posterior communicating artery. There is new occlusion of a P3 PCA branch. Venous sinuses: Patent as allowed by contrast bolus timing. Review of the MIP images confirms the  above findings IMPRESSION: Similar small volume sulcal subarachnoid hemorrhage post tPA. Punctate foci of calcification in the right posterior temporal region, right posterior sylvian fissure, right occipital lobe, and right perimesencephalic cistern. Some of these are new since the initial study earlier today reflecting calcific emboli. The right sylvian calcification is along a right M3 MCA branch. The vessel appears patent beyond this point. Right perimesencephalic calcification is along a right P3 PCA branch, which is newly occluded since the earlier CTA. As before, there is poor visualization of intracranial right vertebral artery reflecting high-grade stenosis or occlusion. These results were called by telephone at the time of interpretation on 03/30/2021 at time of study completion and again at 12:39 pm to provider [facility] , who verbally acknowledged these results. Electronically Signed   By: Macy Mis M.D.   On: 03/30/2021 12:49   CT ANGIO NECK CODE STROKE  Result Date: 03/30/2021 CLINICAL DATA:  Neuro deficit, acute, stroke suspected; mental status change after receiving tPA EXAM: CT HEAD WITHOUT CONTRAST CT ANGIOGRAPHY OF THE HEAD AND NECK TECHNIQUE: Contiguous axial images were obtained from the base of the skull through the vertex without intravenous contrast. Multidetector CT imaging of the head and neck was performed using the standard protocol during bolus administration of intravenous contrast. Multiplanar CT image reconstructions and MIPs were obtained to evaluate the vascular anatomy. Carotid stenosis measurements (when applicable) are obtained utilizing NASCET criteria, using the distal internal carotid diameter as the denominator. CONTRAST:  16m OMNIPAQUE IOHEXOL 350 MG/ML SOLN COMPARISON:  None. FINDINGS: CT HEAD Brain: Small volume sulcal subarachnoid hemorrhage is again identified in the right temporo-occipital region. There is no new loss of gray-white differentiation.  Punctate foci of calcification are identified within the right posterior temporal region, right posterior sylvian fissure, right occipital lobe, and right perimesencephalic cistern. Stable findings of chronic microvascular ischemic changes. Ventricle size is stable. Vascular: Residual contrast from prior CTA. Skull: No new finding. Sinuses/Orbits: No acute finding. Other: None. Review of the MIP images confirms the above findings CTA NECK Aortic arch: Mild aneurysmal dilatation of the ascending thoracic aorta similar to 2021 chest CT. Great vessel origins remain patent. Right carotid system: Remains patent. Plaque along the proximal internal carotid causes mild stenosis. Left carotid system: Remains patent. Calcified plaque along the proximal internal carotid causes mild stenosis. Vertebral arteries: Remain patent. Congenitally small right vertebral artery. Skeleton: No new finding. Other neck: No new finding. Upper chest: No new finding. Review of the MIP images confirms the above findings CTA HEAD Anterior circulation: Intracranial internal carotid arteries remain patent. Anterior cerebral arteries are patent. Right M1 and M2 MCA branches are patent. There is a new focus of calcification along a right M3 MCA branch within the sylvian fissure. However, the vessel appears patent beyond this point. Left middle cerebral artery is patent. Posterior circulation: As on the prior study, there is poor visualization of the intracranial right vertebral artery. Left vertebral remains patent with calcified plaque causing mild stenosis. Basilar artery is patent. Proximal right posterior cerebral artery is patent,  primarily supplied by a posterior communicating artery. There is new occlusion of a P3 PCA branch. Venous sinuses: Patent as allowed by contrast bolus timing. Review of the MIP images confirms the above findings IMPRESSION: Similar small volume sulcal subarachnoid hemorrhage post tPA. Punctate foci of calcification in  the right posterior temporal region, right posterior sylvian fissure, right occipital lobe, and right perimesencephalic cistern. Some of these are new since the initial study earlier today reflecting calcific emboli. The right sylvian calcification is along a right M3 MCA branch. The vessel appears patent beyond this point. Right perimesencephalic calcification is along a right P3 PCA branch, which is newly occluded since the earlier CTA. As before, there is poor visualization of intracranial right vertebral artery reflecting high-grade stenosis or occlusion. These results were called by telephone at the time of interpretation on 03/30/2021 at time of study completion and again at 12:39 pm to provider [facility] , who verbally acknowledged these results. Electronically Signed   By: Macy Mis M.D.   On: 03/30/2021 12:49   CT ANGIO HEAD NECK W WO CM (CODE STROKE)  Result Date: 03/30/2021 CLINICAL DATA:  Neuro deficit, acute, stroke suspected EXAM: CT ANGIOGRAPHY HEAD AND NECK TECHNIQUE: Multidetector CT imaging of the head and neck was performed using the standard protocol during bolus administration of intravenous contrast. Multiplanar CT image reconstructions and MIPs were obtained to evaluate the vascular anatomy. Carotid stenosis measurements (when applicable) are obtained utilizing NASCET criteria, using the distal internal carotid diameter as the denominator. CONTRAST:  45m OMNIPAQUE IOHEXOL 350 MG/ML SOLN COMPARISON:  None. FINDINGS: CT HEAD Brain: There is no acute intracranial hemorrhage. No new loss of gray-white differentiation. No change since recent prior study. Vascular: No hyperdense vessel. Skull: No new finding. Sinuses/Orbits: No acute finding. Other: None. Review of the MIP images confirms the above findings CTA NECK Aortic arch: Ascending thoracic aorta is probably dilated but is not well evaluated. Calcified plaque along the arch and patent great vessel origins. No high-grade stenosis  of the proximal subclavian arteries. Right carotid system: Patent. There is mixed plaque along the common carotid with minimal stenosis. Calcified plaque along the proximal internal carotid without stenosis. Left carotid system: Patent. Minimal calcified plaque along the common carotid without stenosis. Calcified plaque at the bifurcation and internal carotid origin with less than 50% stenosis. Vertebral arteries: Patent. Left vertebral artery is dominant. There is mild vessel irregularity secondary to cervical spine osteophytic spurring. Skeleton: Degenerative changes of the cervical spine. Other neck: Unremarkable. Upper chest: Scarring and fibrotic changes in the included upper lungs. Retained secretions/debris within the esophagus. Review of the MIP images confirms the above findings CTA HEAD Anterior circulation: Intracranial internal carotid arteries patent with calcified plaque but no significant stenosis. Anterior and middle cerebral arteries are patent. Focal marked stenosis of left A2 ACA. Mild atherosclerotic irregularity of distal ACA and MCA branches. Posterior circulation: There is poor opacification of the intracranial right vertebral artery. Basilar artery is patent but irregular with mild stenosis. A right PICA is not definitely identified. Left PICA origin is patent. Small bilateral AICA origins are noted. Superior cerebellar artery origins are patent. Right posterior communicating artery is present and is the primary supply of the patent right posterior cerebral artery. Left posterior cerebral artery is patent. Venous sinuses: Patent as allowed by contrast bolus timing. Review of the MIP images confirms the above findings IMPRESSION: No acute intracranial hemorrhage or evidence of acute infarction. Congenitally small caliber right vertebral artery is patent in  the neck. Poor visualization of intracranial portion may reflect occlusion or high-grade stenosis. A right PICA if present is not  definitely identified. Focal marked stenosis left A2 ACA. No hemodynamically significant stenosis at the ICA origins. Results were reviewed by telephone at the time of interpretation on 03/30/2021 at 10:02 am to provider [facility] , who verbally acknowledged these results. Electronically Signed   By: Macy Mis M.D.   On: 03/30/2021 10:13    Assessment: 75 y.o. male with hypertension who presented to [facility] ED with left sided weakness and was found to have a right PCA occlusion complicated by subsequent development of subarachnoid ([employer] Grade 1) and intraparenchymal hemorrhaging after receiving tPA. Now status post TXA administration. He was subsequently transferred to [facility] for ongoing evaluation and monitoring.  - Exam after arrival at [facility] is unchanged relative to prior exam at [facility] - Stroke Risk Factors - hypertension, hyperlipidemia, prior TIA, history of smoking  03/30/21 CT head w/o: negative for acute infarct or hemorrhage. ASPECTS 10 03/30/21 CTA head/neck: stenosis of left A2 ACA, congenitally small caliber right vertebral artery. 03/30/21 CT head w/o: small volume subarachnoid hemorrhage post tPA. Puntuate foci of calcification in the right posterior temporal, right posterior sylvian fissue, right occipital, and right perimesencephalic cistern suggestive of calcific emboli.  03/30/21 CTA head/neck: occlusion of P3 PCA branch. 03/30/21 CT head w/o: 3.6x3.1x1.9cm acute parenchymal hemorrhage within the posterior right temporal lobe. Small volume acute subarachnoid hemorrhage   Plan: HgbA1c, fasting lipid panel STAT CT head MRI brain Repeat a Stat CT head for any acute neurological changes Echocardiogram with bubble study Monitor fibrinogen, repeat level drawn STAT after arrival to [facility] TXA was administered at [facility] the [facility] [place] and subarachnoid hemorrhage No antiplatelet medications or anticoagulants.  DVT prophylaxis with SCDs Blood pressure goal: SBP 120-178mHg Cleviprex  drip for BP management.  Frequent neuro checks Tele monitoring NPO  PT/OT/ST consults Signed out to Dr. ARory Percy Addendun: - STAT CT preliminary review: New intraventricular blood seen in the artium of the right lateral ventricle. There is emergence of hypodensity in the right occipital lobe, which corresponds to the left homonymous hemianopsia seen on exam.  - Repeat CT head ordered for 0200 - Will notify Dr. ARory Percy who is covering Neurology from 7 PM to 7[place] MD Internal Medicine Resident PGY-3 [facility] Pager: #[phone]/29/2022 6:24 PM     I have seen and examined the patient. I have formulated the assessment and plan and discussed with Dr. CDarrick Meigs 75y.o. male with hypertension [facility] and was found to have a right PCA occlusion complicated by subsequent development of subarachnoid (Hunt [employer] Hess Grade 1) and intraparenchymal hemorrhaging after receiving tPA. Now status post TXA administration.  Suspect the area of infarction around the M3 MCA branch is secondary to vasospasm. [facility] Repeat exam is stable. Assessment and plan as above. Patient discussed with and signed out to Dr. ARory Percy  Electronically signed: Dr. EKerney Elbe

## 2021-03-30 NOTE — ED Notes (Signed)
Rate changed to '8mg'$ , 46m of Cleviprex

## 2021-03-30 NOTE — ED Notes (Signed)
Dr. Maurine Minister wants BP to be between [id number] and [id number] systolic. Pt in with pt and family giving update at this time.

## 2021-03-30 NOTE — ED Notes (Signed)
Given verbal order to keep BP between [id number] stytolically.

## 2021-03-30 NOTE — ED Notes (Signed)
Geoffrey Wells for update on bed  still waiting assignment

## 2021-03-30 NOTE — ED Notes (Signed)
Pt complaining of continued headache. Neurologist made aware.Repeat non con head CT ordered, pt taken to CT, family at bedside aware

## 2021-03-30 NOTE — ED Notes (Signed)
Called code stroke to [place] at [place]

## 2021-03-30 NOTE — ED Provider Notes (Signed)
I assumed care of this patient approximately 1500.  Please see all providers note for full details regarding patient's initial evaluation assessment.  In brief patient presented for weakness and was given tPA with concern for ischemic stroke.  Unfortunately he developed a post tPA hemorrhage.  He has been accepted at [facility] and is pending transfer.  Reversal medicines for tPA ordered by neurology.  Please see their documentation for full details.  Patient transferred in critical condition to [facility].   Lucrezia Starch, MD 03/30/21 [phone]

## 2021-03-30 NOTE — ED Notes (Signed)
Going to 4N32 [facility] to transport [id number]

## 2021-03-30 NOTE — ED Notes (Signed)
Dr. Maurine Minister ordered BP to be up to 0000000 systolic.

## 2021-03-30 NOTE — ED Notes (Signed)
Pt becoming more restless and continuously yawning. Neurologist at bedside and aware.

## 2021-03-30 NOTE — Progress Notes (Signed)
Hold yale swallow screen and diet for now per Dr. Cheral Marker.

## 2021-03-30 NOTE — ED Notes (Signed)
Neuro MD at bedside. Pt very emotional crying and laughing. Complains of headache, ice pack applied to back of neck. MD aware.

## 2021-03-30 NOTE — Progress Notes (Signed)
Patient arrived to unit at 1814 to 4N ICU. VSS. No complications. Only belongings are a blanket and socks. Dr Cheral Marker notified of patient's arrival.

## 2021-03-30 NOTE — Consult Note (Addendum)
Neurology Consultation Reason for Consult: Code stroke Requesting Physician: Duffy Bruce  CC: Left hand weakness   History is obtained from: Patient, wife at bedside and chart review   HPI: Geoffrey Wells is a 75 y.o. left handed man with a PMHx significant for hypertension, long COVID and remote smoking.  He was in his normal state of health this morning giving a theology talk.  And right while driving home at 6:26 AM he acutely developed left hand weakness.  On presentation to the ED he was evaluated as a code stroke.  Head CT was negative for acute intracranial process and risks and benefits were reviewed with patient and wife with decision to give tPA (start time 9:14 AM, completed at 10:15) for concern for stuttering lacunar stroke.  Initially his symptoms were left facial droop and fluctuating left hand weakness (without drift), left arm ataxia and dysarthria (fluctuating).    On further history he reported he had had a chiropractic neck manipulation the day before and therefore CTA was obtained to assess for dissection.  No dissection was revealed but he was noted to have a very diminutive right vertebral artery with intracranial loss of opacification and no opacification of a right PICA.  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  En route to CTA he was having worsening weakness and dysarthria for which dry head CT was also repeated just prior to CTA, which appeared stable; blood pressure goal was updated to 140-180 to allow for brainstem perfusion in the setting of the significant stenosis.  However he continued to worsen, briefly had a blood pressure in the 190s which improved with a dose of labetalol, and began to complain of a severe headache, with examination notable for brainstem findings of new nystagmus most notable in upgaze (pendular side to side), loss of uvula elevation on the right, and pseudobulbar affect.    Head CT was again repeated which showed trace  subarachnoid hemorrhage on the right near the sylvian fissure.  Blood pressure goal was updated to less than 140, but given high risk of his brainstem stroke progression tPA reversal was not immediately pursued.  On repeat examination at about 11:30 AM he was noted to additionally have left-sided visual field cut as well as some left-sided neglect. Head CT was essentially stable with minimal progression of the [facility], and therefore CTA was repeated to evaluate for potential R MCA occlusion. 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 Case discussed extensively with Dr. Tamera Punt (neuroIR) and Dr. Lavera Guise (Stroke neurology). Given difficulty of approach to R PCA, likely to require stenting of the L vert occlusion followed by traversal of a second stenotic area, as well as SAH which would make DAPT required to prevent in-stent thrombosis very risky with regards to worsening bleeding, decided to defer intervention.   BP goal was increased to 150-170 SBP with improvement of brainstem findings, then reduced to 140-160, with stable R PCA related findings (hemianopia, inability to look to the left), but also improved left sided weakness. Headache was slightly worse but generally stable (at worst patient reported 6/10, which improved to 4/10 and most recently 4.5/10; wife notes he has a very high pain tolerance).  Stability head CT revealed temporal lobe intraparenchymal hemorrhage, after which decision was made to reverse with TXA.  At that time the patient's examination remained stable, NIH of 6 for mild drowsiness, right gaze preference, left hemianopia, left facial droop that is mild, limb ataxia in  the left upper extremity, mild dysarthria (one-point for each finding except 2 points for the hemianopia)  On initial history he reported some chronic intermittent left hand tingling managed by chiropractic manipulation, chronic neck pain managed by chiropractic manipulation (high-speed manipulation, last  performed 7/28), an episode of chest pain and left hand weakness the week prior to presentation which fully resolved, in the setting of increasing in his exercise from 2 miles daily walking to 3 miles daily walking.  He reports prior heavier alcohol use and reports having cut back to no more than 7 drinks per week, and no more than 2 drinks per day.  Denies any other substance use.  Reports chronic shortness of breath that waxes and wanes since his COVID diagnosis in May 2021.  LKW: 7:30 AM on 7/29 tPA given?: No, or if yes, time given 9:13 AM Premorbid modified rankin scale:      0 - No symptoms.  Hunt & Hess score 1: Mild Headache, Alert and Oriented, Minimal (if any) Nuchal Rigidity Patients with Grade I have an approximate 30% mortality.  ROS: All other review of systems was negative except as noted in the HPI.    Past Medical History:  Diagnosis Date   Pneumonia due to COVID-19 virus 01/10/2020   Past Surgical History:  Procedure Laterality Date   HERNIA REPAIR     Current Outpatient Medications  Medication Instructions   ascorbic acid (VITAMIN C) 500 mg, Oral, Daily   aspirin 81 mg, Oral, Every evening   lisinopril ([name]) 10 mg, Oral, Daily   loratadine (CLARITIN) 10 mg, Oral, Daily PRN   melatonin 5 mg, Oral, At bedtime PRN   Multiple Vitamins-Minerals (MULTIVITAMIN WITH MINERALS) tablet 1 tablet, Oral, Daily   vitamin B-12 (CYANOCOBALAMIN) 1,000 mcg, Oral, Daily   Vitamin D3 10,000 Units, Oral, Daily   zinc sulfate 220 mg, Oral, Daily     No family history on file. Negative for stroke, HTN, HLD  Social History:  reports that he has quit smoking. His smoking use included cigarettes. He has a 12.50 pack-year smoking history. He has never used smokeless tobacco. He reports current alcohol use of about 7.0 standard drinks of alcohol per week. He reports previous drug use. To me he reports 1.5 ppd x 30 years (45 pack year hx), quit 30 years ago   Exam: Current vital  signs: BP (!) 159/78   Pulse (!) 59   Resp 16   Wt 99.5 kg   SpO2 96%   BMI 28.95 kg/m  Vital signs in last 24 hours: Pulse Rate:  [59] 59 (07/29 0915) Resp:  [16] 16 (07/29 0915) BP: (159-174)/(78-96) 159/78 (07/29 0915) SpO2:  [96 %] 96 % (07/29 0915) Weight:  [99.5 kg] 99.5 kg (07/29 0908)   Physical Exam  Constitutional: Appears well-developed and well-nourished.  Psych: Affect appropriate to situation initially subsequently pseudobulbar affect  Eyes: No scleral injection HENT: No oropharyngeal obstruction. Moderate burden of caries, filled MSK: no joint deformities.  Cardiovascular: Normal rate and regular rhythm.  Respiratory: Effort normal, non-labored breathing GI: Soft.  No distension. There is no tenderness.  Skin: Warm dry and intact visible skin  Neuro: Mental Status: Patient is awake, alert, oriented to person, place, month, year, and situation. Patient is able to give a clear and coherent history. No signs of aphasia or neglect initially, after SAH had evidence of L-sided neglect, difficulty looking towards the left  Cranial Nerves: II: Visual Fields are full. Pupils are equal, round, and reactive  to light.   III,IV, VI: EOMI without ptosis or diploplia.  V: Facial sensation is symmetric to temperature VII: Facial movement is symmetric.  VIII: hearing is intact to voice X: Uvula elevates symmetrically XI: Shoulder shrug is symmetric. XII: tongue is midline without atrophy or fasciculations.  Motor: Tone is normal. Bulk is normal. 5/5 strength was present in all four extremities initially expect subtle 4++/5 weakness left triceps, wrist extension/flexion and grip, later worsening weakness of the LUE Sensory: Sensation is symmetric to light touch and temperature in the arms and legs initially just 20% loss of temperature in the LUE  Deep Tendon Reflexes: 2+ and symmetric in the biceps and patellae initially  Plantars: Toes are downgoing bilaterally  initially  Cerebellar: FNF and HKS are intact bilaterally initially   NIHSS total 3 initially  Score breakdown: left facial droop, left ataxia, and dysarthria, 1 point each. Hand weakness w/o drift not scored Subsequent NIH     I have reviewed labs in epic and the results pertinent to this consultation are:  Basic Metabolic Panel: Recent Labs  Lab 03/30/21 0858  NA 138  K 4.4  CL 108  CO2 25  GLUCOSE 112*  BUN 20  CREATININE 1.03  CALCIUM 9.1    CBC: Recent Labs  Lab 03/30/21 0858 03/30/21 1616  WBC 5.3 7.2  NEUTROABS 3.2  --   HGB 13.5 13.3  HCT 39.8 39.3  MCV 84.7 85.1  PLT 182 182    Coagulation Studies: Recent Labs    03/30/21 0858 03/30/21 1616  LABPROT 13.5 18.8*  INR 1.0 1.6*      Ref. Range 03/30/2021 11:09 03/30/2021 15:41  Fibrinogen Latest Ref Range: 210 - 475 mg/dL 66 (LL) 73 (LL)   I have reviewed the images obtained:  CT HEAD WO CONTRAST Result Date: 03/30/2021 IMPRESSION: 3.6 x 3.1 x 1.9 cm acute parenchymal hemorrhage within the posterior right temporal lobe, new from the head CT performed earlier today. Small-volume acute subarachnoid hemorrhage along the adjacent right temporal and occipital lobes, mildly increased. No new loss of gray-white differentiation is identified. Stable chronic small vessel ischemic changes within the cerebral white matter. Electronically Signed   By: Kellie Simmering DO   On: 03/30/2021 15:23   CT ANGIO HEAD CODE STROKE  CT ANGIO NECK CODE STROKE Result Date: 03/30/2021 IMPRESSION: Similar small volume sulcal subarachnoid hemorrhage post tPA. Punctate foci of calcification in the right posterior temporal region, right posterior sylvian fissure, right occipital lobe, and right perimesencephalic cistern. Some of these are new since the initial study earlier today reflecting calcific emboli. The right sylvian calcification is along a right M3 MCA branch. The vessel appears patent beyond this point. Right perimesencephalic  calcification is along a right P3 PCA branch, which is newly occluded since the earlier CTA. As before, there is poor visualization of intracranial right vertebral artery reflecting high-grade stenosis or occlusion. These results were called by telephone at the time of interpretation on 03/30/2021 at time of study completion and again at 12:39 pm to provider [facility] , who verbally acknowledged these results. Electronically Signed   By: Macy Mis M.D.   On: 03/30/2021 12:49   CT HEAD WO CONTRAST Result Date: 03/30/2021 IMPRESSION: Similar small volume sulcal subarachnoid hemorrhage post tPA. Punctate foci of calcification in the right posterior temporal region, right posterior sylvian fissure, right occipital lobe, and right perimesencephalic cistern. Some of these are new since the initial study earlier today reflecting calcific emboli. The right sylvian  calcification is along a right M3 MCA branch. The vessel appears patent beyond this point. Right perimesencephalic calcification is along a right P3 PCA branch, which is newly occluded since the earlier CTA. As before, there is poor visualization of intracranial right vertebral artery reflecting high-grade stenosis or occlusion. These results were called by telephone at the time of interpretation on 03/30/2021 at time of study completion and again at 12:39 pm to provider [facility] , who verbally acknowledged these results. Electronically Signed   By: Macy Mis M.D.   On: 03/30/2021 12:49   CT HEAD WO CONTRAST Addendum Date: 03/30/2021   ADDENDUM REPORT: 03/30/2021 11:49 ADDENDUM: Study discussed by telephone with Dr. Lesleigh Noe on 03/30/2021 at 1116 hours. And also I discussed this scan again with Dr. Erlinda Hong at 1145 hours. He notes a new vessel related density in the right sylvian fissure from the two prior noncontrast Head CTs today - see series 5, image 17. And this appears to correspond to the level of a Right MCA posterior M3 branch when  correlated with the earlier sagittal thin CTA images. He advises that the patient continues to have progression of posterior circulation type symptoms. And they may repeat a CTA Head and Neck. If so attention can also be directed to this particular vessel. Electronically Signed   By: Genevie Ann M.D.   On: 03/30/2021 11:49  IMPRESSION: 1. Positive for trace new subarachnoid hemorrhage along the inferior right hemisphere at the junction of the posterior temporal and occipital lobes. No associated mass effect. 2. But no acute cortically based infarct identified and otherwise stable since 0835 hours today. 3. These results were communicated to Dr. Curly Shores at 10:57 am on 03/30/2021 by text page via the [facility] messaging system. Electronically Signed: By: Genevie Ann M.D. On: 03/30/2021 10:57   CT ANGIO HEAD NECK W WO CM (CODE STROKE) Result Date: 03/30/2021 IMPRESSION: No acute intracranial hemorrhage or evidence of acute infarction. 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 Poor visualization of intracranial portion may reflect occlusion or high-grade stenosis. A right PICA if present is not definitely identified. Focal marked stenosis left A2 ACA. No hemodynamically significant stenosis at the ICA origins. Results were reviewed by telephone at the time of interpretation on 03/30/2021 at 10:02 am to provider [facility] , who verbally acknowledged these results. Electronically Signed   By: Macy Mis M.D.   On: 03/30/2021 10:13     CT HEAD CODE STROKE WO CONTRAST IMPRESSION: There is no acute intracranial hemorrhage or evidence of acute infarction. ASPECT score is 10. These results were communicated to Dr. Curly Shores at 8:52 am on 03/30/2021 by text page via the [facility] messaging system. Electronically Signed   By: Macy Mis M.D.   On: 03/30/2021 08:55    Assessment: This is a past 75 year old male with a past medical history of hypertension long COVID as well as remote smoking, who has  been having labile blood pressures and recent transient neurological deficits last week, now presenting initially with slight left-sided weakness, given tPA per protocol, subsequently developing minor subarachnoid hemorrhage as well as new right M3 and right PCA occlusions with management complicated by brainstem hypoperfusion when blood pressure was lowered for subarachnoid management.  Subsequently blood pressure was increased with an improvement in his examination but unfortunately he has had hemorrhagic conversion in the right temporal lobe.  Due to persistent low fibrinogen level, decision was made to give TXA.  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  Recommendations: # Right M3, right PCA embolic strokes, s/p tPA, complicated by subarachnoid hemorrhage, then complicated by intraparenchymal hemorrhage, now s/p TXA - Stroke labs TSH, ESR, RPR, HgbA1c, fasting lipid panel - MRI brain when stabilized, 24 hours post-tPA - Frequent neuro checks, q1hr - Echocardiogram - Hold antiplatelets given ICH - Risk factor modification - Telemetry monitoring - Blood pressure goal   - Multiple adustments made as above, now < 140 - PT consult, OT consult, Speech consult once patient stabilized  - Transfer for admission to stroke team at [facility]  #Goals of care Given patient's excellent functional baseline, family is open to initial aggressive interventions but note that should he have substantial decline without substantial expected recovery he would want to transition  to comfort care  Lesleigh Noe MD-PhD [facility] [phone]  Total critical care time: 330 minutes   Critical care time was exclusive of separately billable procedures and treating other patients.   Critical care was necessary to treat or prevent imminent or life-threatening deterioration.   Critical care was time spent personally by me on the following activities: development of treatment plan with patient and/or surrogate as well as nursing, discussions with consultants/primary team, evaluation of patient's response to treatment, examination of patient, obtaining history from patient or surrogate, ordering and performing treatments and interventions, ordering and review of laboratory studies, ordering and review of radiographic studies, and re-evaluation of patient's condition as needed, as documented above.

## 2021-03-30 NOTE — ED Notes (Signed)
Per neuro BP goal less than 140. When BP stable and in range transport to CT for repeat scan.

## 2021-03-30 NOTE — Progress Notes (Signed)
CODE STROKE- PHARMACY COMMUNICATION   Time CODE STROKE called/page received: [phone]  Time response to CODE STROKE was made (in person or via phone): Immediately  Time Stroke Kit retrieved from Pyxis: Already available in room. Patient endorses no longer taking apixaban. Mixing started after consent obtained.  Name of Provider contacted: Dr. Monico Hoar 03/30/2021  9:23 AM

## 2021-03-30 NOTE — Plan of Care (Signed)
Discussed further tPA reversal options with the ED pharmacist, Josh. Given the new interventricular bleeding noted on repeat CT this evening, despite receiving TXA earlier today, he recommended 10U cryoprecipitate.  I discussed the CT findings with pt and his wife via telephone. After review of risks and benefits of cryoprecipitate administration, consent was obtained.   -check fibrinogen level now, and then post transfusion -transfuse 2 pools (10U) cryoprecipitate.   Mitzi Hansen, MD Internal Medicine Resident PGY-3 [facility] Pager: [phone] 03/30/2021 8:59 PM

## 2021-03-30 NOTE — ED Notes (Signed)
Critical fibrinogen 66. MD made aware.

## 2021-03-31 ENCOUNTER — Inpatient Hospital Stay (HOSPITAL_COMMUNITY): Payer: No Typology Code available for payment source

## 2021-03-31 DIAGNOSIS — I6389 Other cerebral infarction: Secondary | ICD-10-CM

## 2021-03-31 LAB — ECHOCARDIOGRAM COMPLETE
Area-P 1/2: 2.5 cm2
Calc EF: 64.4 %
P 1/2 time: 436 msec
S' Lateral: 3.6 cm
Single Plane A2C EF: 71.4 %
Single Plane A4C EF: 59 %
Weight: 3510.4 oz

## 2021-03-31 LAB — BPAM CRYOPRECIPITATE
Blood Product Expiration Date: 202207300332
Blood Product Expiration Date: 202207300332
ISSUE DATE / TIME: 202207292202
ISSUE DATE / TIME: 202207292303
Unit Type and Rh: 5100
Unit Type and Rh: 5100

## 2021-03-31 LAB — PREPARE CRYOPRECIPITATE
Unit division: 0
Unit division: 0

## 2021-03-31 LAB — FIBRINOGEN: Fibrinogen: 132 mg/dL — ABNORMAL LOW (ref 210–475)

## 2021-03-31 MED ORDER — ATORVASTATIN CALCIUM 80 MG PO TABS
80.0000 mg | ORAL_TABLET | Freq: Every day | ORAL | Status: DC
  Administered 2021-03-31 – 2021-04-03 (×4): 80 mg via ORAL
  Filled 2021-03-31 (×4): qty 1

## 2021-03-31 NOTE — Progress Notes (Signed)
Inpatient Rehab Admissions Coordinator Note:   Per therapy recommendations, pt was screened for CIR candidacy by Alleene Stoy, MS CCC-SLP. At this time, Pt. Appears to have functional decline and is a good candidate for CIR. Will place order for rehab consult per protocol.  Please contact me with questions.   Leeana Creer, MS, CCC-SLP Rehab Admissions Coordinator  [phone] (celll) [phone] (office)  

## 2021-03-31 NOTE — Progress Notes (Signed)
  Echocardiogram 2D Echocardiogram has been performed.  Bobbye Charleston 03/31/2021, 8:56 AM

## 2021-03-31 NOTE — Progress Notes (Signed)
Pt was unable to tolerate MRI, obtained CT 6 hr follow-up which he tolerated well. Pt with c/o back discomfort and some HA; refuses to take Tylenol suppository, pt to remain NPO and no swallow study to be done at this time.

## 2021-03-31 NOTE — Evaluation (Signed)
Clinical/Bedside Swallow Evaluation Patient Details  Name: Isamu Gundy MRN: [id number] Date of Birth: 02-24-1946  Today's Date: 03/31/2021 Time: SLP Start Time (ACUTE ONLY): 85 SLP Stop Time (ACUTE ONLY): 1150 SLP Time Calculation (min) (ACUTE ONLY): 20 min  Past Medical History:  Past Medical History:  Diagnosis Date   Pneumonia due to COVID-19 virus 01/10/2020   Past Surgical History:  Past Surgical History:  Procedure Laterality Date   HERNIA REPAIR     HPI:  75 yo male s/p admitted to [facility] with L hand weakness and L facial droop.  He is  s/p tPA with worsening of symptoms  post admistration.  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 Received reversal medication. PMHx: COVID PNA with ongoing issues with SHOB.  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  Patient reports seeing ST for services at the [facility] to address swallowing defiicts post treatment.   Assessment / Plan / Recommendation Clinical Impression  Clinical swallowing evaluation was completed using ice chips, thin liquids via spoon, cup and straw and pureed material.  Per the patient and his wife who was present he has a history of throat cancer and is s/p chemo/XRT for treatment about 7 years ago or so during whic time he was followed by ST services at the [place].  He reported ongoing issues with dryness and issues getting down dry material as well as occassional strangling on things.  He stated use of liquids as strategy to assist with clearing dry material.  Cranial nerve exam was completed and remarkable to left sided facial weakness encompassing the entire side of his left face including his eye.  Lingual and jaw range of motion was adequate.  However, lingual strength on the left was reduced.  Facial sensation appeared to be intact and he did not endorse a difference in  facial sensation from the left to right side of his face.  He presented with a possible pharyngeal dysphagia.  Swallow trigger was appreciated to palpation.  He was noted to have consistent immediate or delayed throat clear on ice chips and thin liquids.  Throat clear/cough was not seen given pureed material.  He was observed taking medication whole in pureed material without obvious s/s of aspiration.  Given patient history, reason for admission and clinical presentation recommend he remain NPO except ice chips given diligent oral care using a toothbrush and toothpaste as well as meds whole in pureed mateiral pending results of MBS that will be completed as soon as possible. SLP Visit Diagnosis: Dysphagia, unspecified (R13.10)    Aspiration Risk  Moderate aspiration risk    Diet Recommendation   NPO except ice chips  Medication Administration: Whole meds with puree    Other  Recommendations Oral Care Recommendations: Oral care QID   Follow up Recommendations Other (comment) (TBD)      Frequency and Duration min 2x/week  2 weeks       Prognosis Prognosis for Safe Diet Advancement: Good      Swallow Study   General Date of Onset: 03/30/21 HPI: 75 yo male s/p admitted to [facility] with L hand weakness and L facial droop.  He is  s/p tPA with worsening of symptoms  post admistration.  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 Received  reversal medication. PMHx: COVID PNA with ongoing issues with SHOB.  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  Patient reports seeing ST for services at the [facility] to address swallowing defiicts post treatment. Type of Study: Bedside Swallow Evaluation Previous Swallow Assessment: None at [facility] but patient was active with SLP at [facility] following treatment for throat cancer many years ago. Diet Prior to this  Study: NPO Temperature Spikes Noted: No History of Recent Intubation: No Oral Cavity Assessment: Dry Oral Care Completed by SLP: No Oral Cavity - Dentition: Adequate natural dentition Vision: Impaired for self-feeding Self-Feeding Abilities: Needs assist Patient Positioning: Upright in bed Baseline Vocal Quality: Low vocal intensity Volitional Swallow: Able to elicit    Oral/Motor/Sensory Function Overall Oral Motor/Sensory Function: Mild impairment Facial ROM: Reduced left Facial Symmetry: Abnormal symmetry left Facial Strength: Reduced left Facial Sensation: Within Functional Limits Lingual ROM: Within Functional Limits Lingual Symmetry: Within Functional Limits Lingual Strength: Reduced Mandible: Within Functional Limits   Ice Chips Ice chips: Impaired Presentation: Spoon Pharyngeal Phase Impairments: Throat Clearing - Immediate   Thin Liquid Thin Liquid: Impaired Presentation: Cup;Spoon;Straw Pharyngeal  Phase Impairments: Throat Clearing - Immediate;Throat Clearing - Delayed    Nectar Thick Nectar Thick Liquid: Not tested   Honey Thick Honey Thick Liquid: Not tested   Puree Puree: Within functional limits Presentation: Spoon   Solid     Solid: Not tested     Shelly Flatten, MA, [facility] SLP [phone]  Lamar Sprinkles 03/31/2021,12:11 PM

## 2021-03-31 NOTE — Evaluation (Signed)
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 Received reversal medication. PMHx: COVID PNA.   Clinical Impression   Pt PTA: Pt was independent, still working. Pt currently with L visual deficits, some L inattention, decreased motor planning on L and decreased ability to care for self. Pt set-upA to modA for ADL and minA _2 for bed mobility and transfers. Pt would benefit from continued OT skilled services. OT following acutely.    Follow Up Recommendations  CIR;Supervision/Assistance - 24 hour    Equipment Recommendations  3 in 1 bedside commode    Recommendations for Other Services Rehab consult     Precautions / Restrictions Precautions Precautions: Fall;Other (comment) Precaution Comments: b/l hemianopia Restrictions Weight Bearing Restrictions: No      Mobility Bed Mobility Overal bed mobility: Needs Assistance Bed Mobility: Rolling;Supine to Sit Rolling: Min guard   Supine to sit: Min assist     General bed mobility comments: MinA for trunk elevation    Transfers Overall transfer level: Needs assistance Equipment used: 2 person hand held assist Transfers: Sit to/from [facility] Sit to Stand: Min assist;+2 physical assistance;+2 safety/equipment Stand pivot transfers: Min assist;+2 physical assistance;+2 safety/equipment       General transfer comment: L inattention and poor L visual field    Balance Overall balance assessment: Needs assistance Sitting-balance support: Bilateral upper extremity supported;Feet supported Sitting balance-Leahy Scale: Fair     Standing balance support: Bilateral upper extremity supported Standing  balance-Leahy Scale: Poor Standing balance comment: sit to stand and pivot to recliner                           ADL either performed or assessed with clinical judgement   ADL Overall ADL's : Needs assistance/impaired Eating/Feeding: Minimal assistance;Sitting Eating/Feeding Details (indicate cue type and reason): cues for food on L side of plate Grooming: Minimal assistance;Sitting   Upper Body Bathing: Moderate assistance;Sitting Upper Body Bathing Details (indicate cue type and reason): IV in arm, wrapped for protection Lower Body Bathing: Moderate assistance;Sitting/lateral leans;Sit to/from stand;Cueing for safety;Cueing for sequencing   Upper Body Dressing : Minimal assistance;Sitting   Lower Body Dressing: Moderate assistance;Sitting/lateral leans;Sit to/from stand Lower Body Dressing Details (indicate cue type and reason): increased time; decreased ability in LUE to manipulate sock in hand Toilet Transfer: Minimal assistance;+2 for physical assistance;+2 for safety/equipment;Stand-pivot;BSC Toilet Transfer Details (indicate cue type and reason): simulated to recliner Toileting- Clothing Manipulation and Hygiene: Moderate assistance;+2 for physical assistance;+2 for safety/equipment;Sitting/lateral lean;Sit to/from stand;Cueing for sequencing;Cueing for safety       Functional mobility during ADLs: Minimal assistance;+2 for physical assistance;+2 for safety/equipment;Cueing for safety;Cueing for sequencing General ADL Comments: Pt limited by L visual deficits appears to be in bilateral eyes not able to scan past midline to L side (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 Pt requires assist for ADL tasks as L incoordation and poor awareness of deficits on L side.     Vision Baseline Vision/History: Wears glasses Wears Glasses: Distance only;At all times Patient Visual Report: Other (comment) (b/l hemianopsia?) Vision Assessment?: Vision impaired- to be further tested in  functional context;Yes Eye Alignment: Within Functional Limits Ocular Range of Motion: Restricted on the left;Impaired-to be  further tested in functional context Alignment/Gaze Preference: Other (comment) (gaze to R or midline) Tracking/Visual Pursuits: Right eye does not track medially;Left eye does not track laterally;Impaired - to be further tested in functional context Additional Comments: appears to be have?b/l left hemianopia     Perception Perception Perception Tested?: Yes Perception Deficits: Inattention/neglect;Figure ground Inattention/Neglect: Does not attend to left visual field;Impaired- to be further tested in functional context   Praxis      Pertinent Vitals/Pain Pain Assessment: Faces Faces Pain Scale: Hurts a little bit Pain Location: neck and low back Pain Descriptors / Indicators: Discomfort;Sore Pain Intervention(s): Monitored during session;Premedicated before session;Repositioned     Hand Dominance Left   Extremity/Trunk Assessment Upper Extremity Assessment Upper Extremity Assessment: RUE deficits/detail;LUE deficits/detail RUE Deficits / Details: strength 4/5 RUE Sensation: WNL LUE Deficits / Details: strength 4/5; poor coordination; poor figure ground and apraxia; sensation intact LUE Sensation: decreased proprioception LUE Coordination: decreased fine motor;decreased gross motor   Lower Extremity Assessment Lower Extremity Assessment: Defer to PT evaluation;Overall [facility] for tasks assessed   Cervical / Trunk Assessment Cervical / Trunk Assessment: Normal   Communication Communication Communication: HOH;Expressive difficulties (slurring a of speech)   Cognition Arousal/Alertness: Awake/alert Behavior During Therapy: Flat affect Overall Cognitive Status: Impaired/Different from baseline Area of Impairment: Orientation;Attention;Memory;Following commands;Safety/judgement;Awareness;Problem solving                 Orientation Level:  Disoriented to;Time Current Attention Level: Selective Memory: Decreased short-term memory Following Commands: Follows one step commands with increased time Safety/Judgement: Decreased awareness of deficits;Decreased awareness of safety Awareness: Intellectual Problem Solving: Slow processing;Difficulty sequencing;Requires tactile cues General Comments: Spouse reports pt has COVID fog. Pt requiring increased time. Pt with L sided visual deficits with L inattention to tasks. Pt difficulty reading words on L side of  page read "fit" in "profit." Pt following all simple commands with increased time.   General Comments  Pt's oldest daughter, Joelene Millin and spouse, [name] in room. Education provided to stand on R side for importance info and encourage pt to look L for everyday info. Periods of rest and wakefulness for brain to heal.    Exercises     Shoulder Instructions      Home Living Family/patient expects to be discharged to:: Private residence Living Arrangements: Spouse/significant other Available Help at Discharge: Family;Available PRN/intermittently Type of Home: House Home Access: Stairs to enter [employer] of Steps: 5 Entrance Stairs-Rails: Right Home Layout: One level     Bathroom Shower/Tub: Teacher, early years/pre: Standard     Home Equipment: Grab bars - tub/shower;Cane - single point;Walker - 2 wheels   Additional Comments: Pt's spouse provided PLOF info as pt was very distracted.      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        OT Problem List: Decreased strength;Decreased activity tolerance;Impaired balance (sitting and/or standing);Decreased coordination;Impaired vision/perception;Decreased cognition;Decreased safety awareness;Impaired UE functional use;Pain;Increased edema;Impaired sensation      OT  Treatment/Interventions: Self-care/ADL training;Therapeutic exercise;Neuromuscular education;Energy conservation;Therapeutic activities;Cognitive remediation/compensation;Visual/perceptual remediation/compensation;Patient/family education;Balance training    OT Goals(Current goals can be found in the care plan section) Acute Rehab OT Goals Patient Stated Goal: to go to rehab OT Goal Formulation: With patient/family Time For Goal Achievement: 04/14/21 Potential to Achieve Goals: Good ADL Goals Pt Will Perform Eating: with set-up;sitting Pt Will Perform Grooming: with min guard assist;with set-up;sitting  Pt Will Transfer to Toilet: with min guard assist;ambulating;bedside commode Additional ADL Goal #1: Pt will attend to L side 25% of session with minimal cues to turn to L. Additional ADL Goal #2: Pt will follow multi-step commands 50% of the time in 3/4 trials.  OT Frequency: Min 2X/week   Barriers to D/C:            Co-evaluation PT/OT/SLP Co-Evaluation/Treatment: Yes Reason for Co-Treatment: Complexity of the patient's impairments (multi-system involvement);To address functional/ADL transfers;For patient/therapist safety   OT goals addressed during session: ADL's and self-care;Strengthening/ROM      AM-PAC OT "6 Clicks" Daily Activity     Outcome Measure Help from another person eating meals?: A Little Help from another person taking care of personal grooming?: A Little Help from another person toileting, which includes using toliet, bedpan, or urinal?: A Lot Help from another person bathing (including washing, rinsing, drying)?: A Lot Help from another person to put on and taking off regular upper body clothing?: A Little Help from another person to put on and taking off regular lower body clothing?: A Lot 6 Click Score: 15   End of Session Equipment Utilized During Treatment: Gait belt Nurse Communication: Mobility status  Activity Tolerance: Patient tolerated treatment  well Patient left: in chair;with call bell/phone within reach;with chair alarm set;with family/visitor present;with nursing/sitter in room  OT Visit Diagnosis: Unsteadiness on feet (R26.81);Muscle weakness (generalized) (M62.81);Pain;Other symptoms and signs involving cognitive function Pain - part of body:  (neck/back)                Time: [id number] OT Time Calculation (min): 37 min Charges:  OT General Charges $OT Visit: 1 Visit OT Evaluation $OT Eval Moderate Complexity: 1 Mod  [name], OTR/L Acute Rehabilitation Services Pager: [phone] Office: [phone]   Audie Pinto 03/31/2021, 10:31 AM

## 2021-03-31 NOTE — Evaluation (Signed)
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 Received reversal medication. PMHx: COVID PNA.  Clinical Impression  PTA, pt lives with his spouse and works part time as a Oceanographer. Pt presents with left visual deficits, inattention, decreased functional use of left upper extremity, balance deficits, difficulty with motor planning, and cognitive impairments. Pt requiring min assist (+2 safety) to progress from bed to chair. Further ambulation deferred in setting of nausea. Suspect good progress given PLOF, motivation and family support. Recommend CIR to address deficits and maximize functional independence.     Follow Up Recommendations CIR;Supervision/Assistance - 24 hour    Equipment Recommendations  None recommended by PT    Recommendations for Other Services Rehab consult     Precautions / Restrictions Precautions Precautions: Fall;Other (comment) Precaution Comments: b/l hemianopia, BP 120-140 Restrictions Weight Bearing Restrictions: No      Mobility  Bed Mobility Overal bed mobility: Needs Assistance Bed Mobility: Supine to Sit Rolling: Min guard   Supine to sit: Min assist     General bed mobility comments: Light assist for initiation of BLE's off edge of bed    Transfers Overall transfer level: Needs assistance Equipment used: 2 person hand held assist Transfers: Sit to/from [facility] Sit to Stand: Min guard Stand pivot transfers: Min assist;+2 physical assistance;+2 safety/equipment       General transfer comment: L inattention, visual deficits; benefits from auditory cues for locating recliner. MinA + 2 for safety with navigation from bed to  chair and assist for balance  Ambulation/Gait                Stairs            Wheelchair Mobility    Modified Rankin (Stroke Patients Only) Modified Rankin (Stroke Patients Only) Pre-Morbid Rankin Score: No significant disability Modified Rankin: Moderately severe disability     Balance Overall balance assessment: Needs assistance Sitting-balance support: Feet supported Sitting balance-Leahy Scale: Fair     Standing balance support: Bilateral upper extremity supported Standing balance-Leahy Scale: Poor Standing balance comment: handheld support                             Pertinent Vitals/Pain Pain Assessment: Faces Faces Pain Scale: Hurts a little bit Pain Location: neck and low back Pain Descriptors / Indicators: Discomfort;Sore Pain Intervention(s): Monitored during session    Home Living Family/patient expects to be discharged to:: Private residence Living Arrangements: Spouse/significant other Available Help at Discharge: Family;Available PRN/intermittently Type of Home: House Home Access: Stairs to enter Entrance Stairs-Rails: Right Entrance Stairs-Number of Steps: 5 Home Layout: One level Home Equipment: Grab bars - tub/shower;Cane - single point;Walker - 2 wheels Additional Comments: Pt's spouse provided PLOF info as pt was very distracted.    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     Hand Dominance   Dominant Hand: Left    Extremity/Trunk Assessment   Upper Extremity Assessment Upper Extremity Assessment: RUE deficits/detail;LUE deficits/detail RUE Deficits / Details: strength 4/5 RUE Sensation: WNL LUE Deficits / Details: strength 4/5; poor  coordination; poor figure ground and apraxia; sensation intact LUE Sensation: decreased proprioception LUE Coordination: decreased fine motor;decreased gross motor     Lower Extremity Assessment Lower Extremity Assessment: Overall WFL for tasks assessed    Cervical / Trunk Assessment Cervical / Trunk Assessment: Normal  Communication   Communication: HOH;Expressive difficulties (slurring a of speech)  Cognition Arousal/Alertness: Awake/alert Behavior During Therapy: Flat affect Overall Cognitive Status: Impaired/Different from baseline Area of Impairment: Orientation;Attention;Memory;Following commands;Safety/judgement;Awareness;Problem solving                 Orientation Level: Disoriented to;Time Current Attention Level: Selective Memory: Decreased short-term memory Following Commands: Follows one step commands with increased time Safety/Judgement: Decreased awareness of deficits;Decreased awareness of safety Awareness: Intellectual Problem Solving: Slow processing;Difficulty sequencing;Requires tactile cues General Comments: Spouse reports pt has COVID fog. Pt requiring increased time. Pt with L sided visual deficits with L inattention to tasks. Pt difficulty reading words on L side of  page read "fit" in "profit." Pt following all simple commands with increased time.      General Comments General comments (skin integrity, edema, etc.): BP 115/82 supine, 106/73 sitting in chair. 82 HR, 96% SpO2 on RA    Exercises     Assessment/Plan    PT Assessment Patient needs continued PT services  PT Problem List Decreased strength;Decreased activity tolerance;Decreased balance;Decreased mobility;Decreased coordination;Decreased cognition;Decreased safety awareness       PT Treatment Interventions DME instruction;Gait training;Stair training;Functional mobility training;Therapeutic activities;Therapeutic exercise;Balance training;Patient/family education    PT Goals (Current goals can be found in the Care Plan section)  Acute Rehab PT Goals Patient Stated Goal: to go to rehab PT Goal Formulation: With patient/family Time For Goal  Achievement: 04/14/21 Potential to Achieve Goals: Good    Frequency Min 4X/week   Barriers to discharge        Co-evaluation PT/OT/SLP Co-Evaluation/Treatment: Yes Reason for Co-Treatment: Complexity of the patient's impairments (multi-system involvement);To address functional/ADL transfers PT goals addressed during session: Mobility/safety with mobility OT goals addressed during session: ADL's and self-care;Strengthening/ROM       AM-PAC PT "6 Clicks" Mobility  Outcome Measure Help needed turning from your back to your side while in a flat bed without using bedrails?: A Little Help needed moving from lying on your back to sitting on the side of a flat bed without using bedrails?: A Little Help needed moving to and from a bed to a chair (including a wheelchair)?: A Little Help needed standing up from a chair using your arms (e.g., wheelchair or bedside chair)?: A Little Help needed to walk in hospital room?: A Lot Help needed climbing 3-5 steps with a railing? : A Lot 6 Click Score: 16    End of Session Equipment Utilized During Treatment: Gait belt Activity Tolerance: Patient tolerated treatment well Patient left: in chair;with call bell/phone within reach;with chair alarm set;with family/visitor present Nurse Communication: Mobility status PT Visit Diagnosis: Unsteadiness on feet (R26.81);Difficulty in walking, not elsewhere classified (R26.2)    Time: [id number] PT Time Calculation (min) (ACUTE ONLY): 37 min   Charges:   PT Evaluation $PT Eval Moderate Complexity: 1 Mod          Wyona Almas, PT, DPT Acute Rehabilitation Services Pager [phone] Office [phone]   Deno Etienne 03/31/2021, 11:21 AM

## 2021-03-31 NOTE — Progress Notes (Signed)
PT Cancellation Note  Patient Details Name: Geoffrey Wells MRN: [id number] DOB: 1946-01-27   Cancelled Treatment:    Reason Eval/Treat Not Completed: Active bedrest order  Wyona Almas, PT, DPT Acute Rehabilitation Services Pager [phone] Office [phone]    Deno Etienne 03/31/2021, 8:02 AM

## 2021-03-31 NOTE — Progress Notes (Addendum)
STROKE TEAM PROGRESS NOTE   INTERVAL HISTORY His family members are at the bedside.  They have many questions and concerns. We spent an extended time with the family discussing pt situation, treatment plan and reviewed imaging. The pt is doing better than expected, but made family aware that the following 24-48h could quickly change and we will need to continue to closely monitor him in the ICU.   Vitals:   03/31/21 0815 03/31/21 0830 03/31/21 0845 03/31/21 0900  BP: 123/75 134/63 132/73 136/67  Pulse: 72 69 76 72  Resp: '18 17 19 15  '$ Temp:      TempSrc:      SpO2: 96% 92% 94% 95%   CBC:  Recent Labs  Lab 03/30/21 0858 03/30/21 1616 03/30/21 2029  WBC 5.3 7.2 7.0  NEUTROABS 3.2  --   --   HGB 13.5 13.3 13.6  HCT 39.8 39.3 41.1  MCV 84.7 85.1 84.4  PLT 182 182 [id number]   Basic Metabolic Panel:  Recent Labs  Lab 03/30/21 0858  NA 138  K 4.4  CL 108  CO2 25  GLUCOSE 112*  BUN 20  CREATININE 1.03  CALCIUM 9.1   Lipid Panel:  Recent Labs  Lab 03/30/21 2029  CHOL 269*  TRIG 89  HDL 67  CHOLHDL 4.0  VLDL 18  LDLCALC 184*   HgbA1c:  Recent Labs  Lab 03/30/21 2029  HGBA1C 5.8*   Urine Drug Screen: No results for input(s): LABOPIA, COCAINSCRNUR, LABBENZ, AMPHETMU, THCU, LABBARB in the last 168 hours.  Alcohol Level No results for input(s): ETH in the last 168 hours.  IMAGING past 24 hours CT HEAD WO CONTRAST  Result Date: 03/31/2021 CLINICAL DATA:  Follow-up examination for cerebral hemorrhage. EXAM: CT HEAD WITHOUT CONTRAST TECHNIQUE: Contiguous axial images were obtained from the base of the skull through the vertex without intravenous contrast. COMPARISON:  Prior MRI from earlier the same day as well as previous CTs from 03/30/2021. FINDINGS: Brain: Intraparenchymal hematoma positioned at the right temporal lobe again seen, relatively similar in size and morphology as compared to prior. Mild localized edema is relatively similar without significant regional mass  effect. Scattered small volume subarachnoid hemorrhage within the adjacent right temporal occipital region again noted, slightly decreased in conspicuity from prior. Four-5 mm intraparenchymal hemorrhage seen superiorly within the right parietal lobe again noted (series 3, image 29). Intraventricular extension with small volume blood within the occipital horns of both lateral ventricles, slightly decreased from prior. Overall ventricular size is relatively stable without overt hydrocephalus or trapping. Underlying scattered ischemic infarcts involving the right cerebral hemisphere noted, most pronounced at the posterior right frontal region/precentral gyrus and right occipital lobe. Changes better evaluated on recent MRI. Additional subcentimeter left-sided infarct noted on prior MRI not visible by CT. No other new large vessel territory infarct. No mass lesion or extra-axial fluid collection. Underlying atrophy with chronic microvascular ischemic disease again noted. Vascular: No hyperdense vessel. Skull: Scalp soft tissues and calvarium are stable and within normal limits. Sinuses/Orbits: Right gaze noted. Globes and orbital soft tissues demonstrate no other acute finding. Scattered mucosal thickening noted within the ethmoidal air cells. Paranasal sinuses are otherwise clear. No significant mastoid effusion. Other: None. IMPRESSION: 1. No significant interval change in size and morphology of right temporal lobe intraparenchymal hematoma. Similar mild localized edema without significant regional mass effect. 2. Slight interval decrease in conspicuity of scattered small volume subarachnoid hemorrhage within the adjacent right temporoccipital region. 3. Slight interval decrease in volume  of intraventricular hemorrhage with stable ventricular size. 4. Scattered ischemic infarcts involving the right cerebral hemisphere, better evaluated on recent MRI. Additional subcentimeter left-sided infarct noted on prior MRI not  visible by CT. 5. No other new acute intracranial abnormality. Electronically Signed   By: Jeannine Boga M.D.   On: 03/31/2021 05:25   CT HEAD WO CONTRAST  Result Date: 03/30/2021 CLINICAL DATA:  Cerebral hemorrhage suspected. EXAM: CT HEAD WITHOUT CONTRAST TECHNIQUE: Contiguous axial images were obtained from the base of the skull through the vertex without intravenous contrast. COMPARISON:  Head CT performed earlier today 03/30/2021. FINDINGS: Brain: Mild generalized cerebral atrophy. Since the head CT examination performed earlier today at 2:53 p.m., there has been an interval increase in size of an acute parenchymal hemorrhage within the right temporal lobe, now measuring 4.0 x 4.1 x 1.8 cm (previously measuring 3.6 x 3.1 x 1.9 cm). There is now intraventricular extension with small-to-moderate volume hemorrhage within the right lateral ventricle, and small-volume hemorrhage within the occipital horn of the left lateral ventricle. No evidence of hydrocephalus at this time. Small-volume acute subarachnoid hemorrhage overlying the right temporal and occipital lobes has not significantly changed. New 3 mm acute parenchymal hemorrhage or focus of subarachnoid hemorrhage along the high right frontoparietal lobes (series 2, image 29). Interval demarcation of an acute cortical/subcortical infarct within the right occipital lobe within the right PCA territory. This measures 3.3 x 2.8 cm in transaxial dimensions (for instance as seen on series 2, image 11). No midline shift. Vascular: Redemonstrated punctate calcific foci along the right cerebral hemisphere in the right MCA and right PCA territories, likely reflecting calcified emboli. Skull: No calvarial fracture. Redemonstrated nonspecific thinning of the left parietal calvarium. Sinuses/Orbits: Visualized orbits show no acute finding. Mild mucosal thickening within a posterior right ethmoid air cell. These results were called by telephone at the time of  interpretation on 03/30/2021 at 8:16 pm to provider Dr. Rory Percy, who verbally acknowledged these results. IMPRESSION: Interval increase in size of an acute parenchymal hemorrhage within the right temporal lobe, now measuring 4.0 x 4.1 x 1.8 cm. Interval extension of hemorrhage into the ventricular system with small-to-moderate volume hemorrhage in the right lateral ventricle, and small-volume hemorrhage in the occipital horn of the left lateral ventricle. No evidence of hydrocephalus at this time. Small-volume subarachnoid hemorrhage overlying the right temporal and occipital lobes has not significantly changed. New 3 mm acute parenchymal hemorrhage versus focus of subarachnoid hemorrhage along the high right frontoparietal lobes. Interval demarcation of an acute cortical/subcortical infarct within the right occipital lobe (PCA vascular territory), measuring 3.3 x 2.8 cm in transaxial dimensions. Electronically Signed   By: Kellie Simmering DO   On: 03/30/2021 20:26   CT HEAD WO CONTRAST  Result Date: 03/30/2021 CLINICAL DATA:  Stroke, follow-up; SAH monitoring. Additional history provided: TPA follow-up. EXAM: CT HEAD WITHOUT CONTRAST TECHNIQUE: Contiguous axial images were obtained from the base of the skull through the vertex without intravenous contrast. COMPARISON:  CT angiogram head/neck 03/30/2021. Noncontrast head CT examinations 03/30/2021 and earlier. FINDINGS: Brain: Mild generalized cerebral atrophy. New from the head CT performed earlier today there is a parenchymal hemorrhage within the posterior right temporal lobe measuring 3.6 x 3.1 x 1.9 cm. Small volume subarachnoid hemorrhage along the adjacent right temporal occipital lobes appears slightly increased. No new loss of gray-white differentiation is appreciated. Stable chronic small vessel ischemic changes within the cerebral white matter. No evidence of hydrocephalus.  No midline shift. Vascular: Atherosclerotic calcifications. Redemonstrated punctate  foci of calcification along the right cerebral hemisphere within the right MCA and PCA vascular territories suspicious for calcified emboli. Skull: No calvarial fracture. Redemonstrated nonspecific thinning of the left parietal calvarium. Sinuses/Orbits: Visualized orbits show no acute finding. Partial opacification of a posterior right ethmoid air cell. Background trace mucosal thickening within the bilateral ethmoid sinuses. Mild mucosal thickening within the inferior left maxillary sinus. These results were called by telephone at the time of interpretation on 03/30/2021 at 3:20 pm to provider [facility] , who verbally acknowledged these results. IMPRESSION: 3.6 x 3.1 x 1.9 cm acute parenchymal hemorrhage within the posterior right temporal lobe, new from the head CT performed earlier today. Small-volume acute subarachnoid hemorrhage along the adjacent right temporal and occipital lobes, mildly increased. No new loss of gray-white differentiation is identified. Stable chronic small vessel ischemic changes within the cerebral white matter. Electronically Signed   By: Kellie Simmering DO   On: 03/30/2021 15:23   CT HEAD WO CONTRAST  Result Date: 03/30/2021 CLINICAL DATA:  Neuro deficit, acute, stroke suspected; mental status change after receiving tPA EXAM: CT HEAD WITHOUT CONTRAST CT ANGIOGRAPHY OF THE HEAD AND NECK TECHNIQUE: Contiguous axial images were obtained from the base of the skull through the vertex without intravenous contrast. Multidetector CT imaging of the head and neck was performed using the standard protocol during bolus administration of intravenous contrast. Multiplanar CT image reconstructions and MIPs were obtained to evaluate the vascular anatomy. Carotid stenosis measurements (when applicable) are obtained utilizing NASCET criteria, using the distal internal carotid diameter as the denominator. CONTRAST:  154m OMNIPAQUE IOHEXOL 350 MG/ML SOLN COMPARISON:  None. FINDINGS: CT HEAD Brain:  Small volume sulcal subarachnoid hemorrhage is again identified in the right temporo-occipital region. There is no new loss of gray-white differentiation. Punctate foci of calcification are identified within the right posterior temporal region, right posterior sylvian fissure, right occipital lobe, and right perimesencephalic cistern. Stable findings of chronic microvascular ischemic changes. Ventricle size is stable. Vascular: Residual contrast from prior CTA. Skull: No new finding. Sinuses/Orbits: No acute finding. Other: None. Review of the MIP images confirms the above findings CTA NECK Aortic arch: Mild aneurysmal dilatation of the ascending thoracic aorta similar to 2021 chest CT. Great vessel origins remain patent. Right carotid system: Remains patent. Plaque along the proximal internal carotid causes mild stenosis. Left carotid system: Remains patent. Calcified plaque along the proximal internal carotid causes mild stenosis. Vertebral arteries: Remain patent. Congenitally small right vertebral artery. Skeleton: No new finding. Other neck: No new finding. Upper chest: No new finding. Review of the MIP images confirms the above findings CTA HEAD Anterior circulation: Intracranial internal carotid arteries remain patent. Anterior cerebral arteries are patent. Right M1 and M2 MCA branches are patent. There is a new focus of calcification along a right M3 MCA branch within the sylvian fissure. However, the vessel appears patent beyond this point. Left middle cerebral artery is patent. Posterior circulation: As on the prior study, there is poor visualization of the intracranial right vertebral artery. Left vertebral remains patent with calcified plaque causing mild stenosis. Basilar artery is patent. Proximal right posterior cerebral artery is patent, primarily supplied by a posterior communicating artery. There is new occlusion of a P3 PCA branch. Venous sinuses: Patent as allowed by contrast bolus timing. Review  of the MIP images confirms the above findings IMPRESSION: Similar small volume sulcal subarachnoid hemorrhage post tPA. Punctate foci of calcification in the right posterior temporal region, right posterior sylvian fissure, right occipital  lobe, and right perimesencephalic cistern. Some of these are new since the initial study earlier today reflecting calcific emboli. The right sylvian calcification is along a right M3 MCA branch. The vessel appears patent beyond this point. Right perimesencephalic calcification is along a right P3 PCA branch, which is newly occluded since the earlier CTA. As before, there is poor visualization of intracranial right vertebral artery reflecting high-grade stenosis or occlusion. These results were called by telephone at the time of interpretation on 03/30/2021 at time of study completion and again at 12:39 pm to provider [facility] , who verbally acknowledged these results. Electronically Signed   By: Macy Mis M.D.   On: 03/30/2021 12:49   CT HEAD WO CONTRAST  Addendum Date: 03/30/2021   ADDENDUM REPORT: 03/30/2021 11:49 ADDENDUM: Study discussed by telephone with Dr. Lesleigh Noe on 03/30/2021 at 1116 hours. And also I discussed this scan again with Dr. Erlinda Hong at 1145 hours. He notes a new vessel related density in the right sylvian fissure from the two prior noncontrast Head CTs today - see series 5, image 17. And this appears to correspond to the level of a Right MCA posterior M3 branch when correlated with the earlier sagittal thin CTA images. He advises that the patient continues to have progression of posterior circulation type symptoms. And they may repeat a CTA Head and Neck. If so attention can also be directed to this particular vessel. Electronically Signed   By: Genevie Ann M.D.   On: 03/30/2021 11:49   Result Date: 03/30/2021 CLINICAL DATA:  75 year old male code stroke presentation this morning with worsening NIH scale status post IV tPA. EXAM: CT HEAD WITHOUT  CONTRAST TECHNIQUE: Contiguous axial images were obtained from the base of the skull through the vertex without intravenous contrast. COMPARISON:  Plain head CT 0835 hours today. FINDINGS: Brain: Residual intravascular contrast. There is a trace amount of new subarachnoid density along the junction of the posterior right temporal and occipital lobes compatible with trace subarachnoid hemorrhage. See series 5, image 15 and series 4, image 53. This tracks anteriorly toward the inferior right temporal gyrus. No regional mass effect. No regional cytotoxic edema identified. No other acute intracranial hemorrhage identified. No IVH. No cortically based acute infarct identified. No midline shift, mass effect, or evidence of intracranial mass lesion. No ventriculomegaly. Gray-white matter differentiation appears stable since 0835 hours, patchy white matter hypodensity. Vascular: Calcified atherosclerosis at the skull base. The major intracranial vascular structures appear to remain enhancing. Skull: Negative. Sinuses/Orbits: Visualized paranasal sinuses and mastoids are stable and well aerated. Other: Visualized orbits and scalp soft tissues are within normal limits. IMPRESSION: 1. Positive for trace new subarachnoid hemorrhage along the inferior right hemisphere at the junction of the posterior temporal and occipital lobes. No associated mass effect. 2. But no acute cortically based infarct identified and otherwise stable since 0835 hours today. 3. These results were communicated to Dr. Curly Shores at 10:57 am on 03/30/2021 by text page via the [facility] messaging system. Electronically Signed: By: Genevie Ann M.D. On: 03/30/2021 10:57   MR BRAIN WO CONTRAST  Result Date: 03/31/2021 CLINICAL DATA:  Initial evaluation for acute headache, intracranial hemorrhage. EXAM: MRI HEAD WITHOUT CONTRAST TECHNIQUE: Multiplanar, multiecho pulse sequences of the brain and surrounding structures were obtained without intravenous contrast.  COMPARISON:  Prior CTs from 03/30/21. FINDINGS: Brain: Examination technically limited as the patient was unable to tolerate the full length of the study. Diffusion and sagittal T1 weighted sequences only were performed. Additionally,  provided images are moderately degraded by motion. Multifocal ischemic infarcts involving the right frontal, parietal, temporal, and occipital lobes are seen, right MCA and PCA distributions. Largest areas of involvement seen at the posterior right frontal region at the level of the precentral gyrus (series 3, image 40). Additional moderate size confluent area of involvement at the right occipital lobe, measuring up to 3.5 cm in size. Mild patchy involvement of the right hippocampus. Few additional scattered subcentimeter infarcts noted involving the periventricular and deep white matter. Superimposed intraparenchymal hematoma at the right temporal lobe is grossly similar, measuring approximately 4.1 x 3.2 cm on this limited exam. No visible underlying lesion. Scattered small volume subarachnoid hemorrhage within the posterior right temporal occipital region is grossly similar as well. Known intraventricular extension not well seen on this exam. No significant regional mass effect or midline shift. No hydrocephalus or trapping. Basilar cisterns remain patent. Additional note made of a 7 mm ischemic infarct involving the mesial left occipital lobe (series 3, image 19). No other visible acute intracranial abnormality seen on this limited exam. No visible mass lesion or extra-axial fluid collection. Vascular: Not assessed on this limited study. Skull and upper cervical spine: Craniocervical junction within normal limits. Bone marrow signal intensity normal. No scalp soft tissue abnormality. Sinuses/Orbits: Grossly negative on this limited exam. Other: None. IMPRESSION: 1. Technically limited exam due to patient's inability to tolerate the full length of the study and motion artifact.  Diffusion and sagittal T1 weighted sequences only were performed. 2. Multifocal ischemic infarcts involving the right cerebral hemisphere as above, right MCA and PCA distributions. 3. Grossly similar 4.1 x 3.2 cm intraparenchymal hematoma involving the right temporal lobe. Scattered small volume subarachnoid hemorrhage grossly similar as well. Known intraventricular extension not well seen on this exam. No significant regional mass effect or midline shift. No hydrocephalus or trapping. 4. Additional 7 mm acute ischemic infarct involving the mesial left occipital lobe. Electronically Signed   By: Jeannine Boga M.D.   On: 03/31/2021 03:49   CT ANGIO HEAD CODE STROKE  Result Date: 03/30/2021 CLINICAL DATA:  Neuro deficit, acute, stroke suspected; mental status change after receiving tPA EXAM: CT HEAD WITHOUT CONTRAST CT ANGIOGRAPHY OF THE HEAD AND NECK TECHNIQUE: Contiguous axial images were obtained from the base of the skull through the vertex without intravenous contrast. Multidetector CT imaging of the head and neck was performed using the standard protocol during bolus administration of intravenous contrast. Multiplanar CT image reconstructions and MIPs were obtained to evaluate the vascular anatomy. Carotid stenosis measurements (when applicable) are obtained utilizing NASCET criteria, using the distal internal carotid diameter as the denominator. CONTRAST:  159m OMNIPAQUE IOHEXOL 350 MG/ML SOLN COMPARISON:  None. FINDINGS: CT HEAD Brain: Small volume sulcal subarachnoid hemorrhage is again identified in the right temporo-occipital region. There is no new loss of gray-white differentiation. Punctate foci of calcification are identified within the right posterior temporal region, right posterior sylvian fissure, right occipital lobe, and right perimesencephalic cistern. Stable findings of chronic microvascular ischemic changes. Ventricle size is stable. Vascular: Residual contrast from prior CTA.  Skull: No new finding. Sinuses/Orbits: No acute finding. Other: None. Review of the MIP images confirms the above findings CTA NECK Aortic arch: Mild aneurysmal dilatation of the ascending thoracic aorta similar to 2021 chest CT. Great vessel origins remain patent. Right carotid system: Remains patent. Plaque along the proximal internal carotid causes mild stenosis. Left carotid system: Remains patent. Calcified plaque along the proximal internal carotid causes mild stenosis. Vertebral  arteries: Remain patent. Congenitally small right vertebral artery. Skeleton: No new finding. Other neck: No new finding. Upper chest: No new finding. Review of the MIP images confirms the above findings CTA HEAD Anterior circulation: Intracranial internal carotid arteries remain patent. Anterior cerebral arteries are patent. Right M1 and M2 MCA branches are patent. There is a new focus of calcification along a right M3 MCA branch within the sylvian fissure. However, the vessel appears patent beyond this point. Left middle cerebral artery is patent. Posterior circulation: As on the prior study, there is poor visualization of the intracranial right vertebral artery. Left vertebral remains patent with calcified plaque causing mild stenosis. Basilar artery is patent. Proximal right posterior cerebral artery is patent, primarily supplied by a posterior communicating artery. There is new occlusion of a P3 PCA branch. Venous sinuses: Patent as allowed by contrast bolus timing. Review of the MIP images confirms the above findings IMPRESSION: Similar small volume sulcal subarachnoid hemorrhage post tPA. Punctate foci of calcification in the right posterior temporal region, right posterior sylvian fissure, right occipital lobe, and right perimesencephalic cistern. Some of these are new since the initial study earlier today reflecting calcific emboli. The right sylvian calcification is along a right M3 MCA branch. The vessel appears patent  beyond this point. Right perimesencephalic calcification is along a right P3 PCA branch, which is newly occluded since the earlier CTA. As before, there is poor visualization of intracranial right vertebral artery reflecting high-grade stenosis or occlusion. These results were called by telephone at the time of interpretation on 03/30/2021 at time of study completion and again at 12:39 pm to provider [facility] , who verbally acknowledged these results. Electronically Signed   By: Macy Mis M.D.   On: 03/30/2021 12:49   CT ANGIO NECK CODE STROKE  Result Date: 03/30/2021 CLINICAL DATA:  Neuro deficit, acute, stroke suspected; mental status change after receiving tPA EXAM: CT HEAD WITHOUT CONTRAST CT ANGIOGRAPHY OF THE HEAD AND NECK TECHNIQUE: Contiguous axial images were obtained from the base of the skull through the vertex without intravenous contrast. Multidetector CT imaging of the head and neck was performed using the standard protocol during bolus administration of intravenous contrast. Multiplanar CT image reconstructions and MIPs were obtained to evaluate the vascular anatomy. Carotid stenosis measurements (when applicable) are obtained utilizing NASCET criteria, using the distal internal carotid diameter as the denominator. CONTRAST:  175m OMNIPAQUE IOHEXOL 350 MG/ML SOLN COMPARISON:  None. FINDINGS: CT HEAD Brain: Small volume sulcal subarachnoid hemorrhage is again identified in the right temporo-occipital region. There is no new loss of gray-white differentiation. Punctate foci of calcification are identified within the right posterior temporal region, right posterior sylvian fissure, right occipital lobe, and right perimesencephalic cistern. Stable findings of chronic microvascular ischemic changes. Ventricle size is stable. Vascular: Residual contrast from prior CTA. Skull: No new finding. Sinuses/Orbits: No acute finding. Other: None. Review of the MIP images confirms the above findings  CTA NECK Aortic arch: Mild aneurysmal dilatation of the ascending thoracic aorta similar to 2021 chest CT. Great vessel origins remain patent. Right carotid system: Remains patent. Plaque along the proximal internal carotid causes mild stenosis. Left carotid system: Remains patent. Calcified plaque along the proximal internal carotid causes mild stenosis. Vertebral arteries: Remain patent. Congenitally small right vertebral artery. Skeleton: No new finding. Other neck: No new finding. Upper chest: No new finding. Review of the MIP images confirms the above findings CTA HEAD Anterior circulation: Intracranial internal carotid arteries remain patent. Anterior cerebral arteries  are patent. Right M1 and M2 MCA branches are patent. There is a new focus of calcification along a right M3 MCA branch within the sylvian fissure. However, the vessel appears patent beyond this point. Left middle cerebral artery is patent. Posterior circulation: As on the prior study, there is poor visualization of the intracranial right vertebral artery. Left vertebral remains patent with calcified plaque causing mild stenosis. Basilar artery is patent. Proximal right posterior cerebral artery is patent, primarily supplied by a posterior communicating artery. There is new occlusion of a P3 PCA branch. Venous sinuses: Patent as allowed by contrast bolus timing. Review of the MIP images confirms the above findings IMPRESSION: Similar small volume sulcal subarachnoid hemorrhage post tPA. Punctate foci of calcification in the right posterior temporal region, right posterior sylvian fissure, right occipital lobe, and right perimesencephalic cistern. Some of these are new since the initial study earlier today reflecting calcific emboli. The right sylvian calcification is along a right M3 MCA branch. The vessel appears patent beyond this point. Right perimesencephalic calcification is along a right P3 PCA branch, which is newly occluded since the  earlier CTA. As before, there is poor visualization of intracranial right vertebral artery reflecting high-grade stenosis or occlusion. These results were called by telephone at the time of interpretation on 03/30/2021 at time of study completion and again at 12:39 pm to provider [facility] , who verbally acknowledged these results. Electronically Signed   By: Macy Mis M.D.   On: 03/30/2021 12:49   CT ANGIO HEAD NECK W WO CM (CODE STROKE)  Result Date: 03/30/2021 CLINICAL DATA:  Neuro deficit, acute, stroke suspected EXAM: CT ANGIOGRAPHY HEAD AND NECK TECHNIQUE: Multidetector CT imaging of the head and neck was performed using the standard protocol during bolus administration of intravenous contrast. Multiplanar CT image reconstructions and MIPs were obtained to evaluate the vascular anatomy. Carotid stenosis measurements (when applicable) are obtained utilizing NASCET criteria, using the distal internal carotid diameter as the denominator. CONTRAST:  32m OMNIPAQUE IOHEXOL 350 MG/ML SOLN COMPARISON:  None. FINDINGS: CT HEAD Brain: There is no acute intracranial hemorrhage. No new loss of gray-white differentiation. No change since recent prior study. Vascular: No hyperdense vessel. Skull: No new finding. Sinuses/Orbits: No acute finding. Other: None. Review of the MIP images confirms the above findings CTA NECK Aortic arch: Ascending thoracic aorta is probably dilated but is not well evaluated. Calcified plaque along the arch and patent great vessel origins. No high-grade stenosis of the proximal subclavian arteries. Right carotid system: Patent. There is mixed plaque along the common carotid with minimal stenosis. Calcified plaque along the proximal internal carotid without stenosis. Left carotid system: Patent. Minimal calcified plaque along the common carotid without stenosis. Calcified plaque at the bifurcation and internal carotid origin with less than 50% stenosis. Vertebral arteries: Patent.  Left vertebral artery is dominant. There is mild vessel irregularity secondary to cervical spine osteophytic spurring. Skeleton: Degenerative changes of the cervical spine. Other neck: Unremarkable. Upper chest: Scarring and fibrotic changes in the included upper lungs. Retained secretions/debris within the esophagus. Review of the MIP images confirms the above findings CTA HEAD Anterior circulation: Intracranial internal carotid arteries patent with calcified plaque but no significant stenosis. Anterior and middle cerebral arteries are patent. Focal marked stenosis of left A2 ACA. Mild atherosclerotic irregularity of distal ACA and MCA branches. Posterior circulation: There is poor opacification of the intracranial right vertebral artery. Basilar artery is patent but irregular with mild stenosis. A right PICA is not definitely  identified. Left PICA origin is patent. Small bilateral AICA origins are noted. Superior cerebellar artery origins are patent. Right posterior communicating artery is present and is the primary supply of the patent right posterior cerebral artery. Left posterior cerebral artery is patent. Venous sinuses: Patent as allowed by contrast bolus timing. Review of the MIP images confirms the above findings IMPRESSION: No acute intracranial hemorrhage or evidence of acute infarction. 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 Poor visualization of intracranial portion may reflect occlusion or high-grade stenosis. A right PICA if present is not definitely identified. Focal marked stenosis left A2 ACA. No hemodynamically significant stenosis at the ICA origins. Results were reviewed by telephone at the time of interpretation on 03/30/2021 at 10:02 am to provider [facility] , who verbally acknowledged these results. Electronically Signed   By: Macy Mis M.D.   On: 03/30/2021 10:13    PHYSICAL EXAM General: Appears well-developed. No acute distress Psych: Affect  appropriate to situation Eyes: No scleral injection HENT: No OP obstrucion Head: Normocephalic.  Cardiovascular: Normal rate and regular rhythm.  Respiratory: Effort normal and breath sounds normal to anterior ascultation GI: Soft.  No distension. There is no tenderness.  Skin: WDI    Neurological Examination Mental Status: Sleepy, but alerts easily to voice, oriented, thought content appropriate. Speech fluent without evidence of aphasia, but is moderately dysarthric. Able to follow 3 step commands without difficulty. Cranial Nerves: II: Left VFC noted III,IV, VI: ptosis not present, extra-ocular motions intact bilaterally, pupils equal, round, reactive to light and accommodation V,VII: mild left facial droop, facial light touch sensation normal bilaterally VIII: hearing normal bilaterally IX,X: uvula rises symmetrically XI: bilateral shoulder shrug XII: midline tongue extension Motor: Right : Upper extremity   5/5    Left:     Upper extremity   5/5  Lower extremity   5/5     Lower extremity   5/5 Tone and bulk:normal tone throughout; no atrophy noted Sensory: Pinprick and light touch intact throughout, bilaterally Deep Tendon Reflexes: 2+ and symmetric throughout Plantars: Right: downgoing   Left: downgoing Cerebellar: normal finger-to-nose, normal rapid alternating movements and normal heel-to-shin test Gait: normal gait and station   ASSESSMENT/PLAN Mr. Terrial Mizer is a 75 y.o. male with history of hypertension who presented to [facility] ED with left sided weakness and was found to have a right PCA occlusion complicated by subsequent development of subarachnoid ([employer] Grade 1) and intraparenchymal hemorrhaging after receiving tPA. Now status post TXA and 10U cryoprecipitate administration. He was subsequently transferred to [facility] for ongoing evaluation and monitoring.  Stroke: RMCA Code Stroke CT head No acute abnormality. Small vessel disease. Atrophy. ASPECTS 10.  Post  tPA: small volume ASH Acute parenchymal hemorrhage within posterior right temporal lobe, still small vol SAH. Evolving Right occipital infarct. CTA head & neck stenosis of left A2 ACA, congenitally small caliber right vertebral artery. F/u CTA: occlusion of P3 MRI  Multifocal ischemic infarcts involving the right cerebral. Additional 7 mm acute ischemic infarct involving the mesial left occipital lobe hemisphere, right MCA and PCA distributions. 2D Echo pending LDL 184 HgbA1c 5.8 VTE prophylaxis - SCDs    Diet   Diet NPO time specified   aspirin 81 mg daily prior to admission, now on  none d/t bleed .  Therapy recommendations:  pending Disposition:  pending  Hypertension Home meds:  [name] '20mg'$  Stable Blood pressure goal: SBP 120-12mHg Cleviprex drip for BP management Long-term BP goal  normotensive  Hyperlipidemia Home meds:  none,  LDL 184, goal < 70 Add Lipitor '80mg'$  qhs  High intensity statin  Continue statin at discharge  Diabetes type II no dx HgbA1c 5.8, goal < 7.0 CBGs Recent Labs    03/30/21 0902  GLUCAP 115*    SSI  Other Stroke Risk Factors Advanced Age >/= 37  Former Cigarette smoker  This patient is critically ill due to stroke treated with tPA with post treatment ICH, SAH and reversal agent. He is at significant risk of neurological worsening, death form brain herniation, respiratory failure, heart failure. This patient's care requires constant monitoring of vital signs, hemodynamics, respiratory and cardiac monitoring, review of multiple databases, neurological assessment, discussion with family, other specialists and medical decision making of high complexity. I spent 50 minutes of neurocritical care time in the care of this patient.  Hospital day # 1  Desiree Metzger-Cihelka, ARNP-C, ANVP-BC Pager: [phone]   ATTENDING ATTESTATION:  Dr. Reeves Forth evaluated pt independently, reviewed imaging, chart, labs. Discussed and formulated plan. Please  see NP note above for details.  Lunden Stieber,MD   To contact Stroke Continuity provider, please refer to [email or web address] After hours, contact General Neurology

## 2021-04-01 ENCOUNTER — Inpatient Hospital Stay (HOSPITAL_COMMUNITY): Payer: No Typology Code available for payment source

## 2021-04-01 LAB — BASIC METABOLIC PANEL
Anion gap: 6 (ref 5–15)
BUN: 15 mg/dL (ref 8–23)
CO2: 26 mmol/L (ref 22–32)
Calcium: 8.7 mg/dL — ABNORMAL LOW (ref 8.9–10.3)
Chloride: 106 mmol/L (ref 98–111)
Creatinine, Ser: 1 mg/dL (ref 0.61–1.24)
GFR, Estimated: >60 mL/min (ref >60)
Glucose, Bld: 108 mg/dL — ABNORMAL HIGH (ref 70–99)
Potassium: 3.7 mmol/L (ref 3.5–5.1)
Sodium: 138 mmol/L (ref 135–145)

## 2021-04-01 LAB — CBC
HCT: 37.2 % — ABNORMAL LOW (ref 39.0–52.0)
Hemoglobin: 12.4 g/dL — ABNORMAL LOW (ref 13.0–17.0)
MCH: 28.2 pg (ref 26.0–34.0)
MCHC: 33.3 g/dL (ref 30.0–36.0)
MCV: 84.7 fL (ref 80.0–100.0)
Platelets: 195 10*3/uL (ref 150–400)
RBC: 4.39 MIL/uL (ref 4.22–5.81)
RDW: 16.3 % — ABNORMAL HIGH (ref 11.5–15.5)
WBC: 6 10*3/uL (ref 4.0–10.5)
nRBC: 0 % (ref 0.0–0.2)

## 2021-04-01 MED ORDER — LABETALOL HCL 5 MG/ML IV SOLN
20.0000 mg | INTRAVENOUS | Status: DC | PRN
  Administered 2021-04-01 – 2021-04-04 (×6): 20 mg via INTRAVENOUS
  Filled 2021-04-01 (×6): qty 4

## 2021-04-01 NOTE — PMR Pre-admission (Signed)
PMR Admission Coordinator Pre-Admission Assessment  Patient: Geoffrey Wells is an 75 y.o., male MRN: [id number] DOB: 21-Feb-1946 Height:   Weight:    Insurance Information HMO:     PPO:      PCP:      IPA:      80/20:      OTHER:  PRIMARY: [facility]    Policy#: [id number]      Subscriber: pt CM Name: Theodoro Clock      Phone#: [phone] ext [phone]     Fax#: [phone] or [email or web address]  Pre-Cert#: [id number]   approved for 30 days  Benefits:  Phone #: [phone] option #7     Name: 8/2 Eff. Date: active     Deduct: none      Out of Pocket Max: none      Life Max: none CIR: per [place]      SNF: per [place] Outpatient: per [place]     Co-Pay:  Home Health: per [place]      Co-Pay:  DME: [facility]     Co-Pay:  Providers: [facility] network  SECONDARY:    Development worker, community:       Phone#:   The Engineer, [employer]" for patients in Inpatient Rehabilitation Facilities with attached "Privacy Act [place] Records" was provided and verbally reviewed with: Family  Emergency Contact Information Contact Information     Name Relation Home Work Stoughton, [name] Son   [phone]   Timoteo, Carreiro [facility]   [phone]       Current Medical History  Patient Admitting Diagnosis: CVA  History of Present Illness: Geoffrey Wells is an 75 y.o. male veteran with hypertension, hyperlipidemia, prior TIA (on aspirin), ascending aortic aneurysm, carotid artery stenosis, history of smoking, and history of bilateral PE (2021). He [facility] to [facility] ED on 03/30/2021 for left hand weakness and a code stroke was subsequently called.   On further history he reported he had a chiropractic neck manipulation the day before and therefore CTA was obtained to assess for dissection.  No dissection was revealed but he was noted to have a very diminutive right vertebral artery with intracranial loss of opacification and no opacification of a right PICA.  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  En route to CTA he was having worsening weakness and dysarthria for which dry head CT was also repeated just prior to CTA, which appeared stable; blood pressure goal was updated to 140-180 to allow for brainstem perfusion in the setting of the significant stenosis.  However he continued to worsen, briefly had a blood pressure in the 190s which improved with a dose of labetalol, and began to complain of a severe headache, with examination notable for brainstem findings of new nystagmus most notable in up gaze (pendular side to side), loss of uvula elevation on the right, and pseudobulbar affect.     Head CT was again repeated which showed trace subarachnoid hemorrhage on the right near the sylvian fissure.  Blood pressure goal was updated to less than 140, but given high risk of his brainstem stroke progression TPA reversal was not immediately pursued.  On repeat examination at about 11:30 AM he was noted to additionally have left-sided visual field cut as well as some left-sided neglect. Head CT was essentially stable with minimal progression of the [facility], and therefore CTA was repeated to evaluate for potential R MCA occlusion. 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 Case  discussed extensively with Dr. Tamera Punt (neuro IR) and Dr. Lavera Guise (Stroke neurology). Given difficulty of approach to R PCA, likely to require stenting of the L vert occlusion followed by traversal of a second stenotic area, as well as SAH which would make DAPT required to prevent in-stent thrombosis very risky with regards to worsening bleeding, decided to defer intervention.   BP goal was increased to 150-170 SBP with improvement of brainstem findings, then reduced to 140-160, with stable R PCA related findings (hemianopia, inability to look to the left), but also improved left sided weakness. Headache was slightly worse but generally stable (at worst patient reported 6/10, which improved to 4/10 and most  recently 4.5/10; wife notes he has a very high pain tolerance).  Stability head CT revealed temporal lobe intraparenchymal hemorrhage, after which decision was made to reverse with TXA.  At that time the patient's examination remained stable, NIH of 6 for mild drowsiness, right gaze preference, left hemianopia, left facial droop that is mild, limb ataxia in the left upper extremity, mild dysarthria (one-point for each finding except 2 points for the hemianopia)   On initial history he reported some chronic intermittent left hand tingling managed by chiropractic manipulation, chronic neck pain managed by chiropractic manipulation (high-speed manipulation, last performed 7/28), an episode of chest pain and left hand weakness the week prior to presentation which fully resolved, in the setting of increasing in his exercise from 2 miles daily walking to 3 miles daily walking.  He reports prior heavier alcohol use and reports having cut back to no more than 7 drinks per week, and no more than 2 drinks per day.  Denies any other substance use.  Reports chronic shortness of breath that waxes and wanes since his COVID diagnosis in May 2021.  Complete NIHSS TOTAL: 4  Patient's medical record from [facility] has been reviewed by the rehabilitation admission coordinator and physician.  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  Prior Rehab/Hospitalizations Has the patient had prior rehab or hospitalizations prior to admission? Yes  Has the patient had major surgery during 100 days prior to admission? No   Current Medications  Current Facility-Administered Medications:    0.9 %  sodium chloride infusion, , Intravenous, Once, Christian,  Rylee, MD   acetaminophen (TYLENOL) tablet 650 mg, 650 mg, Oral, Q4H PRN, 650 mg at 04/04/21 0122 **OR** acetaminophen (TYLENOL) 160 MG/5ML solution 650 mg, 650 mg, Per Tube, Q4H PRN **OR** acetaminophen (TYLENOL) suppository 650 mg, 650 mg, Rectal, Q4H PRN, Kerney Elbe, MD   aspirin EC tablet 81 mg, 81 mg, Oral, Daily, Sethi, Pramod S, MD, 81 mg at 04/04/21 0901   atorvastatin (LIPITOR) tablet 80 mg, 80 mg, Oral, Daily, Metzger-Cihelka, Desiree, NP, 80 mg at 04/03/21 1819   Chlorhexidine Gluconate Cloth 2 % PADS 6 each, 6 each, Topical, Q0600, Kerney Elbe, MD, 6 each at 04/03/21 0657   labetalol (NORMODYNE) injection 20 mg, 20 mg, Intravenous, Q2H PRN, Metzger-Cihelka, Desiree, NP, 20 mg at 04/04/21 0122   lisinopril ([name]) tablet 10 mg, 10 mg, Oral, Daily, Kerney Elbe, MD, 10 mg at 04/04/21 0901   melatonin tablet 5 mg, 5 mg, Oral, QHS PRN, Lindzen,  Randall Hiss, MD, 5 mg at 04/03/21 2210   metoprolol tartrate (LOPRESSOR) tablet 12.5 mg, 12.5 mg, Oral, BID, Greta Doom, MD, 12.5 mg at 04/04/21 0900   pantoprazole (PROTONIX) EC tablet 40 mg, 40 mg, Oral, Daily, Pham, Minh Q, RPH-CPP, 40 mg at 04/04/21 0901   senna-docusate (Senokot-S) tablet 1 tablet, 1 tablet, Oral, QHS PRN, Kerney Elbe, MD  Patients Current Diet:  Diet Order             DIET DYS 3 Room service appropriate? Yes with Assist; Fluid consistency: Nectar Thick  Diet effective now                  Precautions / Restrictions Precautions Precautions: Fall, Other (comment) Precaution Comments: L hemianopia, BP < 160 Restrictions Weight Bearing Restrictions: No   Has the patient had 2 or more falls or a fall with injury in the past year? No  Prior Activity Level Community (5-7x/wk): Pt. active in the community PTA  Prior Functional Level Self Care: Did the patient need help bathing, dressing, using the toilet or eating? Independent  Indoor Mobility: Did the patient need assistance with walking from room  to room (with or without device)? Independent  Stairs: Did the patient need assistance with internal or external stairs (with or without device)? Independent  Functional Cognition: Did the patient need help planning regular tasks such as shopping or remembering to take medications? Independent  Home Assistive Devices / Equipment Home Equipment: Grab bars - tub/shower, Cane - single point, Environmental consultant - 2 wheels  Prior Device Use: Indicate devices/aids used by the patient prior to current illness, exacerbation or injury? None of the above  Current Functional Level Cognition  Arousal/Alertness: Awake/alert Overall Cognitive Status: Impaired/Different from baseline Current Attention Level: Selective Orientation Level: Oriented X4 Following Commands: Follows one step commands with increased time, Follows one step commands consistently, Follows multi-step commands inconsistently Safety/Judgement: Decreased awareness of deficits, Decreased awareness of safety General Comments: Continues with L sided visual deficits and inattention to tasks. Has been using LUE functionally but fine motor is limiting his ability to manage hearing aid and donning mask Attention: Sustained Sustained Attention: Appears intact Memory: Impaired Memory Impairment: Storage deficit, Retrieval deficit, Decreased recall of new information Awareness: Impaired Awareness Impairment: Anticipatory impairment Problem Solving: Impaired Problem Solving Impairment: Verbal complex, Functional complex Behaviors: Impulsive Safety/Judgment: Impaired    Extremity Assessment (includes Sensation/Coordination)  Upper Extremity Assessment: RUE deficits/detail, LUE deficits/detail RUE Deficits / Details: strength 4/5 RUE Sensation: WNL LUE Deficits / Details: strength 4/5; poor coordination; poor figure ground and apraxia; sensation intact LUE Sensation: decreased proprioception LUE Coordination: decreased fine motor, decreased gross  motor  Lower Extremity Assessment: Overall WFL for tasks assessed    ADLs  Overall ADL's : Needs assistance/impaired Eating/Feeding: Minimal assistance, Sitting Eating/Feeding Details (indicate cue type and reason): cues for food on L side of plate Grooming: Minimal assistance, Sitting Upper Body Bathing: Moderate assistance, Sitting Upper Body Bathing Details (indicate cue type and reason): IV in arm, wrapped for protection Lower Body Bathing: Moderate assistance, Sitting/lateral leans, Sit to/from stand, Cueing for safety, Cueing for sequencing Upper Body Dressing : Minimal assistance, Sitting Lower Body Dressing: Moderate assistance, Sitting/lateral leans, Sit to/from stand Lower Body Dressing Details (indicate cue type and reason): increased time; decreased ability in LUE to manipulate sock in hand Toilet Transfer: Minimal assistance, +2 for physical assistance, +2 for safety/equipment, Stand-pivot, BSC Toilet Transfer Details (indicate cue type and reason): simulated to recliner Toileting- Clothing Manipulation  and Hygiene: Moderate assistance, +2 for physical assistance, +2 for safety/equipment, Sitting/lateral lean, Sit to/from stand, Cueing for sequencing, Cueing for safety Functional mobility during ADLs: Minimal assistance, +2 for physical assistance, +2 for safety/equipment, Cueing for safety, Cueing for sequencing General ADL Comments: Pt limited by L visual deficits appears to be in bilateral eyes not able to scan past midline to L side (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 Pt requires assist for ADL tasks as L incoordation and poor awareness of deficits on L side.    Mobility  Overal bed mobility: Needs Assistance Bed Mobility: Supine to Sit Rolling: Min guard Supine to sit: Supervision General bed mobility comments: Pt spontaneously initiated transition to EOB. Was able to complete without assistance however decreased safety awareness noted.    Transfers  Overall transfer level:  Needs assistance Equipment used: None Transfers: Sit to/from Stand Sit to Stand: Min guard, Min assist Stand pivot transfers: Min assist, +2 physical assistance, +2 safety/equipment General transfer comment: Close guard for safety but overall pt was able to power-up to full stand without assistance. Pt demonstrating decreased safety awareness, dropping things on the floor and attempting to bend down to pick them up in the middle of transferring to/from stand. Min assist provided during these times with cues for optimal safety.    Ambulation / Gait / Stairs / Wheelchair Mobility  Ambulation/Gait Ambulation/Gait assistance: Herbalist (Feet): 200 Feet Assistive device: None Gait Pattern/deviations: Step-through pattern, Decreased stride length, Drifts right/left General Gait Details: Decreased L reciprocal arm swing, drifting towards and staying close to right side of wall, mod-max cues for environmental and obstacle negotiation, turns, locating objects. Occasional LOB requiring min assist to recover. Gait velocity: decreased Gait velocity interpretation: 1.31 - 2.62 ft/sec, indicative of limited community ambulator Stairs: Yes Stairs assistance: Min assist Stair Management: One rail Right, With cane, Step to pattern, Forwards Number of Stairs: 5 General stair comments: VC's for sequencing and generally safety required. Pt with 1 LOB when he kicked the [facility] when advancing.    Posture / Balance Balance Overall balance assessment: Needs assistance Sitting-balance support: Feet supported Sitting balance-Leahy Scale: Good Standing balance support: No upper extremity supported, During functional activity Standing balance-Leahy Scale: Fair Standing balance comment: handheld support   Special needs/care consideration    Previous Home Environment  Living Arrangements: Spouse/significant other  Lives With: Spouse Available Help at Discharge: Family, Available  PRN/intermittently Type of Home: House Home Layout: One level Home Access: Stairs to enter Entrance Stairs-Rails: Right Entrance Stairs-Number of Steps: 5 Bathroom Shower/Tub: Chiropodist: Standard Additional Comments: Pt's spouse provided PLOF info as pt was very distracted.  Discharge Living Setting Plans for Discharge Living Setting: Patient's home Type of Home at Discharge: House Discharge Home Layout: One level Discharge Home Access: Stairs to enter Entrance Stairs-Rails: Right Entrance Stairs-Number of Steps: 5 Discharge Bathroom Shower/Tub: Tub/shower unit Discharge Bathroom Toilet: Standard Discharge Bathroom Accessibility: Yes  Social/Family/Support Systems Patient Roles: Spouse Contact Information: [phone] Anticipated Caregiver: Beecher Mcardle Anticipated Caregiver's Contact Information: [phone] Ability/Limitations of Caregiver: Can provide up to min A Caregiver Availability: 24/7 Discharge Plan Discussed with Primary Caregiver: Yes Is Caregiver In Agreement with Plan?: Yes Does Caregiver/Family have Issues with Lodging/Transportation while Pt is in Rehab?: Yes  Goals Patient/Family Goal for Rehab: PT/OT/SLP supervision Expected length of stay: 7-10 days Pt/Family Agrees to Admission and willing to participate: Yes Program Orientation Provided & Reviewed with Pt/Caregiver Including Roles  & Responsibilities: Yes  Decrease burden of Care through  IP rehab admission: Specialzed equipment needs, Decrease number of caregivers, Bowel and bladder program, and Patient/family education  Possible need for SNF placement upon discharge: not anticipated   Patient Condition: I have reviewed medical records from [facility], spoken with CM, and patient and spouse. I met with patient at the bedside and discussed via phone for inpatient rehabilitation assessment.  Patient will benefit from ongoing PT, OT, and SLP, can actively participate  in 3 hours of therapy a day 5 days of the week, and can make measurable gains during the admission.  Patient will also benefit from the coordinated team approach during an Inpatient Acute Rehabilitation admission.  The patient will receive intensive therapy as well as Rehabilitation physician, nursing, social worker, and care management interventions.  Due to safety, skin/wound care, disease management, medication administration, pain management, and patient education the patient requires 24 hour a day rehabilitation nursing.  The patient is currently min assist with mobility and basic ADLs.  Discharge setting and therapy post discharge at home with home health is anticipated.  Patient has agreed to participate in the Acute Inpatient Rehabilitation Program and will admit today.  Preadmission Screen Completed By: Clemens Catholic with updates by Cleatrice Burke, 04/04/2021 1:15 PM ______________________________________________________________________   Discussed status with Dr. Ranell Patrick on 04/04/2021 at 1313 and received approval for admission today.  Admission Coordinator: Clemens Catholic with updates by Cleatrice Burke, RN, time [phone] Date 04/04/2021   Assessment/Plan: Diagnosis: R PCA CVA Does the need for close, 24 hr/day Medical supervision in concert with the patient's rehab needs make it unreasonable for this patient to be served in a less intensive setting? Yes Co-Morbidities requiring supervision/potential complications: generalized weakness, h/o bilateral pulmonary embolism, h/ of hypoxemic respiratory failure 2/2 COVID-19, hyperglycemia, hypocalcemia, anemia Due to bladder management, bowel management, safety, skin/wound care, disease management, medication administration, pain management, and patient education, does the patient require 24 hr/day rehab nursing? Yes Does the patient require coordinated care of a physician, rehab nurse, PT, OT, and SLP to address physical and functional  deficits in the context of the above medical diagnosis(es)? Yes Addressing deficits in the following areas: balance, endurance, locomotion, strength, transferring, bowel/bladder control, bathing, dressing, feeding, grooming, toileting, cognition, and psychosocial support Can the patient actively participate in an intensive therapy program of at least 3 hrs of therapy 5 days a week? Yes The potential for patient to make measurable gains while on inpatient rehab is excellent Anticipated functional outcomes upon discharge from inpatient rehab: supervision PT, supervision OT, supervision SLP Estimated rehab length of stay to reach the above functional goals is: 5-7 days Anticipated discharge destination: Home 10. Overall Rehab/Functional Prognosis: excellent   MD Signature: Leeroy Cha, MD

## 2021-04-01 NOTE — Progress Notes (Signed)
Inpatient Rehab Admissions Coordinator:   I spoke with Pt. And wife over the phone and they state interest and indicate that family can provide 24/7 support at d/c.I will pursue for potential admit once medically stable, pending insurance auth and bed availability.  Clemens Catholic, [place], [facility] Admissions Coordinator  [phone] ([place]) [phone] (office)

## 2021-04-01 NOTE — Progress Notes (Addendum)
Patient ID: Geoffrey Wells, male   DOB: Feb 10, 1946, 75 y.o.   MRN: [id number] STROKE TEAM PROGRESS NOTE   INTERVAL HISTORY PT recommended CIR. Speech seen and advanced diet.  Echo completed. Doing better today. More alert, interactive. Nursing agrees. No family at bedside today. Spoke to his wife today via telephone to update her on his progress.all her questions were answered.   C/o of headache 4/10 but this appears to be baseline for him and has headaches at home. Right back of the head. Tylenol is helping and not severe at this point.    Vitals:   04/01/21 0700 04/01/21 0800 04/01/21 0830 04/01/21 0910  BP: 138/78 (!) 152/82 130/82 130/86  Pulse: 66 80 71 72  Resp: (!) 25 (!) '24 20 17  '$ Temp:  97.8 F (36.6 C)    TempSrc:  Oral    SpO2: 93% 96% 95% 97%   CBC:  Recent Labs  Lab 03/30/21 0858 03/30/21 1616 03/30/21 2029  WBC 5.3 7.2 7.0  NEUTROABS 3.2  --   --   HGB 13.5 13.3 13.6  HCT 39.8 39.3 41.1  MCV 84.7 85.1 84.4  PLT 182 182 [id number]   Basic Metabolic Panel:  Recent Labs  Lab 03/30/21 0858  NA 138  K 4.4  CL 108  CO2 25  GLUCOSE 112*  BUN 20  CREATININE 1.03  CALCIUM 9.1   Lipid Panel:  Recent Labs  Lab 03/30/21 2029  CHOL 269*  TRIG 89  HDL 67  CHOLHDL 4.0  VLDL 18  LDLCALC 184*   HgbA1c:  Recent Labs  Lab 03/30/21 2029  HGBA1C 5.8*   Urine Drug Screen: No results for input(s): LABOPIA, COCAINSCRNUR, LABBENZ, AMPHETMU, THCU, LABBARB in the last 168 hours.  Alcohol Level No results for input(s): ETH in the last 168 hours.  IMAGING past 24 hours No results found.  PHYSICAL EXAM General: Appears well-developed. No acute distress Psych: Affect appropriate to situation Eyes: No scleral injection HENT: No OP obstrucion Head: Normocephalic.  Cardiovascular: Normal rate and regular rhythm.  Respiratory: Effort normal and breath sounds normal to anterior ascultation GI: Soft.  No distension. There is no tenderness.  Skin: WDI    Neurological  Examination Mental Status: Alert, awake. Interactive. Oriented to person, place (hospital) and year, month.Speech fluent without evidence of aphasia, but is moderately dysarthric. Able to follow 3 step commands without difficulty. Cranial Nerves: II: Left homonomous hemianopia  III,IV, VI: ptosis not present, extra-ocular motions intact bilaterally, pupils equal, round, reactive to light and accommodation V,VII: mild left facial droop, facial light touch sensation normal bilaterally VIII: hearing normal bilaterally IX,X: uvula rises symmetrically XI: bilateral shoulder shrug XII: midline tongue extension Motor: Right : Upper extremity   5/5    Left:     Upper extremity   5/5  Lower extremity   5/5     Lower extremity   5/5 Tone and bulk:normal tone throughout; no atrophy noted Sensory: Pinprick and light touch intact throughout, bilaterally Deep Tendon Reflexes: 2+ and symmetric throughout Plantars: Right: downgoing   Left: downgoing Cerebellar: normal finger-to-nose, normal rapid alternating movements and normal heel-to-shin test Gait: normal gait and station   ASSESSMENT/PLAN Mr. Geoffrey Wells is a 75 y.o. male with history of hypertension who presented to [facility] ED with left sided weakness and was found to have a right PCA occlusion complicated by subsequent development of subarachnoid (Hunt & Hess Grade 1) and intraparenchymal hemorrhaging after receiving tPA. Now status post TXA and 10U cryoprecipitate  administration. He was subsequently transferred to [facility] for ongoing evaluation and monitoring.  Stroke: RMCA Code Stroke CT head No acute abnormality. Small vessel disease. Atrophy. ASPECTS 10.  Post tPA: small volume ASH Acute parenchymal hemorrhage within posterior right temporal lobe, still small vol SAH. Evolving Right occipital infarct. CTA head & neck stenosis of left A2 ACA, congenitally small caliber right vertebral artery. F/u CTA: occlusion of P3 MRI  Multifocal ischemic  infarcts involving the right cerebral. Additional 7 mm acute ischemic infarct involving the mesial left occipital lobe hemisphere, right MCA and PCA distributions. 2D Echo completed. No thrombus. EF 65%. Consider TEE when stable. LDL 184 HgbA1c 5.8 VTE prophylaxis - SCDs Diet: advated to D3 soft mechanical diet today aspirin 81 mg daily prior to admission, now on  none d/t bleed .  Therapy recommendations:  pending Disposition:  possible CIR  Hypertension Home meds:  [name] '20mg'$  Stable Blood pressure goal: SBP 120-171mHg. Stable overnight. Cleviprex drip stopped. Prn labetalol for now. Long-term BP goal normotensive  Hyperlipidemia Home meds:  none,  LDL 184, goal < 70 Add Lipitor '80mg'$  qhs  High intensity statin  Continue statin at discharge  Diabetes type II no dx HgbA1c 5.8, goal < 7.0 CBGs Recent Labs    03/30/21 0902  GLUCAP 115*    SSI  Other Stroke Risk Factors Advanced Age >/= 6109 Former Cigarette smoker  This patient is critically ill due to stroke treated with tPA with post treatment ICH, SAH and reversal agent. He is at significant risk of neurological worsening, death form brain herniation, respiratory failure, heart failure. This patient's care requires constant monitoring of vital signs, hemodynamics, respiratory and cardiac monitoring, review of multiple databases, neurological assessment, discussion with family, other specialists and medical decision making of high complexity. I spent 30 minutes of neurocritical care time in the care of this patient.  Hospital day # 2  GEgbert Garibaldi MD. To contact Stroke Continuity provider, please refer to [email or web address] After hours, contact General Neurology

## 2021-04-01 NOTE — Progress Notes (Signed)
Modified Barium Swallow Progress Note  Patient Details  Name: Geoffrey Wells MRN: [id number] Date of Birth: 12-26-1945  Today's Date: 04/01/2021  Modified Barium Swallow completed.  Full report located under Chart Review in the Imaging Section.  Brief recommendations include the following:  Clinical Impression  Pt has a mild oropharyngeal dysphagia that may be a combination of acute neurological changes paired with mild, chronic dysphagia s/p XRT. He has reduced labial seal that results in mild L-sided anterior loss with thin liquids, with pt showing some awareness by asking intermittently for his washcloth to wipe his mouth. He has some reduced lingual coordination with thin liquids as well, appearing more functional with thicker liquids and purees. With small, single sips this is more controlled, but when he starts to get a little more volume or if he takes consecutive boluses, his pharyngeal timing is also impacted and he has aspiration before the swallow. This was sensed by either a throat clear or a cough, neither of which were effective at clearing the airway. A chin tuck resulted in mildly increased pharyngeal residuals (pt also has mild oral residue with thin liquids that spills posteriorly after the swallow), which were aspirated after the swallow, and this aspiration was silent. A head turn to the L seemed to be the most protective of his airway during this study. Pt also had improved airway protection and more efficiency with swallowing given nectar thick liquids and purees. Mild-moderate vallecular residue was present with regular solids. Recommend starting Dys 3 diet and nectar thick liquids. Pt could still have a few pieces of ice after oral care is completed. SLP will f/u for potential to utilize head turn left as a compensatory strategy to facilitate return to thin liquids.   Swallow Evaluation Recommendations       SLP Diet Recommendations: Dysphagia 3 (Mech soft) solids;Nectar thick  liquid   Liquid Administration via: Cup;Straw   Medication Administration: Whole meds with puree   Supervision: Staff to assist with self feeding;Full supervision/cueing for compensatory strategies   Compensations: Minimize environmental distractions;Slow rate;Small sips/bites   Postural Changes: Seated upright at 90 degrees;Remain semi-upright after after feeds/meals (Comment)   Oral Care Recommendations: Oral care BID   Other Recommendations: Order thickener from pharmacy;Prohibited food (jello, ice cream, thin soups);Remove water pitcher    Osie Bond., M.A. [place] Pager [phone] Office [phone]  04/01/2021,12:56 PM

## 2021-04-02 DIAGNOSIS — I63431 Cerebral infarction due to embolism of right posterior cerebral artery: Secondary | ICD-10-CM | POA: Diagnosis not present

## 2021-04-02 NOTE — Progress Notes (Signed)
Inpatient Rehab Admissions Coordinator:   Pt. Does not appear medically ready for CIR at this time as he continues on cleviprex drip and IV labetalol. He is very motivated and will be a great candidate once medically ready. I will continue to follow for potential admit pending insurance auth, medical readiness, and bed availability.  Clemens Catholic, [place], [place] Admissions Coordinator  [phone] ([place]) [phone] (office)

## 2021-04-02 NOTE — Progress Notes (Signed)
Patient ID: Geoffrey Wells, male   DOB: 08/09/1946, 75 y.o.   MRN: [id number] STROKE TEAM PROGRESS NOTE   INTERVAL HISTORY Patient is sitting in a bedside chair.  He continues to have left-sided peripheral vision loss and dysarthria or dysphagia.  Speech therapy has seen him and cleared him for dysphagia 3 diet.  His wife arrived after rounds and all questions were answered.  Blood pressure adequately controlled.  Vital signs stable.  Patient had history of COVID infection a year ago and pulmonary embolism and was on Eliquis but for unclear reason it was discontinued.  Patient's wife blames the [place] where he followed up.   Vitals:   04/02/21 0600 04/02/21 0700 04/02/21 0800 04/02/21 1000  BP: 125/65 (!) 146/61 (!) 156/72 (!) 103/50  Pulse: 61 64 68 63  Resp: '20 19 17 20  '$ Temp:   98.2 F (36.8 C)   TempSrc:   Oral   SpO2: 92% 92% 92% 92%   CBC:  Recent Labs  Lab 03/30/21 0858 03/30/21 1616 03/30/21 2029 04/01/21 1538  WBC 5.3   < > 7.0 6.0  NEUTROABS 3.2  --   --   --   HGB 13.5   < > 13.6 12.4*  HCT 39.8   < > 41.1 37.2*  MCV 84.7   < > 84.4 84.7  PLT 182   < > 196 195   < > = values in this interval not displayed.   Basic Metabolic Panel:  Recent Labs  Lab 03/30/21 0858 04/01/21 1538  NA 138 138  K 4.4 3.7  CL 108 106  CO2 25 26  GLUCOSE 112* 108*  BUN 20 15  CREATININE 1.03 1.00  CALCIUM 9.1 8.7*   Lipid Panel:  Recent Labs  Lab 03/30/21 2029  CHOL 269*  TRIG 89  HDL 67  CHOLHDL 4.0  VLDL 18  LDLCALC 184*   HgbA1c:  Recent Labs  Lab 03/30/21 2029  HGBA1C 5.8*   Urine Drug Screen: No results for input(s): LABOPIA, COCAINSCRNUR, LABBENZ, AMPHETMU, THCU, LABBARB in the last 168 hours.  Alcohol Level No results for input(s): ETH in the last 168 hours.  IMAGING past 24 hours No results found.  PHYSICAL EXAM General: Appears well-developed. No acute distress Psych: Affect appropriate to situation Eyes: No scleral injection HENT: No OP obstrucion Head:  Normocephalic.  Cardiovascular: Normal rate and regular rhythm.  Respiratory: Effort normal and breath sounds normal to anterior ascultation GI: Soft.  No distension. There is no tenderness.  Skin: WDI    Neurological Examination Mental Status: Alert, awake. Interactive. Oriented to person, place (hospital) and year, month.Speech fluent without evidence of aphasia, but is moderately dysarthric. Able to follow 3 step commands without difficulty. Cranial Nerves: II: Left dense homonomous hemianopia  III,IV, VI: ptosis not present, extra-ocular motions intact bilaterally, pupils equal, round, reactive to light and accommodation V,VII: mild left facial droop, facial light touch sensation normal bilaterally VIII: hearing normal bilaterally IX,X: uvula rises symmetrically XI: bilateral shoulder shrug XII: midline tongue extension Motor: Right : Upper extremity   5/5    Left:     Upper extremity   5/5  Lower extremity   5/5     Lower extremity   5/5 Tone and bulk:normal tone throughout; no atrophy noted Sensory: Pinprick and light touch intact throughout, bilaterally Deep Tendon Reflexes: 2+ and symmetric throughout Plantars: Right: downgoing   Left: downgoing Cerebellar: normal finger-to-nose, normal rapid alternating movements and normal heel-to-shin test Gait: normal gait  and station   ASSESSMENT/PLAN Geoffrey Wells is a 75 y.o. male with history of hypertension who presented to [facility] ED with left sided weakness and was found to have a right PCA occlusion complicated by subsequent development of subarachnoid (Hunt & Hess Grade 1) and intraparenchymal hemorrhaging after receiving tPA. Now status post TXA and 10U cryoprecipitate administration. He was subsequently transferred to [facility] for ongoing evaluation and monitoring.  Stroke: RMCA Code Stroke CT head No acute abnormality. Small vessel disease. Atrophy. ASPECTS 10.  Post tPA: small volume ASH Acute parenchymal hemorrhage within  posterior right temporal lobe, still small vol SAH. Evolving Right occipital infarct. CTA head & neck stenosis of left A2 ACA, congenitally small caliber right vertebral artery. F/u CTA: occlusion of P3 MRI  Multifocal ischemic infarcts involving the right cerebral. Additional 7 mm acute ischemic infarct involving the mesial left occipital lobe hemisphere, right MCA and PCA distributions. 2D Echo completed. No thrombus. EF 65%. Consider TEE when stable. LDL 184 HgbA1c 5.8 VTE prophylaxis - SCDs Diet: advated to D3 soft mechanical diet today aspirin 81 mg daily prior to admission, now on  none d/t bleed .  Therapy recommendations: CIR recommended Disposition:  possible CIR  Hypertension Home meds:  [name] '20mg'$  Stable Blood pressure goal: SBP 120-131mHg. Stable overnight. Cleviprex drip stopped. Prn labetalol for now. Long-term BP goal normotensive  Hyperlipidemia Home meds:  none,  LDL 184, goal < 70 Add Lipitor '80mg'$  qhs  High intensity statin  Continue statin at discharge  Other Stroke Risk Factors Advanced Age >/= 683 Former Cigarette smoker  Plan continue mobilize out of bed.  Physical Occupational Therapy and rehab consults.  Continue dysphagia diet.  Check lower extremity venous Doppler for DVT and if positive patient may need anticoagulation after about a month from his post tPA hemorrhage.  Transfer to the neurology floor bed later today.  Patient counseled not to drive till peripheral vision improves.  Long discussion patient and wife and answered questions. This patient is critically ill due to stroke treated with tPA with post treatment ICH, SAH and reversal agent. He is at significant risk of neurological worsening, death form brain herniation, respiratory failure, heart failure. This patient's care requires constant monitoring of vital signs, hemodynamics, respiratory and cardiac monitoring, review of multiple databases, neurological assessment, discussion with family,  other specialists and medical decision making of high complexity. I spent 30 minutes of neurocritical care time in the care of this patient. Geoffrey Wells MAtl[facility] Hospitalday # 3   To contact Stroke Continuity provider, please refer to [email or web address] After hours, contact General Neurology

## 2021-04-02 NOTE — Evaluation (Signed)
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  He is  s/p tPA with worsening of symptoms  post admistration.  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 Received reversal medication. PMHx: COVID PNA with ongoing issues with SHOB.  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  Patient reports seeing ST for services at the [facility] to address swallowing defiicts post treatment.   Assessment / Plan / Recommendation Clinical Impression  Pt presents with dysarthria impacting intelligibility at the conversational level. He also presents with cognitive impairments that are suspected to be a combination of  acute changes s/p CVA along with pt reported changes s/p COVID last year.Marland Kitchen He said that PTA he was still feeling "foggy" and experiencing memory difficulties, and after cognitive testing through [facility] he was told that he needed SLP services, which had not yet started. Pt currently is having decreased anticipatory awareness  and problem solving around acute physical and visual changes, although he can demonstrate adequate intellectual awareness. He was only able to recall 2/4 words on a delayed recall task, increasing to 3/4 given cues including multiple choices. He is impulsive at times too, getting up from his chair unassisted to adjust his positioning and not showing awareness of leads. Pt will benefit from cognitive and speech therapy acutely and at CIR level.    SLP Assessment  SLP Recommendation/Assessment: Patient needs continued Speech Lanaguage Pathology Services SLP Visit Diagnosis: Cognitive communication deficit (R41.841)    Follow Up Recommendations  Inpatient Rehab    Frequency and Duration min 2x/week  2 weeks      SLP Evaluation Cognition  Overall Cognitive Status: Impaired/Different from baseline Arousal/Alertness: Awake/alert Orientation Level: Oriented X4 Attention: Sustained Sustained Attention: Appears intact Memory: Impaired Memory Impairment: Storage deficit;Retrieval deficit;Decreased recall of new information Awareness: Impaired Awareness Impairment: Anticipatory impairment Problem Solving: Impaired Problem Solving Impairment: Verbal complex;Functional complex Behaviors: Impulsive Safety/Judgment: Impaired       Comprehension  Auditory Comprehension Overall Auditory Comprehension: Appears within functional limits for tasks assessed    Expression Expression Primary Mode of Expression: Verbal Verbal Expression Overall Verbal Expression: Appears within functional limits for tasks assessed   Oral / Motor  Motor Speech Overall Motor Speech: Impaired Respiration: Within functional limits Phonation: Normal Resonance: Within functional limits Articulation: Impaired Level of Impairment: Conversation Intelligibility: Intelligibility reduced Conversation: 75-100% accurate Motor Planning: Witnin functional limits Motor Speech Errors: Not applicable   GO  Osie Bond., M.A. [place] Acute Rehabilitation Services Pager [phone] Office [phone]  04/02/2021, 3:03 PM

## 2021-04-02 NOTE — Progress Notes (Signed)
Physical Therapy Treatment Patient Details Name: Geoffrey Wells MRN: [id number] DOB: 1945-11-06 Today's Date: 04/02/2021    History of Present Illness 75 yo male s/p L hand weakness and L facial droop s/p tPA with worsening off symptoms found SAH, occlusion of P3 branch and acute parenchymal hemorrage with posterior R temporal lobe found afterwards. Received reversal medication. PMHx: COVID PNA.    PT Comments    Pt making excellent progress towards his physical therapy goals. Session focused on standing balance, gait training, and visual scanning. Pt ambulating 75 ft x 2 with no assistive device at a min assist level. He requires assist to prevent from running into obstacles and to navigate the environment. Worked on locating signage/pictures/objects on the left for which pt required min-mod cues. Continue to highly recommend CIR to address deficits and maximize functional independence.    Follow Up Recommendations  CIR;Supervision/Assistance - 24 hour     Equipment Recommendations  None recommended by PT    Recommendations for Other Services Rehab consult     Precautions / Restrictions Precautions Precautions: Fall;Other (comment) Precaution Comments: L hemianopsia, BP < 160 Restrictions Weight Bearing Restrictions: No    Mobility  Bed Mobility Overal bed mobility: Needs Assistance Bed Mobility: Supine to Sit     Supine to sit: Supervision     General bed mobility comments: auditory cues provided for exiting towards left side of bed    Transfers Overall transfer level: Needs assistance Equipment used: None Transfers: Sit to/from Stand Sit to Stand: Min guard         General transfer comment: Min guard for safety  Ambulation/Gait Ambulation/Gait assistance: Min assist Gait Distance (Feet): 75 Feet (75", 75") Assistive device: None Gait Pattern/deviations: Step-through pattern;Decreased stride length;Drifts right/left Gait velocity: decreased   General Gait  Details: Decreased L reciprocal arm swing, drifting towards and staying close to right side of wall, mod-max cues for environmental and obstacle negotiation, turns, locating objects   Stairs             Wheelchair Mobility    Modified Rankin (Stroke Patients Only) Modified Rankin (Stroke Patients Only) Pre-Morbid Rankin Score: No significant disability Modified Rankin: Moderately severe disability     Balance Overall balance assessment: Needs assistance Sitting-balance support: Feet supported Sitting balance-Leahy Scale: Good     Standing balance support: No upper extremity supported;During functional activity Standing balance-Leahy Scale: Fair                              Cognition Arousal/Alertness: Awake/alert Behavior During Therapy: Flat affect Overall Cognitive Status: Impaired/Different from baseline Area of Impairment: Attention;Memory;Following commands;Safety/judgement;Awareness;Problem solving                   Current Attention Level: Selective Memory: Decreased short-term memory Following Commands: Follows one step commands with increased time Safety/Judgement: Decreased awareness of deficits;Decreased awareness of safety Awareness: Emergent Problem Solving: Slow processing;Difficulty sequencing;Requires tactile cues General Comments: Continues with L sided visual deficits and inattention to tasks. Has been using LUE functionally, asking, "when am I going to get my strength back?"      Exercises Other Exercises Other Exercises: Standing: functional reaching with LUE and visual scanning task with locating objects/pictures on wall    General Comments        Pertinent Vitals/Pain Pain Assessment: Faces Faces Pain Scale: No hurt    Home Living  Prior Function            PT Goals (current goals can now be found in the care plan section) Acute Rehab PT Goals Patient Stated Goal: to go to rehab PT  Goal Formulation: With patient/family Time For Goal Achievement: 04/14/21 Potential to Achieve Goals: Good Progress towards PT goals: Progressing toward goals    Frequency    Min 4X/week      PT Plan Current plan remains appropriate    Co-evaluation              AM-PAC PT "6 Clicks" Mobility   Outcome Measure  Help needed turning from your back to your side while in a flat bed without using bedrails?: None Help needed moving from lying on your back to sitting on the side of a flat bed without using bedrails?: A Little Help needed moving to and from a bed to a chair (including a wheelchair)?: A Little Help needed standing up from a chair using your arms (e.g., wheelchair or bedside chair)?: A Little Help needed to walk in hospital room?: A Lot Help needed climbing 3-5 steps with a railing? : A Lot 6 Click Score: 17    End of Session Equipment Utilized During Treatment: Gait belt Activity Tolerance: Patient tolerated treatment well Patient left: in chair;with call bell/phone within reach;with chair alarm set;with family/visitor present Nurse Communication: Mobility status PT Visit Diagnosis: Unsteadiness on feet (R26.81);Difficulty in walking, not elsewhere classified (R26.2)     Time: [id number] PT Time Calculation (min) (ACUTE ONLY): 32 min  Charges:  $Gait Training: 8-22 mins $Therapeutic Activity: 8-22 mins                     Wyona Almas, PT, DPT Acute Rehabilitation Services Pager [phone] Office [phone]    Deno Etienne 04/02/2021, 1:14 PM

## 2021-04-02 NOTE — Progress Notes (Signed)
OT Cancellation Note  Patient Details Name: Geoffrey Wells MRN: [id number] DOB: 06-Sep-1945   Cancelled Treatment:    Reason Eval/Treat Not Completed: Other (comment) (Pt was speaking with preacher earlier at 1pm while eating. Checked on pt at 3pm and pt being transferred to 3W and will need to be assessed by RN prior to OT treatment. OT to continue to follow.)  [name], OTR/L Acute Rehabilitation Services Pager: [phone] Office: [phone]   Kayla Weekes C 04/02/2021, 3:15 PM

## 2021-04-02 NOTE — Progress Notes (Signed)
Pt has been admitted to the unit via wheelchair. All belongings has been sent and telephone and call light are within reach.  04/02/21 1516  Vitals  Temp 98.1 F (36.7 C)  Temp Source Oral  BP 139/72  MAP (mmHg) 92  BP Location Left Arm  BP Method Automatic  Patient Position (if appropriate) Lying  Pulse Rate 64  Pulse Rate Source Dinamap  Resp 18  Level of Consciousness  Level of Consciousness Alert  MEWS COLOR  MEWS Score Color Green  Oxygen Therapy  SpO2 98 %  O2 Device Room Air  Pain Assessment  Pain Scale 0-10  Pain Score 0  ECG Monitoring  Telemetry Box Number [phone]  Tele Box Verification Completed by Second Verifier Completed  Glasgow Coma Scale  Eye Opening 4  Best Verbal Response (NON-intubated) 5  Best Motor Response 6  Glasgow Coma Scale Score 15  MEWS Score  MEWS Temp 0  MEWS Systolic 0  MEWS Pulse 0  MEWS RR 0  MEWS LOC 0  MEWS Score 0

## 2021-04-02 NOTE — Progress Notes (Signed)
  Speech Language Pathology Treatment: Dysphagia  Patient Details Name: Stephanos Tiet MRN: [id number] DOB: 1945/12/13 Today's Date: 04/02/2021 Time: [id number] SLP Time Calculation (min) (ACUTE ONLY): 15 min  Assessment / Plan / Recommendation Clinical Impression  Pt was seen after MBS on previous date. He recalled the need for thicker liquids, but needed reinforcement for rationale (in part due to medical understanding). He does have occasional throat clearing, but none that seemed to immediately follow the swallow as was observed with thin liquids during MBS. No coughing is observed and he subjectively feels like he is swallowing well on current diet. Would continue with current diet for now with SLP f/u to facilitate advancement.    HPI HPI: 75 yo male s/p admitted to [facility] with L hand weakness and L facial droop.  He is  s/p tPA with worsening of symptoms  post admistration.  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 Received reversal medication. PMHx: COVID PNA with ongoing issues with SHOB.  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  Patient reports seeing ST for services at the [facility] to address swallowing defiicts post treatment.      SLP Plan  Continue with current plan of care       Recommendations  Diet recommendations: Dysphagia 3 (mechanical soft);Nectar-thick liquid Liquids provided via: Cup;Straw Medication Administration: Whole meds with puree Supervision: Staff to assist with self feeding;Full supervision/cueing for compensatory strategies Compensations: Minimize environmental distractions;Slow rate;Small sips/bites Postural Changes and/or Swallow Maneuvers: Seated upright 90 degrees                Oral Care Recommendations: Oral care BID Follow up Recommendations: Inpatient Rehab SLP Visit Diagnosis: Dysphagia,  oropharyngeal phase (R13.12) Plan: Continue with current plan of care       GO                Osie Bond., M.A. [place] Acute Rehabilitation Services Pager [phone] Office [phone]  04/02/2021, 2:04 PM

## 2021-04-03 ENCOUNTER — Encounter (HOSPITAL_COMMUNITY): Payer: Self-pay | Admitting: Neurology

## 2021-04-03 ENCOUNTER — Inpatient Hospital Stay (HOSPITAL_COMMUNITY): Payer: No Typology Code available for payment source

## 2021-04-03 DIAGNOSIS — I8000 Phlebitis and thrombophlebitis of superficial vessels of unspecified lower extremity: Secondary | ICD-10-CM | POA: Diagnosis not present

## 2021-04-03 DIAGNOSIS — I10 Essential (primary) hypertension: Secondary | ICD-10-CM

## 2021-04-03 DIAGNOSIS — I480 Paroxysmal atrial fibrillation: Secondary | ICD-10-CM

## 2021-04-03 DIAGNOSIS — E782 Mixed hyperlipidemia: Secondary | ICD-10-CM

## 2021-04-03 DIAGNOSIS — I63431 Cerebral infarction due to embolism of right posterior cerebral artery: Secondary | ICD-10-CM

## 2021-04-03 DIAGNOSIS — I712 Thoracic aortic aneurysm, without rupture: Secondary | ICD-10-CM

## 2021-04-03 DIAGNOSIS — I351 Nonrheumatic aortic (valve) insufficiency: Secondary | ICD-10-CM

## 2021-04-03 MED ORDER — METOPROLOL TARTRATE 12.5 MG HALF TABLET
12.5000 mg | ORAL_TABLET | Freq: Two times a day (BID) | ORAL | Status: DC
  Administered 2021-04-03 – 2021-04-04 (×4): 12.5 mg via ORAL
  Filled 2021-04-03 (×4): qty 1

## 2021-04-03 MED ORDER — PANTOPRAZOLE SODIUM 40 MG PO TBEC
40.0000 mg | DELAYED_RELEASE_TABLET | Freq: Every day | ORAL | Status: DC
  Administered 2021-04-03 – 2021-04-04 (×2): 40 mg via ORAL
  Filled 2021-04-03 (×2): qty 1

## 2021-04-03 MED ORDER — ASPIRIN EC 81 MG PO TBEC
81.0000 mg | DELAYED_RELEASE_TABLET | Freq: Every day | ORAL | Status: DC
  Administered 2021-04-03 – 2021-04-04 (×2): 81 mg via ORAL
  Filled 2021-04-03 (×2): qty 1

## 2021-04-03 NOTE — Progress Notes (Signed)
Lower extremity venous has been completed.   Preliminary results in CV Proc.   Abram Sander 04/03/2021 11:50 AM

## 2021-04-03 NOTE — Consult Note (Addendum)
Cardiology Consultation:   Patient ID: Squire Loser MRN: [id number]; DOB: Feb 01, 1946  Admit date: 03/30/2021 Date of Consult: 04/03/2021  PCP:  [facility] [place] [facility] Cardiologist:  New to [facility]; Dr. Gwenlyn Found     Patient Profile:   Deagan Chicas is a 75 y.o. male with a PMH of HTN, HLD, carotid artery stenosis, ascending aortic aneurysm, history of bilateral PE (2021), history of tobacco abuse, who presented with an acute stroke c/b hemorrhagic conversion following tPA administration, who is being seen 04/03/2021 for the evaluation of new onset atrial fibrillation at the request of Dr. Leonie Man.  History of Present Illness:   Mr. Macak was in his usual state of health until 03/30/21 when he presented to [facility] with complaints of left hand weakness. CODE Stroke activate. CT head was without acute findings. Decision made to administer tPA given c/f stuttering lacunar stroke. Unfortunately patient subsequently developed severe HA, nystagmus/upgaze, and pseudobulbar affect. Repeat CT head showed trace subarachnoid hemorrhage. There was a discussion about possible intervention to R PCA occlusion between neurology and neuroIR, however with SAH, DAPT was felt to be risky with regards to bleeding and intervention deferred.  He was transferred to [facility] for ongoing evaluation and monitoring. Echocardiogram this admission showed EF 60-65%, G1DD, mild LVH, no RWMA, moderate AI, and moderate dilation of the ascending aorta (43m). Suspicions were high for cardioembolic source. Overnight patient developed new onset atrial fibrillation with rates in the 130s at times. He was started on metoprolol for rate control, however with recent hemorrhagic stroke, anticoagulation deferred and he was started on aspirin '81mg'$ . Cardiology asked to evaluate.  Patient has no significant cardiac history and does not follow with a cardiologist. He has not had any prior ischemic testing, though is noted to  have moderate aortic atherosclerosis and moderate coronary artery calcifications on prior CT scan from 2021.   At the time of this evaluation he reports feeling fatigued. He was unaware of any palpitations or racing heart beat sensations overnight. He was overall asymptomatic. In the past several months, he reports one isolated episode of chest pressure while hiking. He had associated SOB and swimmy headedness, otherwise no associated diaphoresis, dizziness, lightheadedness, palpitations, syncope, nausea, or vomiting. The episode lasted ~ 1 hour and resolved spontaneously. This is the first time he has ever experienced chest pain. He denies trouble with orthopnea, PND, LE edema, significant snoring, or syncope. He reports ~20 year history of smoking, quit in his 467s His father passed from a stoke in his 530s Mother passed from an aneurysm.    Past Medical History:  Diagnosis Date   Pneumonia due to COVID-19 virus 01/10/2020    Past Surgical History:  Procedure Laterality Date   HERNIA REPAIR       Home Medications:  Prior to Admission medications   Medication Sig Start Date End Date Taking? Authorizing Provider  5-Hydroxytryptophan (5-HTP) 200 MG TABS Take 1 tablet by mouth daily.   Yes [provider]  Ascorbic Acid (VITAMIN C) 1000 MG tablet Take 1,000 mg by mouth daily.   Yes [provider]  b complex vitamins capsule Take 1 capsule by mouth daily.   Yes [provider]  Coenzyme Q10 (CO Q10) 100 MG CAPS Take 1 capsule by mouth daily.   Yes [provider]  LITHIUM PO Take 2 mg by mouth daily.   Yes [provider]  Magnesium 500 MG TABS Take 1 tablet by mouth daily.  Yes [provider]  MELATONIN PO Take 6 mg by mouth at bedtime.   Yes [provider]  Nutritional Supplements (JUICE PLUS FIBRE PO) Take 3 capsules by mouth in the morning and at bedtime.   Yes [provider]  Probiotic Product (PROBIOTIC PO) Take  1 capsule by mouth daily.   Yes [provider]  QUERCETIN PO Take 1,000 mg by mouth daily.   Yes [provider]  Vitamin D-Vitamin K (VITAMIN K2-VITAMIN D3 PO) Take 250 mcg by mouth daily.   Yes [provider]  Zinc 50 MG TABS Take 1 tablet by mouth daily.   Yes [provider]  ascorbic acid (VITAMIN C) 500 MG tablet Take 1 tablet (500 mg total) by mouth daily. Patient not taking: No sig reported 01/29/20   Nolberto Hanlon, MD  zinc sulfate 220 (50 Zn) MG capsule Take 1 capsule (220 mg total) by mouth daily. Patient not taking: No sig reported 01/13/20   Max Sane, MD    Inpatient Medications: Scheduled Meds:  aspirin EC  81 mg Oral Daily   atorvastatin  80 mg Oral Daily   Chlorhexidine Gluconate Cloth  6 each Topical Q0600   lisinopril  10 mg Oral Daily   metoprolol tartrate  12.5 mg Oral BID   pantoprazole  40 mg Oral Daily   Continuous Infusions:  sodium chloride     PRN Meds: acetaminophen **OR** acetaminophen (TYLENOL) oral liquid 160 mg/5 mL **OR** acetaminophen, labetalol, melatonin, senna-docusate  Allergies:   No Known Allergies  Social History:   Social History   Socioeconomic History   Marital status: Married    Spouse name: Not on file   Number of children: Not on file   Years of education: Not on file   Highest education level: Not on file  Occupational History   Not on file  Tobacco Use   Smoking status: Former    Packs/day: 0.50    Years: 25.00    Pack years: 12.50    Types: Cigarettes   Smokeless tobacco: Never  Vaping Use   Vaping Use: Not on file  Substance and Sexual Activity   Alcohol use: Yes    Alcohol/week: 7.0 standard drinks    Types: 7 Cans of beer per week   Drug use: Not Currently   Sexual activity: Yes  Other Topics Concern   Not on file  Social History Narrative   Not on file   Social Determinants of Health   Financial Resource Strain: Not on file  Food Insecurity: Not on file   Transportation Needs: Not on file  Physical Activity: Not on file  Stress: Not on file  Social Connections: Not on file  Intimate Partner Violence: Not on file    Family History:   Family History  Problem Relation Age of Onset   Stroke Mother    Stroke Father      ROS:  Please see the history of present illness.   All other ROS reviewed and negative.     Physical Exam/Data:   Vitals:   04/03/21 0638 04/03/21 0733 04/03/21 1150 04/03/21 1603  BP: 128/68 125/71 130/68 (!) 154/77  Pulse: (!) 57 60 (!) 59 68  Resp: '20 18 20 20  '$ Temp: 98.6 F (37 C) 97.7 F (36.5 C) (!) 97.5 F (36.4 C) 98.6 F (37 C)  TempSrc: Oral Oral Oral Oral  SpO2: 96% 97% 97% 99%    Intake/Output Summary (Last 24 hours) at 04/03/2021 1721 Last  data filed at 04/03/2021 0800 Gross per 24 hour  Intake 240 ml  Output --  Net 240 ml   Last 3 Weights 03/30/2021 01/19/2020 01/18/2020  Weight (lbs) 219 lb 6.4 oz 296 lb 296 lb  Weight (kg) 99.519 kg 134.265 kg 134.265 kg     There is no height or weight on file to calculate BMI.  General:  Well nourished, well developed, in no acute distress HEENT: sclera anicteric Neck: no JVD Vascular: No carotid bruits; distal pulses 2+ bilaterally Cardiac:  normal S1, S2; RRR; no murmurs, rubs, or gallops Lungs:  clear to auscultation bilaterally, no wheezing, rhonchi or rales  Abd: soft, nontender, no hepatomegaly  Ext: no edema Musculoskeletal:  No deformities, BUE and BLE strength normal and equal Skin: warm and dry  Neuro:  CNs 2-12 intact, no focal abnormalities noted Psych:  Normal affect   EKG:  The EKG was personally reviewed and demonstrates:  sinus rhythm, rate 61 bpm, LVH with repol abnormality, no STE/D Telemetry:  Telemetry was personally reviewed and demonstrates:  ~2.5 hour episode of atrial fibrillation with RVR (rate 130s) around 1:45am, then sinus rhythm since that time. Brief episode of what appears to be MAT yesterday afternoon  Relevant CV  Studies: Echocardiogram 03/31/21: 1. The aortic valve is tricuspid. Aortic valve regurgitation is moderate.  Aortic regurgitation PHT measures 436 msec.   2. Aortic dilatation noted. There is moderate dilatation of the ascending  aorta, measuring 48 mm.   3. Left ventricular ejection fraction, by estimation, is 60 to 65%. The  left ventricle has normal function. The left ventricle has no regional  wall motion abnormalities. There is mild concentric left ventricular  hypertrophy. Left ventricular diastolic  parameters are consistent with Grade I diastolic dysfunction (impaired  relaxation).   4. Right ventricular systolic function is normal. The right ventricular  size is normal. Tricuspid regurgitation signal is inadequate for assessing  PA pressure.   5. The mitral valve is normal in structure. Trivial mitral valve  regurgitation.   6. The inferior vena cava is normal in size with greater than 50%  respiratory variability, suggesting right atrial pressure of 3 mmHg.   Comparison(s): No prior Echocardiogram.   Laboratory Data:  High Sensitivity Troponin:   Recent Labs  Lab 03/30/21 1109  TROPONINIHS 12     Chemistry Recent Labs  Lab 03/30/21 0858 04/01/21 1538  NA 138 138  K 4.4 3.7  CL 108 106  CO2 25 26  GLUCOSE 112* 108*  BUN 20 15  CREATININE 1.03 1.00  CALCIUM 9.1 8.7*  GFRNONAA >60 >60  ANIONGAP 5 6    Recent Labs  Lab 03/30/21 0858  PROT 7.5  ALBUMIN 4.2  AST 29  ALT 20  ALKPHOS 63  BILITOT 1.1   Hematology Recent Labs  Lab 03/30/21 1616 03/30/21 2029 04/01/21 1538  WBC 7.2 7.0 6.0  RBC 4.62 4.87 4.39  HGB 13.3 13.6 12.4*  HCT 39.3 41.1 37.2*  MCV 85.1 84.4 84.7  MCH 28.8 27.9 28.2  MCHC 33.8 33.1 33.3  RDW 16.2* 16.1* 16.3*  PLT 182 196 195   BNPNo results for input(s): BNP, PROBNP in the last 168 hours.  DDimer No results for input(s): DDIMER in the last 168 hours.   Radiology/Studies:  CT HEAD WO CONTRAST  Result Date:  03/31/2021 CLINICAL DATA:  Follow-up examination for cerebral hemorrhage. EXAM: CT HEAD WITHOUT CONTRAST TECHNIQUE: Contiguous axial images were obtained from the base of the skull through the vertex  without intravenous contrast. COMPARISON:  Prior MRI from earlier the same day as well as previous CTs from 03/30/2021. FINDINGS: Brain: Intraparenchymal hematoma positioned at the right temporal lobe again seen, relatively similar in size and morphology as compared to prior. Mild localized edema is relatively similar without significant regional mass effect. Scattered small volume subarachnoid hemorrhage within the adjacent right temporal occipital region again noted, slightly decreased in conspicuity from prior. Four-5 mm intraparenchymal hemorrhage seen superiorly within the right parietal lobe again noted (series 3, image 29). Intraventricular extension with small volume blood within the occipital horns of both lateral ventricles, slightly decreased from prior. Overall ventricular size is relatively stable without overt hydrocephalus or trapping. Underlying scattered ischemic infarcts involving the right cerebral hemisphere noted, most pronounced at the posterior right frontal region/precentral gyrus and right occipital lobe. Changes better evaluated on recent MRI. Additional subcentimeter left-sided infarct noted on prior MRI not visible by CT. No other new large vessel territory infarct. No mass lesion or extra-axial fluid collection. Underlying atrophy with chronic microvascular ischemic disease again noted. Vascular: No hyperdense vessel. Skull: Scalp soft tissues and calvarium are stable and within normal limits. Sinuses/Orbits: Right gaze noted. Globes and orbital soft tissues demonstrate no other acute finding. Scattered mucosal thickening noted within the ethmoidal air cells. Paranasal sinuses are otherwise clear. No significant mastoid effusion. Other: None. IMPRESSION: 1. No significant interval change in  size and morphology of right temporal lobe intraparenchymal hematoma. Similar mild localized edema without significant regional mass effect. 2. Slight interval decrease in conspicuity of scattered small volume subarachnoid hemorrhage within the adjacent right temporoccipital region. 3. Slight interval decrease in volume of intraventricular hemorrhage with stable ventricular size. 4. Scattered ischemic infarcts involving the right cerebral hemisphere, better evaluated on recent MRI. Additional subcentimeter left-sided infarct noted on prior MRI not visible by CT. 5. No other new acute intracranial abnormality. Electronically Signed   By: Jeannine Boga M.D.   On: 03/31/2021 05:25   CT HEAD WO CONTRAST  Result Date: 03/30/2021 CLINICAL DATA:  Cerebral hemorrhage suspected. EXAM: CT HEAD WITHOUT CONTRAST TECHNIQUE: Contiguous axial images were obtained from the base of the skull through the vertex without intravenous contrast. COMPARISON:  Head CT performed earlier today 03/30/2021. FINDINGS: Brain: Mild generalized cerebral atrophy. Since the head CT examination performed earlier today at 2:53 p.m., there has been an interval increase in size of an acute parenchymal hemorrhage within the right temporal lobe, now measuring 4.0 x 4.1 x 1.8 cm (previously measuring 3.6 x 3.1 x 1.9 cm). There is now intraventricular extension with small-to-moderate volume hemorrhage within the right lateral ventricle, and small-volume hemorrhage within the occipital horn of the left lateral ventricle. No evidence of hydrocephalus at this time. Small-volume acute subarachnoid hemorrhage overlying the right temporal and occipital lobes has not significantly changed. New 3 mm acute parenchymal hemorrhage or focus of subarachnoid hemorrhage along the high right frontoparietal lobes (series 2, image 29). Interval demarcation of an acute cortical/subcortical infarct within the right occipital lobe within the right PCA territory. This  measures 3.3 x 2.8 cm in transaxial dimensions (for instance as seen on series 2, image 11). No midline shift. Vascular: Redemonstrated punctate calcific foci along the right cerebral hemisphere in the right MCA and right PCA territories, likely reflecting calcified emboli. Skull: No calvarial fracture. Redemonstrated nonspecific thinning of the left parietal calvarium. Sinuses/Orbits: Visualized orbits show no acute finding. Mild mucosal thickening within a posterior right ethmoid air cell. These results were called by telephone at  the time of interpretation on 03/30/2021 at 8:16 pm to provider Dr. Rory Percy, who verbally acknowledged these results. IMPRESSION: Interval increase in size of an acute parenchymal hemorrhage within the right temporal lobe, now measuring 4.0 x 4.1 x 1.8 cm. Interval extension of hemorrhage into the ventricular system with small-to-moderate volume hemorrhage in the right lateral ventricle, and small-volume hemorrhage in the occipital horn of the left lateral ventricle. No evidence of hydrocephalus at this time. Small-volume subarachnoid hemorrhage overlying the right temporal and occipital lobes has not significantly changed. New 3 mm acute parenchymal hemorrhage versus focus of subarachnoid hemorrhage along the high right frontoparietal lobes. Interval demarcation of an acute cortical/subcortical infarct within the right occipital lobe (PCA vascular territory), measuring 3.3 x 2.8 cm in transaxial dimensions. Electronically Signed   By: Kellie Simmering DO   On: 03/30/2021 20:26   MR BRAIN WO CONTRAST  Result Date: 03/31/2021 CLINICAL DATA:  Initial evaluation for acute headache, intracranial hemorrhage. EXAM: MRI HEAD WITHOUT CONTRAST TECHNIQUE: Multiplanar, multiecho pulse sequences of the brain and surrounding structures were obtained without intravenous contrast. COMPARISON:  Prior CTs from 03/30/21. FINDINGS: Brain: Examination technically limited as the patient was unable to tolerate  the full length of the study. Diffusion and sagittal T1 weighted sequences only were performed. Additionally, provided images are moderately degraded by motion. Multifocal ischemic infarcts involving the right frontal, parietal, temporal, and occipital lobes are seen, right MCA and PCA distributions. Largest areas of involvement seen at the posterior right frontal region at the level of the precentral gyrus (series 3, image 40). Additional moderate size confluent area of involvement at the right occipital lobe, measuring up to 3.5 cm in size. Mild patchy involvement of the right hippocampus. Few additional scattered subcentimeter infarcts noted involving the periventricular and deep white matter. Superimposed intraparenchymal hematoma at the right temporal lobe is grossly similar, measuring approximately 4.1 x 3.2 cm on this limited exam. No visible underlying lesion. Scattered small volume subarachnoid hemorrhage within the posterior right temporal occipital region is grossly similar as well. Known intraventricular extension not well seen on this exam. No significant regional mass effect or midline shift. No hydrocephalus or trapping. Basilar cisterns remain patent. Additional note made of a 7 mm ischemic infarct involving the mesial left occipital lobe (series 3, image 19). No other visible acute intracranial abnormality seen on this limited exam. No visible mass lesion or extra-axial fluid collection. Vascular: Not assessed on this limited study. Skull and upper cervical spine: Craniocervical junction within normal limits. Bone marrow signal intensity normal. No scalp soft tissue abnormality. Sinuses/Orbits: Grossly negative on this limited exam. Other: None. IMPRESSION: 1. Technically limited exam due to patient's inability to tolerate the full length of the study and motion artifact. Diffusion and sagittal T1 weighted sequences only were performed. 2. Multifocal ischemic infarcts involving the right cerebral  hemisphere as above, right MCA and PCA distributions. 3. Grossly similar 4.1 x 3.2 cm intraparenchymal hematoma involving the right temporal lobe. Scattered small volume subarachnoid hemorrhage grossly similar as well. Known intraventricular extension not well seen on this exam. No significant regional mass effect or midline shift. No hydrocephalus or trapping. 4. Additional 7 mm acute ischemic infarct involving the mesial left occipital lobe. Electronically Signed   By: Jeannine Boga M.D.   On: 03/31/2021 03:49   DG Swallowing Func-Speech Pathology  Result Date: 04/01/2021 Formatting of this result is different from the original. Objective Swallowing Evaluation: Type of Study: MBS-Modified Barium Swallow Study  Patient Details Name: Tyvell  Dardar MRN: [id number] Date of Birth: 1945/09/05 Today's Date: 04/01/2021 Time: SLP Start Time (ACUTE ONLY): 1053 -SLP Stop Time (ACUTE ONLY): 1117 SLP Time Calculation (min) (ACUTE ONLY): 24 min Past Medical History: Past Medical History: Diagnosis Date  Pneumonia due to COVID-19 virus 01/10/2020 Past Surgical History: Past Surgical History: Procedure Laterality Date  HERNIA REPAIR   HPI: 75 yo male s/p admitted to [facility] with L hand weakness and L facial droop.  He is  s/p tPA with worsening of symptoms  post admistration.  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 Received reversal medication. PMHx: COVID PNA with ongoing issues with SHOB.  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  Patient reports seeing ST for services at the [facility] to address swallowing defiicts post treatment.  Subjective: pt alert and cooperative with L sided deficits noted Assessment / Plan / Recommendation CHL IP CLINICAL IMPRESSIONS 04/01/2021 Clinical Impression Pt has a mild oropharyngeal dysphagia that may be a combination of acute  neurological changes paired with mild, chronic dysphagia s/p XRT. He has reduced labial seal that results in mild L-sided anterior loss with thin liquids, with pt showing some awareness by asking intermittently for his washcloth to wipe his mouth. He has some reduced lingual coordination with thin liquids as well, appearing more functional with thicker liquids and purees. With small, single sips this is more controlled, but when he starts to get a little more volume or if he takes consecutive boluses, his pharyngeal timing is also impacted and he has aspiration before the swallow. This was sensed by either a throat clear or a cough, neither of which were effective at clearing the airway. A chin tuck resulted in mildly increased pharyngeal residuals (pt also has mild oral residue with thin liquids that spills posteriorly after the swallow), which were aspirated after the swallow, and this aspiration was silent. A head turn to the L seemed to be the most protective of his airway during this study. Pt also had improved airway protection and more efficiency with swallowing given nectar thick liquids and purees. Mild-moderate vallecular residue was present with regular solids. Recommend starting Dys 3 diet and nectar thick liquids. Pt could still have a few pieces of ice after oral care is completed. SLP will f/u for potential to utilize head turn left as a compensatory strategy to facilitate return to thin liquids. SLP Visit Diagnosis Dysphagia, oropharyngeal phase (R13.12) Attention and concentration deficit following -- Frontal lobe and executive function deficit following -- Impact on safety and function Mild aspiration risk;Moderate aspiration risk   CHL IP TREATMENT RECOMMENDATION 04/01/2021 Treatment Recommendations Therapy as outlined in treatment plan below   Prognosis 04/01/2021 Prognosis for Safe Diet Advancement Good Barriers to Reach Goals Cognitive deficits Barriers/Prognosis Comment -- CHL IP DIET  RECOMMENDATION 04/01/2021 SLP Diet Recommendations Dysphagia 3 (Mech soft) solids;Nectar thick liquid Liquid Administration via Cup;Straw Medication Administration Whole meds with puree Compensations Minimize environmental distractions;Slow rate;Small sips/bites Postural Changes Seated upright at 90 degrees;Remain semi-upright after after feeds/meals (Comment)   CHL IP OTHER RECOMMENDATIONS 04/01/2021 Recommended Consults -- Oral Care Recommendations Oral care BID Other Recommendations Order thickener from pharmacy;Prohibited food (jello, ice cream, thin soups);Remove water pitcher   CHL IP FOLLOW UP RECOMMENDATIONS 04/01/2021 Follow up Recommendations Inpatient Rehab   CHL IP FREQUENCY AND DURATION 04/01/2021 Speech Therapy Frequency (ACUTE ONLY) min 2x/week  Treatment Duration 2 weeks      CHL IP ORAL PHASE 04/01/2021 Oral Phase Impaired Oral - Pudding Teaspoon -- Oral - Pudding Cup -- Oral - Honey Teaspoon -- Oral - Honey Cup -- Oral - Nectar Teaspoon -- Oral - Nectar Cup WFL Oral - Nectar Straw WFL Oral - Thin Teaspoon -- Oral - Thin Cup Lingual/palatal residue;Left anterior bolus loss Oral - Thin Straw Left anterior bolus loss;Lingual/palatal residue Oral - Puree WFL Oral - Mech Soft -- Oral - Regular WFL Oral - Multi-Consistency -- Oral - Pill -- Oral Phase - Comment --  CHL IP PHARYNGEAL PHASE 04/01/2021 Pharyngeal Phase Impaired Pharyngeal- Pudding Teaspoon -- Pharyngeal -- Pharyngeal- Pudding Cup -- Pharyngeal -- Pharyngeal- Honey Teaspoon -- Pharyngeal -- Pharyngeal- Honey Cup -- Pharyngeal -- Pharyngeal- Nectar Teaspoon -- Pharyngeal -- Pharyngeal- Nectar Cup Pharyngeal residue - valleculae Pharyngeal -- Pharyngeal- Nectar Straw Pharyngeal residue - valleculae Pharyngeal -- Pharyngeal- Thin Teaspoon -- Pharyngeal -- Pharyngeal- Thin Cup Penetration/Aspiration before swallow;Pharyngeal residue - valleculae;Pharyngeal residue - pyriform Pharyngeal Material enters airway, passes BELOW cords and not ejected out  despite cough attempt by patient Pharyngeal- Thin Straw Penetration/Aspiration before swallow;Penetration/Apiration after swallow;Compensatory strategies attempted (with notebox);Pharyngeal residue - valleculae;Pharyngeal residue - pyriform Pharyngeal Material enters airway, passes BELOW cords and not ejected out despite cough attempt by patient;Material enters airway, passes BELOW cords without attempt by patient to eject out (silent aspiration) Pharyngeal- Puree WFL Pharyngeal -- Pharyngeal- Mechanical Soft -- Pharyngeal -- Pharyngeal- Regular Reduced tongue base retraction;Pharyngeal residue - valleculae Pharyngeal -- Pharyngeal- Multi-consistency -- Pharyngeal -- Pharyngeal- Pill -- Pharyngeal -- Pharyngeal Comment --  CHL IP CERVICAL ESOPHAGEAL PHASE 04/01/2021 Cervical Esophageal Phase Impaired Pudding Teaspoon -- Pudding Cup -- Honey Teaspoon -- Honey Cup -- Nectar Teaspoon -- Nectar Cup Reduced cricopharyngeal relaxation Nectar Straw Reduced cricopharyngeal relaxation Thin Teaspoon -- Thin Cup Reduced cricopharyngeal relaxation Thin Straw Reduced cricopharyngeal relaxation Puree Reduced cricopharyngeal relaxation Mechanical Soft -- Regular Reduced cricopharyngeal relaxation Multi-consistency -- Pill -- Cervical Esophageal Comment -- Osie Bond., M.A. CCC-SLP [facility] Pager [phone] Office [phone] 04/01/2021, 1:00 PM              ECHOCARDIOGRAM COMPLETE  Result Date: 03/31/2021    ECHOCARDIOGRAM REPORT   Patient Name:   Davius Baillie  Date of Exam: 03/31/2021 Medical Rec #:  [id number]  Height:       73.0 in Accession #:    [id number] Weight:       219.4 lb Date of Birth:  13-Dec-1945  BSA:          2.238 m Patient Age:    5 years   BP:           129/68 mmHg Patient Gender: M          HR:           71 bpm. Exam Location:  Inpatient Procedure: 2D Echo, 3D Echo, Cardiac Doppler and Color Doppler Indications:    Stroke  History:        Patient has no prior history of Echocardiogram  examinations.                 Stroke. Pulmonary embolus.  Sonographer:    Roseanna Rainbow RDCS Referring Phys: [id number] ERIC LINDZEN  Sonographer Comments: Technically difficult study due to poor echo windows and suboptimal subcostal window. Patient could not follow directions to hold his breath. IMPRESSIONS  1. The aortic valve is tricuspid. Aortic valve regurgitation is moderate. Aortic regurgitation PHT measures  436 msec.  2. Aortic dilatation noted. There is moderate dilatation of the ascending aorta, measuring 48 mm.  3. Left ventricular ejection fraction, by estimation, is 60 to 65%. The left ventricle has normal function. The left ventricle has no regional wall motion abnormalities. There is mild concentric left ventricular hypertrophy. Left ventricular diastolic parameters are consistent with Grade I diastolic dysfunction (impaired relaxation).  4. Right ventricular systolic function is normal. The right ventricular size is normal. Tricuspid regurgitation signal is inadequate for assessing PA pressure.  5. The mitral valve is normal in structure. Trivial mitral valve regurgitation.  6. The inferior vena cava is normal in size with greater than 50% respiratory variability, suggesting right atrial pressure of 3 mmHg. Comparison(s): No prior Echocardiogram. Conclusion(s)/Recommendation(s): No intracardiac source of embolism detected on this transthoracic study. A transesophageal echocardiogram is recommended to exclude cardiac source of embolism if clinically indicated. FINDINGS  Left Ventricle: Left ventricular ejection fraction, by estimation, is 60 to 65%. The left ventricle has normal function. The left ventricle has no regional wall motion abnormalities. The left ventricular internal cavity size was normal in size. There is  mild concentric left ventricular hypertrophy. Left ventricular diastolic parameters are consistent with Grade I diastolic dysfunction (impaired relaxation). Right Ventricle: The right  ventricular size is normal. Right vetricular wall thickness was not well visualized. Right ventricular systolic function is normal. Tricuspid regurgitation signal is inadequate for assessing PA pressure. Left Atrium: Left atrial size was normal in size. Right Atrium: Right atrial size was normal in size. Pericardium: There is no evidence of pericardial effusion. Mitral Valve: The mitral valve is normal in structure. Trivial mitral valve regurgitation. Tricuspid Valve: The tricuspid valve is grossly normal. Tricuspid valve regurgitation is trivial. Aortic Valve: The aortic valve is tricuspid. Aortic valve regurgitation is moderate. Aortic regurgitation PHT measures 436 msec. Pulmonic Valve: The pulmonic valve was grossly normal. Pulmonic valve regurgitation is mild. No evidence of pulmonic stenosis. Aorta: Aortic dilatation noted. There is moderate dilatation of the ascending aorta, measuring 48 mm. Venous: The inferior vena cava is normal in size with greater than 50% respiratory variability, suggesting right atrial pressure of 3 mmHg. IAS/Shunts: The atrial septum is grossly normal.  LEFT VENTRICLE PLAX 2D LVIDd:         5.10 cm      Diastology LVIDs:         3.60 cm      LV e' medial:    5.55 cm/s LV PW:         1.20 cm      LV E/e' medial:  17.9 LV IVS:        1.30 cm      LV e' lateral:   12.70 cm/s LVOT diam:     2.10 cm      LV E/e' lateral: 7.8 LV SV:         92 LV SV Index:   41 LVOT Area:     3.46 cm  LV Volumes (MOD) LV vol d, MOD A2C: 142.0 ml LV vol d, MOD A4C: 159.0 ml LV vol s, MOD A2C: 40.6 ml LV vol s, MOD A4C: 65.2 ml LV SV MOD A2C:     101.4 ml LV SV MOD A4C:     159.0 ml LV SV MOD BP:      96.3 ml RIGHT VENTRICLE RV S prime:     18.50 cm/s TAPSE (M-mode): 1.6 cm LEFT ATRIUM  Index       RIGHT ATRIUM          Index LA diam:        3.30 cm 1.47 cm/m  RA Area:     9.72 cm LA Vol (A2C):   55.7 ml 24.89 ml/m RA Volume:   16.30 ml 7.28 ml/m LA Vol (A4C):   69.8 ml 31.19 ml/m LA Biplane  Vol: 63.6 ml 28.42 ml/m  AORTIC VALVE             PULMONIC VALVE LVOT Vmax:   136.00 cm/s PR End Diast Vel: 1.74 msec LVOT Vmean:  87.350 cm/s LVOT VTI:    0.266 m AI PHT:      436 msec  AORTA Ao Root diam: 4.30 cm Ao Asc diam:  4.70 cm MITRAL VALVE MV Area (PHT): 2.50 cm     SHUNTS MV Decel Time: 303 msec     Systemic VTI:  0.27 m MV E velocity: 99.20 cm/s   Systemic Diam: 2.10 cm MV A velocity: 101.00 cm/s MV E/A ratio:  0.98 Rudean Haskell MD Electronically signed by Rudean Haskell MD Signature Date/Time: 03/31/2021/12:40:57 PM    Final    VAS [place] LOWER EXTREMITY VENOUS (DVT)  Result Date: 04/03/2021  Lower Venous DVT Study Patient Name:  Vin Obryant  Date of Exam:   04/03/2021 Medical Rec #: [id number]  Accession #:    [id number] Date of Birth: 1946/08/25  Patient Gender: M Patient Age:   75Y Exam Location:  [facility] Procedure:      VAS [place] LOWER EXTREMITY VENOUS (DVT) Referring Phys: [id number] PRAMOD S SETHI --------------------------------------------------------------------------------  Indications: Phlebitis/throbophlebitis.  Comparison Study: no prior Performing Technologist: Archie Patten RVS  Examination Guidelines: A complete evaluation includes B-mode imaging, spectral Doppler, color Doppler, and power Doppler as needed of all accessible portions of each vessel. Bilateral testing is considered an integral part of a complete examination. Limited examinations for reoccurring indications may be performed as noted. The reflux portion of the exam is performed with the patient in reverse Trendelenburg.  +---------+---------------+---------+-----------+----------+--------------+ RIGHT    CompressibilityPhasicitySpontaneityPropertiesThrombus Aging +---------+---------------+---------+-----------+----------+--------------+ CFV      Full           Yes      Yes                                 +---------+---------------+---------+-----------+----------+--------------+ SFJ      Full                                                         +---------+---------------+---------+-----------+----------+--------------+ FV Prox  Full                                                        +---------+---------------+---------+-----------+----------+--------------+ FV Mid   Full                                                        +---------+---------------+---------+-----------+----------+--------------+  FV DistalFull                                                        +---------+---------------+---------+-----------+----------+--------------+ PFV      Full                                                        +---------+---------------+---------+-----------+----------+--------------+ POP      Full           Yes      Yes                                 +---------+---------------+---------+-----------+----------+--------------+ PTV      Full                                                        +---------+---------------+---------+-----------+----------+--------------+ PERO     Full                                                        +---------+---------------+---------+-----------+----------+--------------+   +---------+---------------+---------+-----------+----------+--------------+ LEFT     CompressibilityPhasicitySpontaneityPropertiesThrombus Aging +---------+---------------+---------+-----------+----------+--------------+ CFV      Full           Yes      Yes                                 +---------+---------------+---------+-----------+----------+--------------+ SFJ      Full                                                        +---------+---------------+---------+-----------+----------+--------------+ FV Prox  Full                                                        +---------+---------------+---------+-----------+----------+--------------+ FV Mid   Full                                                         +---------+---------------+---------+-----------+----------+--------------+ FV DistalFull                                                        +---------+---------------+---------+-----------+----------+--------------+  PFV      Full                                                        +---------+---------------+---------+-----------+----------+--------------+ POP      Full           Yes      Yes                                 +---------+---------------+---------+-----------+----------+--------------+ PTV      Full                                                        +---------+---------------+---------+-----------+----------+--------------+ PERO     Full                                                        +---------+---------------+---------+-----------+----------+--------------+     Summary: BILATERAL: - No evidence of deep vein thrombosis seen in the lower extremities, bilaterally. -   *See table(s) above for measurements and observations. Electronically signed by Deitra Mayo MD on 04/03/2021 at 5:02:33 PM.    Final      Assessment and Plan:   1. 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 No prior history of Afib. Echo this admission with EF 60-65%, G1DD, mild LVH, no RWMA, moderate AI, and moderate dilation of the ascending aorta (83m). Early on the morning of 04/03/21 patient went into atrial fibrillation with intermittent RVR with rates up to 130s. He was started on metoprolol for rate control. This is likely the etiology of his initial stroke. Given hemorrhagic conversion, anticoagulation deferred at this time and he was started on aspirin '81mg'$  daily. Overall seems asymptomatic.  - CHA2DS2-VASc Score = 5 [CHF History: No, HTN History: Yes, Diabetes History: No, Stroke History: Yes, Vascular Disease History: Yes, Age Score: 1, Gender Score: 0].   Therefore, the patient's annual risk of stroke is 7.2 %.    - He will ultimately require anticoagulation once cleared by neurology pending ongoing recovery from his hemorrhagic stroke - recommend eliquis '5mg'$  BID to replace aspirin '81mg'$  daily - Continue metoprolol tartrate 12.'5mg'$  BID for rate control - Would benefit from an ischemic evaluation outpatient - consider coronary CTA vs NST given no anginal complaints - Will check TSH in AM - Could consider outpatient sleep study  2. HTN: BP intermittently elevated. Allowing permissive HTN in the setting of acute CVA - Continue metoprolol for now  3. HLD: LDL 184 this admission. Started on atorvastatin '80mg'$  daily. Goal LDL <70 in light of CVA and coronary artery calcifications - Continue atorvastatin '80mg'$   - Will need repeat FLP/LFTs in 6-8 weeks  4. Acute CVA: presented with left hand weakness and facial drooping. [facility] tPA and [facility]  - Continue management per neurology.  5. Coronary artery  calcifications: noted to have moderate calcifications on CT scan in 2021. Had isolated episode of chest pressure 1 week ago which resolved spontaneously - Consider outpatient NST vs coronary CTA to r/o ischemia - Continue aggressive risk factor modifications to promote BP goal <130/80, LDL <70, A1C <7  6. Aortic regurgitation: moderate on echo - Continue routine outpatient monitoring  7. Ascending aortic aneurysm: 56m on echo this admission - Continue routine outpatient monitoring    Risk Assessment/Risk Scores:          CHA2DS2-VASc Score = 5  {This indicates a 7.2% annual risk of stroke. The patient's score is based upon: CHF History: No HTN History: Yes Diabetes History: No Stroke History: Yes Vascular Disease History: Yes Age Score: 1 Gender Score: 0       For questions or updates, please contact CHuetterPlease consult [email or web address] for contact info  under    Signed, KAbigail Butts PA-C  04/03/2021 5:21 PM   Agree with assessment and plan by KRoby LoftsPA-C  Mr. APetrichis a fit appearing 75year old married Caucasian [facility] presumably embolic.  He has no prior history of heart disease or arrhythmia.  He does have a history of hypertension, hyperlipidemia and recently diagnosed thoracic aortic aneurysm with moderate AI.  He apparently had chest pain while hiking a week ago.  He received tPA on admission and apparently had hemorrhagic transformation.  There was atrial fibrillation seen on telemetry we are asked to consult.  He is currently in sinus rhythm.  Exam is benign.  He does have some visual impairment.  Question will be when to initiate oral anticoagulation in the setting of hemorrhagic transformation and recent stroke.  We will do for to the treating neurologist for timing.  In the meantime, aspirin was begun as was a beta-blocker.  We will follow along with you.  JLorretta Harp M.D., FFerguson FAnmed Health [facility] FLaverta BaltimoreFSchuyler[address] [place]  [id number] [phone]/10/2020 5:46 PM

## 2021-04-03 NOTE — Plan of Care (Signed)
  Problem: Education: Goal: Knowledge of disease or condition will improve Outcome: Progressing Goal: Knowledge of secondary prevention will improve Outcome: Progressing Goal: Knowledge of patient specific risk factors addressed and post discharge goals established will improve Outcome: Progressing Goal: Individualized Educational Video(s) Outcome: Progressing   Problem: Coping: Goal: Will identify appropriate support needs Outcome: Progressing   Problem: Ischemic Stroke/TIA Tissue Perfusion: Goal: Complications of ischemic stroke/TIA will be minimized Outcome: Progressing

## 2021-04-03 NOTE — Progress Notes (Signed)
Patient ID: Geoffrey Wells, male   DOB: Jun 01, 1946, 75 y.o.   MRN: [id number] STROKE TEAM PROGRESS NOTE   INTERVAL HISTORY Patient is sitting in a bedside chair.  His wife is at the bedside..  Patient developed atrial fibrillation early this morning with variable heart rate between 90-1 30s.  He has been started on metoprolol.  Blood pressure adequately controlled.  Vital signs stable.    Lower extremity venous Dopplers does not show any evidence of deep vein thrombosis.  Vitals:   04/03/21 0327 04/03/21 0638 04/03/21 0733 04/03/21 1150  BP:  128/68 125/71 130/68  Pulse: 72 (!) 57 60 (!) 59  Resp:  '20 18 20  '$ Temp: 97.9 F (36.6 C) 98.6 F (37 C) 97.7 F (36.5 C) (!) 97.5 F (36.4 C)  TempSrc: Oral Oral Oral Oral  SpO2: 98% 96% 97% 97%   CBC:  Recent Labs  Lab 03/30/21 0858 03/30/21 1616 03/30/21 2029 04/01/21 1538  WBC 5.3   < > 7.0 6.0  NEUTROABS 3.2  --   --   --   HGB 13.5   < > 13.6 12.4*  HCT 39.8   < > 41.1 37.2*  MCV 84.7   < > 84.4 84.7  PLT 182   < > 196 195   < > = values in this interval not displayed.   Basic Metabolic Panel:  Recent Labs  Lab 03/30/21 0858 04/01/21 1538  NA 138 138  K 4.4 3.7  CL 108 106  CO2 25 26  GLUCOSE 112* 108*  BUN 20 15  CREATININE 1.03 1.00  CALCIUM 9.1 8.7*   Lipid Panel:  Recent Labs  Lab 03/30/21 2029  CHOL 269*  TRIG 89  HDL 67  CHOLHDL 4.0  VLDL 18  LDLCALC 184*   HgbA1c:  Recent Labs  Lab 03/30/21 2029  HGBA1C 5.8*   Urine Drug Screen: No results for input(s): LABOPIA, COCAINSCRNUR, LABBENZ, AMPHETMU, THCU, LABBARB in the last 168 hours.  Alcohol Level No results for input(s): ETH in the last 168 hours.  IMAGING past 24 hours VAS [place] LOWER EXTREMITY VENOUS (DVT)  Result Date: 04/03/2021  Lower Venous DVT Study Patient Name:  Geoffrey Wells  Date of Exam:   04/03/2021 Medical Rec #: [id number]  Accession #:    [id number] Date of Birth: [birth year]  Patient Gender: M Patient Age:   75Y Exam Location:  [facility]  Procedure:      VAS [place] LOWER EXTREMITY VENOUS (DVT) Referring Phys: [id number] Azura Tufaro S Zylah Elsbernd --------------------------------------------------------------------------------  Indications: Phlebitis/throbophlebitis.  Comparison Study: no prior Performing Technologist: Archie Patten RVS  Examination Guidelines: A complete evaluation includes B-mode imaging, spectral Doppler, color Doppler, and power Doppler as needed of all accessible portions of each vessel. Bilateral testing is considered an integral part of a complete examination. Limited examinations for reoccurring indications may be performed as noted. The reflux portion of the exam is performed with the patient in reverse Trendelenburg.  +---------+---------------+---------+-----------+----------+--------------+ RIGHT    CompressibilityPhasicitySpontaneityPropertiesThrombus Aging +---------+---------------+---------+-----------+----------+--------------+ CFV      Full           Yes      Yes                                 +---------+---------------+---------+-----------+----------+--------------+ SFJ      Full                                                        +---------+---------------+---------+-----------+----------+--------------+  FV Prox  Full                                                        +---------+---------------+---------+-----------+----------+--------------+ FV Mid   Full                                                        +---------+---------------+---------+-----------+----------+--------------+ FV DistalFull                                                        +---------+---------------+---------+-----------+----------+--------------+ PFV      Full                                                        +---------+---------------+---------+-----------+----------+--------------+ POP      Full           Yes      Yes                                  +---------+---------------+---------+-----------+----------+--------------+ PTV      Full                                                        +---------+---------------+---------+-----------+----------+--------------+ PERO     Full                                                        +---------+---------------+---------+-----------+----------+--------------+   +---------+---------------+---------+-----------+----------+--------------+ LEFT     CompressibilityPhasicitySpontaneityPropertiesThrombus Aging +---------+---------------+---------+-----------+----------+--------------+ CFV      Full           Yes      Yes                                 +---------+---------------+---------+-----------+----------+--------------+ SFJ      Full                                                        +---------+---------------+---------+-----------+----------+--------------+ FV Prox  Full                                                        +---------+---------------+---------+-----------+----------+--------------+  FV Mid   Full                                                        +---------+---------------+---------+-----------+----------+--------------+ FV DistalFull                                                        +---------+---------------+---------+-----------+----------+--------------+ PFV      Full                                                        +---------+---------------+---------+-----------+----------+--------------+ POP      Full           Yes      Yes                                 +---------+---------------+---------+-----------+----------+--------------+ PTV      Full                                                        +---------+---------------+---------+-----------+----------+--------------+ PERO     Full                                                         +---------+---------------+---------+-----------+----------+--------------+     Summary: BILATERAL: - No evidence of deep vein thrombosis seen in the lower extremities, bilaterally. -   *See table(s) above for measurements and observations.    Preliminary     PHYSICAL EXAM General: Appears well-developed. No acute distress Psych: Affect appropriate to situation Eyes: No scleral injection HENT: No OP obstrucion Head: Normocephalic.  Cardiovascular: Normal rate and regular rhythm.  Respiratory: Effort normal and breath sounds normal to anterior ascultation GI: Soft.  No distension. There is no tenderness.  Skin: WDI    Neurological Examination Mental Status: Alert, awake. Interactive. Oriented to person, place (hospital) and year, month.Speech fluent without evidence of aphasia, but is moderately dysarthric. Able to follow 3 step commands without difficulty. Cranial Nerves: II: Left dense homonomous hemianopia  III,IV, VI: ptosis not present, extra-ocular motions intact bilaterally, pupils equal, round, reactive to light and accommodation V,VII: mild left facial droop, facial light touch sensation normal bilaterally VIII: hearing normal bilaterally IX,X: uvula rises symmetrically XI: bilateral shoulder shrug XII: midline tongue extension Motor: Right : Upper extremity   5/5    Left:     Upper extremity   5/5  Lower extremity   5/5     Lower extremity   5/5 Tone and bulk:normal tone throughout; no atrophy noted Sensory: Pinprick and light touch intact throughout, bilaterally Deep Tendon Reflexes: 2+ and symmetric  throughout Plantars: Right: downgoing   Left: downgoing Cerebellar: normal finger-to-nose, normal rapid alternating movements and normal heel-to-shin test Gait: normal gait and station   ASSESSMENT/PLAN Geoffrey Wells is a 75 y.o. male with history of hypertension who presented to [facility] ED with left sided weakness and was found to have a right PCA occlusion complicated by  subsequent development of subarachnoid (Hunt & Hess Grade 1) and intraparenchymal hemorrhaging after receiving tPA. Now status post TXA and 10U cryoprecipitate administration. He was subsequently transferred to [facility] for ongoing evaluation and monitoring.  Stroke: RMCA-likely cardioembolic from new onset atrial fibrillation Code Stroke CT head No acute abnormality. Small vessel disease. Atrophy. ASPECTS 10.  Post tPA: small volume ASH Acute parenchymal hemorrhage within posterior right temporal lobe, still small vol SAH. Evolving Right occipital infarct. CTA head & neck stenosis of left A2 ACA, congenitally small caliber right vertebral artery. F/u CTA: occlusion of P3 MRI  Multifocal ischemic infarcts involving the right cerebral. Additional 7 mm acute ischemic infarct involving the mesial left occipital lobe hemisphere, right MCA and PCA distributions. 2D Echo completed. No thrombus. EF 65%. Consider TEE when stable. LDL 184 HgbA1c 5.8 VTE prophylaxis - SCDs Diet: advated to D3 soft mechanical diet today aspirin 81 mg daily prior to admission, now on  none d/t bleed .  Therapy recommendations: CIR recommended Disposition:  possible CIR  Hypertension Home meds:  [name] '20mg'$  Stable Blood pressure goal: SBP 120-152mHg. Stable overnight. Cleviprex drip stopped. Prn labetalol for now. Long-term BP goal normotensive  Hyperlipidemia Home meds:  none,  LDL 184, goal < 70 Add Lipitor '80mg'$  qhs  High intensity statin  Continue statin at discharge  Other Stroke Risk Factors Advanced Age >/= 628 Former Cigarette smoker  Plan : Cardiology consult for new onset atrial fibrillation.  Metoprolol for rate control..  Physical Occupational Therapy and Rehab consults.  Continue dysphagia diet.   We will have to hold his anticoagulation for atrial fibrillation at least for a month given his post tPA hemorrhage but will start him on aspirin 81 mg daily.  Long discussion patient and wife and  answered questions.  Greater than 50% time during this 25-minute visit was spent on counseling and coordination of care about his stroke and post tPA hemorrhage and atrial fibrillation and answering questions.   PAntony Contras MGreenport West Hospitalday # 4   To contact Stroke Continuity provider, please refer to [email or web address] After hours, contact General Neurology

## 2021-04-03 NOTE — Plan of Care (Signed)
Patient is alert oriented x 4. Pt has left sided weakness, left facial droop.  Pt is HOH, has hearing aids at bedside in charger. Pt has cell phone and charger with him in the bed. Pt has his personal belonging bags in the closet. Pt has ambulated to bathroom using walker with +1 assistance with adls.  No distress noted.    Problem: Education: Goal: Knowledge of disease or condition will improve Outcome: Progressing Goal: Knowledge of secondary prevention will improve Outcome: Progressing Goal: Knowledge of patient specific risk factors addressed and post discharge goals established will improve Outcome: Progressing Goal: Individualized Educational Video(s) Outcome: Progressing   Problem: Coping: Goal: Will identify appropriate support needs Outcome: Progressing   Problem: Health Behavior/Discharge Planning: Goal: Ability to manage health-related needs will improve Outcome: Progressing   Problem: Self-Care: Goal: Ability to participate in self-care as condition permits will improve Outcome: Progressing Goal: Verbalization of feelings and concerns over difficulty with self-care will improve Outcome: Progressing Goal: Ability to communicate needs accurately will improve Outcome: Progressing   Problem: Nutrition: Goal: Risk of aspiration will decrease Outcome: Progressing   Problem: Intracerebral Hemorrhage Tissue Perfusion: Goal: Complications of Intracerebral Hemorrhage will be minimized Outcome: Progressing   Problem: Ischemic Stroke/TIA Tissue Perfusion: Goal: Complications of ischemic stroke/TIA will be minimized Outcome: Progressing

## 2021-04-03 NOTE — Progress Notes (Signed)
Telemetry called that patient had converted from normal sinus rhythm to atrial fibrillation. HR went up to 130's but back down 90-100's. Provider paged to inform. Mews score is yellow due to heart rate. Provider paged to inform of this change in status. Provider stated he would add a medication for heart rate control.

## 2021-04-03 NOTE — Progress Notes (Signed)
Medications  given in puree, water offered, given. Pt resting.

## 2021-04-03 NOTE — Progress Notes (Signed)
Physical Therapy Treatment Patient Details Name: Geoffrey Wells MRN: [id number] DOB: [birth year] Today's Date: 04/03/2021    History of Present Illness Pt is a 75 y/o male who presents s/p L hand weakness and L facial droop. 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 Received reversal medication. PMHx: COVID PNA.    PT Comments    Pt progressing towards physical therapy goals. Continues to demonstrate L side inattention and visual deficit, requiring multimodal cues for direction finding, object finding, and general safety. Pt mildly impulsive at times, requiring  cues and assist to correct LOB. Will continue to follow and progress as able per POC.    Follow Up Recommendations  CIR;Supervision/Assistance - 24 hour     Equipment Recommendations  None recommended by PT    Recommendations for Other Services Rehab consult     Precautions / Restrictions Precautions Precautions: Fall;Other (comment) Precaution Comments: L hemianopia, BP < 160 Restrictions Weight Bearing Restrictions: No    Mobility  Bed Mobility Overal bed mobility: Needs Assistance Bed Mobility: Supine to Sit     Supine to sit: Supervision     General bed mobility comments: Pt spontaneously initiated transition to EOB. Was able to complete without assistance however decreased safety awareness noted.    Transfers Overall transfer level: Needs assistance Equipment used: None Transfers: Sit to/from Stand Sit to Stand: Min guard;Min assist         General transfer comment: Close guard for safety but overall pt was able to power-up to full stand without assistance. Pt demonstrating decreased safety awareness, dropping things on the floor and attempting to bend down to pick them up in the middle of transferring to/from stand. Min assist provided during these times with cues for optimal  safety.  Ambulation/Gait Ambulation/Gait assistance: Min assist Gait Distance (Feet): 200 Feet Assistive device: None Gait Pattern/deviations: Step-through pattern;Decreased stride length;Drifts right/left Gait velocity: decreased Gait velocity interpretation: 1.31 - 2.62 ft/sec, indicative of limited community ambulator General Gait Details: Decreased L reciprocal arm swing, drifting towards and staying close to right side of wall, mod-max cues for environmental and obstacle negotiation, turns, locating objects. Occasional LOB requiring min assist to recover.   Stairs Stairs: Yes Stairs assistance: Min assist Stair Management: One rail Right;With cane;Step to pattern;Forwards Number of Stairs: 5 General stair comments: VC's for sequencing and generally safety required. Pt with 1 LOB when he kicked the [facility] when advancing.   Wheelchair Mobility    Modified Rankin (Stroke Patients Only) Modified Rankin (Stroke Patients Only) Pre-Morbid Rankin Score: No significant disability Modified Rankin: Moderately severe disability     Balance Overall balance assessment: Needs assistance Sitting-balance support: Feet supported Sitting balance-Leahy Scale: Good     Standing balance support: No upper extremity supported;During functional activity Standing balance-Leahy Scale: Fair Standing balance comment: handheld support                            Cognition Arousal/Alertness: Awake/alert Behavior During Therapy: Flat affect Overall Cognitive Status: Impaired/Different from baseline Area of Impairment: Attention;Memory;Following commands;Safety/judgement;Awareness;Problem solving                   Current Attention Level: Selective Memory: Decreased short-term memory Following Commands: Follows one step commands with increased time;Follows one step commands consistently;Follows multi-step commands inconsistently Safety/Judgement: Decreased awareness of  deficits;Decreased awareness of safety  Awareness: Emergent Problem Solving: Slow processing;Difficulty sequencing;Requires tactile cues General Comments: Continues with L sided visual deficits and inattention to tasks. Has been using LUE functionally but fine motor is limiting his ability to manage hearing aid and donning mask      Exercises      General Comments        Pertinent Vitals/Pain Pain Assessment: Faces Faces Pain Scale: Hurts a little bit Pain Location: neck Pain Descriptors / Indicators: Discomfort;Sore Pain Intervention(s): Limited activity within patient's tolerance;Monitored during session;Repositioned    Home Living                      Prior Function            PT Goals (current goals can now be found in the care plan section) Acute Rehab PT Goals Patient Stated Goal: to go to rehab PT Goal Formulation: With patient/family Time For Goal Achievement: 04/14/21 Potential to Achieve Goals: Good Progress towards PT goals: Progressing toward goals    Frequency    Min 4X/week      PT Plan Current plan remains appropriate    Co-evaluation              AM-PAC PT "6 Clicks" Mobility   Outcome Measure  Help needed turning from your back to your side while in a flat bed without using bedrails?: None Help needed moving from lying on your back to sitting on the side of a flat bed without using bedrails?: A Little Help needed moving to and from a bed to a chair (including a wheelchair)?: A Little Help needed standing up from a chair using your arms (e.g., wheelchair or bedside chair)?: A Little Help needed to walk in hospital room?: A Little Help needed climbing 3-5 steps with a railing? : A Little 6 Click Score: 19    End of Session Equipment Utilized During Treatment: Gait belt Activity Tolerance: Patient tolerated treatment well Patient left: in chair;with call bell/phone within reach;with chair alarm set Nurse Communication: Mobility  status PT Visit Diagnosis: Unsteadiness on feet (R26.81);Difficulty in walking, not elsewhere classified (R26.2)     Time: [id number] PT Time Calculation (min) (ACUTE ONLY): 26 min  Charges:  $Gait Training: 23-37 mins                     Rolinda Roan, PT, DPT Acute Rehabilitation Services Pager: [phone] Office: [phone]    Thelma Comp 04/03/2021, 10:33 AM

## 2021-04-03 NOTE — Progress Notes (Signed)
Inpatient Rehab Admissions Coordinator:   Note that pt. Moved off of ICU, appearing more stable. I will open a case with pt.'s insurer for potential CIR admit. Pt updated.   Clemens Catholic, [place], [place] Admissions Coordinator  [phone] ([place]) [phone] (office)

## 2021-04-03 NOTE — Progress Notes (Signed)
Converted to afib, initially rates in 130s, but now down to 90s. Will start metoprolol for rate control, no AC due to recent hemorrhage.   Roland Rack, MD [facility] [phone]  If 7pm- 7am, please page neurology on call as listed in [place].

## 2021-04-04 ENCOUNTER — Inpatient Hospital Stay (HOSPITAL_COMMUNITY)
Admission: RE | Admit: 2021-04-04 | Discharge: 2021-04-20 | DRG: 057 | Disposition: A | Discharge status: Home Health | Payer: No Typology Code available for payment source | Source: Intra-hospital | Attending: Physical Medicine & Rehabilitation | Admitting: Physical Medicine & Rehabilitation

## 2021-04-04 ENCOUNTER — Encounter (HOSPITAL_COMMUNITY): Payer: Self-pay | Admitting: Neurology

## 2021-04-04 DIAGNOSIS — I351 Nonrheumatic aortic (valve) insufficiency: Secondary | ICD-10-CM | POA: Diagnosis present

## 2021-04-04 DIAGNOSIS — H539 Unspecified visual disturbance: Secondary | ICD-10-CM | POA: Diagnosis present

## 2021-04-04 DIAGNOSIS — E785 Hyperlipidemia, unspecified: Secondary | ICD-10-CM | POA: Diagnosis present

## 2021-04-04 DIAGNOSIS — G441 Vascular headache, not elsewhere classified: Secondary | ICD-10-CM | POA: Diagnosis present

## 2021-04-04 DIAGNOSIS — I480 Paroxysmal atrial fibrillation: Secondary | ICD-10-CM

## 2021-04-04 DIAGNOSIS — I69398 Other sequelae of cerebral infarction: Secondary | ICD-10-CM | POA: Diagnosis not present

## 2021-04-04 DIAGNOSIS — I69354 Hemiplegia and hemiparesis following cerebral infarction affecting left non-dominant side: Principal | ICD-10-CM

## 2021-04-04 DIAGNOSIS — E871 Hypo-osmolality and hyponatremia: Secondary | ICD-10-CM | POA: Diagnosis present

## 2021-04-04 DIAGNOSIS — I6310 Cerebral infarction due to embolism of unspecified precerebral artery: Secondary | ICD-10-CM | POA: Diagnosis not present

## 2021-04-04 DIAGNOSIS — I69322 Dysarthria following cerebral infarction: Secondary | ICD-10-CM | POA: Diagnosis not present

## 2021-04-04 DIAGNOSIS — I63431 Cerebral infarction due to embolism of right posterior cerebral artery: Secondary | ICD-10-CM | POA: Diagnosis not present

## 2021-04-04 DIAGNOSIS — F068 Other specified mental disorders due to known physiological condition: Secondary | ICD-10-CM | POA: Diagnosis present

## 2021-04-04 DIAGNOSIS — R1310 Dysphagia, unspecified: Secondary | ICD-10-CM | POA: Diagnosis present

## 2021-04-04 DIAGNOSIS — I951 Orthostatic hypotension: Secondary | ICD-10-CM | POA: Diagnosis not present

## 2021-04-04 DIAGNOSIS — R7303 Prediabetes: Secondary | ICD-10-CM | POA: Diagnosis present

## 2021-04-04 DIAGNOSIS — I4891 Unspecified atrial fibrillation: Secondary | ICD-10-CM | POA: Diagnosis present

## 2021-04-04 DIAGNOSIS — R5383 Other fatigue: Secondary | ICD-10-CM | POA: Diagnosis present

## 2021-04-04 DIAGNOSIS — I10 Essential (primary) hypertension: Secondary | ICD-10-CM | POA: Diagnosis present

## 2021-04-04 DIAGNOSIS — I639 Cerebral infarction, unspecified: Secondary | ICD-10-CM | POA: Diagnosis present

## 2021-04-04 DIAGNOSIS — I69390 Apraxia following cerebral infarction: Secondary | ICD-10-CM

## 2021-04-04 DIAGNOSIS — I959 Hypotension, unspecified: Secondary | ICD-10-CM | POA: Diagnosis not present

## 2021-04-04 DIAGNOSIS — R5382 Chronic fatigue, unspecified: Secondary | ICD-10-CM | POA: Diagnosis present

## 2021-04-04 DIAGNOSIS — Z823 Family history of stroke: Secondary | ICD-10-CM | POA: Diagnosis not present

## 2021-04-04 DIAGNOSIS — Z87891 Personal history of nicotine dependence: Secondary | ICD-10-CM | POA: Diagnosis not present

## 2021-04-04 DIAGNOSIS — R509 Fever, unspecified: Secondary | ICD-10-CM

## 2021-04-04 DIAGNOSIS — I69391 Dysphagia following cerebral infarction: Secondary | ICD-10-CM

## 2021-04-04 DIAGNOSIS — I63511 Cerebral infarction due to unspecified occlusion or stenosis of right middle cerebral artery: Secondary | ICD-10-CM | POA: Diagnosis not present

## 2021-04-04 DIAGNOSIS — U099 Post covid-19 condition, unspecified: Secondary | ICD-10-CM | POA: Diagnosis present

## 2021-04-04 DIAGNOSIS — I69319 Unspecified symptoms and signs involving cognitive functions following cerebral infarction: Secondary | ICD-10-CM

## 2021-04-04 LAB — TSH: TSH: 2.508 u[IU]/mL (ref 0.350–4.500)

## 2021-04-04 MED ORDER — POLYETHYLENE GLYCOL 3350 17 G PO PACK
17.0000 g | PACK | Freq: Every day | ORAL | Status: DC | PRN
Start: 2021-04-04 — End: 2021-04-20
  Administered 2021-04-12: 17 g via ORAL
  Filled 2021-04-04 (×2): qty 1

## 2021-04-04 MED ORDER — PROCHLORPERAZINE MALEATE 5 MG PO TABS: 5.0000 mg | ORAL_TABLET | Freq: Four times a day (QID) | ORAL | Status: DC | PRN

## 2021-04-04 MED ORDER — FLEET ENEMA 7-19 GM/118ML RE ENEM
1.0000 | ENEMA | Freq: Once | RECTAL | Status: DC | PRN
Start: 2021-04-04 — End: 2021-04-20

## 2021-04-04 MED ORDER — GUAIFENESIN-DM 100-10 MG/5ML PO SYRP: 5.0000 mL | ORAL_SOLUTION | Freq: Four times a day (QID) | ORAL | Status: DC | PRN

## 2021-04-04 MED ORDER — ATORVASTATIN CALCIUM 80 MG PO TABS: 80.0000 mg | ORAL_TABLET | Freq: Every day | ORAL | Status: DC

## 2021-04-04 MED ORDER — PANTOPRAZOLE SODIUM 40 MG PO TBEC
40.0000 mg | DELAYED_RELEASE_TABLET | Freq: Every day | ORAL | Status: DC
  Administered 2021-04-05 – 2021-04-20 (×16): 40 mg via ORAL
  Filled 2021-04-04 (×17): qty 1

## 2021-04-04 MED ORDER — LISINOPRIL 10 MG PO TABS
10.0000 mg | ORAL_TABLET | Freq: Every day | ORAL | Status: DC
  Administered 2021-04-05 – 2021-04-06 (×2): 10 mg via ORAL
  Filled 2021-04-04 (×2): qty 1

## 2021-04-04 MED ORDER — PANTOPRAZOLE SODIUM 40 MG PO TBEC: 40.0000 mg | DELAYED_RELEASE_TABLET | Freq: Every day | ORAL | Status: DC

## 2021-04-04 MED ORDER — LABETALOL HCL 5 MG/ML IV SOLN
10.0000 mg | Freq: Once | INTRAVENOUS | Status: AC
  Administered 2021-04-04: 10 mg via INTRAVENOUS
  Filled 2021-04-04: qty 4

## 2021-04-04 MED ORDER — PROCHLORPERAZINE EDISYLATE 10 MG/2ML IJ SOLN: 5.0000 mg | Freq: Four times a day (QID) | INTRAMUSCULAR | Status: DC | PRN

## 2021-04-04 MED ORDER — ALUM & MAG HYDROXIDE-SIMETH 200-200-20 MG/5ML PO SUSP: 30.0000 mL | ORAL | Status: DC | PRN

## 2021-04-04 MED ORDER — ACETAMINOPHEN 325 MG PO TABS
325.0000 mg | ORAL_TABLET | ORAL | Status: DC | PRN
  Administered 2021-04-05 – 2021-04-17 (×13): 650 mg via ORAL
  Filled 2021-04-04 (×14): qty 2

## 2021-04-04 MED ORDER — AMANTADINE HCL 100 MG PO CAPS
100.0000 mg | ORAL_CAPSULE | Freq: Two times a day (BID) | ORAL | Status: DC
  Administered 2021-04-05 – 2021-04-12 (×15): 100 mg via ORAL
  Filled 2021-04-04 (×15): qty 1

## 2021-04-04 MED ORDER — METOPROLOL TARTRATE 12.5 MG HALF TABLET
12.5000 mg | ORAL_TABLET | Freq: Two times a day (BID) | ORAL | Status: DC
  Administered 2021-04-04 – 2021-04-15 (×18): 12.5 mg via ORAL
  Filled 2021-04-04 (×22): qty 1

## 2021-04-04 MED ORDER — ASPIRIN 81 MG PO TBEC: 81.0000 mg | DELAYED_RELEASE_TABLET | Freq: Every day | ORAL | 11 refills | Status: DC

## 2021-04-04 MED ORDER — BLOOD PRESSURE CONTROL BOOK
Freq: Once | Status: DC
  Filled 2021-04-04: qty 1

## 2021-04-04 MED ORDER — TRAZODONE HCL 50 MG PO TABS
25.0000 mg | ORAL_TABLET | Freq: Every evening | ORAL | Status: DC | PRN
  Administered 2021-04-14 – 2021-04-18 (×2): 50 mg via ORAL
  Filled 2021-04-04 (×2): qty 1

## 2021-04-04 MED ORDER — ATORVASTATIN CALCIUM 80 MG PO TABS
80.0000 mg | ORAL_TABLET | Freq: Every day | ORAL | Status: DC
  Administered 2021-04-05 – 2021-04-19 (×16): 80 mg via ORAL
  Filled 2021-04-04 (×15): qty 1

## 2021-04-04 MED ORDER — DIPHENHYDRAMINE HCL 12.5 MG/5ML PO ELIX: 12.5000 mg | ORAL_SOLUTION | Freq: Four times a day (QID) | ORAL | Status: DC | PRN

## 2021-04-04 MED ORDER — BISACODYL 10 MG RE SUPP: 10.0000 mg | Freq: Every day | RECTAL | Status: DC | PRN

## 2021-04-04 MED ORDER — BIOTENE DRY MOUTH MT LIQD: 15.0000 mL | OROMUCOSAL | Status: DC | PRN

## 2021-04-04 MED ORDER — TOPIRAMATE 25 MG PO TABS
25.0000 mg | ORAL_TABLET | Freq: Every day | ORAL | Status: DC
  Administered 2021-04-04 – 2021-04-18 (×15): 25 mg via ORAL
  Filled 2021-04-04 (×16): qty 1

## 2021-04-04 MED ORDER — MELATONIN 5 MG PO TABS: 5.0000 mg | ORAL_TABLET | Freq: Every evening | ORAL | Status: DC | PRN

## 2021-04-04 MED ORDER — ASPIRIN EC 81 MG PO TBEC
81.0000 mg | DELAYED_RELEASE_TABLET | Freq: Every day | ORAL | Status: DC
  Administered 2021-04-05 – 2021-04-20 (×16): 81 mg via ORAL
  Filled 2021-04-04 (×16): qty 1

## 2021-04-04 MED ORDER — METOPROLOL TARTRATE 25 MG PO TABS: 12.5000 mg | ORAL_TABLET | Freq: Two times a day (BID) | ORAL | Status: DC

## 2021-04-04 MED ORDER — PROCHLORPERAZINE 25 MG RE SUPP: 12.5000 mg | Freq: Four times a day (QID) | RECTAL | Status: DC | PRN

## 2021-04-04 MED ORDER — METHOCARBAMOL 500 MG PO TABS
500.0000 mg | ORAL_TABLET | Freq: Four times a day (QID) | ORAL | Status: DC | PRN
  Administered 2021-04-08: 500 mg via ORAL
  Filled 2021-04-04: qty 1

## 2021-04-04 NOTE — Progress Notes (Signed)
Izora Ribas, MD   Physician  Physical Medicine and Rehabilitation  PMR Pre-admission     Signed  Date of Service:  04/01/2021 12:05 PM       Related encounter: Admission (Current) from 03/30/2021 in [facility]       Signed          Show:Clear all [x] Written[x] Templated[x] Copied  Added by: [x] Cristina Gong, RN[x] Raulkar, Clide Deutscher, MD[x] Genella Mech, CCC-SLP   [] Hover for details                                                                                                                                                                                                                                                                                                                                                                                                                                                                 PMR Admission Coordinator Pre-Admission Assessment   Patient: Geoffrey Wells is an 75 y.o., male MRN: [id number] DOB: March 30, 1946 Height:   Weight:     Insurance Information HMO:     PPO:      PCP:      IPA:      80/20:      OTHER: PRIMARY: [place]    Policy#: [id number]  Subscriber: pt CM Name: Theodoro Clock      Phone#: [phone] ext [phone]     Fax#: [phone] or [email or web address]       Pre-Cert#: [id number]   approved for 30 days Benefits:  Phone #: [phone] option #7     Name: 8/2 Eff. Date: active     Deduct: none      Out of Pocket Max: none      Life Max: none CIR: per [place]      SNF: per [place] Outpatient: per [place]     Co-Pay: Home Health: per [place]      Co-Pay: D[place]: per [facility]     Co-Pay: Providers: [facility] network  SECONDARY:     Development worker, community:       Phone#:   [employer] Engineer, [employer]" for patients in Inpatient Rehabilitation Facilities with  attached "Privacy Act [place] Records" was provided and verbally reviewed with: Family   Emergency Contact Information Contact Information       Name Relation Home Work [place], [name] Son     [phone]    [place], [facility]     [phone]           Current Medical History  Patient Admitting Diagnosis: CVA   History of Present Illness: Geoffrey Wells is an 75 y.o. male veteran with hypertension, hyperlipidemia, prior TIA (on aspirin), ascending aortic aneurysm, carotid artery stenosis, history of smoking, and history of bilateral PE (2021). He presented to [facility] ED on 03/30/2021 for left hand weakness and a code stroke was subsequently called.   On further history he reported he had a chiropractic neck manipulation the day before and therefore CTA was obtained to assess for dissection.  No dissection was revealed but he was noted to have a very diminutive right vertebral artery with intracranial loss of opacification and no opacification of a right PICA.  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  En route to CTA he was having worsening weakness and dysarthria for which dry head CT was also repeated just prior to CTA, which appeared stable; blood pressure goal was updated to 140-180 to allow for brainstem perfusion in the setting of the significant stenosis.  However he continued to worsen, briefly had a blood pressure in the 190s which improved with a dose of labetalol, and began to complain of a severe headache, with examination notable for brainstem findings of new nystagmus most notable in up gaze (pendular side to side), loss of uvula elevation on the right, and pseudobulbar affect.     Head CT was again repeated which showed trace subarachnoid hemorrhage on the right near the sylvian fissure.  Blood pressure goal was updated to less than 140, but given high risk of his brainstem stroke progression TPA reversal was not immediately pursued.  On repeat  examination at about 11:30 AM he was noted to additionally have left-sided visual field cut as well as some left-sided neglect. Head CT was essentially stable with minimal progression of the [facility], and therefore CTA was repeated to evaluate for potential R MCA occlusion. 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 Case discussed extensively with Dr. Tamera Punt (neuro IR) and Dr. Lavera Guise (Stroke neurology). Given difficulty of approach to R PCA, likely to require stenting of the L vert occlusion followed by traversal of a second stenotic area, as well as SAH which would make DAPT required to prevent in-stent thrombosis very risky with regards  to worsening bleeding, decided to defer intervention.   BP goal was increased to 150-170 SBP with improvement of brainstem findings, then reduced to 140-160, with stable R PCA related findings (hemianopia, inability to look to the left), but also improved left sided weakness. Headache was slightly worse but generally stable (at worst patient reported 6/10, which improved to 4/10 and most recently 4.5/10; wife notes he has a very high pain tolerance).  Stability head CT revealed temporal lobe intraparenchymal hemorrhage, after which decision was made to reverse with TXA.  At that time the patient's examination remained stable, NIH of 6 for mild drowsiness, right gaze preference, left hemianopia, left facial droop that is mild, limb ataxia in the left upper extremity, mild dysarthria (one-point for each finding except 2 points for the hemianopia)   On initial history he reported some chronic intermittent left hand tingling managed by chiropractic manipulation, chronic neck pain managed by chiropractic manipulation (high-speed manipulation, last performed 7/28), an episode of chest pain and left hand weakness the week prior to presentation which fully resolved, in the setting of increasing in his exercise from 2 miles daily walking to 3 miles daily walking.  He  reports prior heavier alcohol use and reports having cut back to no more than 7 drinks per week, and no more than 2 drinks per day.  Denies any other substance use.  Reports chronic shortness of breath that waxes and wanes since his COVID diagnosis in May 2021.   Complete NIHSS TOTAL: 4   Patient's medical record from [facility] has been reviewed by the rehabilitation admission coordinator and physician.   Past Medical History      Past Medical History:  Diagnosis Date   Dysphagia      due to radiation/weakness   Pneumonia due to COVID-19 virus 01/10/2020    hospitalization X 2   Throat cancer [facility])      Radiotactic surgery      Family History   family history includes Stroke in his father and mother.   Prior Rehab/Hospitalizations Has the patient had prior rehab or hospitalizations prior to admission? Yes   Has the patient had major surgery during 100 days prior to admission? No               Current Medications   Current Facility-Administered Medications:   0.9 %  sodium chloride infusion, , Intravenous, Once, Christian, Rylee, MD   acetaminophen (TYLENOL) tablet 650 mg, 650 mg, Oral, Q4H PRN, 650 mg at 04/04/21 0122 **OR** acetaminophen (TYLENOL) 160 MG/5ML solution 650 mg, 650 mg, Per Tube, Q4H PRN **OR** acetaminophen (TYLENOL) suppository 650 mg, 650 mg, Rectal, Q4H PRN, Kerney Elbe, MD   aspirin EC tablet 81 mg, 81 mg, Oral, Daily, Sethi, Pramod S, MD, 81 mg at 04/04/21 0901   atorvastatin (LIPITOR) tablet 80 mg, 80 mg, Oral, Daily, Metzger-Cihelka, Desiree, NP, 80 mg at 04/03/21 1819   Chlorhexidine Gluconate Cloth 2 % PADS 6 each, 6 each, Topical, Q0600, Kerney Elbe, MD, 6 each at 04/03/21 0657   labetalol (NORMODYNE) injection 20 mg, 20 mg, Intravenous, Q2H PRN, Metzger-Cihelka, Desiree, NP, 20 mg at 04/04/21 0122   lisinopril ([name]) tablet 10 mg, 10 mg, Oral, Daily, Kerney Elbe, MD, 10 mg at 04/04/21 0901   melatonin tablet 5 mg, 5 mg, Oral,  QHS PRN, Kerney Elbe, MD, 5 mg at 04/03/21 2210   metoprolol tartrate (LOPRESSOR) tablet 12.5 mg, 12.5 mg, Oral, BID, Greta Doom, MD, 12.5 mg at 04/04/21  0900   pantoprazole (PROTONIX) EC tablet 40 mg, 40 mg, Oral, Daily, Pham, Minh Q, RPH-CPP, 40 mg at 04/04/21 0901   senna-docusate (Senokot-S) tablet 1 tablet, 1 tablet, Oral, QHS PRN, Kerney Elbe, MD   Patients Current Diet:  Diet Order                  DIET DYS 3 Room service appropriate? Yes with Assist; Fluid consistency: Nectar Thick  Diet effective now                       Precautions / Restrictions Precautions Precautions: Fall, Other (comment) Precaution Comments: L hemianopia, BP < 160 Restrictions Weight Bearing Restrictions: No    Has the patient had 2 or more falls or a fall with injury in the past year? No   Prior Activity Level Community (5-7x/wk): Pt. active in the community PTA   Prior Functional Level Self Care: Did the patient need help bathing, dressing, using the toilet or eating? Independent   Indoor Mobility: Did the patient need assistance with walking from room to room (with or without device)? Independent   Stairs: Did the patient need assistance with internal or external stairs (with or without device)? Independent   Functional Cognition: Did the patient need help planning regular tasks such as shopping or remembering to take medications? Independent   Home Assistive Devices / Equipment Home Equipment: Grab bars - tub/shower, Cane - single point, Environmental consultant - 2 wheels   Prior Device Use: Indicate devices/aids used by the patient prior to current illness, exacerbation or injury? None of the above   Current Functional Level Cognition   Arousal/Alertness: Awake/alert Overall Cognitive Status: Impaired/Different from baseline Current Attention Level: Selective Orientation Level: Oriented X4 Following Commands: Follows one step commands with increased time, Follows one step commands  consistently, Follows multi-step commands inconsistently Safety/Judgement: Decreased awareness of deficits, Decreased awareness of safety General Comments: Continues with L sided visual deficits and inattention to tasks. Has been using LUE functionally but fine motor is limiting his ability to manage hearing aid and donning mask Attention: Sustained Sustained Attention: Appears intact Memory: Impaired Memory Impairment: Storage deficit, Retrieval deficit, Decreased recall of new information Awareness: Impaired Awareness Impairment: Anticipatory impairment Problem Solving: Impaired Problem Solving Impairment: Verbal complex, Functional complex Behaviors: Impulsive Safety/Judgment: Impaired    Extremity Assessment (includes Sensation/Coordination)   Upper Extremity Assessment: RUE deficits/detail, LUE deficits/detail RUE Deficits / Details: strength 4/5 RUE Sensation: WNL LUE Deficits / Details: strength 4/5; poor coordination; poor figure ground and apraxia; sensation intact LUE Sensation: decreased proprioception LUE Coordination: decreased fine motor, decreased gross motor  Lower Extremity Assessment: Overall WFL for tasks assessed     ADLs   Overall ADL's : Needs assistance/impaired Eating/Feeding: Minimal assistance, Sitting Eating/Feeding Details (indicate cue type and reason): cues for food on L side of plate Grooming: Minimal assistance, Sitting Upper Body Bathing: Moderate assistance, Sitting Upper Body Bathing Details (indicate cue type and reason): IV in arm, wrapped for protection Lower Body Bathing: Moderate assistance, Sitting/lateral leans, Sit to/from stand, Cueing for safety, Cueing for sequencing Upper Body Dressing : Minimal assistance, Sitting Lower Body Dressing: Moderate assistance, Sitting/lateral leans, Sit to/from stand Lower Body Dressing Details (indicate cue type and reason): increased time; decreased ability in LUE to manipulate sock in hand Toilet  Transfer: Minimal assistance, +2 for physical assistance, +2 for safety/equipment, Stand-pivot, BSC Toilet Transfer Details (indicate cue type and reason): simulated to recliner Toileting- Clothing Manipulation and Hygiene: Moderate  assistance, +2 for physical assistance, +2 for safety/equipment, Sitting/lateral lean, Sit to/from stand, Cueing for sequencing, Cueing for safety Functional mobility during ADLs: Minimal assistance, +2 for physical assistance, +2 for safety/equipment, Cueing for safety, Cueing for sequencing General ADL Comments: Pt limited by L visual deficits appears to be in bilateral eyes not able to scan past midline to L side (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 Pt requires assist for ADL tasks as L incoordation and poor awareness of deficits on L side.     Mobility   Overal bed mobility: Needs Assistance Bed Mobility: Supine to Sit Rolling: Min guard Supine to sit: Supervision General bed mobility comments: Pt spontaneously initiated transition to EOB. Was able to complete without assistance however decreased safety awareness noted.     Transfers   Overall transfer level: Needs assistance Equipment used: None Transfers: Sit to/from Stand Sit to Stand: Min guard, Min assist Stand pivot transfers: Min assist, +2 physical assistance, +2 safety/equipment General transfer comment: Close guard for safety but overall pt was able to power-up to full stand without assistance. Pt demonstrating decreased safety awareness, dropping things on the floor and attempting to bend down to pick them up in the middle of transferring to/from stand. Min assist provided during these times with cues for optimal safety.     Ambulation / Gait / Stairs / Wheelchair Mobility   Ambulation/Gait Ambulation/Gait assistance: Herbalist (Feet): 200 Feet Assistive device: None Gait Pattern/deviations: Step-through pattern, Decreased stride length, Drifts right/left General Gait Details: Decreased L  reciprocal arm swing, drifting towards and staying close to right side of wall, mod-max cues for environmental and obstacle negotiation, turns, locating objects. Occasional LOB requiring min assist to recover. Gait velocity: decreased Gait velocity interpretation: 1.31 - 2.62 ft/sec, indicative of limited community ambulator Stairs: Yes Stairs assistance: Min assist Stair Management: One rail Right, With cane, Step to pattern, Forwards Number of Stairs: 5 General stair comments: VC's for sequencing and generally safety required. Pt with 1 LOB when he kicked the [facility] when advancing.     Posture / Balance Balance Overall balance assessment: Needs assistance Sitting-balance support: Feet supported Sitting balance-Leahy Scale: Good Standing balance support: No upper extremity supported, During functional activity Standing balance-Leahy Scale: Fair Standing balance comment: handheld support   Special needs/care consideration      Previous Home Environment  Living Arrangements: Spouse/significant other  Lives With: Spouse Available Help at Discharge: Family, Available PRN/intermittently Type of Home: House Home Layout: One level Home Access: Stairs to enter Entrance Stairs-Rails: Right Entrance Stairs-Number of Steps: 5 Bathroom Shower/Tub: Chiropodist: Standard Additional Comments: Pt's spouse provided PLOF info as pt was very distracted.   Discharge Living Setting Plans for Discharge Living Setting: Patient's home Type of Home at Discharge: House Discharge Home Layout: One level Discharge Home Access: Stairs to enter Entrance Stairs-Rails: Right Entrance Stairs-Number of Steps: 5 Discharge Bathroom Shower/Tub: Tub/shower unit Discharge Bathroom Toilet: Standard Discharge Bathroom Accessibility: Yes   Social/Family/Support Systems Patient Roles: Spouse Contact Information: [phone] Anticipated Caregiver: Beecher Mcardle Anticipated Caregiver's Contact  Information: [phone] Ability/Limitations of Caregiver: Can provide up to min A Caregiver Availability: 24/7 Discharge Plan Discussed with Primary Caregiver: Yes Is Caregiver In Agreement with Plan?: Yes Does Caregiver/Family have Issues with Lodging/Transportation while Pt is in Rehab?: Yes   Goals Patient/Family Goal for Rehab: PT/OT/SLP supervision Expected length of stay: 7-10 days Pt/Family Agrees to Admission and willing to participate: Yes Program Orientation Provided & Reviewed with Pt/Caregiver Including Roles  &  Responsibilities: Yes   Decrease burden of Care through IP rehab admission: Specialzed equipment needs, Decrease number of caregivers, Bowel and bladder program, and Patient/family education   Possible need for SNF placement upon discharge: not anticipated           Patient Condition: I have reviewed medical records from [facility], spoken with CM, and patient and spouse. I met with patient at the bedside and discussed via phone for inpatient rehabilitation assessment.  Patient will benefit from ongoing PT, OT, and SLP, can actively participate in 3 hours of therapy a day 5 days of the week, and can make measurable gains during the admission.  Patient will also benefit from the coordinated team approach during an Inpatient Acute Rehabilitation admission.  The patient will receive intensive therapy as well as Rehabilitation physician, nursing, social worker, and care management interventions.  Due to safety, skin/wound care, disease management, medication administration, pain management, and patient education the patient requires 24 hour a day rehabilitation nursing.  The patient is currently min assist with mobility and basic ADLs.  Discharge setting and therapy post discharge at home with home health is anticipated.  Patient has agreed to participate in the Acute Inpatient Rehabilitation Program and will admit today.   Preadmission Screen Completed By:  Clemens Catholic with updates by Cleatrice Burke, 04/04/2021 1:15 PM ______________________________________________________________________   Discussed status with Dr. Ranell Patrick on 04/04/2021 at 1313 and received approval for admission today.   Admission Coordinator: Clemens Catholic with updates by Cleatrice Burke, RN, time [phone] Date 04/04/2021    Assessment/Plan: Diagnosis: R PCA CVA Does the need for close, 24 hr/day Medical supervision in concert with the patient's rehab needs make it unreasonable for this patient to be served in a less intensive setting? Yes Co-Morbidities requiring supervision/potential complications: generalized weakness, h/o bilateral pulmonary embolism, h/ of hypoxemic respiratory failure 2/2 COVID-19, hyperglycemia, hypocalcemia, anemia Due to bladder management, bowel management, safety, skin/wound care, disease management, medication administration, pain management, and patient education, does the patient require 24 hr/day rehab nursing? Yes Does the patient require coordinated care of a physician, rehab nurse, PT, OT, and SLP to address physical and functional deficits in the context of the above medical diagnosis(es)? Yes Addressing deficits in the following areas: balance, endurance, locomotion, strength, transferring, bowel/bladder control, bathing, dressing, feeding, grooming, toileting, cognition, and psychosocial support Can the patient actively participate in an intensive therapy program of at least 3 hrs of therapy 5 days a week? Yes The potential for patient to make measurable gains while on inpatient rehab is excellent Anticipated functional outcomes upon discharge from inpatient rehab: supervision PT, supervision OT, supervision SLP Estimated rehab length of stay to reach the above functional goals is: 5-7 days Anticipated discharge destination: Home 10. Overall Rehab/Functional Prognosis: excellent     MD Signature: Leeroy Cha, MD           Revision History                             Note Details  Author Izora Ribas, MD File Time 04/04/2021  1:23 PM  Author Type Physician Status Signed  Last Editor Izora Ribas, MD Service Physical Medicine and Rehabilitation

## 2021-04-04 NOTE — Progress Notes (Signed)
Inpatient Rehabilitation Medication Review by a Pharmacist  A complete drug regimen review was completed for this patient to identify any potential clinically significant medication issues.  Clinically significant medication issues were identified:  No  Check AMION for pharmacist assigned to patient if future medication questions/issues arise during this admission.  Pharmacist comments:   Time spent performing this drug regimen review (minutes):  10   Efraim Kaufmann, PharmD, BCPS 04/04/2021 5:59 PM

## 2021-04-04 NOTE — Progress Notes (Signed)
Inpatient Rehabilitation Admissions Coordinator   I have insurance approval and Cir bed for patient  . I met with patient and his wife at bedside and they are in agreement . I contacted Dr Leonie Man, acute team and TOC. I will make the arrangements to admit today.  Danne Baxter, RN, MSN Rehab Admissions Coordinator [phone] 04/04/2021 12:17 PM

## 2021-04-04 NOTE — TOC Transition Note (Signed)
Transition of Care [facility] - CM/SW Discharge Note   Patient Details  Name: Geoffrey Wells MRN: [id number] Date of Birth: 02-07-1946  Transition of Care [facility]) CM/SW Contact:  Pollie Friar, RN Phone Number: 04/04/2021, 12:24 PM   Clinical Narrative:    Pt discharging to CIR today. CM signing off.   Final next level of care: IP Rehab Facility Barriers to Discharge: No Barriers Identified   Patient Goals and CMS Choice        Discharge Placement                       Discharge Plan and Services                                     Social Determinants of Health (SDOH) Interventions     Readmission Risk Interventions No flowsheet data found.

## 2021-04-04 NOTE — Progress Notes (Signed)
Alert and oriented x 4. Admitted to floor today with family on arrival. Patient denies any pain at this time. Lungs clear bilaterally on auscultation. Moves all extremities and weak to left side. Able to ambulate with front wheel walker. Skin dry and intact with ecchymotic area to right arm.  Patient is continent x 2.

## 2021-04-04 NOTE — Progress Notes (Signed)
Patient ID: Geoffrey Wells, male   DOB: 10/23/1945, 75 y.o.   MRN: [id number] Met with the patient, wife and son to introduce self and the role of the nurse CM. Reviewed rehab routine, therapy schedule and plan of care. Discussed secondary stroke risks including HTN, HLD (LDL 184) and prediabetes (A1C 5.8). Patient is very sleepy and warm to touch but reports he feels cold. Continue to follow long to discharge to address educational needs and collaborate with the SW to facilitate preparation for discharge. [name], RN

## 2021-04-04 NOTE — Progress Notes (Signed)
Physical Therapy Treatment Patient Details Name: Geoffrey Wells MRN: [id number] DOB: [birth year] Today's Date: 04/04/2021    History of Present Illness Pt is a 75 y/o male who presents s/p L hand weakness and L facial droop. 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 Received reversal medication. PMHx: COVID PNA.    PT Comments    Patient easily fatigued this session requiring constant encouragement to participate. Patient performed bed mobility with supervision x 3 during session. Patient required minA for sit to stand x 2 to Rourke pants in preparation to transfer to CIR. Continue to recommend comprehensive inpatient rehab (CIR) for post-acute therapy needs.     Follow Up Recommendations  CIR;Supervision/Assistance - 24 hour     Equipment Recommendations  None recommended by PT    Recommendations for Other Services       Precautions / Restrictions Precautions Precautions: Fall;Other (comment) Precaution Comments: L hemianopia, BP < 160 Restrictions Weight Bearing Restrictions: No    Mobility  Bed Mobility Overal bed mobility: Needs Assistance Bed Mobility: Rolling;Supine to Sit;Sit to Supine Rolling: Min guard   Supine to sit: Supervision Sit to supine: Supervision   General bed mobility comments: Performed bed mobility x 3 throughout session due to fatigue and requesting to lay down prior to end of session. Required encouragement to sit up    Transfers Overall transfer level: Needs assistance Equipment used: None Transfers: Sit to/from Stand Sit to Stand: Min assist Stand pivot transfers: Min assist       General transfer comment: minA to rise and steady. Sit to stand x 3  Ambulation/Gait             General Gait Details: Declined ambulation   Stairs             Wheelchair Mobility    Modified Rankin (Stroke Patients  Only) Modified Rankin (Stroke Patients Only) Pre-Morbid Rankin Score: No significant disability Modified Rankin: Moderately severe disability     Balance Overall balance assessment: Needs assistance Sitting-balance support: Feet supported Sitting balance-Leahy Scale: Good     Standing balance support: No upper extremity supported;During functional activity Standing balance-Leahy Scale: Fair Standing balance comment: handheld support                            Cognition Arousal/Alertness: Awake/alert Behavior During Therapy: Flat affect Overall Cognitive Status: Impaired/Different from baseline Area of Impairment: Attention;Memory;Following commands;Safety/judgement;Awareness;Problem solving                   Current Attention Level: Selective Memory: Decreased short-term memory Following Commands: Follows one step commands with increased time;Follows one step commands consistently;Follows multi-step commands inconsistently Safety/Judgement: Decreased awareness of deficits;Decreased awareness of safety Awareness: Emergent Problem Solving: Slow processing;Difficulty sequencing;Requires tactile cues General Comments: Fatigues very quickly, requesting to lay down within 30 seconds of sitting EOB      Exercises Other Exercises Other Exercises: Standing reaching to perform lower body dressing    General Comments General comments (skin integrity, edema, etc.): Spouse present and supportive      Pertinent Vitals/Pain Pain Assessment: Faces Faces Pain Scale: Hurts even more Pain Location: head, neck Pain Descriptors / Indicators: Discomfort;Sore Pain Intervention(s): Monitored during session;Repositioned    Home Living  Prior Function            PT Goals (current goals can now be found in the care plan section) Acute Rehab PT Goals Patient Stated Goal: to go to rehab PT Goal Formulation: With patient/family Time For Goal  Achievement: 04/14/21 Potential to Achieve Goals: Good Progress towards PT goals: Progressing toward goals    Frequency    Min 4X/week      PT Plan Current plan remains appropriate    Co-evaluation              AM-PAC PT "6 Clicks" Mobility   Outcome Measure  Help needed turning from your back to your side while in a flat bed without using bedrails?: A Little Help needed moving from lying on your back to sitting on the side of a flat bed without using bedrails?: A Little Help needed moving to and from a bed to a chair (including a wheelchair)?: A Little Help needed standing up from a chair using your arms (e.g., wheelchair or bedside chair)?: A Little Help needed to walk in hospital room?: A Little Help needed climbing 3-5 steps with a railing? : A Little 6 Click Score: 18    End of Session Equipment Utilized During Treatment: Gait belt Activity Tolerance: Patient tolerated treatment well Patient left: in bed;with call bell/phone within reach;with family/visitor present Nurse Communication: Mobility status PT Visit Diagnosis: Unsteadiness on feet (R26.81);Difficulty in walking, not elsewhere classified (R26.2)     Time: 1441-1500 PT Time Calculation (min) (ACUTE ONLY): 19 min  Charges:  $Therapeutic Activity: 8-22 mins                     Geoffrey Wells PT, DPT Acute Rehabilitation Services Pager [phone] Office [phone]    Geoffrey Wells 04/04/2021, 3:08 PM

## 2021-04-04 NOTE — Plan of Care (Signed)
Pt is alert oriented x 4.  Pt has left sided weakness, left facial droop. Pt BP goal in order <140. Pt's BP greater than 140, PRN labetalol given x 3. Leonel Ramsay, MD paged to inform of fluctuating BP and consistent headache. Pt states it still hurts even after tylenol.  Pt up to urinate in the bathroom with rolling walker and +1 assistance x 3. Pt is unsteady.    Problem: Education: Goal: Knowledge of disease or condition will improve Outcome: Progressing Goal: Knowledge of secondary prevention will improve Outcome: Progressing Goal: Knowledge of patient specific risk factors addressed and post discharge goals established will improve Outcome: Progressing Goal: Individualized Educational Video(s) Outcome: Progressing   Problem: Coping: Goal: Will identify appropriate support needs Outcome: Progressing   Problem: Health Behavior/Discharge Planning: Goal: Ability to manage health-related needs will improve Outcome: Progressing   Problem: Self-Care: Goal: Ability to participate in self-care as condition permits will improve Outcome: Progressing Goal: Verbalization of feelings and concerns over difficulty with self-care will improve Outcome: Progressing Goal: Ability to communicate needs accurately will improve Outcome: Progressing   Problem: Nutrition: Goal: Risk of aspiration will decrease Outcome: Progressing   Problem: Intracerebral Hemorrhage Tissue Perfusion: Goal: Complications of Intracerebral Hemorrhage will be minimized Outcome: Progressing   Problem: Ischemic Stroke/TIA Tissue Perfusion: Goal: Complications of ischemic stroke/TIA will be minimized Outcome: Progressing

## 2021-04-04 NOTE — H&P (Signed)
Physical Medicine and Rehabilitation Admission H&P    CC: Stroke with functional deficits   HPI: Geoffrey Wells is a 75 year old LH male with history of throat cancer, ascending thoracic aorta aneurysm, Covid X 2, PE, chronic neck pain s/p recent manipulation who was admitted on 03/30/21 with weakness of left hand. CT head negative and CTA negative for dissection. He received tPA and follow CT head showed new acute IPH in posterior right temporal lobe, newly occluded right P3 PCA branch and small volume acute subarachnoid hemorrhage along right temporal and occipital lobes.  He received cryoprecipitate and TXA due to ongoing bleeding and transferred to [facility] for management. MRI brain done revealing multifocal infarcts involving R-MCA/PCA distributions and 4.1 X 3.2 cm IPH in right temporal lobe with scattered small volume SAH as well as acute infarct mesial left occipital lobe.  BLE dopplers were negative for DVT. 2 D echo showed EF showed moderate aortic regurgitation and EF 60-65%.  Hospital course significant for issues with headache and neck pain as well as new onset A. fib.  He was evaluated by Dr. Gwenlyn Found who recommends initiation of [facility] once cleared by neurology. Patient with  resultant hemianopsia with left sided weakness, HA, impulsivity and poor safety awareness. ST evaluation revealed dysarthria with acute on chronic issues with cognitive impairments, dysphagia with mild to moderate vallecular residue and silent aspiration of thin liquids--on D3, nectar liquids.  CIR recommended due to functional decline. He currently complains of left hand weakness.   Review of Systems  HENT:  Negative for hearing loss.   Eyes:  Positive for blurred vision.  Respiratory:  Negative for cough and shortness of breath.   Cardiovascular:  Negative for chest pain and palpitations.  Musculoskeletal:  Positive for myalgias (pain/hurts all over.  Had chiropractic manipulation for neck pain day PTA.)  and neck pain.  Skin:  Negative for rash.  Neurological:  Positive for sensory change, speech change, weakness and headaches.    Past Medical History:  Diagnosis Date   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    fatigue and "foggy brain"   Pneumonia due to COVID-19 virus 01/10/2020   hospitalization X 2   Pulmonary emboli (HCC)    post Covid   Throat cancer [facility])    Radiotactic surgery    Past Surgical History:  Procedure Laterality Date   HERNIA REPAIR      Family History  Problem Relation Age of Onset   Stroke Mother    Stroke Father     Social History: Married. 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 He reports that he has quit smoking in his 66's. His smoking use included cigarettes. He has a 12.50 pack-year smoking history. He has never used smokeless tobacco. He reports current alcohol use --beer or wine 6 drinks about twice a week. He reports previous drug use.   Allergies: No Known Allergies   Medications Prior to Admission  Medication Sig Dispense Refill   5-Hydroxytryptophan (5-HTP) 200 MG TABS Take 1 tablet by mouth daily.     [START ON 04/05/2021] aspirin EC 81 MG EC tablet Take 1 tablet (81 mg total) by mouth daily. Swallow whole. 30 tablet 11   atorvastatin (LIPITOR) 80 MG tablet Take 1 tablet (80 mg total) by mouth daily.     b complex vitamins capsule Take 1 capsule by mouth daily.     Coenzyme Q10 (CO Q10) 100 MG  CAPS Take 1 capsule by mouth daily.     Magnesium 500 MG TABS Take 1 tablet by mouth daily.     MELATONIN PO Take 6 mg by mouth at bedtime.     metoprolol tartrate (LOPRESSOR) 25 MG tablet Take 0.5 tablets (12.5 mg total) by mouth 2 (two) times daily.     Nutritional Supplements (JUICE PLUS FIBRE PO) Take 3 capsules by mouth in the morning and at bedtime.     [START ON 04/05/2021] pantoprazole (PROTONIX) 40 MG tablet Take 1 tablet (40 mg total) by mouth daily.     Probiotic Product (PROBIOTIC PO) Take 1 capsule by mouth  daily.     QUERCETIN PO Take 1,000 mg by mouth daily.     Vitamin D-Vitamin K (VITAMIN K2-VITAMIN D3 PO) Take 250 mcg by mouth daily.     Zinc 50 MG TABS Take 1 tablet by mouth daily.      Drug Regimen Review  Drug regimen was reviewed and remains appropriate with no significant issues identified    Home: Home Living Family/patient expects to be discharged to:: Private residence Living Arrangements: Spouse/significant other Available Help at Discharge: Family, Available PRN/intermittently Type of Home: House Home Access: Stairs to enter Technical brewer of Steps: 5 Entrance Stairs-Rails: Right Home Layout: One level Bathroom Shower/Tub: Chiropodist: Standard Home Equipment: Grab bars - tub/shower, Cane - single point, Environmental consultant - 2 wheels Additional Comments: Pt's spouse provided PLOF info as pt was very distracted.  Lives With: Spouse   Functional History: Prior Function Level of Independence: Independent Comments: works own a Musician; reports days were variable with activity s/p COVID.   Functional Status:  Mobility: Bed Mobility Overal bed mobility: Needs Assistance Bed Mobility: Supine to Sit, Sit to Supine Rolling: Min guard Supine to sit: Supervision General bed mobility comments: use of rail for supine to sit; sit to supine Transfers Overall transfer level: Needs assistance Equipment used: 1 person hand held assist Transfers: Sit to/from Stand, Stand Pivot Transfers Sit to Stand: Min assist Stand pivot transfers: Min assist General transfer comment: minA with RW, cues for safety Ambulation/Gait Ambulation/Gait assistance: Min assist Gait Distance (Feet): 200 Feet Assistive device: None Gait Pattern/deviations: Step-through pattern, Decreased stride length, Drifts right/left General Gait Details: Decreased L reciprocal arm swing, drifting towards and staying close to right side of wall, mod-max cues for environmental and obstacle  negotiation, turns, locating objects. Occasional LOB requiring min assist to recover. Gait velocity: decreased Gait velocity interpretation: 1.31 - 2.62 ft/sec, indicative of limited community ambulator Stairs: Yes Stairs assistance: Min assist Stair Management: One rail Right, With cane, Step to pattern, Forwards Number of Stairs: 5 General stair comments: VC's for sequencing and generally safety required. Pt with 1 LOB when he kicked the [facility] when advancing.   ADL: ADL Overall ADL's : Needs assistance/impaired Eating/Feeding: Minimal assistance, Sitting Eating/Feeding Details (indicate cue type and reason): cues for food on L side of plate Grooming: Minimal assistance, Sitting Upper Body Bathing: Moderate assistance, Sitting Upper Body Bathing Details (indicate cue type and reason): IV in arm, wrapped for protection Lower Body Bathing: Moderate assistance, Sitting/lateral leans, Sit to/from stand, Cueing for safety, Cueing for sequencing Upper Body Dressing : Minimal assistance, Sitting Lower Body Dressing: Moderate assistance, Sitting/lateral leans, Sit to/from stand Lower Body Dressing Details (indicate cue type and reason): increased time; decreased ability in LUE to manipulate sock in hand Toilet Transfer: Minimal assistance, +2 for physical assistance, +2 for safety/equipment, Stand-pivot, BSC Toilet  Transfer Details (indicate cue type and reason): simulated to recliner Toileting- Clothing Manipulation and Hygiene: Moderate assistance, +2 for physical assistance, +2 for safety/equipment, Sitting/lateral lean, Sit to/from stand, Cueing for sequencing, Cueing for safety Functional mobility during ADLs: Minimal assistance, +2 for physical assistance, +2 for safety/equipment, Cueing for safety, Cueing for sequencing General ADL Comments: Pt limited by L visual deficits- not scanning past midline. Pt requries cues to turn head to L for all functional tasks. Pt requires assist for ADL tasks  as L incoordation and poor awareness of deficits on L side.   Cognition: Cognition Overall Cognitive Status: Impaired/Different from baseline Arousal/Alertness: Awake/alert Orientation Level: Oriented to person, Oriented to place, Oriented to situation Attention: Sustained Sustained Attention: Appears intact Memory: Impaired Memory Impairment: Storage deficit, Retrieval deficit, Decreased recall of new information Awareness: Impaired Awareness Impairment: Anticipatory impairment Problem Solving: Impaired Problem Solving Impairment: Verbal complex, Functional complex Behaviors: Impulsive Safety/Judgment: Impaired Cognition Arousal/Alertness: Awake/alert Behavior During Therapy: Flat affect Overall Cognitive Status: Impaired/Different from baseline Area of Impairment: Attention, Memory, Following commands, Safety/judgement, Awareness, Problem solving Orientation Level: Disoriented to, Time Current Attention Level: Selective Memory: Decreased short-term memory Following Commands: Follows one step commands with increased time, Follows one step commands consistently, Follows multi-step commands inconsistently Safety/Judgement: Decreased awareness of deficits, Decreased awareness of safety Awareness: Emergent Problem Solving: Slow processing, Difficulty sequencing, Requires tactile cues General Comments: Pt fatigues veryquickly and feeling nauseaous today. Pt with difficulty comprehending to turn head to L for visual scanning/trailmaking task on paper.    Blood pressure (!) 152/76, pulse 74, temperature 97.6 F (36.4 C), temperature source Oral, resp. rate 18, height '6\' 1"'$  (1.854 m), weight 94.5 kg. Physical Exam Gen: Kept eyes closed --distracted and restless with heavy breathing at times.  Opens to verbal stimulation and able to communicate HEENT: oral mucosa pink and moist, NCAT Cardio: Rhythm irregular.  Chest: normal effort, normal rate of breathing Abd: soft,  non-distended Ext: no edema Psych: pleasant, normal affect Skin: intact Neurological:     Comments: Left facial weakness with ptosis and field cut. Restless and impatient. Left inattention with mild left sided weakness/apraxia. Mild dysarthria.  Left sided strength 4+/5 with the exception of hand grip 4-/5  Results for orders placed or performed during the hospital encounter of 03/30/21 (from the past 48 hour(s))  TSH     Status: None   Collection Time: 04/04/21  1:53 AM  Result Value Ref Range   TSH 2.508 0.350 - 4.500 uIU/mL    Comment: Performed by a 3rd Generation assay with a functional sensitivity of <=0.01 uIU/mL. Performed at [facility], [place] [address], [place], [place] [address]    VAS [place] LOWER EXTREMITY VENOUS (DVT)  Result Date: 04/03/2021  Lower Venous DVT Study Patient Name:  TIGRAN RICCHIO  Date of Exam:   04/03/2021 Medical Rec #: [id number]  Accession #:    [id number] Date of Birth: [birth year]  Patient Gender: M Patient Age:   75Y Exam Location:  [facility] Procedure:      VAS [place] LOWER EXTREMITY VENOUS (DVT) Referring Phys: [id number] PRAMOD S SETHI --------------------------------------------------------------------------------  Indications: Phlebitis/throbophlebitis.  Comparison Study: no prior Performing Technologist: Archie Patten RVS  Examination Guidelines: A complete evaluation includes B-mode imaging, spectral Doppler, color Doppler, and power Doppler as needed of all accessible portions of each vessel. Bilateral testing is considered an integral part of a complete examination. Limited examinations for reoccurring indications may be performed as noted. The reflux portion of the exam  is performed with the patient in reverse Trendelenburg.  +---------+---------------+---------+-----------+----------+--------------+ RIGHT    CompressibilityPhasicitySpontaneityPropertiesThrombus Aging +---------+---------------+---------+-----------+----------+--------------+ CFV       Full           Yes      Yes                                 +---------+---------------+---------+-----------+----------+--------------+ SFJ      Full                                                        +---------+---------------+---------+-----------+----------+--------------+ FV Prox  Full                                                        +---------+---------------+---------+-----------+----------+--------------+ FV Mid   Full                                                        +---------+---------------+---------+-----------+----------+--------------+ FV DistalFull                                                        +---------+---------------+---------+-----------+----------+--------------+ PFV      Full                                                        +---------+---------------+---------+-----------+----------+--------------+ POP      Full           Yes      Yes                                 +---------+---------------+---------+-----------+----------+--------------+ PTV      Full                                                        +---------+---------------+---------+-----------+----------+--------------+ PERO     Full                                                        +---------+---------------+---------+-----------+----------+--------------+   +---------+---------------+---------+-----------+----------+--------------+ LEFT     CompressibilityPhasicitySpontaneityPropertiesThrombus Aging +---------+---------------+---------+-----------+----------+--------------+ CFV      Full           Yes      Yes                                 +---------+---------------+---------+-----------+----------+--------------+  SFJ      Full                                                        +---------+---------------+---------+-----------+----------+--------------+ FV Prox  Full                                                         +---------+---------------+---------+-----------+----------+--------------+ FV Mid   Full                                                        +---------+---------------+---------+-----------+----------+--------------+ FV DistalFull                                                        +---------+---------------+---------+-----------+----------+--------------+ PFV      Full                                                        +---------+---------------+---------+-----------+----------+--------------+ POP      Full           Yes      Yes                                 +---------+---------------+---------+-----------+----------+--------------+ PTV      Full                                                        +---------+---------------+---------+-----------+----------+--------------+ PERO     Full                                                        +---------+---------------+---------+-----------+----------+--------------+     Summary: BILATERAL: - No evidence of deep vein thrombosis seen in the lower extremities, bilaterally. -   *See table(s) above for measurements and observations. Electronically signed by Deitra Mayo MD on 04/03/2021 at 5:02:33 PM.    Final        Medical Problem List and Plan: 1.  Embolic right MCA/PCA infarct  -patient may shower  -ELOS/Goals: 7-10 days S 2.  Antithrombotics: -DVT/anticoagulation:  Mechanical: Sequential compression devices, below knee Bilateral lower extremities. Dopplers negative at admission--monitor for now.   -antiplatelet therapy: ASA 3. Headaches:  tylenol prn. Start topamax '25mg'$  HS 4. Mood: [name] to follow for evaluation  and support.   -antipsychotic agents: N/A 5. Neuropsych: This patient may be intermittenly capable of making decisions on his own behalf. 6. Skin/Wound Care: Routine pressure relief measures.  7. Fluids/Electrolytes/Nutrition: Monitor I/O. Check lytes in am. 8.  HTN: Monitor BP TID--on metoprolol and Lisinopril  --BP remains labile and will need further titration.  9. New onset A fib: Monitor HR tid--continue metoprolol  --On ASA with recs 10. Headaches/neck pain/Myalgias: Ice for neck pain. Low dose topamax for HA.  --amantadine for activation.   11. Pre-diabetes: Hgb A1c- 5.8. Educate on CM diet. 12. Dyslipidemia:  Now on Lipitor for LDL 184.  13. Decreased initiation: start amantadine.  14. 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   I have personally performed a face to face diagnostic evaluation, including, but not limited to relevant history and physical exam findings, of this patient and developed relevant assessment and plan.  Additionally, I have reviewed and concur with the physician assistant's documentation above.  Bary Leriche, PA-C   Izora Ribas, MD 04/04/2021

## 2021-04-04 NOTE — Discharge Summary (Addendum)
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   aspirin 81 MG EC tablet Take 1 tablet (81 mg total) by mouth daily. Swallow whole. Start taking on: April 05, 2021   atorvastatin 80 MG tablet Commonly known as: LIPITOR Take 1 tablet (80 mg total) by mouth daily.   b complex vitamins capsule Take 1 capsule by mouth daily.   Co Q10 100 MG Caps Take 1 capsule by mouth daily.   JUICE PLUS FIBRE PO Take 3 capsules by mouth in the morning and at bedtime.   Magnesium 500 MG Tabs Take 1 tablet by mouth daily.   MELATONIN PO Take 6 mg by mouth at bedtime.   metoprolol tartrate 25 MG tablet Commonly known as: LOPRESSOR Take 0.5 tablets (12.5 mg total) by mouth 2 (two) times daily.   pantoprazole 40 MG tablet Commonly known as: PROTONIX Take 1 tablet (40 mg total) by mouth daily. Start taking on: April 05, 2021   PROBIOTIC PO Take 1 capsule by mouth daily.   QUERCETIN PO Take 1,000 mg by mouth daily.   VITAMIN K2-VITAMIN D3 PO Take 250 mcg by mouth daily.   Zinc 50 MG Tabs Take 1 tablet by mouth daily.       SIGNIFICANT  DIAGNOSTIC STUDIES CT HEAD WO CONTRAST  Result Date: 03/31/2021 CLINICAL DATA:  Follow-up examination for cerebral hemorrhage. EXAM: CT HEAD WITHOUT CONTRAST TECHNIQUE: Contiguous axial images were obtained from the base of the skull through the vertex without intravenous contrast. COMPARISON:  Prior MRI from earlier the same day as well as previous CTs from 03/30/2021. FINDINGS: Brain: Intraparenchymal hematoma positioned at the right temporal lobe again seen, relatively similar in size and morphology as compared to prior. Mild localized edema is relatively similar without significant regional mass effect. Scattered small volume subarachnoid hemorrhage within the adjacent right temporal occipital region again noted, slightly decreased in conspicuity from prior. Four-5 mm intraparenchymal hemorrhage seen superiorly within the right parietal lobe again noted (series 3, image 29). Intraventricular extension with small volume blood within the occipital horns of both lateral ventricles, slightly decreased from prior. Overall ventricular size is relatively stable without overt hydrocephalus or trapping. Underlying scattered ischemic infarcts involving the right cerebral hemisphere noted, most pronounced at the posterior right frontal region/precentral gyrus and right occipital lobe. Changes better evaluated on recent MRI. Additional subcentimeter left-sided infarct noted on prior MRI not visible by CT. No other new large vessel territory infarct. No mass lesion or extra-axial fluid collection. Underlying atrophy with chronic microvascular ischemic  disease again noted. Vascular: No hyperdense vessel. Skull: Scalp soft tissues and calvarium are stable and within normal limits. Sinuses/Orbits: Right gaze noted. Globes and orbital soft tissues demonstrate no other acute finding. Scattered mucosal thickening noted within the ethmoidal air cells. Paranasal sinuses are otherwise clear. No significant mastoid effusion. Other:  None. IMPRESSION: 1. No significant interval change in size and morphology of right temporal lobe intraparenchymal hematoma. Similar mild localized edema without significant regional mass effect. 2. Slight interval decrease in conspicuity of scattered small volume subarachnoid hemorrhage within the adjacent right temporoccipital region. 3. Slight interval decrease in volume of intraventricular hemorrhage with stable ventricular size. 4. Scattered ischemic infarcts involving the right cerebral hemisphere, better evaluated on recent MRI. Additional subcentimeter left-sided infarct noted on prior MRI not visible by CT. 5. No other new acute intracranial abnormality. Electronically Signed   By: Jeannine Boga M.D.   On: 03/31/2021 05:25   CT HEAD WO CONTRAST  Result Date: 03/30/2021 CLINICAL DATA:  Cerebral hemorrhage suspected. EXAM: CT HEAD WITHOUT CONTRAST TECHNIQUE: Contiguous axial images were obtained from the base of the skull through the vertex without intravenous contrast. COMPARISON:  Head CT performed earlier today 03/30/2021. FINDINGS: Brain: Mild generalized cerebral atrophy. Since the head CT examination performed earlier today at 2:53 p.m., there has been an interval increase in size of an acute parenchymal hemorrhage within the right temporal lobe, now measuring 4.0 x 4.1 x 1.8 cm (previously measuring 3.6 x 3.1 x 1.9 cm). There is now intraventricular extension with small-to-moderate volume hemorrhage within the right lateral ventricle, and small-volume hemorrhage within the occipital horn of the left lateral ventricle. No evidence of hydrocephalus at this time. Small-volume acute subarachnoid hemorrhage overlying the right temporal and occipital lobes has not significantly changed. New 3 mm acute parenchymal hemorrhage or focus of subarachnoid hemorrhage along the high right frontoparietal lobes (series 2, image 29). Interval demarcation of an acute cortical/subcortical infarct within the  right occipital lobe within the right PCA territory. This measures 3.3 x 2.8 cm in transaxial dimensions (for instance as seen on series 2, image 11). No midline shift. Vascular: Redemonstrated punctate calcific foci along the right cerebral hemisphere in the right MCA and right PCA territories, likely reflecting calcified emboli. Skull: No calvarial fracture. Redemonstrated nonspecific thinning of the left parietal calvarium. Sinuses/Orbits: Visualized orbits show no acute finding. Mild mucosal thickening within a posterior right ethmoid air cell. These results were called by telephone at the time of interpretation on 03/30/2021 at 8:16 pm to provider Dr. Rory Percy, who verbally acknowledged these results. IMPRESSION: Interval increase in size of an acute parenchymal hemorrhage within the right temporal lobe, now measuring 4.0 x 4.1 x 1.8 cm. Interval extension of hemorrhage into the ventricular system with small-to-moderate volume hemorrhage in the right lateral ventricle, and small-volume hemorrhage in the occipital horn of the left lateral ventricle. No evidence of hydrocephalus at this time. Small-volume subarachnoid hemorrhage overlying the right temporal and occipital lobes has not significantly changed. New 3 mm acute parenchymal hemorrhage versus focus of subarachnoid hemorrhage along the high right frontoparietal lobes. Interval demarcation of an acute cortical/subcortical infarct within the right occipital lobe (PCA vascular territory), measuring 3.3 x 2.8 cm in transaxial dimensions. Electronically Signed   By: Kellie Simmering DO   On: 03/30/2021 20:26   CT HEAD WO CONTRAST  Result Date: 03/30/2021 CLINICAL DATA:  Stroke, follow-up; SAH monitoring. Additional history provided: TPA follow-up. EXAM: CT HEAD WITHOUT CONTRAST TECHNIQUE: Contiguous axial images were obtained from  the base of the skull through the vertex without intravenous contrast. COMPARISON:  CT angiogram head/neck 03/30/2021. Noncontrast head  CT examinations 03/30/2021 and earlier. FINDINGS: Brain: Mild generalized cerebral atrophy. New from the head CT performed earlier today there is a parenchymal hemorrhage within the posterior right temporal lobe measuring 3.6 x 3.1 x 1.9 cm. Small volume subarachnoid hemorrhage along the adjacent right temporal occipital lobes appears slightly increased. No new loss of gray-white differentiation is appreciated. Stable chronic small vessel ischemic changes within the cerebral white matter. No evidence of hydrocephalus.  No midline shift. Vascular: Atherosclerotic calcifications. Redemonstrated punctate foci of calcification along the right cerebral hemisphere within the right MCA and PCA vascular territories suspicious for calcified emboli. Skull: No calvarial fracture. Redemonstrated nonspecific thinning of the left parietal calvarium. Sinuses/Orbits: Visualized orbits show no acute finding. Partial opacification of a posterior right ethmoid air cell. Background trace mucosal thickening within the bilateral ethmoid sinuses. Mild mucosal thickening within the inferior left maxillary sinus. These results were called by telephone at the time of interpretation on 03/30/2021 at 3:20 pm to provider [facility] , who verbally acknowledged these results. IMPRESSION: 3.6 x 3.1 x 1.9 cm acute parenchymal hemorrhage within the posterior right temporal lobe, new from the head CT performed earlier today. Small-volume acute subarachnoid hemorrhage along the adjacent right temporal and occipital lobes, mildly increased. No new loss of gray-white differentiation is identified. Stable chronic small vessel ischemic changes within the cerebral white matter. Electronically Signed   By: Kellie Simmering DO   On: 03/30/2021 15:23   CT HEAD WO CONTRAST  Result Date: 03/30/2021 CLINICAL DATA:  Neuro deficit, acute, stroke suspected; mental status change after receiving tPA EXAM: CT HEAD WITHOUT CONTRAST CT ANGIOGRAPHY OF THE HEAD AND  NECK TECHNIQUE: Contiguous axial images were obtained from the base of the skull through the vertex without intravenous contrast. Multidetector CT imaging of the head and neck was performed using the standard protocol during bolus administration of intravenous contrast. Multiplanar CT image reconstructions and MIPs were obtained to evaluate the vascular anatomy. Carotid stenosis measurements (when applicable) are obtained utilizing NASCET criteria, using the distal internal carotid diameter as the denominator. CONTRAST:  148m OMNIPAQUE IOHEXOL 350 MG/ML SOLN COMPARISON:  None. FINDINGS: CT HEAD Brain: Small volume sulcal subarachnoid hemorrhage is again identified in the right temporo-occipital region. There is no new loss of gray-white differentiation. Punctate foci of calcification are identified within the right posterior temporal region, right posterior sylvian fissure, right occipital lobe, and right perimesencephalic cistern. Stable findings of chronic microvascular ischemic changes. Ventricle size is stable. Vascular: Residual contrast from prior CTA. Skull: No new finding. Sinuses/Orbits: No acute finding. Other: None. Review of the MIP images confirms the above findings CTA NECK Aortic arch: Mild aneurysmal dilatation of the ascending thoracic aorta similar to 2021 chest CT. Great vessel origins remain patent. Right carotid system: Remains patent. Plaque along the proximal internal carotid causes mild stenosis. Left carotid system: Remains patent. Calcified plaque along the proximal internal carotid causes mild stenosis. Vertebral arteries: Remain patent. Congenitally small right vertebral artery. Skeleton: No new finding. Other neck: No new finding. Upper chest: No new finding. Review of the MIP images confirms the above findings CTA HEAD Anterior circulation: Intracranial internal carotid arteries remain patent. Anterior cerebral arteries are patent. Right M1 and M2 MCA branches are patent. There is a  new focus of calcification along a right M3 MCA branch within the sylvian fissure. However, the vessel appears patent beyond this point.  Left middle cerebral artery is patent. Posterior circulation: As on the prior study, there is poor visualization of the intracranial right vertebral artery. Left vertebral remains patent with calcified plaque causing mild stenosis. Basilar artery is patent. Proximal right posterior cerebral artery is patent, primarily supplied by a posterior communicating artery. There is new occlusion of a P3 PCA branch. Venous sinuses: Patent as allowed by contrast bolus timing. Review of the MIP images confirms the above findings IMPRESSION: Similar small volume sulcal subarachnoid hemorrhage post tPA. Punctate foci of calcification in the right posterior temporal region, right posterior sylvian fissure, right occipital lobe, and right perimesencephalic cistern. Some of these are new since the initial study earlier today reflecting calcific emboli. The right sylvian calcification is along a right M3 MCA branch. The vessel appears patent beyond this point. Right perimesencephalic calcification is along a right P3 PCA branch, which is newly occluded since the earlier CTA. As before, there is poor visualization of intracranial right vertebral artery reflecting high-grade stenosis or occlusion. These results were called by telephone at the time of interpretation on 03/30/2021 at time of study completion and again at 12:39 pm to provider [facility] , who verbally acknowledged these results. Electronically Signed   By: Macy Mis M.D.   On: 03/30/2021 12:49   CT HEAD WO CONTRAST  Addendum Date: 03/30/2021   ADDENDUM REPORT: 03/30/2021 11:49 ADDENDUM: Study discussed by telephone with Dr. Lesleigh Noe on 03/30/2021 at 1116 hours. And also I discussed this scan again with Dr. Erlinda Hong at 1145 hours. He notes a new vessel related density in the right sylvian fissure from the two prior noncontrast  Head CTs today - see series 5, image 17. And this appears to correspond to the level of a Right MCA posterior M3 branch when correlated with the earlier sagittal thin CTA images. He advises that the patient continues to have progression of posterior circulation type symptoms. And they may repeat a CTA Head and Neck. If so attention can also be directed to this particular vessel. Electronically Signed   By: Genevie Ann M.D.   On: 03/30/2021 11:49   Result Date: 03/30/2021 CLINICAL DATA:  75 year old male code stroke presentation this morning with worsening NIH scale status post IV tPA. EXAM: CT HEAD WITHOUT CONTRAST TECHNIQUE: Contiguous axial images were obtained from the base of the skull through the vertex without intravenous contrast. COMPARISON:  Plain head CT 0835 hours today. FINDINGS: Brain: Residual intravascular contrast. There is a trace amount of new subarachnoid density along the junction of the posterior right temporal and occipital lobes compatible with trace subarachnoid hemorrhage. See series 5, image 15 and series 4, image 53. This tracks anteriorly toward the inferior right temporal gyrus. No regional mass effect. No regional cytotoxic edema identified. No other acute intracranial hemorrhage identified. No IVH. No cortically based acute infarct identified. No midline shift, mass effect, or evidence of intracranial mass lesion. No ventriculomegaly. Gray-white matter differentiation appears stable since 0835 hours, patchy white matter hypodensity. Vascular: Calcified atherosclerosis at the skull base. The major intracranial vascular structures appear to remain enhancing. Skull: Negative. Sinuses/Orbits: Visualized paranasal sinuses and mastoids are stable and well aerated. Other: Visualized orbits and scalp soft tissues are within normal limits. IMPRESSION: 1. Positive for trace new subarachnoid hemorrhage along the inferior right hemisphere at the junction of the posterior temporal and occipital  lobes. No associated mass effect. 2. But no acute cortically based infarct identified and otherwise stable since 0835 hours today. 3. These results  were communicated to Dr. Curly Shores at 10:57 am on 03/30/2021 by text page via the [facility] messaging system. Electronically Signed: By: Genevie Ann M.D. On: 03/30/2021 10:57   MR BRAIN WO CONTRAST  Result Date: 03/31/2021 CLINICAL DATA:  Initial evaluation for acute headache, intracranial hemorrhage. EXAM: MRI HEAD WITHOUT CONTRAST TECHNIQUE: Multiplanar, multiecho pulse sequences of the brain and surrounding structures were obtained without intravenous contrast. COMPARISON:  Prior CTs from 03/30/21. FINDINGS: Brain: Examination technically limited as the patient was unable to tolerate the full length of the study. Diffusion and sagittal T1 weighted sequences only were performed. Additionally, provided images are moderately degraded by motion. Multifocal ischemic infarcts involving the right frontal, parietal, temporal, and occipital lobes are seen, right MCA and PCA distributions. Largest areas of involvement seen at the posterior right frontal region at the level of the precentral gyrus (series 3, image 40). Additional moderate size confluent area of involvement at the right occipital lobe, measuring up to 3.5 cm in size. Mild patchy involvement of the right hippocampus. Few additional scattered subcentimeter infarcts noted involving the periventricular and deep white matter. Superimposed intraparenchymal hematoma at the right temporal lobe is grossly similar, measuring approximately 4.1 x 3.2 cm on this limited exam. No visible underlying lesion. Scattered small volume subarachnoid hemorrhage within the posterior right temporal occipital region is grossly similar as well. Known intraventricular extension not well seen on this exam. No significant regional mass effect or midline shift. No hydrocephalus or trapping. Basilar cisterns remain patent. Additional note made of a 7  mm ischemic infarct involving the mesial left occipital lobe (series 3, image 19). No other visible acute intracranial abnormality seen on this limited exam. No visible mass lesion or extra-axial fluid collection. Vascular: Not assessed on this limited study. Skull and upper cervical spine: Craniocervical junction within normal limits. Bone marrow signal intensity normal. No scalp soft tissue abnormality. Sinuses/Orbits: Grossly negative on this limited exam. Other: None. IMPRESSION: 1. Technically limited exam due to patient's inability to tolerate the full length of the study and motion artifact. Diffusion and sagittal T1 weighted sequences only were performed. 2. Multifocal ischemic infarcts involving the right cerebral hemisphere as above, right MCA and PCA distributions. 3. Grossly similar 4.1 x 3.2 cm intraparenchymal hematoma involving the right temporal lobe. Scattered small volume subarachnoid hemorrhage grossly similar as well. Known intraventricular extension not well seen on this exam. No significant regional mass effect or midline shift. No hydrocephalus or trapping. 4. Additional 7 mm acute ischemic infarct involving the mesial left occipital lobe. Electronically Signed   By: Jeannine Boga M.D.   On: 03/31/2021 03:49   DG Swallowing Func-Speech Pathology  Result Date: 04/01/2021 Formatting of this result is different from the original. Objective Swallowing Evaluation: Type of Study: MBS-Modified Barium Swallow Study  Patient Details Name: Shubham Kniffin MRN: [id number] Date of Birth: 09/28/1945 Today's Date: 04/01/2021 Time: SLP Start Time (ACUTE ONLY): 1053 -SLP Stop Time (ACUTE ONLY): 1117 SLP Time Calculation (min) (ACUTE ONLY): 24 min Past Medical History: Past Medical History: Diagnosis Date  Pneumonia due to COVID-19 virus 01/10/2020 Past Surgical History: Past Surgical History: Procedure Laterality Date  HERNIA REPAIR   HPI: 75 yo male s/p admitted to [facility] with L hand weakness and L facial  droop.  He is  s/p tPA with worsening of symptoms  post admistration.  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 Received reversal medication. PMHx: COVID PNA  with ongoing issues with SHOB.  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  Patient reports seeing ST for services at [facility] to address swallowing defiicts post treatment.  Subjective: pt alert and cooperative with L sided deficits noted Assessment / Plan / Recommendation CHL IP CLINICAL IMPRESSIONS 04/01/2021 Clinical Impression Pt has a mild oropharyngeal dysphagia that may be a combination of acute neurological changes paired with mild, chronic dysphagia s/p XRT. He has reduced labial seal that results in mild L-sided anterior loss with thin liquids, with pt showing some awareness by asking intermittently for his washcloth to wipe his mouth. He has some reduced lingual coordination with thin liquids as well, appearing more functional with thicker liquids and purees. With small, single sips this is more controlled, but when he starts to get a little more volume or if he takes consecutive boluses, his pharyngeal timing is also impacted and he has aspiration before the swallow. This was sensed by either a throat clear or a cough, neither of which were effective at clearing the airway. A chin tuck resulted in mildly increased pharyngeal residuals (pt also has mild oral residue with thin liquids that spills posteriorly after the swallow), which were aspirated after the swallow, and this aspiration was silent. A head turn to the L seemed to be the most protective of his airway during this study. Pt also had improved airway protection and more efficiency with swallowing given nectar thick liquids and purees. Mild-moderate vallecular residue was present with regular solids. Recommend starting Dys 3 diet  and nectar thick liquids. Pt could still have a few pieces of ice after oral care is completed. SLP will f/u for potential to utilize head turn left as a compensatory strategy to facilitate return to thin liquids. SLP Visit Diagnosis Dysphagia, oropharyngeal phase (R13.12) Attention and concentration deficit following -- Frontal lobe and executive function deficit following -- Impact on safety and function Mild aspiration risk;Moderate aspiration risk   CHL IP TREATMENT RECOMMENDATION 04/01/2021 Treatment Recommendations Therapy as outlined in treatment plan below   Prognosis 04/01/2021 Prognosis for Safe Diet Advancement Good Barriers to Reach Goals Cognitive deficits Barriers/Prognosis Comment -- CHL IP DIET RECOMMENDATION 04/01/2021 SLP Diet Recommendations Dysphagia 3 (Mech soft) solids;Nectar thick liquid Liquid Administration via Cup;Straw Medication Administration Whole meds with puree Compensations Minimize environmental distractions;Slow rate;Small sips/bites Postural Changes Seated upright at 90 degrees;Remain semi-upright after after feeds/meals (Comment)   CHL IP OTHER RECOMMENDATIONS 04/01/2021 Recommended Consults -- Oral Care Recommendations Oral care BID Other Recommendations Order thickener from pharmacy;Prohibited food (jello, ice cream, thin soups);Remove water pitcher   CHL IP FOLLOW UP RECOMMENDATIONS 04/01/2021 Follow up Recommendations Inpatient Rehab   CHL IP FREQUENCY AND DURATION 04/01/2021 Speech Therapy Frequency (ACUTE ONLY) min 2x/week Treatment Duration 2 weeks      CHL IP ORAL PHASE 04/01/2021 Oral Phase Impaired Oral - Pudding Teaspoon -- Oral - Pudding Cup -- Oral - Honey Teaspoon -- Oral - Honey Cup -- Oral - Nectar Teaspoon -- Oral - Nectar Cup WFL Oral - Nectar Straw WFL Oral - Thin Teaspoon -- Oral - Thin Cup Lingual/palatal residue;Left anterior bolus loss Oral - Thin Straw Left anterior bolus loss;Lingual/palatal residue Oral - Puree WFL Oral - Mech Soft -- Oral - Regular WFL Oral -  Multi-Consistency -- Oral - Pill -- Oral Phase - Comment --  CHL IP PHARYNGEAL PHASE 04/01/2021 Pharyngeal Phase Impaired Pharyngeal- Pudding Teaspoon -- Pharyngeal -- Pharyngeal-  Pudding Cup -- Pharyngeal -- Pharyngeal- Honey Teaspoon -- Pharyngeal -- Pharyngeal- Honey Cup -- Pharyngeal -- Pharyngeal- Nectar Teaspoon -- Pharyngeal -- Pharyngeal- Nectar Cup Pharyngeal residue - valleculae Pharyngeal -- Pharyngeal- Nectar Straw Pharyngeal residue - valleculae Pharyngeal -- Pharyngeal- Thin Teaspoon -- Pharyngeal -- Pharyngeal- Thin Cup Penetration/Aspiration before swallow;Pharyngeal residue - valleculae;Pharyngeal residue - pyriform Pharyngeal Material enters airway, passes BELOW cords and not ejected out despite cough attempt by patient Pharyngeal- Thin Straw Penetration/Aspiration before swallow;Penetration/Apiration after swallow;Compensatory strategies attempted (with notebox);Pharyngeal residue - valleculae;Pharyngeal residue - pyriform Pharyngeal Material enters airway, passes BELOW cords and not ejected out despite cough attempt by patient;Material enters airway, passes BELOW cords without attempt by patient to eject out (silent aspiration) Pharyngeal- Puree WFL Pharyngeal -- Pharyngeal- Mechanical Soft -- Pharyngeal -- Pharyngeal- Regular Reduced tongue base retraction;Pharyngeal residue - valleculae Pharyngeal -- Pharyngeal- Multi-consistency -- Pharyngeal -- Pharyngeal- Pill -- Pharyngeal -- Pharyngeal Comment --  CHL IP CERVICAL ESOPHAGEAL PHASE 04/01/2021 Cervical Esophageal Phase Impaired Pudding Teaspoon -- Pudding Cup -- Honey Teaspoon -- Honey Cup -- Nectar Teaspoon -- Nectar Cup Reduced cricopharyngeal relaxation Nectar Straw Reduced cricopharyngeal relaxation Thin Teaspoon -- Thin Cup Reduced cricopharyngeal relaxation Thin Straw Reduced cricopharyngeal relaxation Puree Reduced cricopharyngeal relaxation Mechanical Soft -- Regular Reduced cricopharyngeal relaxation Multi-consistency -- Pill --  Cervical Esophageal Comment -- Osie Bond., M.A. CCC-SLP Acute [facility] Services Pager [phone] Office [phone] 04/01/2021, 1:00 PM              ECHOCARDIOGRAM COMPLETE  Result Date: 03/31/2021    ECHOCARDIOGRAM REPORT   Patient Name:   Muaad Lipton  Date of Exam: 03/31/2021 Medical Rec #:  [id number]  Height:       73.0 in Accession #:    [id number] Weight:       219.4 lb Date of Birth:  1946-06-24  BSA:          2.238 m Patient Age:    24 years   BP:           129/68 mmHg Patient Gender: M          HR:           71 bpm. Exam Location:  Inpatient Procedure: 2D Echo, 3D Echo, Cardiac Doppler and Color Doppler Indications:    Stroke  History:        Patient has no prior history of Echocardiogram examinations.                 Stroke. Pulmonary embolus.  Sonographer:    Roseanna Rainbow RDCS Referring Phys: [id number] ERIC LINDZEN  Sonographer Comments: Technically difficult study due to poor echo windows and suboptimal subcostal window. Patient could not follow directions to hold his breath. IMPRESSIONS  1. The aortic valve is tricuspid. Aortic valve regurgitation is moderate. Aortic regurgitation PHT measures 436 msec.  2. Aortic dilatation noted. There is moderate dilatation of the ascending aorta, measuring 48 mm.  3. Left ventricular ejection fraction, by estimation, is 60 to 65%. The left ventricle has normal function. The left ventricle has no regional wall motion abnormalities. There is mild concentric left ventricular hypertrophy. Left ventricular diastolic parameters are consistent with Grade I diastolic dysfunction (impaired relaxation).  4. Right ventricular systolic function is normal. The right ventricular size is normal. Tricuspid regurgitation signal is inadequate for assessing PA pressure.  5. The mitral valve is normal in structure. Trivial mitral valve regurgitation.  6. The inferior vena cava is normal in size with greater than 50% respiratory variability, suggesting  right atrial pressure of 3 mmHg.  Comparison(s): No prior Echocardiogram. Conclusion(s)/Recommendation(s): No intracardiac source of embolism detected on this transthoracic study. A transesophageal echocardiogram is recommended to exclude cardiac source of embolism if clinically indicated. FINDINGS  Left Ventricle: Left ventricular ejection fraction, by estimation, is 60 to 65%. The left ventricle has normal function. The left ventricle has no regional wall motion abnormalities. The left ventricular internal cavity size was normal in size. There is  mild concentric left ventricular hypertrophy. Left ventricular diastolic parameters are consistent with Grade I diastolic dysfunction (impaired relaxation). Right Ventricle: The right ventricular size is normal. Right vetricular wall thickness was not well visualized. Right ventricular systolic function is normal. Tricuspid regurgitation signal is inadequate for assessing PA pressure. Left Atrium: Left atrial size was normal in size. Right Atrium: Right atrial size was normal in size. Pericardium: There is no evidence of pericardial effusion. Mitral Valve: The mitral valve is normal in structure. Trivial mitral valve regurgitation. Tricuspid Valve: The tricuspid valve is grossly normal. Tricuspid valve regurgitation is trivial. Aortic Valve: The aortic valve is tricuspid. Aortic valve regurgitation is moderate. Aortic regurgitation PHT measures 436 msec. Pulmonic Valve: The pulmonic valve was grossly normal. Pulmonic valve regurgitation is mild. No evidence of pulmonic stenosis. Aorta: Aortic dilatation noted. There is moderate dilatation of the ascending aorta, measuring 48 mm. Venous: The inferior vena cava is normal in size with greater than 50% respiratory variability, suggesting right atrial pressure of 3 mmHg. IAS/Shunts: The atrial septum is grossly normal.  LEFT VENTRICLE PLAX 2D LVIDd:         5.10 cm      Diastology LVIDs:         3.60 cm      LV e' medial:    5.55 cm/s LV PW:         1.20 cm       LV E/e' medial:  17.9 LV IVS:        1.30 cm      LV e' lateral:   12.70 cm/s LVOT diam:     2.10 cm      LV E/e' lateral: 7.8 LV SV:         92 LV SV Index:   41 LVOT Area:     3.46 cm  LV Volumes (MOD) LV vol d, MOD A2C: 142.0 ml LV vol d, MOD A4C: 159.0 ml LV vol s, MOD A2C: 40.6 ml LV vol s, MOD A4C: 65.2 ml LV SV MOD A2C:     101.4 ml LV SV MOD A4C:     159.0 ml LV SV MOD BP:      96.3 ml RIGHT VENTRICLE RV S prime:     18.50 cm/s TAPSE (M-mode): 1.6 cm LEFT ATRIUM             Index       RIGHT ATRIUM          Index LA diam:        3.30 cm 1.47 cm/m  RA Area:     9.72 cm LA Vol (A2C):   55.7 ml 24.89 ml/m RA Volume:   16.30 ml 7.28 ml/m LA Vol (A4C):   69.8 ml 31.19 ml/m LA Biplane Vol: 63.6 ml 28.42 ml/m  AORTIC VALVE             PULMONIC VALVE LVOT Vmax:   136.00 cm/s PR End Diast Vel: 1.74 msec LVOT Vmean:  87.350 cm/s LVOT VTI:    0.266 m  AI PHT:      436 msec  AORTA Ao Root diam: 4.30 cm Ao Asc diam:  4.70 cm MITRAL VALVE MV Area (PHT): 2.50 cm     SHUNTS MV Decel Time: 303 msec     Systemic VTI:  0.27 m MV E velocity: 99.20 cm/s   Systemic Diam: 2.10 cm MV A velocity: 101.00 cm/s MV E/A ratio:  0.98 Rudean Haskell MD Electronically signed by Rudean Haskell MD Signature Date/Time: 03/31/2021/12:40:57 PM    Final    CT HEAD CODE STROKE WO CONTRAST  Result Date: 03/30/2021 CLINICAL DATA:  Code stroke.  Neuro deficit, acute, stroke suspected EXAM: CT HEAD WITHOUT CONTRAST TECHNIQUE: Contiguous axial images were obtained from the base of the skull through the vertex without intravenous contrast. COMPARISON:  None. FINDINGS: Brain: There is no acute intracranial hemorrhage, mass effect, or edema. Gray-white differentiation is preserved. Patchy and confluent areas of low-attenuation in the supratentorial white matter are nonspecific probably reflect moderate chronic microvascular ischemic changes. Prominence of the ventricles and sulci reflects minor generalized parenchymal volume loss.  No extra-axial collection. Vascular: No hyperdense vessel. There is intracranial atherosclerotic calcification at the skull base. Skull: Unremarkable. Sinuses/Orbits: No acute abnormality. Other: Mastoid air cells are clear. ASPECTS ([place] Stroke Program Early CT Score) - Ganglionic level infarction (caudate, lentiform nuclei, internal capsule, insula, M1-M3 cortex): 7 - Supraganglionic infarction (M4-M6 cortex): 3 Total score (0-10 with 10 being normal): 10 IMPRESSION: There is no acute intracranial hemorrhage or evidence of acute infarction. ASPECT score is 10. These results were communicated to Dr. Curly Shores at 8:52 am on 03/30/2021 by text page via the [facility] messaging system. Electronically Signed   By: Macy Mis M.D.   On: 03/30/2021 08:55   VAS [place] LOWER EXTREMITY VENOUS (DVT)  Result Date: 04/03/2021  Lower Venous DVT Study Patient Name:  RIAD SLEMMER  Date of Exam:   04/03/2021 Medical Rec #: [id number]  Accession #:    [id number] Date of Birth: 1946-05-26  Patient Gender: M Patient Age:   75Y Exam Location:  [facility] Procedure:      VAS [place] LOWER EXTREMITY VENOUS (DVT) Referring Phys: [id number] Jory Welke S Kaleia Longhi --------------------------------------------------------------------------------  Indications: Phlebitis/throbophlebitis.  Comparison Study: no prior Performing Technologist: Archie Patten RVS  Examination Guidelines: A complete evaluation includes B-mode imaging, spectral Doppler, color Doppler, and power Doppler as needed of all accessible portions of each vessel. Bilateral testing is considered an integral part of a complete examination. Limited examinations for reoccurring indications may be performed as noted. The reflux portion of the exam is performed with the patient in reverse Trendelenburg.  +---------+---------------+---------+-----------+----------+--------------+ RIGHT    CompressibilityPhasicitySpontaneityPropertiesThrombus Aging  +---------+---------------+---------+-----------+----------+--------------+ CFV      Full           Yes      Yes                                 +---------+---------------+---------+-----------+----------+--------------+ SFJ      Full                                                        +---------+---------------+---------+-----------+----------+--------------+ FV Prox  Full                                                        +---------+---------------+---------+-----------+----------+--------------+  FV Mid   Full                                                        +---------+---------------+---------+-----------+----------+--------------+ FV DistalFull                                                        +---------+---------------+---------+-----------+----------+--------------+ PFV      Full                                                        +---------+---------------+---------+-----------+----------+--------------+ POP      Full           Yes      Yes                                 +---------+---------------+---------+-----------+----------+--------------+ PTV      Full                                                        +---------+---------------+---------+-----------+----------+--------------+ PERO     Full                                                        +---------+---------------+---------+-----------+----------+--------------+   +---------+---------------+---------+-----------+----------+--------------+ LEFT     CompressibilityPhasicitySpontaneityPropertiesThrombus Aging +---------+---------------+---------+-----------+----------+--------------+ CFV      Full           Yes      Yes                                 +---------+---------------+---------+-----------+----------+--------------+ SFJ      Full                                                         +---------+---------------+---------+-----------+----------+--------------+ FV Prox  Full                                                        +---------+---------------+---------+-----------+----------+--------------+ FV Mid   Full                                                        +---------+---------------+---------+-----------+----------+--------------+  FV DistalFull                                                        +---------+---------------+---------+-----------+----------+--------------+ PFV      Full                                                        +---------+---------------+---------+-----------+----------+--------------+ POP      Full           Yes      Yes                                 +---------+---------------+---------+-----------+----------+--------------+ PTV      Full                                                        +---------+---------------+---------+-----------+----------+--------------+ PERO     Full                                                        +---------+---------------+---------+-----------+----------+--------------+     Summary: BILATERAL: - No evidence of deep vein thrombosis seen in the lower extremities, bilaterally. -   *See table(s) above for measurements and observations. Electronically signed by Deitra Mayo MD on 04/03/2021 at 5:02:33 PM.    Final    CT ANGIO HEAD CODE STROKE  Result Date: 03/30/2021 CLINICAL DATA:  Neuro deficit, acute, stroke suspected; mental status change after receiving tPA EXAM: CT HEAD WITHOUT CONTRAST CT ANGIOGRAPHY OF THE HEAD AND NECK TECHNIQUE: Contiguous axial images were obtained from the base of the skull through the vertex without intravenous contrast. Multidetector CT imaging of the head and neck was performed using the standard protocol during bolus administration of intravenous contrast. Multiplanar CT image reconstructions and MIPs were obtained to evaluate  the vascular anatomy. Carotid stenosis measurements (when applicable) are obtained utilizing NASCET criteria, using the distal internal carotid diameter as the denominator. CONTRAST:  139m OMNIPAQUE IOHEXOL 350 MG/ML SOLN COMPARISON:  None. FINDINGS: CT HEAD Brain: Small volume sulcal subarachnoid hemorrhage is again identified in the right temporo-occipital region. There is no new loss of gray-white differentiation. Punctate foci of calcification are identified within the right posterior temporal region, right posterior sylvian fissure, right occipital lobe, and right perimesencephalic cistern. Stable findings of chronic microvascular ischemic changes. Ventricle size is stable. Vascular: Residual contrast from prior CTA. Skull: No new finding. Sinuses/Orbits: No acute finding. Other: None. Review of the MIP images confirms the above findings CTA NECK Aortic arch: Mild aneurysmal dilatation of the ascending thoracic aorta similar to 2021 chest CT. Great vessel origins remain patent. Right carotid system: Remains patent. Plaque along the proximal internal carotid causes mild stenosis. Left carotid system: Remains patent. Calcified plaque along the proximal internal carotid causes  mild stenosis. Vertebral arteries: Remain patent. Congenitally small right vertebral artery. Skeleton: No new finding. Other neck: No new finding. Upper chest: No new finding. Review of the MIP images confirms the above findings CTA HEAD Anterior circulation: Intracranial internal carotid arteries remain patent. Anterior cerebral arteries are patent. Right M1 and M2 MCA branches are patent. There is a new focus of calcification along a right M3 MCA branch within the sylvian fissure. However, the vessel appears patent beyond this point. Left middle cerebral artery is patent. Posterior circulation: As on the prior study, there is poor visualization of the intracranial right vertebral artery. Left vertebral remains patent with calcified plaque  causing mild stenosis. Basilar artery is patent. Proximal right posterior cerebral artery is patent, primarily supplied by a posterior communicating artery. There is new occlusion of a P3 PCA branch. Venous sinuses: Patent as allowed by contrast bolus timing. Review of the MIP images confirms the above findings IMPRESSION: Similar small volume sulcal subarachnoid hemorrhage post tPA. Punctate foci of calcification in the right posterior temporal region, right posterior sylvian fissure, right occipital lobe, and right perimesencephalic cistern. Some of these are new since the initial study earlier today reflecting calcific emboli. The right sylvian calcification is along a right M3 MCA branch. The vessel appears patent beyond this point. Right perimesencephalic calcification is along a right P3 PCA branch, which is newly occluded since the earlier CTA. As before, there is poor visualization of intracranial right vertebral artery reflecting high-grade stenosis or occlusion. These results were called by telephone at the time of interpretation on 03/30/2021 at time of study completion and again at 12:39 pm to provider [facility] , who verbally acknowledged these results. Electronically Signed   By: Macy Mis M.D.   On: 03/30/2021 12:49   CT ANGIO NECK CODE STROKE  Result Date: 03/30/2021 CLINICAL DATA:  Neuro deficit, acute, stroke suspected; mental status change after receiving tPA EXAM: CT HEAD WITHOUT CONTRAST CT ANGIOGRAPHY OF THE HEAD AND NECK TECHNIQUE: Contiguous axial images were obtained from the base of the skull through the vertex without intravenous contrast. Multidetector CT imaging of the head and neck was performed using the standard protocol during bolus administration of intravenous contrast. Multiplanar CT image reconstructions and MIPs were obtained to evaluate the vascular anatomy. Carotid stenosis measurements (when applicable) are obtained utilizing NASCET criteria, using the distal  internal carotid diameter as the denominator. CONTRAST:  117m OMNIPAQUE IOHEXOL 350 MG/ML SOLN COMPARISON:  None. FINDINGS: CT HEAD Brain: Small volume sulcal subarachnoid hemorrhage is again identified in the right temporo-occipital region. There is no new loss of gray-white differentiation. Punctate foci of calcification are identified within the right posterior temporal region, right posterior sylvian fissure, right occipital lobe, and right perimesencephalic cistern. Stable findings of chronic microvascular ischemic changes. Ventricle size is stable. Vascular: Residual contrast from prior CTA. Skull: No new finding. Sinuses/Orbits: No acute finding. Other: None. Review of the MIP images confirms the above findings CTA NECK Aortic arch: Mild aneurysmal dilatation of the ascending thoracic aorta similar to 2021 chest CT. Great vessel origins remain patent. Right carotid system: Remains patent. Plaque along the proximal internal carotid causes mild stenosis. Left carotid system: Remains patent. Calcified plaque along the proximal internal carotid causes mild stenosis. Vertebral arteries: Remain patent. Congenitally small right vertebral artery. Skeleton: No new finding. Other neck: No new finding. Upper chest: No new finding. Review of the MIP images confirms the above findings CTA HEAD Anterior circulation: Intracranial internal carotid arteries remain patent.  Anterior cerebral arteries are patent. Right M1 and M2 MCA branches are patent. There is a new focus of calcification along a right M3 MCA branch within the sylvian fissure. However, the vessel appears patent beyond this point. Left middle cerebral artery is patent. Posterior circulation: As on the prior study, there is poor visualization of the intracranial right vertebral artery. Left vertebral remains patent with calcified plaque causing mild stenosis. Basilar artery is patent. Proximal right posterior cerebral artery is patent, primarily supplied by a  posterior communicating artery. There is new occlusion of a P3 PCA branch. Venous sinuses: Patent as allowed by contrast bolus timing. Review of the MIP images confirms the above findings IMPRESSION: Similar small volume sulcal subarachnoid hemorrhage post tPA. Punctate foci of calcification in the right posterior temporal region, right posterior sylvian fissure, right occipital lobe, and right perimesencephalic cistern. Some of these are new since the initial study earlier today reflecting calcific emboli. The right sylvian calcification is along a right M3 MCA branch. The vessel appears patent beyond this point. Right perimesencephalic calcification is along a right P3 PCA branch, which is newly occluded since the earlier CTA. As before, there is poor visualization of intracranial right vertebral artery reflecting high-grade stenosis or occlusion. These results were called by telephone at the time of interpretation on 03/30/2021 at time of study completion and again at 12:39 pm to provider [facility] , who verbally acknowledged these results. Electronically Signed   By: Macy Mis M.D.   On: 03/30/2021 12:49   CT ANGIO HEAD NECK W WO CM (CODE STROKE)  Result Date: 03/30/2021 CLINICAL DATA:  Neuro deficit, acute, stroke suspected EXAM: CT ANGIOGRAPHY HEAD AND NECK TECHNIQUE: Multidetector CT imaging of the head and neck was performed using the standard protocol during bolus administration of intravenous contrast. Multiplanar CT image reconstructions and MIPs were obtained to evaluate the vascular anatomy. Carotid stenosis measurements (when applicable) are obtained utilizing NASCET criteria, using the distal internal carotid diameter as the denominator. CONTRAST:  58m OMNIPAQUE IOHEXOL 350 MG/ML SOLN COMPARISON:  None. FINDINGS: CT HEAD Brain: There is no acute intracranial hemorrhage. No new loss of gray-white differentiation. No change since recent prior study. Vascular: No hyperdense vessel. Skull:  No new finding. Sinuses/Orbits: No acute finding. Other: None. Review of the MIP images confirms the above findings CTA NECK Aortic arch: Ascending thoracic aorta is probably dilated but is not well evaluated. Calcified plaque along the arch and patent great vessel origins. No high-grade stenosis of the proximal subclavian arteries. Right carotid system: Patent. There is mixed plaque along the common carotid with minimal stenosis. Calcified plaque along the proximal internal carotid without stenosis. Left carotid system: Patent. Minimal calcified plaque along the common carotid without stenosis. Calcified plaque at the bifurcation and internal carotid origin with less than 50% stenosis. Vertebral arteries: Patent. Left vertebral artery is dominant. There is mild vessel irregularity secondary to cervical spine osteophytic spurring. Skeleton: Degenerative changes of the cervical spine. Other neck: Unremarkable. Upper chest: Scarring and fibrotic changes in the included upper lungs. Retained secretions/debris within the esophagus. Review of the MIP images confirms the above findings CTA HEAD Anterior circulation: Intracranial internal carotid arteries patent with calcified plaque but no significant stenosis. Anterior and middle cerebral arteries are patent. Focal marked stenosis of left A2 ACA. Mild atherosclerotic irregularity of distal ACA and MCA branches. Posterior circulation: There is poor opacification of the intracranial right vertebral artery. Basilar artery is patent but irregular with mild stenosis. A right PICA  is not definitely identified. Left PICA origin is patent. Small bilateral AICA origins are noted. Superior cerebellar artery origins are patent. Right posterior communicating artery is present and is the primary supply of the patent right posterior cerebral artery. Left posterior cerebral artery is patent. Venous sinuses: Patent as allowed by contrast bolus timing. Review of the MIP images confirms  the above findings IMPRESSION: No acute intracranial hemorrhage or evidence of acute infarction. 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 Poor visualization of intracranial portion may reflect occlusion or high-grade stenosis. A right PICA if present is not definitely identified. Focal marked stenosis left A2 ACA. No hemodynamically significant stenosis at the ICA origins. Results were reviewed by telephone at the time of interpretation on 03/30/2021 at 10:02 am to provider [facility] , who verbally acknowledged these results. Electronically Signed   By: Macy Mis M.D.   On: 03/30/2021 10:13      HISTORY OF PRESENT ILLNESS  Darko Yeck is an 75 y.o. male veteran with hypertension, hyperlipidemia, prior TIA (on aspirin), ascending aortic aneurysm, carotid artery stenosis, history of smoking, and history of bilateral PE (2021). He presented to [facility] ED earlier this morning for left hand weakness and a code stroke was subsequently called.   He was seen by Dr. Curly Shores who provided the below narrative:   "He was in his normal state of health this morning giving a theology talk.  And right while driving home at [id number] AM he acutely developed left hand weakness.  On presentation to the ED he was evaluated as a code stroke.  Head CT was negative for acute intracranial process and risks and benefits were reviewed with patient and wife with decision to give tPA (start time 9:14 AM, completed at 10:15) for concern for stuttering lacunar stroke.  Initially his symptoms were left facial droop and fluctuating left hand weakness (without drift), left arm ataxia and dysarthria (fluctuating).     On further history he reported he had had a chiropractic neck manipulation the day before and therefore CTA was obtained to assess for dissection.  No dissection was revealed but he was noted to have a very diminutive right vertebral artery with intracranial loss of opacification and no  opacification of a right PICA.  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  En route to CTA he was having worsening weakness and dysarthria for which dry head CT was also repeated just prior to CTA, which appeared stable; blood pressure goal was updated to 140-180 to allow for brainstem perfusion in the setting of the significant stenosis.  However he continued to worsen, briefly had a blood pressure in the 190s which improved with a dose of labetalol, and began to complain of a severe headache, with examination notable for brainstem findings of new nystagmus most notable in upgaze (pendular side to side), loss of uvula elevation on the right, and pseudobulbar affect.     Head CT was again repeated which showed trace subarachnoid hemorrhage on the right near the sylvian fissure.  Blood pressure goal was updated to less than 140, but given high risk of his brainstem stroke progression tPA reversal was not immediately pursued.  On repeat examination at about 11:30 AM he was noted to additionally have left-sided visual field cut as well as some left-sided neglect. Head CT was essentially stable with minimal progression of the [facility], and therefore CTA was repeated to evaluate for potential R MCA occlusion. Right M3 occlusion as well  as right PCA occlusion was noted. Case discussed extensively with Dr. Tamera Punt (neuroIR) and Dr. Lavera Guise (Stroke neurology). Given difficulty of approach to R PCA, likely to require stenting of the L vert occlusion followed by traversal of a second stenotic area, as well as SAH which would make DAPT required to prevent in-stent thrombosis very risky with regards to worsening bleeding, decided to defer intervention.   BP goal was increased to 150-170 SBP with improvement of brainstem findings, then reduced to 140-160, with stable R PCA related findings (hemianopia, inability to look to the left), but also improved left sided weakness. Headache was slightly worse but  generally stable (at worst patient reported 6/10, which improved to 4/10 and most recently 4.5/10; wife notes he has a very high pain tolerance).  Stability head CT revealed temporal lobe intraparenchymal hemorrhage, after which decision was made to reverse with TXA.  At that time the patient's examination remained stable, NIH of 6 for mild drowsiness, right gaze preference, left hemianopia, left facial droop that is mild, limb ataxia in the left upper extremity, mild dysarthria (one-point for each finding except 2 points for the hemianopia)   On initial history he reported some chronic intermittent left hand tingling managed by chiropractic manipulation, chronic neck pain managed by chiropractic manipulation (high-speed manipulation, last performed 7/28), an episode of chest pain and left hand weakness the week prior to presentation which fully resolved, in the setting of increasing in his exercise from 2 miles daily walking to 3 miles daily walking.  He reports prior heavier alcohol use and reports having cut back to no more than 7 drinks per week, and no more than 2 drinks per day.  Denies any other substance use.  Reports chronic shortness of breath that waxes and wanes since his COVID diagnosis in May 2021.   LKW: 7:30 AM on 7/29 tPA given?: No, or if yes, time given 9:13 AM Premorbid modified rankin scale:     0 - No symptoms.   Hunt & Hess score 1: Mild Headache, Alert and Oriented, Minimal (if any) Nuchal Rigidity Patients with Grade I have an approximate 30% mortality."   Currently the patient has the same deficits and symptoms as he had when he left [facility] to be transported to [facility].    Home medications include ASA.    DISCHARGE EXAM General: Appears well-developed. No acute distress Psych: Affect appropriate to situation Eyes: No scleral injection HENT: No OP obstrucion Head: Normocephalic. Cardiovascular: Normal rate and regular rhythm. Respiratory: Effort normal and breath sounds  normal to anterior ascultation GI: Soft.  No distension. There is no tenderness. Skin: WDI     Neurological Examination Mental Status: Alert, awake. Interactive. Oriented to person, place (hospital) and year, month.Speech fluent without evidence of aphasia, but is moderately dysarthric. Able to follow 3 step commands without difficulty. Cranial Nerves: II: Left dense homonomous hemianopia III,IV, VI: ptosis not present, extra-ocular motions intact bilaterally, pupils equal, round, reactive to light and accommodation V,VII: mild left facial droop, facial light touch sensation normal bilaterally VIII: hearing normal bilaterally IX,X: uvula rises symmetrically XI: bilateral shoulder shrug XII: midline tongue extension Motor: Right : Upper extremity   5/5                                      Left:     Upper extremity   5/5  Lower extremity   5/5                                                  Lower extremity   5/5 Tone and bulk:normal tone throughout; no atrophy noted Sensory: Pinprick and light touch intact throughout, bilaterally Deep Tendon Reflexes: 2+ and symmetric throughout Plantars: Right: downgoing                               Left: downgoing Cerebellar: normal finger-to-nose, normal rapid alternating movements and normal heel-to-shin test Gait: normal gait and station  DISCHARGE PLAN Mr. Elber Goscinski is a 75 y.o. male with history of hypertension who presented to [facility] ED with left sided weakness and was found to have a right PCA occlusion complicated by subsequent development of subarachnoid (Hunt & Hess Grade 1) and intraparenchymal hemorrhaging after receiving tPA. Now status post TXA and 10U cryoprecipitate administration. He was subsequently transferred to [facility] for ongoing evaluation and monitoring.   #Stroke: R MCA and PCA stroke in the setting of new onset atrial fibrillation (noted on 04/03/21)  CT Head: No acute abnormality. Small vessel disease. Atrophy.  ASPECTS 10. CTA Head & Neck: stenosis of left A2 ACA, congenitally small caliber right vertebral artery. F/u CTA: occlusion of P3 Repeat CTH Head: Post IVTPA developed SAH + hemorrhagic conversion of stroke with IPH to the right temporal lobe  MRI Brain WO Contrast: Multifocal ischemic infarcts involving the right cerebral. Additional 7 mm acute ischemic infarct involving the mesial left occipital lobe hemisphere, right MCA and PCA distributions. 2D Echo: EF 65%. No thrombus. LA normal in size.  LDL 184 HgbA1c 5.8 VTE prophylaxis - SCDs Stroke dysphagia screening/diet: NDD3 mechanical soft diet  Stroke prevention: Aspirin 81 mg QD + Atorvastatin 80 mg QD. Will continue Aspirin at this time and recommend to repeat a CTH in one month on 04/27/21 to decide on initiating anticoagulation with eliquis for atrial fibrillation  Therapy recommendations: CIR recommended  #Stroke Dysphagia Screening  Failed swallow evaluation, evaluated by ST and ultimately they recommended a Dysphagia 3 diet. He will discharge to CIR with this diet.    #Hypertension #Atrial Fibrillation  Home meds:  [name] '20mg'$  Current regimen for hypertension and atrial fibrillation: Lisinopril 10 mg QD + Metoprolol 12.5 mg BID  Cardiology saw him on 04/04/21 for new onset atrial fibrillation, they recommend to continue Metoprolol and for him to wear a 14 day cardiac even monitor to evaluate atrial fibrillation burden.  Current blood pressure goal is normotension    #Hyperlipidemia LDL 184, goal < 70 Lipitor '80mg'$  qhs started this admission, will continue statin at discharge   Other Stroke Risk Factors Advanced Age >/= 54 Former Cigarette smoker  Ruta Hinds, NP  Stroke Service Nurse Practitioner  Patient discussed with attending physician.   35 minutes were spent preparing discharge.  I have personally obtained history,examined this patient, reviewed notes, independently viewed imaging studies, participated in medical  decision making and plan of care.ROS completed by me personally and pertinent positives fully documented  I have made any additions or clarifications directly to the above note. Agree with note above.    Antony Contras, MD Medical Director [place] Pager: [phone] 04/04/2021 2:11 PM

## 2021-04-04 NOTE — Progress Notes (Addendum)
Progress Note  Patient Name: Geoffrey Wells Date of Encounter: 04/04/2021  [facility] Cardiologist: New to [facility]  Subjective   Continues to have fatigue. Experiencing some chills this morning. No complaints of chest pain, SOB, or palpitations.   Inpatient Medications    Scheduled Meds:  aspirin EC  81 mg Oral Daily   atorvastatin  80 mg Oral Daily   Chlorhexidine Gluconate Cloth  6 each Topical Q0600   lisinopril  10 mg Oral Daily   metoprolol tartrate  12.5 mg Oral BID   pantoprazole  40 mg Oral Daily   Continuous Infusions:  sodium chloride     PRN Meds: acetaminophen **OR** acetaminophen (TYLENOL) oral liquid 160 mg/5 mL **OR** acetaminophen, labetalol, melatonin, senna-docusate   Vital Signs    Vitals:   04/04/21 0347 04/04/21 0538 04/04/21 0540 04/04/21 0717  BP: (!) 165/71 (!) 161/76 133/62 (!) 139/57  Pulse: 68 68 70 66  Resp: 17   18  Temp: 98.9 F (37.2 C)   98.3 F (36.8 C)  TempSrc:    Oral  SpO2: 98%   96%   No intake or output data in the 24 hours ending 04/04/21 0953 Last 3 Weights 03/30/2021 01/19/2020 01/18/2020  Weight (lbs) 219 lb 6.4 oz 296 lb 296 lb  Weight (kg) 99.519 kg 134.265 kg 134.265 kg      Telemetry    Sinus rhythm - Personally Reviewed  ECG    No new tracings - Personally Reviewed  Physical Exam   GEN: Laying in bed under several blankest in no acute distress.   Neck: No JVD Cardiac: RRR, no murmurs, rubs, or gallops.  Respiratory: Clear to auscultation bilaterally. GI: Soft, nontender, non-distended  MS: No edema; No deformity. Neuro:  Nonfocal  Psych: Normal affect   Labs    High Sensitivity Troponin:   Recent Labs  Lab 03/30/21 1109  TROPONINIHS 12      Chemistry Recent Labs  Lab 03/30/21 0858 04/01/21 1538  NA 138 138  K 4.4 3.7  CL 108 106  CO2 25 26  GLUCOSE 112* 108*  BUN 20 15  CREATININE 1.03 1.00  CALCIUM 9.1 8.7*  PROT 7.5  --   ALBUMIN 4.2  --   AST 29  --   ALT 20  --   ALKPHOS  63  --   BILITOT 1.1  --   GFRNONAA >60 >60  ANIONGAP 5 6     Hematology Recent Labs  Lab 03/30/21 1616 03/30/21 2029 04/01/21 1538  WBC 7.2 7.0 6.0  RBC 4.62 4.87 4.39  HGB 13.3 13.6 12.4*  HCT 39.3 41.1 37.2*  MCV 85.1 84.4 84.7  MCH 28.8 27.9 28.2  MCHC 33.8 33.1 33.3  RDW 16.2* 16.1* 16.3*  PLT 182 196 195    BNPNo results for input(s): BNP, PROBNP in the last 168 hours.   DDimer No results for input(s): DDIMER in the last 168 hours.   Radiology    VAS [place] LOWER EXTREMITY VENOUS (DVT)  Result Date: 04/03/2021  Lower Venous DVT Study Patient Name:  ELVAN EBRON  Date of Exam:   04/03/2021 Medical Rec #: [id number]  Accession #:    [id number] Date of Birth: 03-Apr-1946  Patient Gender: M Patient Age:   75Y Exam Location:  [facility] Procedure:      VAS [place] LOWER EXTREMITY VENOUS (DVT) Referring Phys: [id number] PRAMOD S SETHI --------------------------------------------------------------------------------  Indications: Phlebitis/throbophlebitis.  Comparison Study: no prior Performing Technologist: [place] Ponder RVS  Examination Guidelines: A complete evaluation includes B-mode imaging, spectral Doppler, color Doppler, and power Doppler as needed of all accessible portions of each vessel. Bilateral testing is considered an integral part of a complete examination. Limited examinations for reoccurring indications may be performed as noted. The reflux portion of the exam is performed with the patient in reverse Trendelenburg.  +---------+---------------+---------+-----------+----------+--------------+ RIGHT    CompressibilityPhasicitySpontaneityPropertiesThrombus Aging +---------+---------------+---------+-----------+----------+--------------+ CFV      Full           Yes      Yes                                 +---------+---------------+---------+-----------+----------+--------------+ SFJ      Full                                                         +---------+---------------+---------+-----------+----------+--------------+ FV Prox  Full                                                        +---------+---------------+---------+-----------+----------+--------------+ FV Mid   Full                                                        +---------+---------------+---------+-----------+----------+--------------+ FV DistalFull                                                        +---------+---------------+---------+-----------+----------+--------------+ PFV      Full                                                        +---------+---------------+---------+-----------+----------+--------------+ POP      Full           Yes      Yes                                 +---------+---------------+---------+-----------+----------+--------------+ PTV      Full                                                        +---------+---------------+---------+-----------+----------+--------------+ PERO     Full                                                        +---------+---------------+---------+-----------+----------+--------------+   +---------+---------------+---------+-----------+----------+--------------+  LEFT     CompressibilityPhasicitySpontaneityPropertiesThrombus Aging +---------+---------------+---------+-----------+----------+--------------+ CFV      Full           Yes      Yes                                 +---------+---------------+---------+-----------+----------+--------------+ SFJ      Full                                                        +---------+---------------+---------+-----------+----------+--------------+ FV Prox  Full                                                        +---------+---------------+---------+-----------+----------+--------------+ FV Mid   Full                                                         +---------+---------------+---------+-----------+----------+--------------+ FV DistalFull                                                        +---------+---------------+---------+-----------+----------+--------------+ PFV      Full                                                        +---------+---------------+---------+-----------+----------+--------------+ POP      Full           Yes      Yes                                 +---------+---------------+---------+-----------+----------+--------------+ PTV      Full                                                        +---------+---------------+---------+-----------+----------+--------------+ PERO     Full                                                        +---------+---------------+---------+-----------+----------+--------------+     Summary: BILATERAL: - No evidence of deep vein thrombosis seen in the lower extremities, bilaterally. -   *See table(s) above for measurements and observations. Electronically signed by Deitra Mayo MD on 04/03/2021 at 5:02:33 PM.    Final  Cardiac Studies   Echocardiogram 03/31/21: 1. The aortic valve is tricuspid. Aortic valve regurgitation is moderate.  Aortic regurgitation PHT measures 436 msec.   2. Aortic dilatation noted. There is moderate dilatation of the ascending  aorta, measuring 48 mm.   3. Left ventricular ejection fraction, by estimation, is 60 to 65%. The  left ventricle has normal function. The left ventricle has no regional  wall motion abnormalities. There is mild concentric left ventricular  hypertrophy. Left ventricular diastolic  parameters are consistent with Grade I diastolic dysfunction (impaired  relaxation).   4. Right ventricular systolic function is normal. The right ventricular  size is normal. Tricuspid regurgitation signal is inadequate for assessing  PA pressure.   5. The mitral valve is normal in structure. Trivial mitral valve   regurgitation.   6. The inferior vena cava is normal in size with greater than 50%  respiratory variability, suggesting right atrial pressure of 3 mmHg.   Comparison(s): No prior Echocardiogram.  Patient Profile     75 y.o. male with a PMH of HTN, HLD, carotid artery stenosis, ascending aortic aneurysm, history of bilateral PE (2021), history of tobacco abuse, who presented with an acute stroke c/b hemorrhagic conversion following tPA administration, who is being followed by cardiology for the evaluation of new onset atrial fibrillation   Assessment & Plan    1. 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 No prior history of Afib. Echo this admission with EF 60-65%, G1DD, mild LVH, no RWMA, moderate AI, and moderate dilation of the ascending aorta (69mm). Had an ~2.5hr episode of Afib with RVR with rates up to 130s early 04/03/21 (first observed episode). This is likely the etiology of his initial stroke. He was started on metoprolol for rate control. Given hemorrhagic conversion, anticoagulation deferred at this time and he was started on aspirin 81mg  daily. Overall seems asymptomatic to Afib. TSH is wnl this AM. No recurrent episodes overnight. - CHA2DS2-VASc Score = 5 [CHF History: No, HTN History: Yes, Diabetes History: No, Stroke History: Yes, Vascular Disease History: Yes, Age Score: 1, Gender Score: 0].  Therefore, the patient's annual risk of stroke is 7.2 %.    - He will ultimately require anticoagulation once cleared by neurology pending ongoing recovery from his hemorrhagic stroke - recommend eliquis 5mg  BID to replace aspirin 81mg  daily. Will need to clarify requirements to be met prior to making this transition with neurology today.  - Continue metoprolol tartrate 12.5mg  BID for rate control - Could consider outpatient sleep study   2. HTN: BP intermittently elevated. Allowing  permissive HTN in the setting of acute CVA - Continue metoprolol for now   3. HLD: LDL 184 this admission. Started on atorvastatin 80mg  daily. Goal LDL <70 in light of CVA and coronary artery calcifications - Continue atorvastatin 80mg  - Will need repeat FLP/LFTs in 6-8 weeks   4. Acute CVA: presented with left hand weakness and facial drooping. Given tPA and unfortunately and conversion to hemorrhagic stroke with [facility] on subsequent imaging. - Continue management per neurology.   5. Coronary artery calcifications: noted to have moderate calcifications on CT scan in 2021. Had isolated episode of chest pressure 1 week ago which resolved spontaneously - Consider outpatient NST vs coronary CTA to r/o ischemia - Continue aggressive risk factor modifications to promote BP goal <130/80, LDL <70, A1C <7   6. Aortic regurgitation: moderate on echo - Continue routine outpatient monitoring  7. Ascending aortic aneurysm: 33mm on echo this admission - Continue routine outpatient monitoring  8. Fever: Tmax 100.7 04/03/21 with temps in the 72F range yesterday evening. He reports chills and fatigue. No complaints of cough/nasal congestion or urinary issues. WBC wnl today - Continue to monitor fever curve - Defer further work-up/management to primary team     For questions or updates, please contact [place] Please consult [email or web address] for contact info under        Signed, Abigail Butts, PA-C  04/04/2021, 9:53 AM

## 2021-04-04 NOTE — Progress Notes (Signed)
Occupational Therapy Treatment Patient Details Name: Geoffrey Wells Blood MRN: [id number] DOB: Jun 03, 1946 Today's Date: 04/04/2021    History of present illness Pt is a 75 y/o male who presents s/p L hand weakness and L facial droop. 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 Received reversal medication. PMHx: COVID PNA.   OT comments  Pt limited by L visual deficits- not scanning past midline. Pt requries cues to turn head to L for all functional tasks. Pt requires assist for ADL tasks as L incoordation and poor awareness of deficits on L side.  Pt fatigues veryquickly and feeling nauseaous today. Pt with difficulty comprehending to turn head to L for visual scanning/trailmaking task on paper. Pt continues to progress with skilled OT and would benefit from continued OT skilled services.   Follow Up Recommendations  CIR;Supervision/Assistance - 24 hour    Equipment Recommendations  3 in 1 bedside commode    Recommendations for Other Services      Precautions / Restrictions Precautions Precautions: Fall;Other (comment) Precaution Comments: L hemianopia, BP < 160 Restrictions Weight Bearing Restrictions: No       Mobility Bed Mobility Overal bed mobility: Needs Assistance Bed Mobility: Supine to Sit;Sit to Supine Rolling: Min guard   Supine to sit: Supervision     General bed mobility comments: use of rail for supine to sit; sit to supine    Transfers Overall transfer level: Needs assistance Equipment used: 1 person hand held assist Transfers: Sit to/from [facility] Sit to Stand: Min assist Stand pivot transfers: Min assist       General transfer comment: minA with RW, cues for safety    Balance Overall balance assessment: Needs assistance Sitting-balance support: Feet supported Sitting balance-Leahy Scale: Good     Standing  balance support: No upper extremity supported;During functional activity Standing balance-Leahy Scale: Fair Standing balance comment: handheld support                           ADL either performed or assessed with clinical judgement   ADL Overall ADL's : Needs assistance/impaired                                     Functional mobility during ADLs: Minimal assistance;+2 for physical assistance;+2 for safety/equipment;Cueing for safety;Cueing for sequencing General ADL Comments: Pt limited by L visual deficits- not scanning past midline. Pt requries cues to turn head to L for all functional tasks. Pt requires assist for ADL tasks as L incoordation and poor awareness of deficits on L side.     Vision   Vision Assessment?: Vision impaired- to be further tested in functional context;Yes Eye Alignment: Within Functional Limits Ocular Range of Motion: Restricted on the left;Impaired-to be further tested in functional context Alignment/Gaze Preference: Gaze right Tracking/Visual Pursuits: Left eye does not track laterally   Perception     Praxis      Cognition Arousal/Alertness: Awake/alert Behavior During Therapy: Flat affect Overall Cognitive Status: Impaired/Different from baseline Area of Impairment: Attention;Memory;Following commands;Safety/judgement;Awareness;Problem solving                   Current Attention Level: Selective Memory: Decreased short-term memory Following Commands: Follows one step commands with increased time;Follows one step commands consistently;Follows multi-step commands  inconsistently Safety/Judgement: Decreased awareness of deficits;Decreased awareness of safety Awareness: Emergent Problem Solving: Slow processing;Difficulty sequencing;Requires tactile cues General Comments: Pt fatigues veryquickly and feeling nauseaous today. Pt with difficulty comprehending to turn head to L for visual scanning/trailmaking task on  paper.        Exercises     Shoulder Instructions       General Comments Spouse is very supportive in room.    Pertinent Vitals/ Pain       Pain Assessment: Faces Faces Pain Scale: Hurts a little bit Pain Location: neck Pain Descriptors / Indicators: Discomfort;Sore Pain Intervention(s): Monitored during session  Home Living                                          Prior Functioning/Environment              Frequency  Min 2X/week        Progress Toward Goals  OT Goals(current goals can now be found in the care plan section)  Progress towards OT goals: Progressing toward goals  Acute Rehab OT Goals Patient Stated Goal: to go to rehab OT Goal Formulation: With patient/family Time For Goal Achievement: 04/14/21 Potential to Achieve Goals: Good ADL Goals Pt Will Perform Eating: with set-up;sitting Pt Will Perform Grooming: with min guard assist;with set-up;sitting Pt Will Transfer to Toilet: with min guard assist;ambulating;bedside commode Additional ADL Goal #1: Pt will attend to L side 25% of session with minimal cues to turn to L. Additional ADL Goal #2: Pt will follow multi-step commands 50% of the time in 3/4 trials.  Plan Discharge plan remains appropriate    Co-evaluation                 AM-PAC OT "6 Clicks" Daily Activity     Outcome Measure   Help from another person eating meals?: A Little Help from another person taking care of personal grooming?: A Little Help from another person toileting, which includes using toliet, bedpan, or urinal?: A Lot Help from another person bathing (including washing, rinsing, drying)?: A Lot Help from another person to put on and taking off regular upper body clothing?: A Little Help from another person to put on and taking off regular lower body clothing?: A Lot 6 Click Score: 15    End of Session Equipment Utilized During Treatment: Gait belt;Rolling walker  OT Visit Diagnosis:  Unsteadiness on feet (R26.81);Muscle weakness (generalized) (M62.81);Pain;Other symptoms and signs involving cognitive function Pain - part of body:  (nausea)   Activity Tolerance Patient tolerated treatment well   Patient Left in chair;with call bell/phone within reach;with chair alarm set;with family/visitor present;with nursing/sitter in room   Nurse Communication Mobility status        Time: 1050-1111 OT Time Calculation (min): 21 min  Charges: OT General Charges $OT Visit: 1 Visit OT Treatments $Neuromuscular Re-education: 8-22 mins  [name], OTR/L Acute Rehabilitation Services Pager: [phone] Office: [place] [facility] 04/04/2021, 1:44 PM

## 2021-04-05 DIAGNOSIS — I6310 Cerebral infarction due to embolism of unspecified precerebral artery: Secondary | ICD-10-CM | POA: Diagnosis not present

## 2021-04-05 LAB — CBC WITH DIFFERENTIAL/PLATELET
Abs Immature Granulocytes: 0.02 10*3/uL (ref 0.00–0.07)
Basophils Absolute: 0 10*3/uL (ref 0.0–0.1)
Basophils Relative: 0 %
Eosinophils Absolute: 0 10*3/uL (ref 0.0–0.5)
Eosinophils Relative: 0 %
HCT: 39 % (ref 39.0–52.0)
Hemoglobin: 12.9 g/dL — ABNORMAL LOW (ref 13.0–17.0)
Immature Granulocytes: 0 %
Lymphocytes Relative: 23 %
Lymphs Abs: 1.6 10*3/uL (ref 0.7–4.0)
MCH: 27.6 pg (ref 26.0–34.0)
MCHC: 33.1 g/dL (ref 30.0–36.0)
MCV: 83.3 fL (ref 80.0–100.0)
Monocytes Absolute: 1 10*3/uL (ref 0.1–1.0)
Monocytes Relative: 14 %
Neutro Abs: 4.3 10*3/uL (ref 1.7–7.7)
Neutrophils Relative %: 63 %
Platelets: 190 10*3/uL (ref 150–400)
RBC: 4.68 MIL/uL (ref 4.22–5.81)
RDW: 15.9 % — ABNORMAL HIGH (ref 11.5–15.5)
WBC: 6.9 10*3/uL (ref 4.0–10.5)
nRBC: 0 % (ref 0.0–0.2)

## 2021-04-05 LAB — COMPREHENSIVE METABOLIC PANEL
ALT: 16 U/L (ref 0–44)
AST: 23 U/L (ref 15–41)
Albumin: 3.6 g/dL (ref 3.5–5.0)
Alkaline Phosphatase: 61 U/L (ref 38–126)
Anion gap: 12 (ref 5–15)
BUN: 16 mg/dL (ref 8–23)
CO2: 23 mmol/L (ref 22–32)
Calcium: 9.4 mg/dL (ref 8.9–10.3)
Chloride: 96 mmol/L — ABNORMAL LOW (ref 98–111)
Creatinine, Ser: 1.18 mg/dL (ref 0.61–1.24)
GFR, Estimated: >60 mL/min (ref >60)
Glucose, Bld: 119 mg/dL — ABNORMAL HIGH (ref 70–99)
Potassium: 3.9 mmol/L (ref 3.5–5.1)
Sodium: 131 mmol/L — ABNORMAL LOW (ref 135–145)
Total Bilirubin: 1.4 mg/dL — ABNORMAL HIGH (ref 0.3–1.2)
Total Protein: 6.7 g/dL (ref 6.5–8.1)

## 2021-04-05 NOTE — Progress Notes (Signed)
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    fatigue and "foggy brain"   Pneumonia due to COVID-19 virus 01/10/2020   hospitalization X 2   Pulmonary emboli (HCC)    post Covid   Throat cancer [facility])    Radiotactic surgery   Past Surgical History:  Past Surgical History:  Procedure Laterality Date   HERNIA REPAIR     Social History:  reports that he has quit smoking. His smoking use included cigarettes. He has a 12.50 pack-year smoking history. He has never used smokeless tobacco. He reports current alcohol use of about 7.0 standard drinks of alcohol per week. He reports previous drug use.  Family / Support Systems Spouse/Significant Other: Beecher Mcardle Children: [name] (Son) Anticipated Caregiver: Beecher Mcardle Ability/Limitations of Caregiver: Min A (Family works, but will arrange 24/7) Caregiver Availability: 24/7 Family Dynamics: some support from spouse, son and other family  Social History Preferred language: English Religion: Christian Read: Yes Write: Yes Employment Status: Retired Public relations account executive Issues: n/a Guardian/Conservator: n/a   Abuse/Neglect Abuse/Neglect Assessment Can Be Completed: Yes Physical Abuse: Denies Verbal Abuse: Denies Sexual Abuse: Denies Exploitation of patient/patient's resources: Denies Self-Neglect: Denies  Emotional Status Pt's affect, behavior and adjustment status: Patient pleasant, spouse at bedside Recent Psychosocial Issues: n/a Psychiatric History: n/a Substance Abuse History: hx of smoking, alcohol  Patient / Family Perceptions, Expectations & Goals Pt/Family understanding of illness & functional limitations:  yes Premorbid pt/family roles/activities: Previosuly independent Anticipated changes in roles/activities/participation: Spouse able to assist with roles and task Pt/family expectations/goals: [place]: None Premorbid Home Care/DME Agencies: Other (Comment) ([employer], [employer], Conservation officer, nature) Transportation available at discharge: Spouse able to [employer] referrals recommended: Neuropsychology  Discharge Planning Living Arrangements: Spouse/significant other Support Systems: Spouse/significant other, Children Type of Residence: Private residence (1 level home, 5 steps) Insurance Resources: Multimedia programmer (specify) ([place]) Financial Screen Referred: No Living Expenses: Own Money Management: Spouse, Patient Does the patient have any problems obtaining your medications?: No Home Management: Independent Patient/Family Preliminary Plans: Spouse able to assist with money and medication management Care Coordinator Barriers to Discharge: Lack of/limited family support Care Coordinator Anticipated Follow Up Needs: HH/OP Expected length of stay: 7-10 Days  Clinical Impression SW met with pt and spouse at bedside, introduced self, explained role and addressed questions and concerns. Patient plans to d/c home back with family to provide 24/7. Spouse reports they still work, but family will make arrangements. No additional questions or concerns, sw will continue to follow up.   Dyanne Iha 04/05/2021, 12:20 PM

## 2021-04-05 NOTE — Progress Notes (Signed)
Inpatient [facility] Individual Statement of Services  Patient Name:  Geoffrey Wells  Date:  04/05/2021  Welcome to the [facility].  Our goal is to provide you with an individualized program based on your diagnosis and situation, designed to meet your specific needs.  With this comprehensive rehabilitation program, you will be expected to participate in at least 3 hours of rehabilitation therapies Monday-Friday, with modified therapy programming on the weekends.  Your rehabilitation program will include the following services:  Physical Therapy (PT), Occupational Therapy (OT), Speech Therapy (ST), 24 hour per day rehabilitation nursing, Therapeutic Recreaction (TR), Neuropsychology, Care Coordinator, Rehabilitation Medicine, Nutrition Services, Pharmacy Services, and Other  Weekly team conferences will be held on Wednesdays to discuss your progress.  Your Inpatient Rehabilitation Care Coordinator will talk with you frequently to get your input and to update you on team discussions.  Team conferences with you and your family in attendance may also be held.  Expected length of stay:  7-10 Days  Overall anticipated outcome:  Supervision  Depending on your progress and recovery, your program may change. Your Inpatient Rehabilitation Care Coordinator will coordinate services and will keep you informed of any changes. Your Inpatient Rehabilitation Care Coordinator's name and contact numbers are listed  below.  The following services may also be recommended but are not provided by the [facility]:   [facility] will be made to provide these services after discharge if needed.  Arrangements include referral to agencies that provide these services.  Your insurance has been verified to be:   [place] primary doctor is:  [facility]   Pertinent  information will be shared with your doctor and your insurance company.  Inpatient Rehabilitation Care Coordinator:  Erlene Quan, [place] or [phone]  Information discussed with and copy given to patient by: Dyanne Iha, 04/05/2021, 10:49 AM

## 2021-04-05 NOTE — Evaluation (Signed)
Physical Therapy Assessment and Plan  Patient Details  Name: Geoffrey Wells MRN: [id number] Date of Birth: 1945-09-19  PT Diagnosis: Abnormality of gait, Cognitive deficits, Hemiparesis dominant, Impaired cognition, Muscle weakness, and Pain in head and back Rehab Potential: Good ELOS: 12-14 days   Today's Date: 04/05/2021 PT Individual Time: 1104-1206 PT Individual Time Calculation (min): 62 min    Hospital Problem: Principal Problem:   Embolic stroke [facility])   Past Medical History:  Past Medical History:  Diagnosis Date   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    fatigue and "foggy brain"   Pneumonia due to COVID-19 virus 01/10/2020   hospitalization X 2   Pulmonary emboli (HCC)    post Covid   Throat cancer [facility])    Radiotactic surgery   Past Surgical History:  Past Surgical History:  Procedure Laterality Date   HERNIA REPAIR      Assessment & Plan Clinical Impression: Patient is a 75 y.o. year old  [place] male with history of throat cancer, ascending thoracic aorta aneurysm, Covid X 2, PE, chronic neck pain s/p recent manipulation who was admitted on 03/30/21 with weakness of left hand. CT head negative and CTA negative for dissection. He received tPA and follow CT head showed new acute IPH in posterior right temporal lobe, newly occluded right P3 PCA branch and small volume acute subarachnoid hemorrhage along right temporal and occipital lobes.  He received cryoprecipitate and TXA due to ongoing bleeding and transferred to [facility] for management. MRI brain done revealing multifocal infarcts involving R-MCA/PCA distributions and 4.1 X 3.2 cm IPH in right temporal lobe with scattered small volume SAH as well as acute infarct mesial left occipital lobe.   BLE dopplers were negative for DVT. 2 D echo showed EF showed moderate aortic regurgitation and EF 60-65%.  Hospital course significant for issues with headache and neck pain as well as new onset A. fib.  He  was evaluated by Dr. Gwenlyn Found who recommends initiation of [facility] once cleared by neurology. Patient with  resultant hemianopsia with left sided weakness, HA, impulsivity and poor safety awareness. ST evaluation revealed dysarthria with acute on chronic issues with cognitive impairments, dysphagia with mild to moderate vallecular residue and silent aspiration of thin liquids--on D3, nectar liquids.  CIR recommended due to functional decline. He currently complains of left hand weakness. Patient transferred to CIR on 04/04/2021 .   Patient currently requires min assist with mobility secondary to muscle weakness, decreased cardiorespiratoy endurance, decreased motor planning, decreased visual perceptual skills, decreased visual motor skills, field cut, and hemianopsia, decreased midline orientation and decreased attention to left, decreased attention, decreased awareness, decreased problem solving, decreased safety awareness, decreased memory, and delayed processing, and decreased sitting balance, decreased standing balance, decreased postural control, and decreased balance strategies.  Prior to hospitalization, patient was independent  with mobility and lived with Spouse [name]) in a House home.  Home access is 3-5 (front and back)Stairs to enter.  Patient will benefit from skilled PT intervention to maximize safe functional mobility, minimize fall risk, and decrease caregiver burden for planned discharge home with 24 hour supervision.  Anticipate patient will benefit from follow up OP at discharge.  PT - End of Session Activity Tolerance: Tolerates 30+ min activity with multiple rests Endurance Deficit: Yes Endurance Deficit Description: requires frequent seated rest breaks due to endurance deficits PT Assessment Rehab Potential (ACUTE/IP ONLY): Good PT Barriers to Discharge: Lack of/limited family support PT Patient demonstrates impairments  in the following area(s):  Balance;Safety;Behavior;Endurance;Motor;Nutrition;Pain;Perception;Skin Integrity PT Transfers Functional Problem(s): Bed Mobility;Bed to Chair;Car;Furniture;Floor PT Locomotion Functional Problem(s): Ambulation;Stairs PT Plan PT Intensity: Minimum of 1-2 x/day ,45 to 90 minutes PT Frequency: 5 out of 7 days PT Duration Estimated Length of Stay: 12-14 days PT Treatment/Interventions: Ambulation/gait training;Community reintegration;DME/adaptive equipment instruction;Neuromuscular re-education;Psychosocial support;Stair training;UE/LE Strength taining/ROM;Balance/vestibular training;Discharge planning;Functional electrical stimulation;Pain management;Skin care/wound management;Therapeutic Activities;UE/LE Coordination activities;Cognitive remediation/compensation;Disease management/prevention;Functional mobility training;Patient/family education;Splinting/orthotics;Visual/perceptual remediation/compensation;Therapeutic Exercise PT Transfers Anticipated Outcome(s): supervision using LRAD PT Locomotion Anticipated Outcome(s): supervision using LRAD PT Recommendation Recommendations for Other Services: Therapeutic Recreation consult Therapeutic Recreation Interventions: Outing/community reintergration Follow Up Recommendations: Outpatient PT;24 hour supervision/assistance Patient destination: Home Equipment Recommended: To be determined Equipment Details: has RW & SPC   PT Evaluation Precautions/Restrictions Precautions Precautions: Fall;Other (comment) Precaution Comments: L hemianopsia, SBP goal <140, decreased safety awareness Restrictions Weight Bearing Restrictions: No Pain Pain Assessment Pain Scale: Faces Pain Score: 4  Faces Pain Scale: Hurts little more Pain Type: Acute pain Pain Location: Head Pain Descriptors / Indicators: Aching;Headache Pain Onset: On-going (possibly worsens with activity) Pain Intervention(s): Medication (See eMAR);RN made aware;Rest;Relaxation During  stair navigation, specifically during descent, pt reported back pain but decreased with seated rest break. Home Living/Prior Functioning Home Living Living Arrangements: Spouse/significant other Available Help at Discharge: Family;Available PRN/intermittently Type of Home: House Home Access: Stairs to enter [employer] of Steps: 3-5 (front and back) Entrance Stairs-Rails:  (no HRs on back stairs, R HR on front stairs) Home Layout: One level Additional Comments: has RW and SPC from when he had COVID in 2021  Lives With: Spouse [name]) Prior Function Level of Independence: Independent with gait;Independent with transfers;Independent with homemaking with ambulation  Able to Take Stairs?: Yes Driving: Yes Vocation: Part time employment Comments: 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; reports days were variable with activity s/p COVID May 2021 due to [place] resulting in memory impairments, decreased energy, decreased endurance, and decreased strength Perception  Perception Perception: Impaired Inattention/Neglect: Does not attend to left visual field;Does not attend to left side of body Praxis Praxis: Impaired Praxis Impairment Details: Motor planning Praxis-Other Comments: tend to turn towards R associated with L inattention  Cognition Overall Cognitive Status: Impaired/Different from baseline Arousal/Alertness: Awake/alert Orientation Level: Oriented X4 Attention: Focused;Sustained Focused Attention: Appears intact Sustained Attention: Appears intact Memory: Impaired Awareness: Impaired Behaviors: Impulsive Safety/Judgment: Impaired Sensation Sensation Light Touch: Appears Intact (some inconsistent responses likely due to attention but sensation appears intact on scren) Hot/Cold: Not tested Proprioception: Appears Intact Stereognosis: Not tested Coordination Gross Motor Movements are Fluid and Coordinated: Yes Coordination and Movement  Description: GM movements are WFL in LEs Motor  Motor Motor: Hemiplegia Motor - Skilled Clinical Observations: mild L hemiparesis (UE>LE)   Trunk/Postural Assessment  Cervical Assessment Cervical Assessment: Exceptions to WFL (slight forward head) Thoracic Assessment Thoracic Assessment: Exceptions to [facility] (shoulder rounding) Lumbar Assessment Lumbar Assessment: Exceptions to [facility] (posterior pelvic tilt) Postural Control Postural Control: Deficits on evaluation  Balance Balance Balance Assessed: Yes Standardized Balance Assessment Standardized Balance Assessment: Timed Up and Go Test Timed Up and Go Test TUG: Normal TUG Normal TUG (seconds): 20 Static Sitting Balance Static Sitting - Balance Support: Feet supported Static Sitting - Level of Assistance: 5: Stand by assistance Dynamic Sitting Balance Dynamic Sitting - Balance Support: Feet supported Dynamic Sitting - Level of Assistance: Other (comment) (CGA) Static Standing Balance Static Standing - Balance Support: During functional activity Static Standing - Level of Assistance: 4: Min assist;Other (comment) (CGA)  Dynamic Standing Balance Dynamic Standing - Balance Support: During functional activity Dynamic Standing - Level of Assistance: 4: Min assist;3: Mod assist Extremity Assessment      RLE Assessment RLE Assessment: Within Functional Limits Active Range of Motion (AROM) Comments: WFL/WNL General Strength Comments: grossly 5/5 assessed in supine LLE Assessment LLE Assessment: Exceptions to [facility] LLE Strength Left Hip Flexion: 4+/5 Left Knee Flexion: 4+/5 Left Knee Extension: 4+/5 Left Ankle Dorsiflexion: 4/5 (some guarding due to 5th toe discomfort (hx of breaking that toe years ago, no current signs of injury)) Left Ankle Plantar Flexion: 4/5 (some guarding due to 5th toe discomfort (hx of breaking that toe years ago, no current signs of injury))  Care Tool Care Tool Bed Mobility Roll left and right activity    Roll left and right assist level: Minimal Assistance - Patient > 75%    Sit to lying activity   Sit to lying assist level: Minimal Assistance - Patient > 75%    Lying to sitting edge of bed activity   Lying to sitting edge of bed assist level: Minimal Assistance - Patient > 75%     Care Tool Transfers Sit to stand transfer   Sit to stand assist level: Minimal Assistance - Patient > 75%    Chair/bed transfer   Chair/bed transfer assist level: Minimal Assistance - Patient > 75%     Psychologist, counselling transfer activity did not occur: Environmental limitations        Care Tool Locomotion Ambulation   Assist level: Minimal Assistance - Patient > 75% Assistive device: No Device Max distance: 46f  Walk 10 feet activity   Assist level: Minimal Assistance - Patient > 75% Assistive device: No Device   Walk 50 feet with 2 turns activity   Assist level: Minimal Assistance - Patient > 75% Assistive device: No Device  Walk 150 feet activity Walk 150 feet activity did not occur: Safety/medical concerns (endurance)      Walk 10 feet on uneven surfaces activity Walk 10 feet on uneven surfaces activity did not occur:  (environmental - not accessible)      Stairs   Assist level: Minimal Assistance - Patient > 75% Stairs assistive device: 1 hand rail Max number of stairs: 4  Walk up/down 1 step activity   Walk up/down 1 step (curb) assist level: Minimal Assistance - Patient > 75% Walk up/down 1 step or curb assistive device: 1 hand rail    Walk up/down 4 steps activity Walk up/down 4 steps assist level: Minimal Assistance - Patient > 75% Walk up/down 4 steps assistive device: 1 hand rail  Walk up/down 12 steps activity Walk up/down 12 steps activity did not occur: Safety/medical concerns      Pick up small objects from floor   Pick up small object from the floor assist level: Moderate Assistance - Patient 50 - 74%    Wheelchair Will patient use wheelchair  at discharge?: No          Wheel 50 feet with 2 turns activity      Wheel 150 feet activity        Refer to Care Plan for Long Term Goals  SHORT TERM GOAL WEEK 1 PT Short Term Goal 1 (Week 1): Pt will perform supine<>sit with CGA PT Short Term Goal 2 (Week 1): Pt will perform sit<>stands using LRAD with CGA PT Short Term Goal 3 (Week 1): Pt will perform bed<>chair transfers using  LRAD with CGA PT Short Term Goal 4 (Week 1): Pt will ambulate at least 164f using LRAD with CGA PT Short Term Goal 5 (Week 1): Pt will ascend/descend 4 steps using R HR with CGA  Recommendations for other services: Therapeutic Recreation  Outing/community reintegration  Skilled Therapeutic Intervention Pt received supine in bed with his wife, MStanton Kidney present and pt agreeable to therapy session. Evaluation completed (see details above) with patient education regarding purpose of PT evaluation, PT POC and goals, therapy schedule, weekly team meetings, and other CIR information including safety plan and fall risk safety. Pt completed the below mobility tasks with the specified levels of assistance. Pt demos L inattention with decreased visual scanning in that direction and tendency to turn R despite most appropriate motor plan to be turning L. Pt demonstrates significant endurance impairments requiring frequent seated rest breaks and often leaning forward in chair resting elbows on knees. During gait training pt reports he needs to sit after ~614fbut is able to ambulate an additional 1036fo get safely to w/c. At end of session assisted pt stand pivot to recliner min assist and educated pt/wife on importance of increasing upright, OOB activity tolerance. Pt left seated in recliner with needs in reach and seat belt alarm on.   Mobility Bed Mobility Bed Mobility: Sit to Supine;Supine to Sit Supine to Sit: Minimal Assistance - Patient > 75% (via logroll technique due to back pain) Sit to Supine: Minimal Assistance -  Patient > 75% Transfers Transfers: Sit to Stand;Stand to Sit;Stand Pivot Transfers Sit to Stand: Minimal Assistance - Patient > 75% Stand to Sit: Minimal Assistance - Patient > 75% Stand Pivot Transfers: Minimal Assistance - Patient > 75% Stand Pivot Transfer Details: Tactile cues for sequencing;Tactile cues for posture;Verbal cues for technique;Verbal cues for sequencing;Verbal cues for gait pattern;Tactile cues for weight shifting;Tactile cues for initiation Transfer (Assistive device): None Locomotion  Gait Ambulation: Yes Gait Assistance: Minimal Assistance - Patient > 75% Gait Distance (Feet): 70 Feet Assistive device: None Gait Assistance Details: Tactile cues for weight shifting;Verbal cues for sequencing;Verbal cues for gait pattern;Tactile cues for posture;Tactile cues for sequencing;Verbal cues for technique Gait Gait: Yes Gait Pattern: Impaired Gait Pattern: Step-through pattern;Decreased step length - right;Decreased step length - left;Decreased trunk rotation Gait velocity: decreased Stairs / Additional Locomotion Stairs: Yes Stairs Assistance: Minimal Assistance - Patient > 75% Stair Management Technique: One rail Right Number of Stairs: 4 Height of Stairs: 6 Wheelchair Mobility Wheelchair Mobility: No   Discharge Criteria: Patient will be discharged from PT if patient refuses treatment 3 consecutive times without medical reason, if treatment goals not met, if there is a change in medical status, if patient makes no progress towards goals or if patient is discharged from hospital.  The above assessment, treatment plan, treatment alternatives and goals were discussed and mutually agreed upon: by patient and by family  CarTawana ScalePT, DPT, NCS, CSRS 04/05/2021, 7:56 AM

## 2021-04-05 NOTE — Plan of Care (Signed)
  Problem: RH Swallowing Goal: LTG Patient will consume least restrictive diet using compensatory strategies with assistance (SLP) Description: LTG:  Patient will consume least restrictive diet using compensatory strategies with assistance (SLP) Flowsheets (Taken 04/05/2021 1745) LTG: Pt Patient will consume least restrictive diet using compensatory strategies with assistance of (SLP): Modified Independent Goal: LTG Patient will participate in dysphagia therapy to increase swallow function with assistance (SLP) Description: LTG:  Patient will participate in dysphagia therapy to increase swallow function with assistance (SLP) Flowsheets (Taken 04/05/2021 1745) LTG: Pt will participate in dysphagia therapy to increase swallow function with assistance of (SLP): Supervision   Problem: RH Expression Communication Goal: LTG Patient will increase speech intelligibility (SLP) Description: LTG: Patient will increase speech intelligibility at word/phrase/conversation level with cues, % of the time (SLP) Flowsheets (Taken 04/05/2021 1745) LTG: Patient will increase speech intelligibility (SLP): Modified Independent   Problem: RH Problem Solving Goal: LTG Patient will demonstrate problem solving for (SLP) Description: LTG:  Patient will demonstrate problem solving for basic/complex daily situations with cues  (SLP) Flowsheets (Taken 04/05/2021 1745) LTG: Patient will demonstrate problem solving for (SLP): Basic daily situations LTG Patient will demonstrate problem solving for: Minimal Assistance - Patient > 75%   Problem: RH Memory Goal: LTG Patient will demonstrate ability for day to day (SLP) Description: LTG:   Patient will demonstrate ability for day to day recall/carryover during cognitive/linguistic activities with assist  (SLP) Flowsheets (Taken 04/05/2021 1745) LTG: Patient will demonstrate ability for day to day recall:  New information  Biographical information LTG: Patient will demonstrate ability  for day to day recall/carryover during cognitive/linguistic activities with assist (SLP): Minimal Assistance - Patient > 75% Goal: LTG Patient will use memory compensatory aids to (SLP) Description: LTG:  Patient will use memory compensatory aids to recall biographical/new, daily complex information with cues (SLP) Flowsheets (Taken 04/05/2021 1745) LTG: Patient will use memory compensatory aids to (SLP): Supervision   Problem: RH Attention Goal: LTG Patient will demonstrate this level of attention during functional activites (SLP) Description: LTG:  Patient will will demonstrate this level of attention during functional activites (SLP) Flowsheets (Taken 04/05/2021 1745) Patient will demonstrate during cognitive/linguistic activities the attention type of: Selective LTG: Patient will demonstrate this level of attention during cognitive/linguistic activities with assistance of (SLP): Supervision

## 2021-04-05 NOTE — Progress Notes (Signed)
Inpatient Rehabilitation  Patient information reviewed and entered into eRehab system by [name], OTR/L.   Information including medical coding, functional ability and quality indicators will be reviewed and updated through discharge.    

## 2021-04-05 NOTE — Evaluation (Signed)
Occupational Therapy Assessment and Plan  Patient Details  Name: Geoffrey Wells MRN: [id number] Date of Birth: August 27, 1946  OT Diagnosis: apraxia, cognitive deficits, disturbance of vision, and hemiplegia affecting dominant side Rehab Potential: Rehab Potential (ACUTE ONLY): Good ELOS: 12-14 days   Today's Date: 04/05/2021 OT Individual Time: 1410-1505 OT Individual Time Calculation (min): 55 min     Hospital Problem: Principal Problem:   Embolic stroke [facility])   Past Medical History:  Past Medical History:  Diagnosis Date   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    fatigue and "foggy brain"   Pneumonia due to COVID-19 virus 01/10/2020   hospitalization X 2   Pulmonary emboli (HCC)    post Covid   Throat cancer ([place])    Radiotactic surgery   Past Surgical History:  Past Surgical History:  Procedure Laterality Date   HERNIA REPAIR      Assessment & Plan Clinical Impression: Geoffrey Wells is a 75 year old LH male with history of throat cancer, ascending thoracic aorta aneurysm, Covid X 2, PE, chronic neck pain s/p recent manipulation who was admitted on 03/30/21 with weakness of left hand. CT head negative and CTA negative for dissection. He received tPA and follow CT head showed new acute IPH in posterior right temporal lobe, newly occluded right P3 PCA branch and small volume acute subarachnoid hemorrhage along right temporal and occipital lobes.  He received cryoprecipitate and TXA due to ongoing bleeding and transferred to [facility] for management. MRI brain done revealing multifocal infarcts involving R-MCA/PCA distributions and 4.1 X 3.2 cm IPH in right temporal lobe with scattered small volume SAH as well as acute infarct mesial left occipital lobe.   BLE dopplers were negative for DVT. 2 D echo showed EF showed moderate aortic regurgitation and EF 60-65%.  Hospital course significant for issues with headache and neck pain as well as new onset A. fib.  He was  evaluated by Dr. Gwenlyn Found who recommends initiation of [facility] once cleared by neurology. Patient with  resultant hemianopsia with left sided weakness, HA, impulsivity and poor safety awareness. ST evaluation revealed dysarthria with acute on chronic issues with cognitive impairments, dysphagia with mild to moderate vallecular residue and silent aspiration of thin liquids--on D3, nectar liquids..  Patient transferred to CIR on 04/04/2021 .    Patient currently requires min to mod with basic self-care skills secondary to muscle weakness, decreased cardiorespiratoy endurance, motor apraxia, decreased coordination, and decreased motor planning, decreased visual perceptual skills, decreased visual motor skills, field cut, and hemianopsia, decreased attention to left, left side neglect, and decreased motor planning, decreased initiation, decreased attention, decreased awareness, decreased problem solving, decreased safety awareness, decreased memory, and delayed processing, and decreased sitting balance, decreased standing balance, decreased postural control, hemiplegia, and decreased balance strategies.  Prior to hospitalization, patient could complete ADLs with independent .  Patient will benefit from skilled intervention to increase independence with basic self-care skills prior to discharge home with care partner.  Anticipate patient will require 24 hour supervision and follow up outpatient.  OT - End of Session Activity Tolerance: Tolerates 10 - 20 min activity with multiple rests Endurance Deficit: Yes Endurance Deficit Description: very fatigued requiring frequent seated rest breaks and requesting back to bed after brushing teeth at sink secondary to fatigue. OT Assessment Rehab Potential (ACUTE ONLY): Good OT Patient demonstrates impairments in the following area(s): Balance;Perception;Behavior;Safety;Cognition;Endurance;Vision;Motor OT Basic ADL's Functional Problem(s):  Grooming;Bathing;Dressing;Toileting;Eating OT Transfers Functional Problem(s): Toilet;Tub/Shower OT Additional  Impairment(s): Fuctional Use of Upper Extremity OT Plan OT Intensity: Minimum of 1-2 x/day, 45 to 90 minutes OT Frequency: Total of 15 hours over 7 days of combined therapies OT Duration/Estimated Length of Stay: 12-14 days OT Treatment/Interventions: Balance/vestibular training;Discharge planning;Pain management;Self Care/advanced ADL retraining;Therapeutic Activities;UE/LE Coordination activities;Cognitive remediation/compensation;Disease mangement/prevention;Functional mobility training;Patient/family education;Therapeutic Exercise;Visual/perceptual remediation/compensation;DME/adaptive equipment instruction;Neuromuscular re-education;Psychosocial support;Splinting/orthotics;UE/LE Strength taining/ROM OT Self Feeding Anticipated Outcome(s): supervision OT Basic Self-Care Anticipated Outcome(s): supervision OT Toileting Anticipated Outcome(s): supervision OT Bathroom Transfers Anticipated Outcome(s): supervision OT Recommendation Patient destination: Home Follow Up Recommendations: 24 hour supervision/assistance;Outpatient OT Equipment Recommended: To be determined   OT Evaluation Precautions/Restrictions  Precautions Precautions: Fall;Other (comment) Precaution Comments: L hemianopsia, SBP goal <140, decreased safety awareness Restrictions Weight Bearing Restrictions: No General Chart Reviewed: Yes Pain Pain Assessment Pain Scale: 0-10 Pain Score: 0-No pain Home Living/Prior Functioning Home Living Living Arrangements: Spouse/significant other Available Help at Discharge: Family, Available PRN/intermittently (Simultaneous filing. User may not have seen previous data.) Type of Home: House (Simultaneous filing. User may not have seen previous data.) Home Access: Stairs to enter Entrance Stairs-Number of Steps: 3-5 Entrance Stairs-Rails:  (no HRs on back stairs, R HR on  front stairs) Home Layout: One level Bathroom Shower/Tub: Chiropodist: Standard Additional Comments: has RW and SPC from when he had COVID in 2021  Lives With: Spouse (Simultaneous filing. User may not have seen previous data.) Prior Function Level of Independence: Independent with gait, Independent with transfers, Independent with homemaking with ambulation, Independent with basic ADLs  Able to Take Stairs?: Yes Driving: Yes Vocation: Part time employment (Simultaneous filing. 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; reports days were variable with activity s/p COVID May 2021 due to [place] resulting in memory impairments, decreased energy, decreased endurance, and decreased strength Vision Baseline Vision/History: Wears glasses Wears Glasses: Distance only;At all times Patient Visual Report: Peripheral vision impairment Vision Assessment?: Yes Eye Alignment: Within Functional Limits Ocular Range of Motion: Restricted looking down Alignment/Gaze Preference: Gaze right Tracking/Visual Pursuits: Decreased smoothness of horizontal tracking;Requires cues, head turns, or add eye shifts to track Saccades: Additional eye shifts occurred during testing;Impaired - to be further tested in functional context Convergence: Impaired (comment) (absent) Visual Fields: Left visual field deficit Perception  Perception: Impaired Inattention/Neglect: Does not attend to left side of body;Does not attend to left visual field Praxis Praxis: Impaired Praxis Impairment Details: Motor planning Praxis-Other Comments: tend to turn towards R associated with L inattention Cognition Overall Cognitive Status: Impaired/Different from baseline Arousal/Alertness: Awake/alert Orientation Level: Person;Place;Situation Person: Oriented Place: Oriented Situation: Oriented Year: 2022 Month: August Day of Week: Correct Memory:  Impaired Memory Impairment: Storage deficit;Retrieval deficit;Decreased recall of new information Immediate Memory Recall: Sock;Blue;Bed Memory Recall Sock: Without Cue Memory Recall Blue: Without Cue Memory Recall Bed: With Cue Attention: Sustained;Focused;Alternating Focused Attention: Appears intact Sustained Attention: Impaired Sustained Attention Impairment: Functional basic;Verbal basic Awareness: Impaired Awareness Impairment: Emergent impairment Problem Solving: Impaired Problem Solving Impairment: Functional basic Executive Function: Organizing;Sequencing;Reasoning Reasoning: Impaired Sequencing: Impaired Organizing: Impaired Behaviors: Impulsive Safety/Judgment: Impaired Sensation Sensation Light Touch: Appears Intact Hot/Cold: Appears Intact Proprioception: Impaired Detail Proprioception Impaired Details: Impaired LUE Stereognosis: Not tested Coordination Gross Motor Movements are Fluid and Coordinated: Yes Fine Motor Movements are Fluid and Coordinated: No Finger Nose Finger Test: impaired LUE; WNL RUE Motor  Motor Motor: Hemiplegia Motor - Skilled Clinical Observations: mild L hemiparesis (UE>LE)  Trunk/Postural Assessment  Cervical Assessment Cervical Assessment: Within Functional Limits  Thoracic Assessment Thoracic Assessment: Within Functional Limits Lumbar Assessment Lumbar Assessment: Within Functional Limits Postural Control Postural Control: Deficits on evaluation (delayed protective response)  Balance Balance Balance Assessed: Yes Static Sitting Balance Static Sitting - Balance Support: Feet supported;No upper extremity supported Static Sitting - Level of Assistance: 5: Stand by assistance Dynamic Sitting Balance Dynamic Sitting - Balance Support: Feet supported Dynamic Sitting - Level of Assistance: 5: Stand by assistance Static Standing Balance Static Standing - Balance Support: During functional activity Static Standing - Level of  Assistance: 4: Min assist;Other (comment) Dynamic Standing Balance Dynamic Standing - Balance Support: During functional activity Dynamic Standing - Level of Assistance: 4: Min assist;3: Mod assist Extremity/Trunk Assessment RUE Assessment RUE Assessment: Within Functional Limits LUE Assessment LUE Assessment: Exceptions to [facility] Passive Range of Motion (PROM) Comments: WNL Active Range of Motion (AROM) Comments: WFL General Strength Comments: shoulder and elbow 4+/5; grip 3+/5  Care Tool Care Tool Self Care Eating   Eating Assist Level: Supervision/Verbal cueing    Oral Care    Oral Care Assist Level: Minimal Assistance - Patient > 75%    Bathing         Assist Level: Maximal Assistance - Patient 24 - 49%    Upper Body Dressing(including orthotics)       Assist Level: Minimal Assistance - Patient > 75%    Lower Body Dressing (excluding footwear)     Assist for lower body dressing: Moderate Assistance - Patient 50 - 74%    Putting on/Taking off footwear     Assist for footwear: Moderate Assistance - Patient 50 - 74%       Care Tool Toileting Toileting activity Toileting Activity did not occur (Clothing management and hygiene only): N/A (no void or bm) Assist for toileting:  (no void)     Care Tool Bed Mobility Roll left and right activity        Sit to lying activity        Lying to sitting edge of bed activity         Care Tool Transfers Sit to stand transfer        Chair/bed transfer         Toilet transfer   Assist Level: Minimal Assistance - Patient > 75%     Care Tool Cognition Expression of Ideas and Wants Expression of Ideas and Wants: Some difficulty - exhibits some difficulty with expressing needs and ideas (e.g, some words or finishing thoughts) or speech is not clear   Understanding Verbal and Non-Verbal Content Understanding Verbal and Non-Verbal Content: Usually understands - understands most conversations, but misses some part/intent  of message. Requires cues at times to understand   Memory/Recall Ability *first 3 days only Memory/Recall Ability *first 3 days only: Current season;That he or she is in a hospital/hospital unit    Refer to Care Plan for [place] OT Short Term Goal 1 (Week 1): Pt will complete LB dressing with CGA. OT Short Term Goal 2 (Week 1): Pt will tolerate bathing UB and LB sitting/standing at shower level with min assist. OT Short Term Goal 3 (Week 1): Pt will toilet and complete toilet transfer with CGA. OT Short Term Goal 4 (Week 1): Pt will brush teeth standing sinkside with close supervision.  Recommendations for other services: None    Skilled Therapeutic Intervention ADL ADL Grooming: Minimal assistance Where Assessed-Grooming: Sitting at sink Mobility  Bed Mobility Supine to Sit: Minimal Assistance -  Patient > 75% Sit to Supine: Minimal Assistance - Patient > 75% Transfers Sit to Stand: Minimal Assistance - Patient > 75% Stand to Sit: Minimal Assistance - Patient > 75%  Skilled treatment:  Pt semi upright in bed, no c/o pain, just finishing up with SPT. Pt reports feeling very fatigued from previous therapy sessions.  Wife at bedside, pleasant and encouraging to pt throughout session.  Initial evaluation completed per above.  Collaborated with pt and wife regarding goals and OT POC.  Vitals assessed sitting EOB after supine to sit with min assist: BP 111/72, pulse 74, SPO2 on RA 97%.  Pt requesting to brush his teeth. Pt ambulated from EOB to sinkside without AD with min assist due to significant left visual field cut needing max Vcs to implement head turn to left and safe approach to sink.  Pt completed oral hygiene with min assist and hand over hand on left for bilateral extremity use while opening toothpaste, applying to toothbrush, and opening mouth wash.  Pt utilized right non dominant hand with effort to brush teeth due to incoordination and inattention  to left hand.  Pt dropping his head frequently and leaning trunk on sink counter stating, Im so tired I really want to get back in bed.  Pts wife encouraged him to attempt toileting while therapist present however despite this pt insisting fatigue levels high and needing to get back in bed.  Stand pivot to EOB and sit to supine completed with min assist.  Call bell in reach, bed alarm on at end of session.   Discharge Criteria: Patient will be discharged from OT if patient refuses treatment 3 consecutive times without medical reason, if treatment goals not met, if there is a change in medical status, if patient makes no progress towards goals or if patient is discharged from hospital.  The above assessment, treatment plan, treatment alternatives and goals were discussed and mutually agreed upon: by patient and by family  ROPER TOLSON 04/05/2021, 5:40 PM

## 2021-04-05 NOTE — Progress Notes (Signed)
PROGRESS NOTE   Subjective/Complaints:  No issues overnite Feels fatigued, hx of "long covid" but also PCA infarct may do the same   ROS- neg CP, SOB, N/V/D  Objective:   VAS [place] LOWER EXTREMITY VENOUS (DVT)  Result Date: 04/03/2021  Lower Venous DVT Study Patient Name:  Geoffrey Wells  Date of Exam:   04/03/2021 Medical Rec #: [id number]  Accession #:    [id number] Date of Birth: 1945-10-28  Patient Gender: M Patient Age:   75Y Exam [facility]  [facility] Procedure:      VAS [place] LOWER EXTREMITY VENOUS (DVT) Referring Phys: [place] --------------------------------------------------------------------------------  Indications: Phlebitis/throbophlebitis.  Comparison Study: no prior Performing Technologist: Archie Patten RVS  Examination Guidelines: A complete evaluation includes B-mode imaging, spectral Doppler, color Doppler, and power Doppler as needed of all accessible portions of each vessel. Bilateral testing is considered an integral part of a complete examination. Limited examinations for reoccurring indications may be performed as noted. The reflux portion of the exam is performed with the patient in reverse Trendelenburg.  +---------+---------------+---------+-----------+----------+--------------+ RIGHT    CompressibilityPhasicitySpontaneityPropertiesThrombus Aging +---------+---------------+---------+-----------+----------+--------------+ CFV      Full           Yes      Yes                                 +---------+---------------+---------+-----------+----------+--------------+ SFJ      Full                                                        +---------+---------------+---------+-----------+----------+--------------+ FV Prox  Full                                                        +---------+---------------+---------+-----------+----------+--------------+ FV Mid   Full                                                         +---------+---------------+---------+-----------+----------+--------------+ FV DistalFull                                                        +---------+---------------+---------+-----------+----------+--------------+ PFV      Full                                                        +---------+---------------+---------+-----------+----------+--------------+  POP      Full           Yes      Yes                                 +---------+---------------+---------+-----------+----------+--------------+ PTV      Full                                                        +---------+---------------+---------+-----------+----------+--------------+ PERO     Full                                                        +---------+---------------+---------+-----------+----------+--------------+   +---------+---------------+---------+-----------+----------+--------------+ LEFT     CompressibilityPhasicitySpontaneityPropertiesThrombus Aging +---------+---------------+---------+-----------+----------+--------------+ CFV      Full           Yes      Yes                                 +---------+---------------+---------+-----------+----------+--------------+ SFJ      Full                                                        +---------+---------------+---------+-----------+----------+--------------+ FV Prox  Full                                                        +---------+---------------+---------+-----------+----------+--------------+ FV Mid   Full                                                        +---------+---------------+---------+-----------+----------+--------------+ FV DistalFull                                                        +---------+---------------+---------+-----------+----------+--------------+ PFV      Full                                                         +---------+---------------+---------+-----------+----------+--------------+ POP      Full           Yes      Yes                                 +---------+---------------+---------+-----------+----------+--------------+  PTV      Full                                                        +---------+---------------+---------+-----------+----------+--------------+ PERO     Full                                                        +---------+---------------+---------+-----------+----------+--------------+     Summary: BILATERAL: - No evidence of deep vein thrombosis seen in the lower extremities, bilaterally. -   *See table(s) above for measurements and observations. Electronically signed by Deitra Mayo MD on 04/03/2021 at 5:02:33 PM.    Final    Recent Labs    04/05/21 0502  WBC 6.9  HGB 12.9*  HCT 39.0  PLT 190   Recent Labs    04/05/21 0502  NA 131*  K 3.9  CL 96*  CO2 23  GLUCOSE 119*  BUN 16  CREATININE 1.18  CALCIUM 9.4    Intake/Output Summary (Last 24 hours) at 04/05/2021 0801 Last data filed at 04/05/2021 0741 Gross per 24 hour  Intake 118 ml  Output --  Net 118 ml        Physical Exam: Vital Signs Blood pressure (!) 155/76, pulse 84, temperature 98.4 F (36.9 C), resp. rate 18, height '6\' 1"'$  (1.854 m), weight 94.5 kg, SpO2 94 %.  General: No acute distress Mood and affect are appropriate Heart: Regular rate and rhythm no rubs murmurs or extra sounds Lungs: Clear to auscultation, breathing unlabored, no rales or wheezes Abdomen: Positive bowel sounds, soft nontender to palpation, nondistended Extremities: No clubbing, cyanosis, or edema Skin: No evidence of breakdown, no evidence of rash Neurologic: Cranial nerves II through XII intact, motor strength is 5/5 in RIght and 4/5 left deltoid, bicep, tricep, grip, hip flexor, knee extensors, ankle dorsiflexor and plantar flexor Sensory exam normal sensation to light touch and proprioception in  bilateral upper and lower extremities Cerebellar exam normal finger to nose to finger as well as heel to shin in bilateral upper and lower extremities Musculoskeletal: Full range of motion in all 4 extremities. No joint swelling    Assessment/Plan: 1. Functional deficits which require 3+ hours per day of interdisciplinary therapy in a comprehensive inpatient rehab setting. Physiatrist is providing close team supervision and 24 hour management of active medical problems listed below. Physiatrist and rehab team continue to assess barriers to discharge/monitor patient progress toward functional and medical goals  Care Tool:  Bathing              Bathing assist       Upper Body Dressing/Undressing Upper body dressing        Upper body assist Assist Level: Minimal Assistance - Patient > 75%    Lower Body Dressing/Undressing Lower body dressing            Lower body assist       Toileting Toileting    Toileting assist Assist for toileting: Minimal Assistance - Patient > 75%     Transfers Chair/bed transfer  Transfers assist           Locomotion Ambulation   Ambulation assist  Walk 10 feet activity   Assist           Walk 50 feet activity   Assist           Walk 150 feet activity   Assist           Walk 10 feet on uneven surface  activity   Assist           Wheelchair     Assist               Wheelchair 50 feet with 2 turns activity    Assist            Wheelchair 150 feet activity     Assist          Blood pressure (!) 155/76, pulse 84, temperature 98.4 F (36.9 C), resp. rate 18, height '6\' 1"'$  (1.854 m), weight 94.5 kg, SpO2 94 %.  Medical Problem List and Plan: 1.  Embolic right MCA/PCA infarct             -patient may shower             -ELOS/Goals: 7-10 days S 2.  Antithrombotics: -DVT/anticoagulation:  Mechanical: Sequential compression devices, below knee  Bilateral lower extremities. Dopplers negative at admission--monitor for now.             -antiplatelet therapy: ASA 3. Headaches:  tylenol prn. Start topamax '25mg'$  HS 4. Mood: [name] to follow for evaluation and support.             -antipsychotic agents: N/A 5. Neuropsych: This patient may be intermittenly capable of making decisions on his own behalf. 6. Skin/Wound Care: Routine pressure relief measures. 7. Fluids/Electrolytes/Nutrition: Monitor I/O. Check lytes in am., mild hyponatremia, Kidney fxn ok 8. HTN: Monitor BP TID--on metoprolol and Lisinopril              Vitals:   04/05/21 0400 04/05/21 0511  BP: (!) 148/78 (!) 155/76  Pulse: 90 84  Resp: 18 18  Temp: 98 F (36.7 C) 98.4 F (36.9 C)  SpO2:  94%  May adjust   9. New onset A fib: Monitor HR tid--continue metoprolol, mild tachy cont to monitor prior to BB dose adjustment              --On ASA with recs 10. Headaches/neck pain/Myalgias: Ice for neck pain. Low dose topamax for HA. --amantadine for activation.   11. Pre-diabetes: Hgb A1c- 5.8. Educate on CM diet. 12. Dyslipidemia:  Now on Lipitor for LDL 184. 13. Decreased initiation: start amantadine. 14. 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     LOS: 1 days A FACE TO FACE EVALUATION WAS PERFORMED  Charlett Blake 04/05/2021, 8:01 AM

## 2021-04-05 NOTE — Evaluation (Addendum)
Speech Language Pathology Assessment and Plan  Patient Details  Name: Geoffrey Wells MRN: [id number] Date of Birth: [birth year]  SLP Diagnosis: Dysarthria;Dysphagia;Speech and Language deficits;Cognitive Impairments  Rehab Potential: Good ELOS: 12-14    Today's Date: 04/05/2021 SLP Individual Time: 1324-4010 SLP Individual Time Calculation (min): 38 min   Hospital Problem: Principal Problem:   Embolic stroke [facility])  Past Medical History:  Past Medical History:  Diagnosis Date   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    fatigue and "foggy brain"   Pneumonia due to COVID-19 virus 01/10/2020   hospitalization X 2   Pulmonary emboli (HCC)    post Covid   Throat cancer ([place])    Radiotactic surgery   Past Surgical History:  Past Surgical History:  Procedure Laterality Date   HERNIA REPAIR      Assessment / Plan / Recommendation Clinical Impression Geoffrey Wells is a 75 year old LH male with history of throat cancer, ascending thoracic aorta aneurysm, Covid X 2, PE, chronic neck pain s/p recent manipulation who was admitted on 03/30/21 with weakness of left hand. CT head negative and CTA negative for dissection. He received tPA and follow CT head showed new acute IPH in posterior right temporal lobe, newly occluded right P3 PCA branch and small volume acute subarachnoid hemorrhage along right temporal and occipital lobes.  He received cryoprecipitate and TXA due to ongoing bleeding and transferred to [facility] for management. MRI brain done revealing multifocal infarcts involving R-MCA/PCA distributions and 4.1 X 3.2 cm IPH in right temporal lobe with scattered small volume SAH as well as acute infarct mesial left occipital lobe. Hospital course significant for issues with headache and neck pain as well as new onset A. fib.  He was evaluated by Dr. Gwenlyn Found who recommends initiation of [facility] once cleared by neurology. Patient with  resultant hemianopsia with left sided weakness,  HA, impulsivity and poor safety awareness. ST evaluation revealed dysarthria with acute on chronic issues with cognitive impairments, dysphagia with mild to moderate vallecular residue and silent aspiration of thin liquids--on D3, nectar liquids.  CIR recommended due to functional decline. He currently complains of left hand weakness.  Patient presents with significant cognitive-linguistic deficits characterized by decreased immediate and short-term recall, sustained attention, information processing, left visual inattention, problem solving skills, limited insight into deficits, and emergent awareness. 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 [employer] Mental Status (SLUMS) administered: 12/30 (score of <27 considered impairment based on patient's educational history). See below:  Orientation: 2/3 Word list recall: 2/5 Calculations: 2/3 Generative Naming: 0/3 Mental manipulation/digit reversal: 1/2 Clock Drawing/Executive Function: 0/4 Visuospatial: 1/2 Paragraph Recall: 4/8  Patient presents with mild dysarthria characterized by decreased articulatory precision and vocal intensity impacting speech intelligibility. Patient appeared intermittently aware of speech errors and was able to compensate by independently increasing vocal loudness. He was perceived as 75-90% intelligible at conversation level. Patient required extended processing time and repetition for comprehension. Verbal expression appeared grossly intact.   Oral mechanism exam revealed left facial asymmetry and weakness and mild lingual weakness impacting motor speech and oral phase. Patient exhibited effective mastication with Dys 2 textures and mildly prolonged yet effective mastication with Dys 3 textures with trace oral residue. Consumed single cup sips of nectar thick liquids without  s/sx of aspiration and clear vocal quality post swallows. Patient has been  consuming single ice chips intermittently during the day with supervision from spouse. However, spouse endorsed patient has not been consistently executing oral care prior to ice chip consumption. Educated extensively on oropharyngeal swallow deficits noted on recent MBS obtained 7/31, aspiration/silent aspiration, aspiratoin pneumonia, mixed consistencies, and importance of oral care. Spouse verbalized understanding with teach back. Patient unable to recall fully secondary to cognitive deficits. Recommend continuation of Dys 3 diet and nectar thick liquids. Single ice chips intermittently with supervision after oral care. Full supervision recommended during meals.   Patient would benefit from skilled SLP intervention to maximize cognitive-linguistic, speech, and swallow functions in order to maximize functional independence and decrease caregiver burden prior to discharge. Anticipate patient will require 24 hour supervision and would benefit from follow up outpatient SLP services.    Skilled Therapeutic Interventions          Administered [employer] Mental Status (SLUMS), further informal cognitive-linguistic, speech, and language assessment. Completed BSE. Educated patient's spouse and wife on results of assessments, diet recommendations, safe swallow precautions and strategies, and motor speech strategies.   SLP Assessment  Patient will need skilled Speech Lanaguage Pathology Services during CIR admission    Recommendations  SLP Diet Recommendations: Dysphagia 3 (Mech soft);Nectar Liquid Administration via: Cup Medication Administration: Whole meds with puree Supervision: Staff to assist with self feeding;Full supervision/cueing for compensatory strategies Compensations: Minimize environmental distractions;Slow rate;Small sips/bites Postural Changes and/or Swallow Maneuvers: Seated upright 90 degrees Oral Care  Recommendations: Oral care BID Recommendations for Other Services: Neuropsych consult Patient destination: Home Follow up Recommendations: Outpatient SLP Equipment Recommended: None recommended by SLP    SLP Frequency 3 to 5 out of 7 days   SLP Duration  SLP Intensity  SLP Treatment/Interventions 12-14  Minumum of 1-2 x/day, 30 to 90 minutes  Cognitive remediation/compensation;Environmental controls;Internal/external aids;Speech/Language facilitation;Cueing hierarchy;Dysphagia/aspiration precaution training;Functional tasks;Patient/family education    Pain Pain Assessment Pain Scale: 0-10 Pain Score: 0-No pain  Prior Functioning Cognitive/Linguistic Baseline: Baseline deficits Baseline deficit details: Patient and spouse endorsed cognitive issues secondary to COVID-19 Type of Home: House (Simultaneous filing. User may not have seen previous data.)  Lives With: Spouse (Simultaneous filing. User may not have seen previous data.) Available Help at Discharge: Family;Available PRN/intermittently (Simultaneous filing. User may not have seen previous data.) Vocation: Part time employment (Simultaneous filing. User may not have seen previous data.)  SLP Evaluation Cognition Overall Cognitive Status: Impaired/Different from baseline Arousal/Alertness: Awake/alert Orientation Level: Oriented X4 Attention: Sustained;Focused;Alternating Focused Attention: Appears intact Sustained Attention: Impaired Sustained Attention Impairment: Functional basic;Verbal basic Memory: Impaired Memory Impairment: Storage deficit;Retrieval deficit;Decreased recall of new information Immediate Memory Recall: Sock;Blue;Bed Memory Recall Sock: Without Cue Memory Recall Blue: Without Cue Memory Recall Bed: With Cue Awareness: Impaired Awareness Impairment: Emergent impairment Problem Solving: Impaired Problem Solving Impairment: Functional basic Executive Function:  Organizing;Sequencing;Reasoning Reasoning: Impaired Sequencing: Impaired Organizing: Impaired Behaviors: Impulsive Safety/Judgment: Impaired  Comprehension Auditory Comprehension Overall Auditory Comprehension: Other (comment) (Patient required increased processing time and verbal repetition) Conversation: Simple Interfering Components: Processing speed;Attention;Hearing EffectiveTechniques: Extra processing time;Repetition;Increased volume Expression Expression Primary Mode of Expression: Verbal Verbal Expression Overall Verbal Expression: Appears within functional limits for tasks assessed Written Expression Dominant Hand: Left Oral Motor Oral Motor/Sensory Function Overall Oral Motor/Sensory Function: Mild impairment Facial ROM: Reduced left Facial Symmetry: Abnormal symmetry left Facial Strength: Reduced left Facial Sensation: Within Functional Limits Lingual ROM: Within Functional Limits Lingual Symmetry: Within Functional Limits Lingual Strength: Reduced Mandible: Within Functional Limits Motor Speech Overall Motor Speech: Impaired Respiration:  Within functional limits Phonation: Normal Resonance: Within functional limits Articulation: Impaired Level of Impairment: Conversation Intelligibility: Intelligibility reduced Word: 75-100% accurate Phrase: 75-100% accurate Sentence: 75-100% accurate Conversation: 75-100% accurate Motor Planning: Witnin functional limits Motor Speech Errors: Not applicable Effective Techniques: Increased vocal intensity;Over-articulate  Care Tool Care Tool Cognition Expression of Ideas and Wants Expression of Ideas and Wants: Some difficulty - exhibits some difficulty with expressing needs and ideas (e.g, some words or finishing thoughts) or speech is not clear   Understanding Verbal and Non-Verbal Content Understanding Verbal and Non-Verbal Content: Usually understands - understands most conversations, but misses some part/intent of  message. Requires cues at times to understand   Memory/Recall Ability *first 3 days only Memory/Recall Ability *first 3 days only: Current season;That he or she is in a hospital/hospital unit    Intelligibility: Intelligibility reduced Word: 75-100% accurate Phrase: 75-100% accurate Sentence: 75-100% accurate Conversation: 75-100% accurate  Bedside Swallowing Assessment General Date of Onset: 03/30/21 Previous Swallow Assessment: MBSS on 04/01/21 Diet Prior to this Study: Dysphagia 3 (soft);Nectar-thick liquids Temperature Spikes Noted: No Respiratory Status: Room air History of Recent Intubation: No Behavior/Cognition: Alert;Cooperative;Pleasant mood;Impulsive Oral Cavity - Dentition: Adequate natural dentition Self-Feeding Abilities: Needs assist;Needs set up Vision: Impaired for self-feeding Patient Positioning: Upright in bed Baseline Vocal Quality: Normal Volitional Cough: Strong Volitional Swallow: Able to elicit  Oral Care Assessment Does patient have any of the following "high(er) risk" factors?: None of the above Does patient have any of the following "at risk" factors?: None of the above Patient is LOW RISK: Follow universal precautions (see row information) Ice Chips Ice chips: Not tested Thin Liquid Thin Liquid: Not tested Nectar Thick Nectar Thick Liquid: Within functional limits Presentation: Self Fed;Cup Honey Thick Honey Thick Liquid: Not tested Puree Puree: Within functional limits Presentation: Spoon Solid Solid: Impaired Oral Phase Impairments: Impaired mastication Oral Phase Functional Implications: Oral residue BSE Assessment Risk for Aspiration Impact on safety and function: Moderate aspiration risk  Short Term Goals: Week 1: SLP Short Term Goal 1 (Week 1): Patient will tolerate Dys 3 diet and nectar thick liquids with minimal-to-no overt s/sx of aspiration with use of safe swallow strategies with sup A verbal cues. SLP Short Term Goal 2 (Week  1): Patient will utilize speech intelligibility strategies at conversation level with sup A to achieve 90% intelligibility. SLP Short Term Goal 3 (Week 1): Patient will complete functional problem solving for basic tasks with min-to-mod A verbal cues. SLP Short Term Goal 4 (Week 1): Patient will demonstrate recall of novel information with min-to-mod A verbal cues for use of compensatory strategies. SLP Short Term Goal 5 (Week 1): Patient will demonstrate selective attention during functional tasks in mildly distracting environment with min A verbal cues for redirection  Refer to Care Plan for Long Term Goals  Recommendations for other services: Neuropsych  Discharge Criteria: Patient will be discharged from SLP if patient refuses treatment 3 consecutive times without medical reason, if treatment goals not met, if there is a change in medical status, if patient makes no progress towards goals or if patient is discharged from hospital.  The above assessment, treatment plan, treatment alternatives and goals were discussed and mutually agreed upon: by patient  Patty Sermons 04/05/2021, 5:50 PM

## 2021-04-05 NOTE — IPOC Note (Addendum)
Overall Plan of Care [facility]) Patient Details Name: Geoffrey Wells MRN: [id number] DOB: Jul 14, 1946  Admitting Diagnosis: Embolic stroke [facility])  Hospital Problems: Principal Problem:   Embolic stroke [facility])     Functional Problem List: Nursing Bladder, Bowel, Pain, Endurance, Safety, Medication Management  PT Balance, Safety, Behavior, Endurance, Motor, Nutrition, Pain, Perception, Skin Integrity  OT Balance, Perception, Behavior, Safety, Cognition, Endurance, Vision, Motor  SLP Cognition, Perception, Motor, Safety  TR         Basic ADL's: OT Grooming, Bathing, Dressing, Toileting, Eating     Advanced  ADL's: OT       Transfers: PT Bed Mobility, Bed to Chair, Car, Furniture, Floor  OT Toilet, Tub/Shower     Locomotion: PT Ambulation, Stairs     Additional Impairments: OT Fuctional Use of Upper Extremity  SLP Swallowing, Communication expression    TR      Anticipated Outcomes Item Anticipated Outcome  Self Feeding supervision  Swallowing  mod I   Basic self-care  supervision  Toileting  supervision   Bathroom Transfers supervision  Bowel/Bladder  Manage bowel with mod I and bladder with toileting  Transfers  supervision using LRAD  Locomotion  supervision using LRAD  Communication  mod I  Cognition  sup-to-minA  Pain  at or below level 4  Safety/Judgment  maintain safety with cues/reminders   Therapy Plan: PT Intensity: Minimum of 1-2 x/day ,45 to 90 minutes PT Frequency: 5 out of 7 days PT Duration Estimated Length of Stay: 12-14 days OT Intensity: Minimum of 1-2 x/day, 45 to 90 minutes OT Frequency: Total of 15 hours over 7 days of combined therapies OT Duration/Estimated Length of Stay: 12-14 days SLP Intensity: Minumum of 1-2 x/day, 30 to 90 minutes SLP Frequency: 3 to 5 out of 7 days SLP Duration/Estimated Length of Stay: 12-14   Due to the current state of emergency, patients may not be receiving their 3-hours of Medicare-mandated therapy.    Team Interventions: Nursing Interventions Bladder Management, Disease Management/Prevention, Medication Management, Discharge Planning, Pain Management, Bowel Management, Patient/Family Education  PT interventions Ambulation/gait training, Community reintegration, DME/adaptive equipment instruction, Neuromuscular re-education, Psychosocial support, Stair training, UE/LE Strength taining/ROM, Training and development officer, Discharge planning, Functional electrical stimulation, Pain management, Skin care/wound management, Therapeutic Activities, UE/LE Coordination activities, Cognitive remediation/compensation, Disease management/prevention, Functional mobility training, Patient/family education, Splinting/orthotics, Visual/perceptual remediation/compensation, Therapeutic Exercise  OT Interventions Balance/vestibular training, Discharge planning, Pain management, Self Care/advanced ADL retraining, Therapeutic Activities, UE/LE Coordination activities, Cognitive remediation/compensation, Disease mangement/prevention, Functional mobility training, Patient/family education, Therapeutic Exercise, Visual/perceptual remediation/compensation, DME/adaptive equipment instruction, Neuromuscular re-education, Psychosocial support, Splinting/orthotics, UE/LE Strength taining/ROM  SLP Interventions Cognitive remediation/compensation, Environmental controls, Internal/external aids, Speech/Language facilitation, Cueing hierarchy, Dysphagia/aspiration precaution training, Functional tasks, Patient/family education  TR Interventions    SW/CM Interventions Discharge Planning, Psychosocial Support, Patient/Family Education, Disease Management/Prevention   Barriers to Discharge MD  Medical stability and hx chronic fatigue post Covid  Nursing Decreased caregiver support, Home environment access/layout 1 level 5ste, right rail w spouse; has SPC and RW  PT Lack of/limited family support    OT      SLP      SW Lack of/limited  family support     Team Discharge Planning: Destination: PT-Home ,OT- Home , SLP-Home Projected Follow-up: PT-Outpatient PT, 24 hour supervision/assistance, OT-  24 hour supervision/assistance, Outpatient OT, SLP-Outpatient SLP Projected Equipment Needs: PT-To be determined, OT- To be determined, SLP-None recommended by SLP Equipment Details: PT-has RW & SPC, OT-  Patient/family involved in discharge planning: PT- Patient,  Family member/caregiver,  OT-Patient, Family member/caregiver, SLP-Patient, Family member/caregiver  MD ELOS: 10-14d Medical Rehab Prognosis:  Excellent Assessment: 75 year old LH male with history of throat cancer, ascending thoracic aorta aneurysm, Covid X 2, PE, chronic neck pain s/p recent manipulation who was admitted on 03/30/21 with weakness of left hand. CT head negative and CTA negative for dissection. He received tPA and follow CT head showed new acute IPH in posterior right temporal lobe, newly occluded right P3 PCA branch and small volume acute subarachnoid hemorrhage along right temporal and occipital lobes.  He received cryoprecipitate and TXA due to ongoing bleeding and transferred to [facility] for management. MRI brain done revealing multifocal infarcts involving R-MCA/PCA distributions and 4.1 X 3.2 cm IPH in right temporal lobe with scattered small volume SAH as well as acute infarct mesial left occipital lobe.   BLE dopplers were negative for DVT. 2 D echo showed EF showed moderate aortic regurgitation and EF 60-65%.  Hospital course significant for issues with headache and neck pain as well as new onset A. fib.  He was evaluated by Dr. Gwenlyn Found who recommends initiation of [facility] once cleared by neurology. Patient with  resultant hemianopsia with left sided weakness, HA, impulsivity and poor safety awareness. ST evaluation revealed dysarthria with acute on chronic issues with cognitive impairments, dysphagia with mild to moderate vallecular residue and silent  aspiration of thin liquids--on D3, nectar liquids.  CIR recommended due to functional decline. He currently complains of left hand weakness.      See Team Conference Notes for weekly updates to the plan of care

## 2021-04-06 ENCOUNTER — Inpatient Hospital Stay (HOSPITAL_COMMUNITY): Payer: No Typology Code available for payment source

## 2021-04-06 DIAGNOSIS — I6310 Cerebral infarction due to embolism of unspecified precerebral artery: Secondary | ICD-10-CM | POA: Diagnosis not present

## 2021-04-06 LAB — URINALYSIS, ROUTINE W REFLEX MICROSCOPIC
Bilirubin Urine: NEGATIVE
Glucose, UA: NEGATIVE mg/dL
Hgb urine dipstick: NEGATIVE
Ketones, ur: NEGATIVE mg/dL
Leukocytes,Ua: NEGATIVE
Nitrite: NEGATIVE
Protein, ur: NEGATIVE mg/dL
Specific Gravity, Urine: >1.03 — ABNORMAL HIGH (ref 1.005–1.030)
pH: 6 (ref 5.0–8.0)

## 2021-04-06 LAB — BASIC METABOLIC PANEL
Anion gap: 9 (ref 5–15)
BUN: 27 mg/dL — ABNORMAL HIGH (ref 8–23)
CO2: 25 mmol/L (ref 22–32)
Calcium: 9.3 mg/dL (ref 8.9–10.3)
Chloride: 99 mmol/L (ref 98–111)
Creatinine, Ser: 1.35 mg/dL — ABNORMAL HIGH (ref 0.61–1.24)
GFR, Estimated: 55 mL/min — ABNORMAL LOW (ref >60)
Glucose, Bld: 133 mg/dL — ABNORMAL HIGH (ref 70–99)
Potassium: 3.8 mmol/L (ref 3.5–5.1)
Sodium: 133 mmol/L — ABNORMAL LOW (ref 135–145)

## 2021-04-06 MED ORDER — LISINOPRIL 5 MG PO TABS
5.0000 mg | ORAL_TABLET | Freq: Every day | ORAL | Status: DC
  Filled 2021-04-06: qty 1

## 2021-04-06 MED ORDER — SODIUM CHLORIDE 0.9 % IV BOLUS
500.0000 mL | Freq: Once | INTRAVENOUS | Status: AC
  Administered 2021-04-06: 500 mL via INTRAVENOUS

## 2021-04-06 MED ORDER — SODIUM CHLORIDE 0.9 % IV SOLN: INTRAVENOUS | Status: DC

## 2021-04-06 MED ORDER — POTASSIUM CHLORIDE IN NACL 20-0.9 MEQ/L-% IV SOLN
INTRAVENOUS | Status: DC
  Filled 2021-04-06 (×4): qty 1000

## 2021-04-06 NOTE — Progress Notes (Addendum)
Physical Therapy Session Note  Patient Details  Name: Geoffrey Wells MRN: [id number] Date of Birth: 21-Sep-1945  Today's Date: 04/06/2021 PT Individual Time: 0900-0930 PT Individual Time Calculation (min): 30 min  Missed time: 99' Short Term Goals: Week 1:  PT Short Term Goal 1 (Week 1): Pt will perform supine<>sit with CGA PT Short Term Goal 2 (Week 1): Pt will perform sit<>stands using LRAD with CGA PT Short Term Goal 3 (Week 1): Pt will perform bed<>chair transfers using LRAD with CGA PT Short Term Goal 4 (Week 1): Pt will ambulate at least 169f using LRAD with CGA PT Short Term Goal 5 (Week 1): Pt will ascend/descend 4 steps using R HR with CGA  Skilled Therapeutic Interventions/Progress Updates: Pt presents supine in bed but agreeable to therapy.  Pt asks about temp of room as feels hot.  Pt performed log roll w/ CGA and then to sitting 2/2 hx of back pain.  Pt sat EOB and states some dizziness which dissipated after 1'.  Pt transfers sit to stand w/ min A from bed and amb x 3' to w/c.  Pt c/o nausea and dizziness when sitting.  Pt requested emesis basin, but not needed.  Pt still c/o dizziness.  BP taken in sitting at 128/77 w/ O2 at 98% on RA.  Pt states no dizziness after 4'.  Pt transferred sit to stand w/ CGA from w/c and attempted standing BP but unable to stand > 20 seconds 2/2 dizziness.  Nursing present and checked BP in sitting w/ 118/66.  Pt amb short distance in room w/o AD x 10' including turns to bed.  Pt sat EOB and then to attempt further PT, but c/o dizziness.  Pt transferred sit to R sidelying, and remained per pt request.  Pt then transferred to supine w/ CGA.  Bed alarm on and all needs in reach.     Therapy Documentation Precautions:  Precautions Precautions: Fall, Other (comment) Precaution Comments: L hemianopsia, SBP goal <140, decreased safety awareness Restrictions Weight Bearing Restrictions: No General: PT Amount of Missed Time (min): 45 Minutes PT Missed  Treatment Reason: Patient ill (Comment) (nauseated and dizziness.) Vital Signs:  Pain: 0/10 w/o activity, sit to stand increased to 8/10 LB       Therapy/Group: Individual Therapy  JLadoris Gene8/12/2020, 9:55 AM

## 2021-04-06 NOTE — Progress Notes (Signed)
Physical Therapy Session Note  Patient Details  Name: Geoffrey Wells MRN: [id number] Date of Birth: October 05, 1945  Today's Date: 04/06/2021 PT Individual Time: 1105-1200 PT Individual Time Calculation (min): 55 min   Short Term Goals: Week 1:  PT Short Term Goal 1 (Week 1): Pt will perform supine<>sit with CGA PT Short Term Goal 2 (Week 1): Pt will perform sit<>stands using LRAD with CGA PT Short Term Goal 3 (Week 1): Pt will perform bed<>chair transfers using LRAD with CGA PT Short Term Goal 4 (Week 1): Pt will ambulate at least 136f using LRAD with CGA PT Short Term Goal 5 (Week 1): Pt will ascend/descend 4 steps using R HR with CGA  Skilled Therapeutic Interventions/Progress Updates:  Patient supine in bed on entrance to room. Wife, MStanton Kidney is present. Patient alert and agreeable to PT session. Patient with no pain complaint throughout during session. Pt does however, c/o nausea associate with lightheadedness with seated positioning this morning. Has not been able to participate in full therapy session yet.   Therapeutic Activity: Bed Mobility: Patient performs supine <> sit consistently throughout session with supervision. Sitting balance is good and pt is able to Mika socks and shoes using BUE with Fair+ to Good balance. One sway posteriorly with increased effort to bring foot over knee in figure 4 position for sock donning and pt choosing to lie back and support neck on bedrail while donning sock. Able to return to upright seated position with supervision. VC/ tc required for increased effort and push from BUE to reach seated position.  See orthostatic Blood pressure readings below.  Educated pt and wife that pt's body has not yet taken control of Bp regulation with changes in body position against gravity and will need to practice changing positions. Pt is unable to maintain seated position EOB without symptoms of lightheadedness within 217m of reaching position. Demonstrated to pt and wife how to  manipulate bed positioning for supported upright sitting position for safety.   Wife with questions throughout session re: care and progress. Answered questions to best of PT's knowledge and referred to MD/ nsg for further clarification.  Patient supine  in bed at end of session with brakes locked, bed alarm set, and all needs within reach.     Therapy Documentation Precautions:  Precautions Precautions: Fall, Other (comment) Precaution Comments: L hemianopsia, SBP goal <140, decreased safety awareness Restrictions Weight Bearing Restrictions: No Vital Signs: ORTHOSTATIC VITALS Initial seated EOB BP: 84/ 61, Pulse 68, pt states lightheadedness, nausea  Supine: 119/ 65 Seated (after 82m60m: 85/ 57, pulse 65, Pt states lightheadedness and nausea (Returned to supine)  Attempted to reach standing with pt unable as he c/o lightheadedness and nausea upon reaching seated position at next transition.   Therapy/Group: Individual Therapy  JulAlger Simons, DPT 04/06/2021, 12:41 PM

## 2021-04-06 NOTE — Progress Notes (Signed)
Patient with dizziness and hypotension--note that he was febrile last night with Tmax 100.6. Has not voided all day --on nectar liquids and advised pushig fluids.    Will check UA/UCS, encourage fluid intake, order BMET and will bolus X 1 with 500 cc. Question UTI as cause of symptoms. Order orthostatic vitals. Will decrease lisinopril and set parameters on BP meds.

## 2021-04-06 NOTE — Progress Notes (Signed)
PROGRESS NOTE   Subjective/Complaints:  Woke up early this am, does not know why  ROS- neg CP, SOB, N/V/D  Objective:   No results found. Recent Labs    04/05/21 0502  WBC 6.9  HGB 12.9*  HCT 39.0  PLT 190    Recent Labs    04/05/21 0502  NA 131*  K 3.9  CL 96*  CO2 23  GLUCOSE 119*  BUN 16  CREATININE 1.18  CALCIUM 9.4     Intake/Output Summary (Last 24 hours) at 04/06/2021 0747 Last data filed at 04/05/2021 1836 Gross per 24 hour  Intake 118 ml  Output --  Net 118 ml         Physical Exam: Vital Signs Blood pressure 131/70, pulse 80, temperature (!) 97.2 F (36.2 C), temperature source Oral, resp. rate 18, height '6\' 1"'$  (1.854 m), weight 94.5 kg, SpO2 98 %.  General: No acute distress Mood and affect are appropriate Heart: Regular rate and rhythm no rubs murmurs or extra sounds Lungs: Clear to auscultation, breathing unlabored, no rales or wheezes Abdomen: Positive bowel sounds, soft nontender to palpation, nondistended Extremities: No clubbing, cyanosis, or edema Skin: No evidence of breakdown, no evidence of rash  Neurologic: Cranial nerves II through XII intact, motor strength is 5/5 in RIght and 4/5 left deltoid, bicep, tricep, grip, hip flexor, knee extensors, ankle dorsiflexor and plantar flexor Sensory exam normal sensation to light touch and proprioception in bilateral upper and lower extremities Cerebellar exam normal finger to nose to finger as well as heel to shin in bilateral upper and lower extremities Musculoskeletal: Full range of motion in all 4 extremities. No joint swelling    Assessment/Plan: 1. Functional deficits which require 3+ hours per day of interdisciplinary therapy in a comprehensive inpatient rehab setting. Physiatrist is providing close team supervision and 24 hour management of active medical problems listed below. Physiatrist and rehab team continue to assess  barriers to discharge/monitor patient progress toward functional and medical goals  Care Tool:  Bathing              Bathing assist Assist Level: Maximal Assistance - Patient 24 - 49%     Upper Body Dressing/Undressing Upper body dressing        Upper body assist Assist Level: Minimal Assistance - Patient > 75%    Lower Body Dressing/Undressing Lower body dressing            Lower body assist Assist for lower body dressing: Moderate Assistance - Patient 50 - 74%     Toileting Toileting Toileting Activity did not occur Landscape architect and hygiene only): N/A (no void or bm)  Toileting assist Assist for toileting:  (no void)     Transfers Chair/bed transfer  Transfers assist     Chair/bed transfer assist level: Minimal Assistance - Patient > 75%     Locomotion Ambulation   Ambulation assist      Assist level: Minimal Assistance - Patient > 75% Assistive device: No Device Max distance: 32f   Walk 10 feet activity   Assist     Assist level: Minimal Assistance - Patient > 75% Assistive device: No Device  Walk 50 feet activity   Assist    Assist level: Minimal Assistance - Patient > 75% Assistive device: No Device    Walk 150 feet activity   Assist Walk 150 feet activity did not occur: Safety/medical concerns (endurance)         Walk 10 feet on uneven surface  activity   Assist Walk 10 feet on uneven surfaces activity did not occur:  (environmental - not accessible)         Wheelchair     Assist Will patient use wheelchair at discharge?: No             Wheelchair 50 feet with 2 turns activity    Assist            Wheelchair 150 feet activity     Assist          Blood pressure 131/70, pulse 80, temperature (!) 97.2 F (36.2 C), temperature source Oral, resp. rate 18, height '6\' 1"'$  (1.854 m), weight 94.5 kg, SpO2 98 %.  Medical Problem List and Plan: 1.  Embolic right MCA/PCA infarct              -patient may shower             -ELOS/Goals: 7-10 days S 2.  Antithrombotics: -DVT/anticoagulation:  Mechanical: Sequential compression devices, below knee Bilateral lower extremities. Dopplers negative at admission--monitor for now.             -antiplatelet therapy: ASA 3. Headaches:  tylenol prn. Start topamax '25mg'$  HS 4. Mood: [name] to follow for evaluation and support.             -antipsychotic agents: N/A 5. Neuropsych: This patient may be intermittenly capable of making decisions on his own behalf. 6. Skin/Wound Care: Routine pressure relief measures. 7. Fluids/Electrolytes/Nutrition: Monitor I/O. Check lytes in am., mild hyponatremia, Kidney fxn ok 8. HTN: Monitor BP TID--on metoprolol and Lisinopril              Vitals:   04/05/21 1932 04/06/21 0342  BP: 138/90 131/70  Pulse: 78 80  Resp: 18 18  Temp: (!) 100.6 F (38.1 C) (!) 97.2 F (36.2 C)  SpO2: 99% 98%  May adjust   9. New onset A fib: Monitor HR tid--continue metoprolol, mild tachy cont to monitor prior to BB dose adjustment              --On ASA with recs 10. Headaches/neck pain/Myalgias: Ice for neck pain. Low dose topamax for HA. --amantadine for activation.   11. Pre-diabetes: Hgb A1c- 5.8. Educate on CM diet. 12. Dyslipidemia:  Now on Lipitor for LDL 184. 13. Decreased initiation: start amantadine. 14. 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   15.  Elevated temp x 1 no symptoms or recurrence will monitor for now , WBC normal yesterday   LOS: 2 days A FACE TO FACE EVALUATION WAS PERFORMED  Charlett Blake 04/06/2021, 7:47 AM

## 2021-04-06 NOTE — Progress Notes (Signed)
Occupational Therapy Session Note  Patient Details  Name: Geoffrey Wells MRN: [id number] Date of Birth: 02-15-1946  Today's Date: 04/06/2021 OT Individual Time: 1020-1100 OT Individual Time Calculation (min): 40 min   Short Term Goals: Week 1:  OT Short Term Goal 1 (Week 1): Pt will complete LB dressing with CGA. OT Short Term Goal 2 (Week 1): Pt will tolerate bathing UB and LB sitting/standing at shower level with min assist. OT Short Term Goal 3 (Week 1): Pt will toilet and complete toilet transfer with CGA. OT Short Term Goal 4 (Week 1): Pt will brush teeth standing sinkside with close supervision.  Skilled Therapeutic Interventions/Progress Updates:    Pt seen for makeup therapy time, greeted while sitting on the toilet. OT turned on the water in shower to try to promote +bladder void however pt unable to void. Notified NT. Pt reported not feeling dizzy, wanted to ambulate back to back vs stand pivot, HHA for functional ambulation from toilet back to bed. He initiated lying down in bed to rest for a short time due to standing fatigue. Pt agreeable to eat some ice chips sitting EOB. Note that he became dizzy after transition to sitting. Needed to wait a few minutes for dizziness to subside. After hand washing, he consumed 4 ice chips with full supervision, no s/s aspiration. Oral care completed with supervision afterwards, pt requiring increased time to initiate and execute motor movements, encouragement for increasing functional use of the [name] hand. Note that pt had a difficult time capping his toothpaste due to [name] FM deficits. Supervision for washing/drying face with warm wash cloth after. Pt remained in bed at close of session, all needs within reach and bed alarm set, anticipating next therapist.   Provided pt with an antinausea aromatherapy blend to address nausea during session, also provided him with 2 emesis bags. No emesis during our session together  Therapy Documentation Precautions:   Precautions Precautions: Fall, Other (comment) Precaution Comments: L hemianopsia, SBP goal <140, decreased safety awareness Restrictions Weight Bearing Restrictions: No General: General PT Missed Treatment Reason: Patient ill (Comment) (nauseated and dizziness.) Vital Signs:  Pain: no c/o pain, just dizziness + nausea   ADL: ADL Grooming: Minimal assistance Where Assessed-Grooming: Sitting at sink    Therapy/Group: Individual Therapy  Miliana Gangwer A Camille Thau 04/06/2021, 12:19 PM

## 2021-04-06 NOTE — Progress Notes (Signed)
Occupational Therapy Session Note  Patient Details  Name: Geoffrey Wells MRN: [id number] Date of Birth: November 01, 1945  Today's Date: 04/06/2021 OT Individual Time: 1300-1326 OT Individual Time Calculation (min): 26 min    Short Term Goals: Week 1:  OT Short Term Goal 1 (Week 1): Pt will complete LB dressing with CGA. OT Short Term Goal 2 (Week 1): Pt will tolerate bathing UB and LB sitting/standing at shower level with min assist. OT Short Term Goal 3 (Week 1): Pt will toilet and complete toilet transfer with CGA. OT Short Term Goal 4 (Week 1): Pt will brush teeth standing sinkside with close supervision.  Skilled Therapeutic Interventions/Progress Updates:    Patient in bed, alert and denies pain.   Discussed BP and episodes of dizziness this am.  Applied teds and donned shoes.  BP in supine 121/63, sat edge of bed with CGA - patient notes dizziness, BP 103/68.  Remained sitting for approx 5 minutes with upper body and trunk activities - note fatigue but he denies dizziness, BP 116/71.  Completed sit to stand at edge of bed with CGA.  He is able to complete weight shift and light activity in stance - unable to tolerate standing long enough to get another BP in this position.  Returned to sitting CGA, sit to supine CGA.  Completed left UE coordination activity and visual motor exercises with ongoing cues for visual component.  Provided theraputty while nursing setting up for IV.  Patient with nursing at close of session, in bed, bed alarm set and call bell in reach.    Therapy Documentation Precautions:  Precautions Precautions: Fall, Other (comment) Precaution Comments: L hemianopsia, SBP goal <140, decreased safety awareness Restrictions Weight Bearing Restrictions: No Other Treatments:     Therapy/Group: Individual Therapy  Carlos Levering 04/06/2021, 7:41 AM

## 2021-04-06 NOTE — Progress Notes (Signed)
Speech Language Pathology Daily Session Note  Patient Details  Name: Geoffrey Wells MRN: [id number] Date of Birth: November 18, 1945  Today's Date: 04/06/2021 SLP Individual Time: 1416-1500 SLP Individual Time Calculation (min): 44 min  Short Term Goals: Week 1: SLP Short Term Goal 1 (Week 1): Patient will tolerate Dys 3 diet and nectar thick liquids with minimal-to-no overt s/sx of aspiration with use of safe swallow strategies with sup A verbal cues. SLP Short Term Goal 2 (Week 1): Patient will utilize speech intelligibility strategies at conversation level with sup A to achieve 90% intelligibility. SLP Short Term Goal 3 (Week 1): Patient will complete functional problem solving for basic tasks with min-to-mod A verbal cues. SLP Short Term Goal 4 (Week 1): Patient will demonstrate recall of novel information with min-to-mod A verbal cues for use of compensatory strategies. SLP Short Term Goal 5 (Week 1): Patient will demonstrate selective attention during functional tasks in mildly distracting environment with min A verbal cues for redirection  Skilled Therapeutic Interventions: Pt seen for skilled ST with focus on cognitive and swallow goals, pt wife present throughout. Pt completing oral care independently, tolerating ice chips with apparent timely oral and pharyngeal phase of swallow, no overt s/s aspiration. Will continue to re-assess swallow function and potential for upgrade. Educated pt and wife on ongoing use of compensatory strategies for speech intelligibility, pt states he always spoke softly but wife states it is exacerbated since CVA. Pt benefits from mod cues to recall and utilize strategies throughout session. Pt completing mildly complex problem solving tasks with 85% accuracy min verbal cues to ID problem, able to solve with 100% accuracy. Pt left in bed with all needs within reach. Cont ST POC.   Pain Pain Assessment Pain Scale: 0-10 Pain Score: 0-No pain  Therapy/Group: Individual  Therapy  [name] 04/06/2021, 3:43 PM

## 2021-04-07 DIAGNOSIS — R509 Fever, unspecified: Secondary | ICD-10-CM

## 2021-04-07 DIAGNOSIS — I951 Orthostatic hypotension: Secondary | ICD-10-CM

## 2021-04-07 DIAGNOSIS — I10 Essential (primary) hypertension: Secondary | ICD-10-CM

## 2021-04-07 MED ORDER — LIDOCAINE HCL URETHRAL/MUCOSAL 2 % EX GEL
1.0000 "application " | CUTANEOUS | Status: DC | PRN
  Administered 2021-04-07: 1 via URETHRAL
  Filled 2021-04-07: qty 6

## 2021-04-07 MED ORDER — LISINOPRIL 5 MG PO TABS
5.0000 mg | ORAL_TABLET | Freq: Every day | ORAL | Status: DC
  Administered 2021-04-09 – 2021-04-10 (×2): 5 mg via ORAL
  Filled 2021-04-07 (×3): qty 1

## 2021-04-07 NOTE — Progress Notes (Signed)
Occupational Therapy Session Note  Patient Details  Name: Geoffrey Wells MRN: [id number] Date of Birth: 23-Sep-1945  Today's Date: 04/08/2021 OT Group Time: 1105-1200 OT Group Time Calculation (min): 55 min  Skilled Therapeutic Interventions/Progress Updates:    Pt engaged in therapeutic w/c level dance group focusing on patient choice, UE/LE strengthening, salience, activity tolerance, and social participation. Pt was guided through various dance-based exercises involving UEs/LEs and trunk. All music was selected by group members. Emphasis placed on Lt NMR and activity tolerance. Pt attended group with [name]. They both suggested songs to play. Pt required cues from therapist and Geoffrey Wells for sustained participation during exercise and increasing amplitude of movement on the affected Lt side. Pt easily fatigued and took several rest breaks, reported some dizziness at close of session. Geoffrey Wells and pt aware to notify RN about dizziness upon returning to the room. Left in care of NT for transporting back to room.    Therapy Documentation Precautions:  Precautions Precautions: Fall, Other (comment) Precaution Comments: L hemianopsia, SBP goal <140, decreased safety awareness Restrictions Weight Bearing Restrictions: No  Pain: no s/s pain during tx Pain Assessment Pain Scale: 0-10 Pain Score: 0-No pain ADL: ADL Grooming: Minimal assistance Where Assessed-Grooming: Sitting at sink     Therapy/Group: Group Therapy  Elvis Boot A Senna Lape 04/08/2021, 12:41 PM

## 2021-04-07 NOTE — Progress Notes (Signed)
Physical Therapy Session Note  Patient Details  Name: Geoffrey Wells MRN: [id number] Date of Birth: 09-Dec-1945  Today's Date: 04/07/2021 PT Individual Time: 1310-1404 and 1705-1751 PT Individual Time Calculation (min): 54 min and 46 min  Short Term Goals: Week 1:  PT Short Term Goal 1 (Week 1): Pt will perform supine<>sit with CGA PT Short Term Goal 2 (Week 1): Pt will perform sit<>stands using LRAD with CGA PT Short Term Goal 3 (Week 1): Pt will perform bed<>chair transfers using LRAD with CGA PT Short Term Goal 4 (Week 1): Pt will ambulate at least 135f using LRAD with CGA PT Short Term Goal 5 (Week 1): Pt will ascend/descend 4 steps using R HR with CGA  Skilled Therapeutic Interventions/Progress Updates:    Session 1:  ACaryl Pina RN confirmed pt appropriate for therapy - reports she donned his knee hight TED hose this AM. Pt received supported, upright in bed with meal tray in place. Pt agreeable to therapy session. Vitals below - pt receiving IV fluids - educated pt on importance of increased fluid consumption. Supine>sitting R EOB, HOB elevated and using bedrail, supervision. Sit<>stands, no AD, with CGA for steadying during session. L stand pivot EOB>w/c, no AD, CGA for steadying.  Transported to/from gym in w/c for time management and energy conservation. Pt reports tingling/numbness in L hand. Gait training 232fx2, no AD, with light min assist for balance - slow gait speed though reciprocal pattern and increased trunk flexion. Pt reporting increased fatigue after ambulating. Therapist provided pt with small foam blocks and a cup to perform L UE NMR task in the room working on fine motor skills - pt demonstrates ability to complete 1x requiring 1x cue to use L UE and not R. Transported back to room and pt agreeable to remain sitting upright in recliner. L stand pivot w/c>recliner, no AD, CGA for steadying. Pt left seated in recliner, tilted back with B LEs elevated, seat belt alarm on, needs in  reach, and RN present.  Supine vitals (HOB elevated 45degrees): BP 140/75 (MAP 94), HR 65bpm   Seated: BP 119/68 (MAP 82), HR 78bpm , reports lightheadedness, performed B UE seated overhead movements for BP recovery with minimal improvement in symptoms  Seated after 2 minutes: BP 113/70 (MAP 82), HR 76bpm   Standing: BP 101/56 (MAP 67), HR 84bpm, pt reporting worsening lightheadedness symptoms with longer time in standing  After 1st 2521fait trial: BP 124/74 (MAP 89), HR 79bpm, SpO2 99%  At end of session: BP 100/65 (MAP 77), HR 76bpm      Session 2: Pt received supine in bed and agreeable to therapy session.B LE knee high TEDs already donned. NT exiting upon therapist arrival and had just assessed supine vitals. Supine>sitting R EOB, HOB elevated and using bedrail with supervision. Sitting vitals: BP 166/84 (MAP 107), HR 70bpm with pt denying any symptoms - ACaryl PinaN aware of elevated SBP. L stand pivot EOB>w/c, no AD, with CGA for steadying.  Transported to/from gym in w/c for time management and energy conservation. Gait training [id number] AD, with light min assist for balance as pt demonstrates increased path deviation with slight R/L staggering. Reassessed vitals: BP 156/86 (MAP 105), HR 78bpm, no symptoms. Gait training [id number] AD, light min assist with same gait pattern as above as well as pt tending to veer towards R side of the room. Reassessed vitals: BP 160/86 (MAP 108), HR 81bpm, SpO2 100%. Requires longer rest break after increased gait distance due to fatigue.  Gait training ~76f, no AD, with light min assist as above. Transported back to room. Stand pivot w/c>EOB, no AD, with CGA for steadying. Sit>supine, HOB partially elevated and using bedrail, with supervision. Pt left supine in bed with needs in reach and bed alarm on.   Therapy Documentation Precautions:  Precautions Precautions: Fall, Other (comment) Precaution Comments: L hemianopsia, SBP goal <140, decreased safety  awareness Restrictions Weight Bearing Restrictions: No   Pain: Session 1: Continues to report headache pain - premedicated - provided rest breaks for pain management.  Session 2: Continues to report chronic neck pain - provided repositioning and rest breaks for pain management - discussed recommendation of having follow-up OPPT to address chronic neck pain.    Therapy/Group: Individual Therapy  CTawana Scale, PT, DPT, NCS, CSRS 04/07/2021, 12:25 PM

## 2021-04-07 NOTE — Progress Notes (Signed)
PROGRESS NOTE   Subjective/Complaints:  Up in bed. Says dizziness better this morning when he got up. Was quite dizzy yesterday afternoon with hypotension. Afebrile this morning.   ROS: Patient denies fever, rash, sore throat, blurred vision, nausea, vomiting, diarrhea, cough, shortness of breath or chest pain, joint or back pain, headache, or mood change.   Objective:   DG Chest 2 View  Result Date: 04/06/2021 CLINICAL DATA:  Fevers EXAM: CHEST - 2 VIEW COMPARISON:  01/28/2020 FINDINGS: Cardiac shadow is mildly enlarged but accentuated by the frontal technique. Hiatal hernia is again identified and stable. Aortic calcifications are seen. Chronic scarring is noted in the right upper lobe but significantly improved when compared with the prior exam. Elevation of the right hemidiaphragm is again noted. No new focal infiltrate is seen. No acute bony abnormality is noted. IMPRESSION: Chronic scarring without acute abnormality. Electronically Signed   By: Inez Catalina M.D.   On: 04/06/2021 20:59   Recent Labs    04/05/21 0502  WBC 6.9  HGB 12.9*  HCT 39.0  PLT 190   Recent Labs    04/05/21 0502 04/06/21 1244  NA 131* 133*  K 3.9 3.8  CL 96* 99  CO2 23 25  GLUCOSE 119* 133*  BUN 16 27*  CREATININE 1.18 1.35*  CALCIUM 9.4 9.3    Intake/Output Summary (Last 24 hours) at 04/07/2021 0924 Last data filed at 04/06/2021 1842 Gross per 24 hour  Intake 708 ml  Output 200 ml  Net 508 ml        Physical Exam: Vital Signs Blood pressure 108/69, pulse 76, temperature 97.6 F (36.4 C), resp. rate 20, height '6\' 1"'$  (1.854 m), weight 94.5 kg, SpO2 97 %.  Constitutional: No distress . Vital signs reviewed. HEENT: NCAT, EOMI, oral membranes moist Neck: supple Cardiovascular: RRR without murmur. No JVD    Respiratory/Chest: CTA Bilaterally without wheezes or rales. Normal effort    GI/Abdomen: BS +, non-tender, non-distended Ext: no  clubbing, cyanosis, or edema Psych: pleasant and cooperative  Skin: No evidence of breakdown, no evidence of rash  Neurologic: Cranial nerves II through XII intact, sl dysarthria.  motor strength is 5/5 in RIght and 4/5 left deltoid, bicep, tricep, grip, hip flexor, knee extensors, ankle dorsiflexor and plantar flexor Sensory exam normal sensation to light touch and proprioception in bilateral upper and lower extremities Cerebellar exam normal finger to nose to finger as well as heel to shin in bilateral upper and lower extremities Musculoskeletal: Full range of motion in all 4 extremities. No joint swelling    Assessment/Plan: 1. Functional deficits which require 3+ hours per day of interdisciplinary therapy in a comprehensive inpatient rehab setting. Physiatrist is providing close team supervision and 24 hour management of active medical problems listed below. Physiatrist and rehab team continue to assess barriers to discharge/monitor patient progress toward functional and medical goals  Care Tool:  Bathing              Bathing assist Assist Level: Maximal Assistance - Patient 24 - 49%     Upper Body Dressing/Undressing Upper body dressing        Upper body assist Assist Level: Minimal  Assistance - Patient > 75%    Lower Body Dressing/Undressing Lower body dressing            Lower body assist Assist for lower body dressing: Moderate Assistance - Patient 50 - 74%     Toileting Toileting Toileting Activity did not occur Landscape architect and hygiene only): N/A (no void or bm)  Toileting assist Assist for toileting:  (no void)     Transfers Chair/bed transfer  Transfers assist     Chair/bed transfer assist level: Minimal Assistance - Patient > 75%     Locomotion Ambulation   Ambulation assist      Assist level: Minimal Assistance - Patient > 75% Assistive device: No Device Max distance: 10   Walk 10 feet activity   Assist     Assist  level: Minimal Assistance - Patient > 75% Assistive device: No Device   Walk 50 feet activity   Assist    Assist level: Minimal Assistance - Patient > 75% Assistive device: No Device    Walk 150 feet activity   Assist Walk 150 feet activity did not occur: Safety/medical concerns (endurance)         Walk 10 feet on uneven surface  activity   Assist Walk 10 feet on uneven surfaces activity did not occur:  (environmental - not accessible)         Wheelchair     Assist Will patient use wheelchair at discharge?: No             Wheelchair 50 feet with 2 turns activity    Assist            Wheelchair 150 feet activity     Assist          Blood pressure 108/69, pulse 76, temperature 97.6 F (36.4 C), resp. rate 20, height '6\' 1"'$  (1.854 m), weight 94.5 kg, SpO2 97 %.  Medical Problem List and Plan: 1.  Embolic right MCA/PCA infarct             -patient may shower             -ELOS/Goals: 7-10 days S 2.  Antithrombotics: -DVT/anticoagulation:  Mechanical: Sequential compression devices, below knee Bilateral lower extremities. Dopplers negative at admission--monitor for now.             -antiplatelet therapy: ASA 3. Headaches:  tylenol prn. Start topamax '25mg'$  HS 4. Mood: [name] to follow for evaluation and support.             -antipsychotic agents: N/A 5. Neuropsych: This patient may be intermittenly capable of making decisions on his own behalf. 6. Skin/Wound Care: Routine pressure relief measures. 7. Fluids/Electrolytes/Nutrition: Monitor I/O.  -mild hyponatremia, Kidney fxn ok 8. HTN: Monitor BP TID--on metoprolol and Lisinopril             8/6 -pt orthostatic yesterday  -likely volume related as BUN elevated  -continue to push fluids Vitals:   04/07/21 0510 04/07/21 0826  BP: 137/67 108/69  Pulse: 68 76  Resp: 16 20  Temp: 97.9 F (36.6 C) 97.6 F (36.4 C)  SpO2: 99% 97%   t   9. New onset A fib: Monitor HR tid--continue  metoprolol, rate controlled             --On ASA with recs 10. Headaches/neck pain/Myalgias: Ice for neck pain. Low dose topamax for HA. --amantadine for activation.   11. Pre-diabetes: Hgb A1c- 5.8. Educate on CM diet. 12. Dyslipidemia:  Now  on Lipitor for LDL 184. 13. Decreased initiation: started amantadine. 14. 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   15.  Low grade temp: ua negative, ucx pending, afebrile today  LOS: 3 days A FACE TO FACE EVALUATION WAS PERFORMED  Meredith Staggers 04/07/2021, 9:24 AM

## 2021-04-07 NOTE — Plan of Care (Signed)
  Problem: Consults Goal: RH STROKE PATIENT EDUCATION Description: See Patient Education module for education specifics  Outcome: Progressing   Problem: RH BOWEL ELIMINATION Goal: RH STG MANAGE BOWEL WITH ASSISTANCE Description: STG Manage Bowel with mod I Assistance. Outcome: Progressing Goal: RH STG MANAGE BOWEL W/MEDICATION W/ASSISTANCE Description: STG Manage Bowel with Medication with mod I  Assistance. Outcome: Progressing   Problem: RH BLADDER ELIMINATION Goal: RH STG MANAGE BLADDER WITH ASSISTANCE Description: STG Manage Bladder With toileting Assistance Outcome: Progressing   Problem: RH SAFETY Goal: RH STG ADHERE TO SAFETY PRECAUTIONS W/ASSISTANCE/DEVICE Description: STG Adhere to Safety Precautions With cues/reminders Assistance/Device. Outcome: Progressing   Problem: RH PAIN MANAGEMENT Goal: RH STG PAIN MANAGED AT OR BELOW PT'S PAIN GOAL Description: At or below level 4 Outcome: Progressing   Problem: RH KNOWLEDGE DEFICIT Goal: RH STG INCREASE KNOWLEDGE OF DIABETES Description: Patient will be able to manage prediabetes with dietary modifications using handouts and educational resources independently Outcome: Progressing Goal: RH STG INCREASE KNOWLEDGE OF HYPERTENSION Description: Patient will be able to manage HTN with medications and dietary modifications using handouts and educational resources independently Outcome: Progressing Goal: RH STG INCREASE KNOWLEDGE OF DYSPHAGIA/FLUID INTAKE Description: Patient will be able to manage dysphagia,  medications and dietary modifications using handouts and educational resources independently Outcome: Progressing Goal: RH STG INCREASE KNOWLEGDE OF HYPERLIPIDEMIA Description: Patient will be able to manage HLD with medications and dietary modifications using handouts and educational resources independently Outcome: Progressing Goal: RH STG INCREASE KNOWLEDGE OF STROKE PROPHYLAXIS Description: Patient will be able to  manage secondary stroke risks with medications and dietary modifications using handouts and educational resources independently Outcome: Progressing

## 2021-04-07 NOTE — Progress Notes (Signed)
Speech Language Pathology Daily Session Note  Patient Details  Name: Frans Gonnerman MRN: [id number] Date of Birth: [birth year]  Today's Date: 04/07/2021 SLP Missed Time: 82 Minutes Missed Time Reason: Toileting;Nursing care (NT states pt attempting to void, unable. Trying to convince pt for catheter, "not going well" "it's going to be a while".)  Pt missed 45 minutes of tx this date due to nursing care and toileting. Spoke with NT who states pt hasn't voided in some time, was on toilet trying with no success, potential catheter insertion soon. She states "this may take a while". ST f/u x1 and pt still not available. Will cont with ST POC.

## 2021-04-07 NOTE — Progress Notes (Signed)
Occupational Therapy Note  Patient Details  Name: Geoffrey Wells MRN: [id number] Date of Birth: 1946/01/18  Today's Date: 04/07/2021 OT Missed Time: 82 Minutes Missed Time Reason: Unavailable (comment) (see note)  Pt missed 60 min skilled OT treatment due to awaiting placement of catheter. Upon arrival, pt reported he was "about to have a procedure" and was not able to do therapy. OT spoke with RN who informed she had just provided lidocaine in preparation. OT to reach out later if she has availability on her schedule.    Vernette Moise N 04/07/2021, 11:08 AM

## 2021-04-08 ENCOUNTER — Inpatient Hospital Stay (HOSPITAL_COMMUNITY): Payer: No Typology Code available for payment source

## 2021-04-08 MED ORDER — CEPHALEXIN 250 MG PO CAPS
500.0000 mg | ORAL_CAPSULE | Freq: Two times a day (BID) | ORAL | Status: DC
  Administered 2021-04-08 – 2021-04-09 (×3): 500 mg via ORAL
  Filled 2021-04-08 (×4): qty 2

## 2021-04-08 NOTE — Progress Notes (Addendum)
NS with K infusing at 50cc/hr in right hand. Patient reported back pain this afternoon relieved with PRN tylenol. Patient alert and oriented x4 but forgetful. Voided x2 continent in the toilet. This evening with wife present at bedside patient reported seeing "children running in the hall." Reassured patient no children are allowed in the hospital at this time. Continue to reorient as needed. Awaiting final urine culture results.     72 Patient's wife called with questions and concerns regarding patient's hallucinations and previous neurology issues. Wife requesting to speak with MD. MD Naaman Plummer notified.

## 2021-04-08 NOTE — Progress Notes (Addendum)
PROGRESS NOTE   Subjective/Complaints:  Pt in bed. Just awakening. No new complaints today  ROS: Patient denies fever, rash, sore throat, blurred vision, nausea, vomiting, diarrhea, cough, shortness of breath or chest pain, joint or back pain, headache, or mood change.   Objective:   DG Chest 2 View  Result Date: 04/06/2021 CLINICAL DATA:  Fevers EXAM: CHEST - 2 VIEW COMPARISON:  01/28/2020 FINDINGS: Cardiac shadow is mildly enlarged but accentuated by the frontal technique. Hiatal hernia is again identified and stable. Aortic calcifications are seen. Chronic scarring is noted in the right upper lobe but significantly improved when compared with the prior exam. Elevation of the right hemidiaphragm is again noted. No new focal infiltrate is seen. No acute bony abnormality is noted. IMPRESSION: Chronic scarring without acute abnormality. Electronically Signed   By: Inez Catalina M.D.   On: 04/06/2021 20:59   No results for input(s): WBC, HGB, HCT, PLT in the last 72 hours.  Recent Labs    04/06/21 1244  NA 133*  K 3.8  CL 99  CO2 25  GLUCOSE 133*  BUN 27*  CREATININE 1.35*  CALCIUM 9.3    Intake/Output Summary (Last 24 hours) at 04/08/2021 0819 Last data filed at 04/08/2021 0700 Gross per 24 hour  Intake 493.73 ml  Output 350 ml  Net 143.73 ml        Physical Exam: Vital Signs Blood pressure 135/84, pulse 82, temperature 99.7 F (37.6 C), resp. rate 17, height '6\' 1"'$  (1.854 m), weight 94.5 kg, SpO2 97 %.  Constitutional: No distress . Vital signs reviewed. HEENT: NCAT, EOMI, oral membranes moist Neck: supple Cardiovascular: RRR without murmur. No JVD    Respiratory/Chest: CTA Bilaterally without wheezes or rales. Normal effort    GI/Abdomen: BS +, non-tender, non-distended Ext: no clubbing, cyanosis, or edema Psych: pleasant and cooperative  Skin: No evidence of breakdown, no evidence of rash Neurologic: Cranial  nerves II through XII intact, sl dysarthria.  motor strength is 5/5 in RIght and 4/5 left deltoid, bicep, tricep, grip, hip flexor, knee extensors, ankle dorsiflexor and plantar flexor Sensory exam normal sensation to light touch and proprioception in bilateral upper and lower extremities Cerebellar exam normal finger to nose to finger as well as heel to shin in bilateral upper and lower extremities Musculoskeletal: Full range of motion in all 4 extremities. No joint swelling    Assessment/Plan: 1. Functional deficits which require 3+ hours per day of interdisciplinary therapy in a comprehensive inpatient rehab setting. Physiatrist is providing close team supervision and 24 hour management of active medical problems listed below. Physiatrist and rehab team continue to assess barriers to discharge/monitor patient progress toward functional and medical goals  Care Tool:  Bathing              Bathing assist Assist Level: Maximal Assistance - Patient 24 - 49%     Upper Body Dressing/Undressing Upper body dressing        Upper body assist Assist Level: Minimal Assistance - Patient > 75%    Lower Body Dressing/Undressing Lower body dressing            Lower body assist Assist for lower body  dressing: Moderate Assistance - Patient 50 - 74%     Toileting Toileting Toileting Activity did not occur Landscape architect and hygiene only): N/A (no void or bm)  Toileting assist Assist for toileting:  (no void)     Transfers Chair/bed transfer  Transfers assist     Chair/bed transfer assist level: Contact Guard/Touching assist (stand pivot)     Locomotion Ambulation   Ambulation assist      Assist level: Minimal Assistance - Patient > 75% Assistive device: No Device Max distance: 171f   Walk 10 feet activity   Assist     Assist level: Minimal Assistance - Patient > 75% Assistive device: No Device   Walk 50 feet activity   Assist    Assist level:  Minimal Assistance - Patient > 75% Assistive device: No Device    Walk 150 feet activity   Assist Walk 150 feet activity did not occur: Safety/medical concerns (endurance)  Assist level: Minimal Assistance - Patient > 75% Assistive device: No Device    Walk 10 feet on uneven surface  activity   Assist Walk 10 feet on uneven surfaces activity did not occur:  (environmental - not accessible)         Wheelchair     Assist Will patient use wheelchair at discharge?: No             Wheelchair 50 feet with 2 turns activity    Assist            Wheelchair 150 feet activity     Assist          Blood pressure 135/84, pulse 82, temperature 99.7 F (37.6 C), resp. rate 17, height '6\' 1"'$  (1.854 m), weight 94.5 kg, SpO2 97 %.  Medical Problem List and Plan: 1.  Embolic right MCA/PCA infarct             -patient may shower             -ELOS/Goals: 7-10 days S 2.  Antithrombotics: -DVT/anticoagulation:  Mechanical: Sequential compression devices, below knee Bilateral lower extremities. Dopplers negative at admission--monitor for now.             -antiplatelet therapy: ASA 3. Headaches:  tylenol prn. Start topamax '25mg'$  HS 4. Mood: [name] to follow for evaluation and support.             -antipsychotic agents: N/A 5. Neuropsych: This patient may be intermittenly capable of making decisions on his own behalf. 6. Skin/Wound Care: Routine pressure relief measures. 7. Fluids/Electrolytes/Nutrition: Monitor I/O.  -mild hyponatremia, Kidney fxn ok 8. HTN: Monitor BP TID--on metoprolol and Lisinopril             8/6-7 -still orthostatic    -likely volume related as BUN elevated   -pushing fluids, continue NS with K IVF   -BMET tomorrow Vitals:   04/08/21 0251 04/08/21 0436  BP: (!) 146/79 135/84  Pulse: 80 82  Resp: 18 17  Temp: 98.6 F (37 C) 99.7 F (37.6 C)  SpO2: 99% 97%   t   9. New onset A fib: Monitor HR tid--continue metoprolol, rate controlled              --On ASA with recs 10. Headaches/neck pain/Myalgias: Ice for neck pain. Low dose topamax for HA. --amantadine for activation.   11. Pre-diabetes: Hgb A1c- 5.8. Educate on CM diet. 12. Dyslipidemia:  Now on Lipitor for LDL 184. 13. Decreased initiation: started amantadine. 14. 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   15.  Low grade temp: ua negative, ucx with 5k staph lugdunensis and 10k diptheroids   8/7-cx reincubated        -T 99.7 this morning LOS: 4 days A FACE TO FACE EVALUATION WAS PERFORMED  Meredith Staggers 04/08/2021, 8:19 AM

## 2021-04-08 NOTE — Plan of Care (Signed)
  Problem: Consults Goal: RH STROKE PATIENT EDUCATION Description: See Patient Education module for education specifics  Outcome: Progressing   Problem: RH BOWEL ELIMINATION Goal: RH STG MANAGE BOWEL WITH ASSISTANCE Description: STG Manage Bowel with mod I Assistance. Outcome: Progressing Goal: RH STG MANAGE BOWEL W/MEDICATION W/ASSISTANCE Description: STG Manage Bowel with Medication with mod I  Assistance. Outcome: Progressing   Problem: RH BLADDER ELIMINATION Goal: RH STG MANAGE BLADDER WITH ASSISTANCE Description: STG Manage Bladder With toileting Assistance Outcome: Progressing   Problem: RH SAFETY Goal: RH STG ADHERE TO SAFETY PRECAUTIONS W/ASSISTANCE/DEVICE Description: STG Adhere to Safety Precautions With cues/reminders Assistance/Device. Outcome: Progressing   Problem: RH PAIN MANAGEMENT Goal: RH STG PAIN MANAGED AT OR BELOW PT'S PAIN GOAL Description: At or below level 4 Outcome: Progressing   Problem: RH KNOWLEDGE DEFICIT Goal: RH STG INCREASE KNOWLEDGE OF DIABETES Description: Patient will be able to manage prediabetes with dietary modifications using handouts and educational resources independently Outcome: Progressing Goal: RH STG INCREASE KNOWLEDGE OF HYPERTENSION Description: Patient will be able to manage HTN with medications and dietary modifications using handouts and educational resources independently Outcome: Progressing Goal: RH STG INCREASE KNOWLEDGE OF DYSPHAGIA/FLUID INTAKE Description: Patient will be able to manage dysphagia,  medications and dietary modifications using handouts and educational resources independently Outcome: Progressing Goal: RH STG INCREASE KNOWLEGDE OF HYPERLIPIDEMIA Description: Patient will be able to manage HLD with medications and dietary modifications using handouts and educational resources independently Outcome: Progressing Goal: RH STG INCREASE KNOWLEDGE OF STROKE PROPHYLAXIS Description: Patient will be able to  manage secondary stroke risks with medications and dietary modifications using handouts and educational resources independently Outcome: Progressing

## 2021-04-08 NOTE — H&P (Signed)
Pt with more confusion today per wife. Hallucinating at times. I spoke with Mrs. Archambeau, and she's concerned because he's presented with a stroke this same way in the past. I explained to her that he might have a UTI as well. Nursing did not notice a substantial change this afternoon and reports he's had intermittent confusion during admission.   Plan: 1) Empiric keflex 2) CT head without contrast  Meredith Staggers, MD, [facility] 04/08/2021

## 2021-04-09 LAB — URINE CULTURE
Culture: 5000 — AB
Special Requests: NORMAL

## 2021-04-09 LAB — BASIC METABOLIC PANEL
Anion gap: 10 (ref 5–15)
BUN: 12 mg/dL (ref 8–23)
CO2: 18 mmol/L — ABNORMAL LOW (ref 22–32)
Calcium: 8.8 mg/dL — ABNORMAL LOW (ref 8.9–10.3)
Chloride: 106 mmol/L (ref 98–111)
Creatinine, Ser: 1.04 mg/dL (ref 0.61–1.24)
GFR, Estimated: >60 mL/min (ref >60)
Glucose, Bld: 92 mg/dL (ref 70–99)
Potassium: 4.5 mmol/L (ref 3.5–5.1)
Sodium: 134 mmol/L — ABNORMAL LOW (ref 135–145)

## 2021-04-09 LAB — CBC
HCT: 38.8 % — ABNORMAL LOW (ref 39.0–52.0)
Hemoglobin: 12.9 g/dL — ABNORMAL LOW (ref 13.0–17.0)
MCH: 27.7 pg (ref 26.0–34.0)
MCHC: 33.2 g/dL (ref 30.0–36.0)
MCV: 83.3 fL (ref 80.0–100.0)
Platelets: 195 10*3/uL (ref 150–400)
RBC: 4.66 MIL/uL (ref 4.22–5.81)
RDW: 15.6 % — ABNORMAL HIGH (ref 11.5–15.5)
WBC: 7.8 10*3/uL (ref 4.0–10.5)
nRBC: 0 % (ref 0.0–0.2)

## 2021-04-09 MED ORDER — ACETAMINOPHEN 325 MG PO TABS: 325.0000 mg | ORAL_TABLET | ORAL | Status: AC | PRN

## 2021-04-09 NOTE — Progress Notes (Signed)
PROGRESS NOTE   Subjective/Complaints: Reviewed CT results , improving  BMET normal today   ROS: Patient denies fever, rash, sore throat, blurred vision, nausea, vomiting, diarrhea, cough, shortness of breath or chest pain, joint or back pain, headache, or mood change.   Objective:   CT HEAD WO CONTRAST (5MM)  Result Date: 04/08/2021 CLINICAL DATA:  Mental status change, unknown cause. EXAM: CT HEAD WITHOUT CONTRAST TECHNIQUE: Contiguous axial images were obtained from the base of the skull through the vertex without intravenous contrast. COMPARISON:  Head CT and MRI 03/31/2021 FINDINGS: Brain: A right temporal lobe parenchymal hematoma has decreased in size and density with the residual hyperdense component measuring 2.8 x 1.3 x 2.7 cm. There is mild surrounding edema with residual mass effect on the temporal horn of the right lateral ventricle. Small volume subarachnoid hemorrhage on the prior studies has resolved. There are subacute right MCA and right PCA infarcts which demonstrate expected interval evolution since the previous studies. No definite new infarct, new intracranial hemorrhage, midline shift, or extra-axial fluid collection is identified. Patchy hypodensities in the cerebral white matter bilaterally are nonspecific but compatible with moderate chronic small vessel ischemic disease. Mild cerebral atrophy is within normal limits for age. Vascular: Calcified atherosclerosis at the skull base. No hyperdense vessel. Skull: No fracture or suspicious osseous lesion. Sinuses/Orbits: Secretions in a posterior right ethmoid air cell. No significant mastoid fluid. Unremarkable orbits. Other: None. IMPRESSION: 1. Expected interval evolution of right temporal lobe parenchymal hematoma and subacute right MCA and right PCA infarcts. 2. Resolution of small volume subarachnoid hemorrhage. 3. No evidence of new intracranial abnormality. 4. Moderate  chronic small vessel ischemic disease. Electronically Signed   By: Logan Bores M.D.   On: 04/08/2021 19:11   No results for input(s): WBC, HGB, HCT, PLT in the last 72 hours.  Recent Labs    04/06/21 1244 04/09/21 0636  NA 133* 134*  K 3.8 4.5  CL 99 106  CO2 25 18*  GLUCOSE 133* 92  BUN 27* 12  CREATININE 1.35* 1.04  CALCIUM 9.3 8.8*     Intake/Output Summary (Last 24 hours) at 04/09/2021 [id number] Last data filed at 04/09/2021 0700 Gross per 24 hour  Intake 600 ml  Output --  Net 600 ml         Physical Exam: Vital Signs Blood pressure (!) 150/75, pulse 64, temperature 98.2 F (36.8 C), resp. rate 18, height '6\' 1"'$  (1.854 m), weight 94.5 kg, SpO2 99 %.   General: No acute distress Mood and affect are appropriate Heart: Regular rate and rhythm no rubs murmurs or extra sounds Lungs: Clear to auscultation, breathing unlabored, no rales or wheezes Abdomen: Positive bowel sounds, soft nontender to palpation, nondistended Extremities: No clubbing, cyanosis, or edema   Psych: pleasant and cooperative  Skin: No evidence of breakdown, no evidence of rash Neurologic: Cranial nerves II through XII intact, sl dysarthria.  motor strength is 5/5 in RIght and 4/5 left deltoid, bicep, tricep, grip, hip flexor, knee extensors, ankle dorsiflexor and plantar flexor Sensory exam normal sensation to light touch and proprioception in bilateral upper and lower extremities Cerebellar exam normal finger to nose to finger as  well as heel to shin in bilateral upper and lower extremities Musculoskeletal: Full range of motion in all 4 extremities. No joint swelling    Assessment/Plan: 1. Functional deficits which require 3+ hours per day of interdisciplinary therapy in a comprehensive inpatient rehab setting. Physiatrist is providing close team supervision and 24 hour management of active medical problems listed below. Physiatrist and rehab team continue to assess barriers to discharge/monitor  patient progress toward functional and medical goals  Care Tool:  Bathing              Bathing assist Assist Level: Maximal Assistance - Patient 24 - 49%     Upper Body Dressing/Undressing Upper body dressing        Upper body assist Assist Level: Minimal Assistance - Patient > 75%    Lower Body Dressing/Undressing Lower body dressing            Lower body assist Assist for lower body dressing: Moderate Assistance - Patient 50 - 74%     Toileting Toileting Toileting Activity did not occur Landscape architect and hygiene only): N/A (no void or bm)  Toileting assist Assist for toileting:  (no void)     Transfers Chair/bed transfer  Transfers assist     Chair/bed transfer assist level: Contact Guard/Touching assist (stand pivot)     Locomotion Ambulation   Ambulation assist      Assist level: Minimal Assistance - Patient > 75% Assistive device: No Device Max distance: 172f   Walk 10 feet activity   Assist     Assist level: Minimal Assistance - Patient > 75% Assistive device: No Device   Walk 50 feet activity   Assist    Assist level: Minimal Assistance - Patient > 75% Assistive device: No Device    Walk 150 feet activity   Assist Walk 150 feet activity did not occur: Safety/medical concerns (endurance)  Assist level: Minimal Assistance - Patient > 75% Assistive device: No Device    Walk 10 feet on uneven surface  activity   Assist Walk 10 feet on uneven surfaces activity did not occur:  (environmental - not accessible)         Wheelchair     Assist Will patient use wheelchair at discharge?: No             Wheelchair 50 feet with 2 turns activity    Assist            Wheelchair 150 feet activity     Assist          Blood pressure (!) 150/75, pulse 64, temperature 98.2 F (36.8 C), resp. rate 18, height '6\' 1"'$  (1.854 m), weight 94.5 kg, SpO2 99 %.  Medical Problem List and Plan: 1.   Embolic right MCA/PCA infarct             -patient may shower             -ELOS/Goals: 7-10 days S 2.  Antithrombotics: -DVT/anticoagulation:  Mechanical: Sequential compression devices, below knee Bilateral lower extremities. Dopplers negative at admission--monitor for now.             -antiplatelet therapy: ASA 3. Headaches:  tylenol prn. Start topamax '25mg'$  HS 4. Mood: [name] to follow for evaluation and support.             -antipsychotic agents: N/A 5. Neuropsych: This patient may be intermittenly capable of making decisions on his own behalf. 6. Skin/Wound Care: Routine pressure relief measures. 7. Fluids/Electrolytes/Nutrition: Monitor  I/O.  -mild hyponatremia, Kidney fxn ok 8. HTN: Monitor BP TID--on metoprolol and Lisinopril             -likely volume related as BUN elevated   -pushing fluids, may d/c IVF   Vitals:   04/08/21 1929 04/09/21 0332  BP: (!) 148/78 (!) 150/75  Pulse: 65 64  Resp: 18 18  Temp: 98.2 F (36.8 C) 98.2 F (36.8 C)  SpO2: 98% 99%   BPs improved on IVF, no orthostatic drops , po fluids improving   9. New onset A fib: Monitor HR tid--continue metoprolol, rate controlled             --On ASA with recs 10. Headaches/neck pain/Myalgias: Ice for neck pain. Low dose topamax for HA. --amantadine for activation.   11. Pre-diabetes: Hgb A1c- 5.8. Educate on CM diet. 12. Dyslipidemia:  Now on Lipitor for LDL 184. 13. Decreased initiation: started amantadine. 14. 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   15.  Low grade temp: ua negative, ucx with 5k staph lugdunensis and 10k diptheroids   8/7-cx reincubated        Temp normal cont Keflex until cx reincubated result available  LOS: 5 days A FACE TO FACE EVALUATION WAS PERFORMED  Charlett Blake 04/09/2021, 8:08 AM

## 2021-04-09 NOTE — Progress Notes (Signed)
Occupational Therapy Session Note  Patient Details  Name: Geoffrey Wells MRN: [id number] Date of Birth: August 07, 1946  Today's Date: 04/09/2021 OT Individual Time: [id number] OT Individual Time Calculation (min): 73 min    Short Term Goals: Week 1:  OT Short Term Goal 1 (Week 1): Pt will complete LB dressing with CGA. OT Short Term Goal 2 (Week 1): Pt will tolerate bathing UB and LB sitting/standing at shower level with min assist. OT Short Term Goal 3 (Week 1): Pt will toilet and complete toilet transfer with CGA. OT Short Term Goal 4 (Week 1): Pt will brush teeth standing sinkside with close supervision.  Skilled Therapeutic Interventions/Progress Updates:    Pt worked on bathing and dressing to start session.  Min guard for transfer to the EOB with min guard for ambulation to the shower with use of the RW.  Noted pt running the front wheels of the walker on the left side into the doorway with transfer in and out of the bathroom.  He was able to complete undressing with overall min guard but needed mod questioning cueing to remember to remove the gripper sock from the left foot.  He completed bathing sit to stand with min guard using the rails for support.  He declined washing his lower legs and feet except for letting water run on them.  He stood to complete some drying of his upper body with min guard but could not reach down to the lower legs to dry them secondary to increased back pain.  He completed transfer out to the wheelchair at min guard to complete grooming and dressing.  Noted decreased LUE efficiency when trying to grasp his clothing and donn.  Min assist for pulling down the shirt on the left side as well as pulling up his underpants on the left.  Increased time needed for donning his gripper socks as well, but completed with setup.  Ambulated down to the tub/shower room with min assist and no device to practice step in shower transfer.  He reports having no grab bars, however after returning  to the room, his spouse reports having one on the inside wall of the shower.  He was able to complete step over transfer with min guard assist using the grab bars and wall for support.  Discussed need for a small shower seat with pt as well as with spouse once he returned to the room.  She is in agreement with getting him one.  BP taken during session in sitting at 144/83.  He did report lightheadedness after completion of tub transfers in sitting, but it resolved after a couple of mins of rest.  HR in the mid 80's with O2 sats at 99% on room air.  Finished session with pt resting in bed and with the call button and phone in reach and his spouse present.    Therapy Documentation Precautions:  Precautions Precautions: Fall, Other (comment) Precaution Comments: L hemianopsia, SBP goal <140, decreased safety awareness Restrictions Weight Bearing Restrictions: No  Pain: Pain Assessment Pain Scale: Faces Pain Score: 0-No pain Faces Pain Scale: Hurts little more Pain Type: Chronic pain Pain Location: Back Pain Orientation: Lower Pain Descriptors / Indicators: Discomfort Pain Onset: With Activity Pain Intervention(s): Repositioned Multiple Pain Sites: No ADL: See Care Tool Section for some details of mobility and selfcare  Therapy/Group: Individual Therapy  Geoffrey Wells OTR/L 04/09/2021, 12:17 PM

## 2021-04-09 NOTE — Progress Notes (Signed)
Speech Language Pathology Daily Session Note  Patient Details  Name: Nalu Laba MRN: [id number] Date of Birth: 1945-12-03  Today's Date: 04/09/2021 SLP Individual Time: 1345-1430 SLP Individual Time Calculation (min): 45 min  Short Term Goals: Week 1: SLP Short Term Goal 1 (Week 1): Patient will tolerate Dys 3 diet and nectar thick liquids with minimal-to-no overt s/sx of aspiration with use of safe swallow strategies with sup A verbal cues. SLP Short Term Goal 2 (Week 1): Patient will utilize speech intelligibility strategies at conversation level with sup A to achieve 90% intelligibility. SLP Short Term Goal 3 (Week 1): Patient will complete functional problem solving for basic tasks with min-to-mod A verbal cues. SLP Short Term Goal 4 (Week 1): Patient will demonstrate recall of novel information with min-to-mod A verbal cues for use of compensatory strategies. SLP Short Term Goal 5 (Week 1): Patient will demonstrate selective attention during functional tasks in mildly distracting environment with min A verbal cues for redirection  Skilled Therapeutic Interventions: Patient agreeable to skilled ST intervention with focus on cognitive and swallowing goals. Facilitated oral care at sink with sup A for visual scanning. Patient consumed ice chips x3 with what appeared to be timely oral and pharyngeal phase and no overt s/sx of aspiration. Tolerated 3/3 single cup sips of thin water with head turn to left with no overt s/sx of aspiration and clear vocal quality post swallows. Educated patient on effortful swallow maneuver in which he appeared to execute effectively during subsequent swallows. Consumed 4 additional trials of thin water by cup using effortful swallow with one instance of immediate coughing and throat clearing. SLP reinforced safe swallow precautions to patient and spouse including elevating head of bed and completing oral care prior to consumption of ice chips. Facilitated remainder of  session by providing overall moderate A verbal cues for immediate recall. For example, patient recalled 5/10 items without cues, and 10/10 w/ semantic cues after approximately 30 second delay. Required mod-to-max A verbal cues for recall following 8 minute delay. For example, patient recalled 2/10 items without cues and 5/10 items with semantic cues and/or field of 2 choices. Patient was left in bed with alarm activated and immediate needs within reach at end of session. Continue per current plan of care.      Pain Pain Assessment Pain Scale: 0-10 Pain Score: 0-No pain  Therapy/Group: Individual Therapy  Patty Sermons 04/09/2021, 3:49 PM

## 2021-04-09 NOTE — Discharge Instructions (Addendum)
Inpatient Rehab Discharge Instructions  Geoffrey Wells Discharge date and time:    Activities/Precautions/ Functional Status: Activity: no lifting, driving, or strenuous exercise till cleared by MD Diet: soft Wound Care: none needed   Functional status:  ___ No restrictions     ___ Walk up steps independently ___ 24/7 supervision/assistance   ___ Walk up steps with assistance ___ Intermittent supervision/assistance  ___ Bathe/dress independently ___ Walk with walker     ___ Bathe/dress with assistance ___ Walk Independently    ___ Shower independently ___ Walk with assistance    ___ Shower with assistance ___ No alcohol     ___ Return to work/school ________  COMMUNITY REFERRALS UPON DISCHARGE:    Home Health:   PT     OT     ST                      Agency: [facility] (formerly [facility])  Phone: [phone] *[facility] expect follow-up within 2-3 days of discharge to schedule your home visit. If you have not received follow-up, be sure to contact the branch directly.*   Medical Equipment/Items Ordered:tub transfer bench and 3in 1 bedside commode                                                 Agency[facility]Supplier[facility] to [facility] to [facility]    Special Instructions:   STROKE/TIA DISCHARGE INSTRUCTIONS SMOKING Cigarette smoking nearly doubles your risk of having a stroke & is the single most alterable risk factor  If you smoke or have smoked in the last 12 months, you are advised to quit smoking for your health. Most of the excess cardiovascular risk related to smoking disappears within a year of stopping. Ask you doctor about anti-smoking medications [place] Quit Line: 1-800-QUIT NOW Free Smoking Cessation Classes [phone]  CHOLESTEROL Know your levels; limit fat & cholesterol in your diet  Lipid Panel     Component Value Date/Time   CHOL 269 (H) 03/30/2021 2029   TRIG 89 03/30/2021 2029   HDL 67 03/30/2021 2029   CHOLHDL 4.0 03/30/2021 2029   VLDL 18 03/30/2021 2029    [place] 184 (H) 03/30/2021 2029     Many patients benefit from treatment even if their cholesterol is at goal. Goal: Total Cholesterol (CHOL) less than 160 Goal:  Triglycerides (TRIG) less than 150 Goal:  HDL greater than 40 Goal:  LDL (LDLCALC) less than 100   BLOOD PRESSURE American Stroke Association blood pressure target is less that 120/80 mm/Hg  Your discharge blood pressure is:  BP: (!) 103/93 Monitor your blood pressure Limit your salt and alcohol intake Many individuals will require more than one medication for high blood pressure  DIABETES (A1c is a blood sugar average for last 3 months) Goal HGBA1c is under 7% (HBGA1c is blood sugar average for last 3 months)  Diabetes: No known diagnosis of diabetes    Lab Results  Component Value Date   HGBA1C 5.8 (H) 03/30/2021    Your HGBA1c can be lowered with medications, healthy diet, and exercise. Check your blood sugar as directed by your physician Call your physician if you experience unexplained or low blood sugars.  PHYSICAL ACTIVITY/REHABILITATION Goal is 30 minutes at least 4 days per week  Activity: No driving, Therapies: see above Return to work:  Activity decreases  your risk of heart attack and stroke and makes your heart stronger.  It helps control your weight and blood pressure; helps you relax and can improve your mood. Participate in a regular exercise program. Talk with your doctor about the best form of exercise for you (dancing, walking, swimming, cycling).  DIET/WEIGHT Goal is to maintain a healthy weight  Your discharge diet is:  Diet Order             DIET DYS 3 Room service appropriate? Yes with Assist; Fluid consistency: Nectar Thick  Diet effective now                   liquids Your height is:  Height: '6\' 1"'$  (185.4 cm) Your current weight is: Weight: 94.5 kg Your Body Mass Index (BMI) is:  BMI (Calculated): 27.49 Following the type of diet specifically designed for you will help prevent another  stroke. Your goal weight is 189 lbs Your goal Body Mass Index (BMI) is 19-24. Healthy food habits can help reduce 3 risk factors for stroke:  High cholesterol, hypertension, and excess weight.  RESOURCES Stroke/Support Group:  Call [phone]   STROKE EDUCATION PROVIDED/REVIEWED AND GIVEN TO PATIENT Stroke warning signs and symptoms How to activate emergency medical system (call 911). Medications prescribed at discharge. Need for follow-up after discharge. Personal risk factors for stroke. Pneumonia vaccine given:  Flu vaccine given:  My questions have been answered, the writing is legible, and I understand these instructions.  I will adhere to these goals & educational materials that have been provided to me after my discharge from the hospital.         My questions have been answered and I understand these instructions. I will adhere to these goals and the provided educational materials after my discharge from the hospital.  Patient/Caregiver Signature _______________________________ Date __________  Clinician Signature _______________________________________ Date __________  Please bring this form and your medication list with you to all your follow-up doctor's appointments.   My questions have been answered and I understand these instructions. I will adhere to these goals and the provided educational materials after my discharge from the hospital.  Patient/Caregiver Signature _______________________________ Date __________  Clinician Signature _______________________________________ Date __________  Please bring this form and your medication list with you to all your follow-up doctor's appointments.

## 2021-04-09 NOTE — Progress Notes (Signed)
Physical Therapy Session Note  Patient Details  Name: Geoffrey Wells MRN: [id number] Date of Birth: [birth year]  Today's Date: 04/09/2021 PT Individual Time: 1030-1058 PT Individual Time Calculation (min): 28 min   Short Term Goals: Week 1:  PT Short Term Goal 1 (Week 1): Pt will perform supine<>sit with CGA PT Short Term Goal 2 (Week 1): Pt will perform sit<>stands using LRAD with CGA PT Short Term Goal 3 (Week 1): Pt will perform bed<>chair transfers using LRAD with CGA PT Short Term Goal 4 (Week 1): Pt will ambulate at least 122f using LRAD with CGA PT Short Term Goal 5 (Week 1): Pt will ascend/descend 4 steps using R HR with CGA  Skilled Therapeutic Interventions/Progress Updates:     Pt supine in bed to start session with wife at bedside. Pt agreeable without reports of pain. Wife requesting grounds pass to allow transporting pt outside for fresh air - relayed to primary OT to assess safety due to cognition. Pt completed supine<>sit with supervision via self selected log rolling technique. He reports generalized fatigue from taking a shower earlier with OT. Pt required minA for donning shoes for sliding heel into back of shoe while seated EOB. Stand<>pivot transfer with CGA from EOB to w/c and then wheeled to main rehab gym for time. Sit<>stand with CGA and no AD and ambulates ~321f+ ~3068fith CGA and no AD - increased lateral sway and general unsteadiness. Gait distance limited by fatigue. Worked on standing tolerance with task overlay for cognitive puzzle with colored pegs - pt able to stand for 2mi103m sec prior to requesting to sit, contributing to both mental and physical fatigue. Pt required maxA for cognitive problem solving to complete puzzle. Pt returned to room in w/c and requested to return to bed at end of session. Sit<>stand with CGA and short distance ambulatory transfer with CGA to EOB. Sit>supine with supervision. All needs met and bed alarm activated and end of session.   Therapy  Documentation Precautions:  Precautions Precautions: Fall, Other (comment) Precaution Comments: L hemianopsia, SBP goal <140, decreased safety awareness Restrictions Weight Bearing Restrictions: No General:   Therapy/Group: Individual Therapy  Gabreil [place] P Massie Mees 04/09/2021, 8:00 AM

## 2021-04-10 LAB — CBC WITH DIFFERENTIAL/PLATELET
Abs Immature Granulocytes: 0.03 10*3/uL (ref 0.00–0.07)
Basophils Absolute: 0 10*3/uL (ref 0.0–0.1)
Basophils Relative: 0 %
Eosinophils Absolute: 0.1 10*3/uL (ref 0.0–0.5)
Eosinophils Relative: 2 %
HCT: 38.1 % — ABNORMAL LOW (ref 39.0–52.0)
Hemoglobin: 13 g/dL (ref 13.0–17.0)
Immature Granulocytes: 0 %
Lymphocytes Relative: 16 %
Lymphs Abs: 1.3 10*3/uL (ref 0.7–4.0)
MCH: 28.4 pg (ref 26.0–34.0)
MCHC: 34.1 g/dL (ref 30.0–36.0)
MCV: 83.2 fL (ref 80.0–100.0)
Monocytes Absolute: 0.7 10*3/uL (ref 0.1–1.0)
Monocytes Relative: 9 %
Neutro Abs: 5.8 10*3/uL (ref 1.7–7.7)
Neutrophils Relative %: 73 %
Platelets: 189 10*3/uL (ref 150–400)
RBC: 4.58 MIL/uL (ref 4.22–5.81)
RDW: 15.5 % (ref 11.5–15.5)
WBC: 7.9 10*3/uL (ref 4.0–10.5)
nRBC: 0 % (ref 0.0–0.2)

## 2021-04-10 MED ORDER — METHOCARBAMOL 500 MG PO TABS: 500.0000 mg | ORAL_TABLET | Freq: Three times a day (TID) | ORAL | Status: DC | PRN

## 2021-04-10 NOTE — Progress Notes (Signed)
Speech Language Pathology Weekly Progress and Session Note  Patient Details  Name: Geoffrey Wells MRN: [id number] Date of Birth: 1946-05-18  Beginning of progress report period: April 05, 2021 End of progress report period: April 12, 2021  Today's Date: 04/13/2021 SLP Individual Time: 0900-1000 SLP Individual Time Calculation (min): 60 min  Short Term Goals: Week 1: SLP Short Term Goal 1 (Week 1): Patient will tolerate Dys 3 diet and nectar thick liquids with minimal-to-no overt s/sx of aspiration with use of safe swallow strategies with sup A verbal cues. SLP Short Term Goal 1 - Progress (Week 1): Met SLP Short Term Goal 2 (Week 1): Patient will utilize speech intelligibility strategies at conversation level with sup A to achieve 90% intelligibility. SLP Short Term Goal 2 - Progress (Week 1): Met SLP Short Term Goal 3 (Week 1): Patient will complete functional problem solving for basic tasks with min-to-mod A verbal cues. SLP Short Term Goal 3 - Progress (Week 1): Met SLP Short Term Goal 4 (Week 1): Patient will demonstrate recall of novel information with min-to-mod A verbal cues for use of compensatory strategies. SLP Short Term Goal 4 - Progress (Week 1): Not progressing SLP Short Term Goal 5 (Week 1): Patient will demonstrate selective attention during functional tasks in mildly distracting environment with min A verbal cues for redirection SLP Short Term Goal 5 - Progress (Week 1): Met  New Short Term Goals: Week 2: SLP Short Term Goal 1 (Week 2): STG=LTG due to ELOS  Weekly Progress Updates: Patient has met 4 of 5 short term goals this reporting period. Patient has made great progress with ST over the past week due to improved oropharyngeal swallow function, speech intelligibility, problem solving, selective attention, and emerging awareness. Patient is currently completing cognitive-linguistic tasks with min A verbal and visual cues for orientation with use of calendar to orient to  date, problem solving, left visual scanning, and emergent awareness. He requires mod A for immediate and short-term recall with use of compensatory memory strategies. Patient at mod I-to-sup A verbal cues for speech intelligibliity strategies to increase intelligibility to >90% at conversation level. Patient had repeat MBS on 8/11 which revealed improved swallow function as compared to MBS completed on 7/31. Recommendations for Dys 3 diet and thin liquids. Patient/family education is ongoing. Spouse has been present for during scheduled therapy sessions and demonstrates understanding of patient's current needs and limitations. Recommend continued skilled ST intervention to maximize swallow, speech, and cognitive function and functional independence prior to discharge. Continue progression toward established LTGs.    Intensity: Minumum of 1-2 x/day, 30 to 90 minutes Frequency: 3 to 5 out of 7 days Duration/Length of Stay: 8/19 Treatment/Interventions: Cognitive remediation/compensation;Environmental controls;Internal/external aids;Speech/Language facilitation;Cueing hierarchy;Dysphagia/aspiration precaution training;Functional tasks;Patient/family education  Daily Session Skilled Therapeutic Interventions: Patient agreeable to skilled ST intervention with focus on cognitive goals. Patient was sleeping on arrival and easy to arouse. Patient expressed he hasn't seen his wife in 3-4 days despite her visiting him everyday including yesterday. Patient was oriented to all concepts except for the day of week/date. Utilized visual calendar effectively to orient to DOW with additional time for visual scanning. Provided sup A verbal cues for safety awareness and visual scanning during transfer from bed to wheelchair and to bathroom for urine output. Pushed patient in wheelchair to speech office to initiate cognitive-linguistic tasks. Patient soon complained of lightheadedness which was approximately 10 minutes  following transfer to wheelchair. Provided patient with water and took vitals. SPO2: 100, pulse: 72, BP  82/59 with right arm. Taken again 5 minutes later with left arm reading 117/66. Patient endorsed he was unable to concentrate on cognitive tasks due to dizziness. He was taken back to room and remained in wheelchair with his spouse present. Patient denied further dizziness after returning to room. Notified Nurse and OT of blood pressure reading. Patient passed off to OT. Continue per current plan of care.      General    Pain - Patient denied pain   Therapy/Group: Individual Therapy  Patty Sermons 04/13/2021, 4:16 PM

## 2021-04-10 NOTE — Progress Notes (Signed)
PROGRESS NOTE   Subjective/Complaints:  Spoke with pt and wife yest pm regarding CT head result   ROS: Patient denies Objective:   CT HEAD WO CONTRAST (5MM)  Result Date: 04/08/2021 CLINICAL DATA:  Mental status change, unknown cause. EXAM: CT HEAD WITHOUT CONTRAST TECHNIQUE: Contiguous axial images were obtained from the base of the skull through the vertex without intravenous contrast. COMPARISON:  Head CT and MRI 03/31/2021 FINDINGS: Brain: A right temporal lobe parenchymal hematoma has decreased in size and density with the residual hyperdense component measuring 2.8 x 1.3 x 2.7 cm. There is mild surrounding edema with residual mass effect on the temporal horn of the right lateral ventricle. Small volume subarachnoid hemorrhage on the prior studies has resolved. There are subacute right MCA and right PCA infarcts which demonstrate expected interval evolution since the previous studies. No definite new infarct, new intracranial hemorrhage, midline shift, or extra-axial fluid collection is identified. Patchy hypodensities in the cerebral white matter bilaterally are nonspecific but compatible with moderate chronic small vessel ischemic disease. Mild cerebral atrophy is within normal limits for age. Vascular: Calcified atherosclerosis at the skull base. No hyperdense vessel. Skull: No fracture or suspicious osseous lesion. Sinuses/Orbits: Secretions in a posterior right ethmoid air cell. No significant mastoid fluid. Unremarkable orbits. Other: None. IMPRESSION: 1. Expected interval evolution of right temporal lobe parenchymal hematoma and subacute right MCA and right PCA infarcts. 2. Resolution of small volume subarachnoid hemorrhage. 3. No evidence of new intracranial abnormality. 4. Moderate chronic small vessel ischemic disease. Electronically Signed   By: Logan Bores M.D.   On: 04/08/2021 19:11   Recent Labs    04/09/21 0845 04/10/21 0456   WBC 7.8 7.9  HGB 12.9* 13.0  HCT 38.8* 38.1*  PLT 195 189    Recent Labs    04/09/21 0636  NA 134*  K 4.5  CL 106  CO2 18*  GLUCOSE 92  BUN 12  CREATININE 1.04  CALCIUM 8.8*     Intake/Output Summary (Last 24 hours) at 04/10/2021 0734 Last data filed at 04/09/2021 1823 Gross per 24 hour  Intake 360 ml  Output --  Net 360 ml         Physical Exam: Vital Signs Blood pressure 125/70, pulse 68, temperature 98.6 F (37 C), temperature source Oral, resp. rate 16, height '6\' 1"'$  (1.854 m), weight 94.5 kg, SpO2 98 %.    General: No acute distress Mood and affect are appropriate Heart: Regular rate and rhythm no rubs murmurs or extra sounds Lungs: Clear to auscultation, breathing unlabored, no rales or wheezes Abdomen: Positive bowel sounds, soft nontender to palpation, nondistended Extremities: No clubbing, cyanosis, or edema   Psych: pleasant and cooperative  Skin: No evidence of breakdown, no evidence of rash Neurologic: Cranial nerves II through XII intact, sl dysarthria.  motor strength is 5/5 in RIght and 4/5 left deltoid, bicep, tricep, grip, hip flexor, knee extensors, ankle dorsiflexor and plantar flexor Sensory exam normal sensation to light touch and proprioception in bilateral upper and lower extremities Cerebellar exam normal finger to nose to finger as well as heel to shin in bilateral upper and lower extremities Musculoskeletal: Full range of motion  in all 4 extremities. No joint swelling    Assessment/Plan: 1. Functional deficits which require 3+ hours per day of interdisciplinary therapy in a comprehensive inpatient rehab setting. Physiatrist is providing close team supervision and 24 hour management of active medical problems listed below. Physiatrist and rehab team continue to assess barriers to discharge/monitor patient progress toward functional and medical goals  Care Tool:  Bathing    Body parts bathed by patient: Right arm, Left arm, Chest,  Abdomen, Front perineal area, Buttocks, Right upper leg, Left upper leg, Face     Body parts n/a: Left lower leg, Right lower leg (did not attempt)   Bathing assist Assist Level: Maximal Assistance - Patient 24 - 49%     Upper Body Dressing/Undressing Upper body dressing   What is the patient wearing?: Pull over shirt    Upper body assist Assist Level: Minimal Assistance - Patient > 75%    Lower Body Dressing/Undressing Lower body dressing      What is the patient wearing?: Underwear/pull up, Pants     Lower body assist Assist for lower body dressing: Minimal Assistance - Patient > 75%     Toileting Toileting Toileting Activity did not occur (Clothing management and hygiene only): N/A (no void or bm)  Toileting assist Assist for toileting:  (no void)     Transfers Chair/bed transfer  Transfers assist     Chair/bed transfer assist level: Contact Guard/Touching assist (RW)     Locomotion Ambulation   Ambulation assist      Assist level: Minimal Assistance - Patient > 75% Assistive device: No Device Max distance: 100'   Walk 10 feet activity   Assist     Assist level: Minimal Assistance - Patient > 75% Assistive device: No Device   Walk 50 feet activity   Assist    Assist level: Minimal Assistance - Patient > 75% Assistive device: No Device    Walk 150 feet activity   Assist Walk 150 feet activity did not occur: Safety/medical concerns (endurance)  Assist level: Minimal Assistance - Patient > 75% Assistive device: No Device    Walk 10 feet on uneven surface  activity   Assist Walk 10 feet on uneven surfaces activity did not occur:  (environmental - not accessible)         Wheelchair     Assist Will patient use wheelchair at discharge?: No             Wheelchair 50 feet with 2 turns activity    Assist            Wheelchair 150 feet activity     Assist          Blood pressure 125/70, pulse 68,  temperature 98.6 F (37 C), temperature source Oral, resp. rate 16, height '6\' 1"'$  (1.854 m), weight 94.5 kg, SpO2 98 %.  Medical Problem List and Plan: 1.  Embolic right MCA/PCA infarct             -patient may shower             -ELOS/Goals: 7-10 days S 2.  Antithrombotics: -DVT/anticoagulation:  Mechanical: Sequential compression devices, below knee Bilateral lower extremities. Dopplers negative at admission--monitor for now.             -antiplatelet therapy: ASA 3. Headaches:  tylenol prn. Start topamax '25mg'$  HS 4. Mood: [name] to follow for evaluation and support.             -antipsychotic agents:  N/A 5. Neuropsych: This patient may be intermittenly capable of making decisions on his own behalf. 6. Skin/Wound Care: Routine pressure relief measures. 7. Fluids/Electrolytes/Nutrition: Monitor I/O.  -mild hyponatremia, Kidney fxn ok 8. HTN: Monitor BP TID--on metoprolol and Lisinopril             -likely volume related as BUN elevated   -pushing fluids, may d/c IVF   Vitals:   04/09/21 2005 04/10/21 0500  BP: 135/75 125/70  Pulse: 67 68  Resp: 18 16  Temp: 98.6 F (37 C) 98.6 F (37 C)  SpO2: 98% 98%   BPs improved on IVF, no orthostatic drops , po fluids improving   9. New onset A fib: Monitor HR tid--continue metoprolol, rate controlled             --On ASA with recs 10. Headaches/neck pain/Myalgias: Ice for neck pain. Low dose topamax for HA. --amantadine for activation.   11. Pre-diabetes: Hgb A1c- 5.8. Educate on CM diet. 12. Dyslipidemia:  Now on Lipitor for LDL 184. 13. Decreased initiation: started amantadine. 14. 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   15.  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

## 2021-04-10 NOTE — Plan of Care (Signed)
  Problem: Consults Goal: RH STROKE PATIENT EDUCATION Description: See Patient Education module for education specifics  Outcome: Progressing   Problem: RH BOWEL ELIMINATION Goal: RH STG MANAGE BOWEL WITH ASSISTANCE Description: STG Manage Bowel with mod I Assistance. Outcome: Progressing Goal: RH STG MANAGE BOWEL W/MEDICATION W/ASSISTANCE Description: STG Manage Bowel with Medication with mod I  Assistance. Outcome: Progressing   Problem: RH BLADDER ELIMINATION Goal: RH STG MANAGE BLADDER WITH ASSISTANCE Description: STG Manage Bladder With toileting Assistance Outcome: Progressing   Problem: RH SAFETY Goal: RH STG ADHERE TO SAFETY PRECAUTIONS W/ASSISTANCE/DEVICE Description: STG Adhere to Safety Precautions With cues/reminders Assistance/Device. Outcome: Progressing   Problem: RH PAIN MANAGEMENT Goal: RH STG PAIN MANAGED AT OR BELOW PT'S PAIN GOAL Description: At or below level 4 Outcome: Progressing   Problem: RH KNOWLEDGE DEFICIT Goal: RH STG INCREASE KNOWLEDGE OF DIABETES Description: Patient will be able to manage prediabetes with dietary modifications using handouts and educational resources independently Outcome: Progressing Goal: RH STG INCREASE KNOWLEDGE OF HYPERTENSION Description: Patient will be able to manage HTN with medications and dietary modifications using handouts and educational resources independently Outcome: Progressing Goal: RH STG INCREASE KNOWLEDGE OF DYSPHAGIA/FLUID INTAKE Description: Patient will be able to manage dysphagia,  medications and dietary modifications using handouts and educational resources independently Outcome: Progressing Goal: RH STG INCREASE KNOWLEGDE OF HYPERLIPIDEMIA Description: Patient will be able to manage HLD with medications and dietary modifications using handouts and educational resources independently Outcome: Progressing Goal: RH STG INCREASE KNOWLEDGE OF STROKE PROPHYLAXIS Description: Patient will be able to  manage secondary stroke risks with medications and dietary modifications using handouts and educational resources independently Outcome: Progressing

## 2021-04-10 NOTE — Progress Notes (Signed)
Physical Therapy Session Note  Patient Details  Name: Geoffrey Wells MRN: [id number] Date of Birth: 06/06/1946  Today's Date: 04/10/2021 PT Individual Time: 1405-1500 PT Individual Time Calculation (min): 55 min   Short Term Goals: Week 1:  PT Short Term Goal 1 (Week 1): Pt will perform supine<>sit with CGA PT Short Term Goal 2 (Week 1): Pt will perform sit<>stands using LRAD with CGA PT Short Term Goal 3 (Week 1): Pt will perform bed<>chair transfers using LRAD with CGA PT Short Term Goal 4 (Week 1): Pt will ambulate at least 126f using LRAD with CGA PT Short Term Goal 5 (Week 1): Pt will ascend/descend 4 steps using R HR with CGA  Skilled Therapeutic Interventions/Progress Updates:    Pt received R sidelying in bed with his wife, MStanton Kidney present and pt agreeable to therapy session. Assessed vitals during session with RN notified of significant orthostatic hypotension - details below. Supine>sitting R EOB, HOB flat but using bedrail, supervision with cuing for logroll technique due to chronic back pain although pt still with some back discomfort once transitioned to sitting EOB. Requires increased time during session for vital sign assessment and deferred standing activities this session due to low BP (wearing knee high TEDs throughout). Stand pivot EOB>w/c, no AD, with CGA for safety.  Transported to/from gym in w/c for time management and energy conservation. Stand pivot w/c<>Nustep, no AD, with CGA for steadying. Performed B UE and B LE reciprocal movement patterns on Nustep against level 4 resistance for 5 minutes x2 sets (rest break between) totaling 227 steps each bout, targeting endurance. At end of session, pt left seated up in w/c with reinforced education to pt and wife regarding importance of increased fluid intake and increasing time spent OOB during the day to allow pt's body to adjust to upright positioning and gravitational forces.   Supine: BP 120/66 (MAP 82), HR 70bpm  Sitting: BP 93/63  (MAP 72), HR 75bpm  Standing: BP 82/64 (MAP 71), HR 80bpm, SpO2 99%, with pt reports of lightheadedness and unable to remain standing  After 2nd set on NuStep, in sitting : BP 123/79 (MAP 92), HR 78bpm  Standing immediately after: BP 101/63 (MAP 75), HR 85bpm with pt reports of lightheadedness and unable to remain standing  Therapist encouraged fluid intake throughout session with pt consuming 3x4oz of nectar thick liquids.  Therapy Documentation Precautions:  Precautions Precautions: Fall, Other (comment) Precaution Comments: L hemianopsia, SBP goal <140, decreased safety awareness Restrictions Weight Bearing Restrictions: No   Pain: Reports some back discomfort after transition from supine>sitting, unrated, seated rest break and calming strategies for pain management.   Therapy/Group: Individual Therapy  CTawana Scale, PT, DPT, NCS, CSRS  04/10/2021, 12:27 PM

## 2021-04-10 NOTE — Progress Notes (Signed)
Occupational Therapy Session Note  Patient Details  Name: Geoffrey Wells MRN: [id number] Date of Birth: Jan 17, 1946  Today's Date: 04/10/2021 OT Individual Time: 0904-1005 OT Individual Time Calculation (min): 61 min    Short Term Goals: Week 1:  OT Short Term Goal 1 (Week 1): Pt will complete LB dressing with CGA. OT Short Term Goal 2 (Week 1): Pt will tolerate bathing UB and LB sitting/standing at shower level with min assist. OT Short Term Goal 3 (Week 1): Pt will toilet and complete toilet transfer with CGA. OT Short Term Goal 4 (Week 1): Pt will brush teeth standing sinkside with close supervision.  Skilled Therapeutic Interventions/Progress Updates:    Session 1: [phone])  Pt in bed with spouse present to start session.  He was able to transfer to the EOB with close supervision and HOB elevated up to 40 degrees.  He was able to apply his hearing aides with BUEs before ambulating down to the ortho gym with min guard assist using the RW.  Worked initially in standing having him use the LUE to pick up level 1 resistive clothespins to place on horizontal bars.  He exhibited moderate difficulty manipulating the clothespin in the left hand, requiring use of his body to help orient it before placing it with a gross grasp.  He needed seated rest break after standing only approximately 1 minute secondary to fatigue and reported dizziness.  BP taken in sitting with readings at 93/59 and 90/65.  Had him remain in sitting to continue working on task.  He was unable to successfully manipulate the yellow or red clothespins to place on the bar using a 3 jaw chuck pinch or tip to tip.  With opposition testing he was able to oppose to the index and middle fingers partially but not the ring or pinky finger.  Transitioned to having him work on picking up 2" octagonal pieces and working on stacking them with the LUE.  He was able to successfully stack 10 on top of each other but needed several rest breaks secondary to  arm fatigue as well as mod instructional cueing for scanning left of midline to locate the pieces initially.  Utilized wheelchair for transfer back to the room at the end of the session secondary to pt's lower BP.  Completed transfer to the chair as well as to the bed with min assist.  Discussed expectations in the future of trying to stay up after therapies in order to help increase endurance.  Pt left in the bed with spouse present.  Talked with nursing with regards to pt's lower BP at conclusion of session.    Session 2: [phone])  Pt in wheelchair to start with no report of dizziness.  BP taken in sitting at 124/75.  Had pt work on fastening his buttons on his jacket with use of BUEs and especially the LUE.  He was able to complete two buttons with increased time.  Took him down to the ortho gym for use of the [place].  Utilized H[employer]icult[employer], [employer] for evaluation and treatment of left visual field deficit.  He averaged 70% accuracy using the LUE and 4.4-5.3 seconds to locate and press the blue dots.  Slower times of 7-9 seconds were noted in the left upper and lower quadrants with the first set.  On the second set after cueing to use a circular visual scanning pattern, he was able to improve with only 4-5 second average to locate items on the left side.  Finished  with 5 second time limit for each dot with pt completing these with 66% accuracy overall and misses of 13/30 on the left side.  Each interval completed for 2 mins in standing with pt only reporting slight light headedness on the final set, but he was still able to complete the two mins without needing to rest.  Finished session with completion of transfer back to the room via wheelchair and transfer stand pivot to the bed at min assist.  He was able to complete sit to supine with supervision.  Call button and phone in reach with family present and with bed alarm in place.    Therapy Documentation Precautions:  Precautions Precautions: Fall,  Other (comment) Precaution Comments: L hemianopsia, SBP goal <140, decreased safety awareness Restrictions Weight Bearing Restrictions: No  Pain: Pain Assessment Pain Scale: Faces Pain Score: 0-No pain ADL: See Care Tool Section for some details of mobility and selfcare   Therapy/Group: Individual Therapy  Emet Rafanan OTR/L 04/10/2021, 12:23 PM

## 2021-04-10 NOTE — Progress Notes (Signed)
Speech Language Pathology Daily Session Note  Patient Details  Name: Genero Summersett MRN: [id number] Date of Birth: [birth year]  Today's Date: 04/10/2021 SLP Individual Time: [id number] SLP Individual Time Calculation (min): 45 min  Short Term Goals: Week 1: SLP Short Term Goal 1 (Week 1): Patient will tolerate Dys 3 diet and nectar thick liquids with minimal-to-no overt s/sx of aspiration with use of safe swallow strategies with sup A verbal cues. SLP Short Term Goal 2 (Week 1): Patient will utilize speech intelligibility strategies at conversation level with sup A to achieve 90% intelligibility. SLP Short Term Goal 3 (Week 1): Patient will complete functional problem solving for basic tasks with min-to-mod A verbal cues. SLP Short Term Goal 4 (Week 1): Patient will demonstrate recall of novel information with min-to-mod A verbal cues for use of compensatory strategies. SLP Short Term Goal 5 (Week 1): Patient will demonstrate selective attention during functional tasks in mildly distracting environment with min A verbal cues for redirection  Skilled Therapeutic Interventions: Patient agreeable to skilled ST intervention with focus on swallowing and cognitive goals. SLP provided contact guard assist with transfer from bed to wheelchair at start of session with plans to complete therapy session in speech therapy office. Once patient was in wheelchair he complained of feeling light headed. Spouse endorsed fluctuating blood pressure during therapy session earlier today. Vitals were taken and all presenting WFL. Report of light headedness occurred for approximately 5 minutes and patient was agreeable to transfer back to bed for comfort. Patient remained in bed for the remainder of session and denied further symptoms. Blood pressure remained within normal range (120/60). Patient was oriented to month, year and situation, and required verbal cues to refer to visual calendar on wall to orient to day of week/date. SLP  provided set-up assist for oral care while patient completed independently. Facilitated PO trials with ice chips and thin liquid cup sips with no overt s/sx of aspiration noted among all trials. Patient was able to recall effortful swallow maneuver in which he executed during yesterday's session. Patient implemented effortful swallow during PO trials with apparent timely oral and pharyngeal timing. Recommend MBS later this week. Patient's spouse brought him coffee from [place] and requested to thicken to appropriate consistency. SLP provided education on thickening liquids to achieve nectar thick consistency using Simply Thick packet. Provided hands-on demonstration and spoon test. Patient and spouse verbalized understanding via teach back and demonstration. Patient required sup A verbal cues to implement speech intelligibility strategies to achieve 90%+ intelligibility at conversation level this date. Patient was left in bed with alarm activated and immediate needs within reach at end of session. Continue per current plan of care.      Pain Pain Assessment Pain Scale: 0-10 Pain Score: 0-No pain  Therapy/Group: Individual Therapy  Patty Sermons 04/10/2021, 12:23 PM

## 2021-04-11 MED ORDER — LISINOPRIL 5 MG PO TABS: 2.5000 mg | ORAL_TABLET | Freq: Every day | ORAL | Status: DC

## 2021-04-11 MED ORDER — SODIUM CHLORIDE 0.45 % IV SOLN: INTRAVENOUS | Status: DC

## 2021-04-11 NOTE — Patient Care Conference (Addendum)
Inpatient RehabilitationTeam Conference and Plan of Care Update Date: 04/11/2021   Time: 10:39 AM    Patient Name: Geoffrey Wells      Medical Record Number: [id number]  Date of Birth: [birth year] Sex: Male         Room/Bed: 4M03C/4M03C-01 Payor Info: Payor: [employer] / Plan: [place] / Product Type: *No Product type* /    Admit Date/Time:  04/04/2021  3:32 PM  Primary Diagnosis:  Embolic stroke [facility])  Hospital Problems: Principal Problem:   Embolic stroke [facility])    Expected Discharge Date: Expected Discharge Date: 04/20/21  Team Members Present: Physician leading conference: Dr. Alysia Penna Social Worker Present: Erlene Quan, BSW Nurse Present: Dorien Chihuahua, RN PT Present: Page Spiro, PT OT Present: Clyda Greener, OT SLP Present: Sherren Kerns, SLP PPS Coordinator present : Gunnar Fusi, SLP     Current Status/Progress Goal Weekly Team Focus  Bowel/Bladder   Patient is continent of bowel and bladder  Patient will remain continent of bowel and bladder  Will assess qshift and PRN   Swallow/Nutrition/ Hydration   Min A. Dys 3, nectar thick liqids  Mod I, advance to thin liquids.  PO trials with ice chips and thin water with use of compensatory strategies per last MBS recommendations including left head turn and effortful swallow maneuver. MBS planned for Thursday 8/11   ADL's   Supervision for UB bathing with min assist for LB bathing.  Min assist for LB bathing and dressing sit to stand. Left visual field deficit with hypotension at times as well as fatigue limiting sessions.  LUE currently at a gross assist level.  Decreased ability to  manipulate items or complete FM coordination tasks.  supervision overall  selfcare retraining, transfer training, therapeutic activities, vision compensation. DME education, neuromuscular re-education, energy conservation strategies.   Mobility   min assist bed mobility, CGA sit<>stand and stand pivot  transfers without AD, ,min assist gait up to 164f without AD, min assist 4 stairs using HRs - significantly limited participation with therapy session since evaluation due to symptomatic orthostatic hypotension  supervision overall at ambulatory level  activity tolerance, pt/family education, standing balance, gait training, B LE strengthening, L UE NMR, transfer training   Communication   Sup A  Mod I  Speech intelligibility strategies at conversation level   Safety/Cognition/ Behavioral Observations  min A basic, mod-to-max A complex  Min A  orientation and recall with use of strategies, problem solving, left visual scanning, safety awareness   Pain   Patient is currently pain free  Patient will remain pain free  Will assess qshift and PRN   Skin   Patient's skin is intact  Patient's skin will remain intact  Will assess qshift and PRN     Discharge Planning:  Discharging home with family to provide care (spouse and son) 24/7   Team Discussion: Patient admitted post right PCA with visual perceptual issues, delusions and left field deficits. Requires mod cues for circular scanning pattern. Does well communicating basic functional needs. Progress impaired by decreased coordination in hand, poor motor planning and decreased standing endurance with fatigue.  Patient on target to meet rehab goals: yes, currently supervision - min assist needed for upper body bathing. Requires min assist for upper body dressing and lower body care. Completes transfers with min guard assist, stand pivot with CGA and able to ambulate 190' with min assist before however fatigue limits consistency with distance. Discharge goals set for supervision level.  *See Care  Plan and progress notes for long and short-term goals.   Revisions to Treatment Plan:  Working on orientation, problem solving, attention, safety awareness and memory  MBS scheduled for 04/12/21  Teaching Needs: Safety, medications, secondary stroke  risk management, etc  Current Barriers to Discharge: Decreased caregiver support and Home enviroment access/layout  Possible Resolutions to Barriers: Family education with wife and son OP follow up services (PT, OT and SLP)     Medical Summary Current Status: Patient with reduced oral intake of fluids.  Has become hypotensive need to resume IV fluids, reduce lisinopril  Barriers to Discharge: Medical stability   Possible Resolutions to Barriers/Weekly Focus: IV fluids med management, hopefully can upgrade dysphagia restrictions   Continued Need for Acute Rehabilitation Level of Care: The patient requires daily medical management by a physician with specialized training in physical medicine and rehabilitation for the following reasons: Direction of a multidisciplinary physical rehabilitation program to maximize functional independence : Yes Medical management of patient stability for increased activity during participation in an intensive rehabilitation regime.: Yes Analysis of laboratory values and/or radiology reports with any subsequent need for medication adjustment and/or medical intervention. : Yes   I attest that I was present, lead the team conference, and concur with the assessment and plan of the team.   Dorien Chihuahua B 04/11/2021, 1:02 PM

## 2021-04-11 NOTE — Progress Notes (Signed)
Physical Therapy Session Note  Patient Details  Name: Geoffrey Wells MRN: [id number] Date of Birth: [birth year]  Today's Date: 04/11/2021 PT Individual Time: [id number] and [id number] PT Individual Time Calculation (min): 42 min and 32 min  Short Term Goals: Week 1:  PT Short Term Goal 1 (Week 1): Pt will perform supine<>sit with CGA PT Short Term Goal 2 (Week 1): Pt will perform sit<>stands using LRAD with CGA PT Short Term Goal 3 (Week 1): Pt will perform bed<>chair transfers using LRAD with CGA PT Short Term Goal 4 (Week 1): Pt will ambulate at least 1102f using LRAD with CGA PT Short Term Goal 5 (Week 1): Pt will ascend/descend 4 steps using R HR with CGA  Skilled Therapeutic Interventions/Progress Updates:    Session 1: Pt received sitting in w/c having just completed toileting with nursing staff. Pt agreeable to therapy session therefore therapist assumed care of patient. Assessed sitting vitals: BP 94/63 (MAP 70), HR 93bpm. Donned B LE knee high TED hose and shoes total assist for time management. Transported to/from gym in w/c for time management and energy conservation. Sit<>stands, no AD, with CGA for steadying during session. Gait training ~455fx2, no AD, with min assist, vitals assessed after each gait trail - pt demos kyphotic trunk posturing with downward gaze, slow gait speed, decreased B LE step lengths, and slight increased postural sway.   Vitals after 1st gait trial: BP 107/62 (MAP 75), HR 88bpm with pt reporting 8/10 lightheadedness on scale 0-10 and pt having to transition to lying on R side for symptom improvement Vitals after 2nd gait trial: BP 62/45 (MAP 52), HR 91bpm; however, concerned inaccurate reading therefore reassessed, BP 88/57 (MAP 67), HR 90bpm   Transitioned back to room and had RN perform manual BP assessment in sitting: BP 90/60 and then reassessed in standing: BP 88/57 with pt again reporting worsening lightheadedness and needing to return to sitting and then R  sidelying quickly for symptom management.  Thearpist continued to encourage increased fluids (pt consumed at least 6oz of fluid during session). Therapist placed a pair of thigh high TED hose in pt's room to be donned in next therapy session. Pt left R sidelying in bed with needs in reach and bed alarm on. MD made aware of the above Bps during team conference.  Session 2: Pt received asleep, R sidelying in bed and upon awakening pt disoriented thinking it was first thing in the morning - therapist reoriented pt to time of day and situation. Pt agreeable to therapy session though remains lethargic requiring increased time to awaken fully. Assessed vitals throughout session as descried below - pt wearing B LE thigh high TED hose throughout session.  Supine in R arm: BP 157/87 (MAP 105), HR 87bpm  Supine in L  arm: BP 104/81 (MAP 89), HR 87bpm *notified RN of significant vital sign reading difference in UEs (possibly due to dynamap machine?)  Supine>sitting R EOB, HOB slightly elevated and using bedrail, with supervision.   Sitting EOB L arm: BP 113/95 (MAP 103), HR 80bpm   Attempted to sit EOB and reassess vitals in 2 minutes; however, pt reporting worsening dizziness and requests to return to sidelying.  Reassessed vitals: BP 122/98 (MAP 105), HR 95bpm   After R sidelying rest break for symptom dissipation, pt agreeable to return to sitting and transfer to recliner to address improved tolerance to upright positioning. Educated pt on difference between symptoms of "dizziness" indicating decreased tolerance to positional changes from a CNS/vestibular origin  versus "lightheadedness" indicating cardiovascular intolerance to position changes (aka orthostatics). Therapist provided another 4oz cup of nectar thick water which pt consumed.  R stand pivot EOB>recliner, no AD but using UE support on armrests, with CGA. Reassessed vitals in recliner: BP 131/76 (MAP 92), HR 94bpm. Pt left seated in recliner with  needs in reach and seat belt alarm on with no reports of symptoms.  Therapy Documentation Precautions:  Precautions Precautions: Fall, Other (comment) Precaution Comments: L hemianopsia, SBP goal <140, decreased safety awareness Restrictions Weight Bearing Restrictions: No   Pain:  Session 1: At end of session, pt reports headache pain - left resting on side for symptom management.  Session 2: No reports of pain during session.   Therapy/Group: Individual Therapy  Tawana Scale , PT, DPT, NCS, CSRS 04/11/2021, 7:55 AM

## 2021-04-11 NOTE — Plan of Care (Signed)
  Problem: Consults Goal: RH STROKE PATIENT EDUCATION Description: See Patient Education module for education specifics  Outcome: Progressing   Problem: RH BOWEL ELIMINATION Goal: RH STG MANAGE BOWEL WITH ASSISTANCE Description: STG Manage Bowel with mod I Assistance. Outcome: Progressing Goal: RH STG MANAGE BOWEL W/MEDICATION W/ASSISTANCE Description: STG Manage Bowel with Medication with mod I  Assistance. Outcome: Progressing   Problem: RH BLADDER ELIMINATION Goal: RH STG MANAGE BLADDER WITH ASSISTANCE Description: STG Manage Bladder With toileting Assistance Outcome: Progressing   Problem: RH SAFETY Goal: RH STG ADHERE TO SAFETY PRECAUTIONS W/ASSISTANCE/DEVICE Description: STG Adhere to Safety Precautions With cues/reminders Assistance/Device. Outcome: Progressing   Problem: RH PAIN MANAGEMENT Goal: RH STG PAIN MANAGED AT OR BELOW PT'S PAIN GOAL Description: At or below level 4 Outcome: Progressing   Problem: RH KNOWLEDGE DEFICIT Goal: RH STG INCREASE KNOWLEDGE OF DIABETES Description: Patient will be able to manage prediabetes with dietary modifications using handouts and educational resources independently Outcome: Progressing Goal: RH STG INCREASE KNOWLEDGE OF HYPERTENSION Description: Patient will be able to manage HTN with medications and dietary modifications using handouts and educational resources independently Outcome: Progressing Goal: RH STG INCREASE KNOWLEDGE OF DYSPHAGIA/FLUID INTAKE Description: Patient will be able to manage dysphagia,  medications and dietary modifications using handouts and educational resources independently Outcome: Progressing Goal: RH STG INCREASE KNOWLEGDE OF HYPERLIPIDEMIA Description: Patient will be able to manage HLD with medications and dietary modifications using handouts and educational resources independently Outcome: Progressing Goal: RH STG INCREASE KNOWLEDGE OF STROKE PROPHYLAXIS Description: Patient will be able to  manage secondary stroke risks with medications and dietary modifications using handouts and educational resources independently Outcome: Progressing

## 2021-04-11 NOTE — Progress Notes (Signed)
Speech Language Pathology Daily Session Note  Patient Details  Name: Geoffrey Wells MRN: [id number] Date of Birth: [birth year]  Today's Date: 04/11/2021 SLP Individual Time: 0803-0900 SLP Individual Time Calculation (min): 57 min  Short Term Goals: Week 1: SLP Short Term Goal 1 (Week 1): Patient will tolerate Dys 3 diet and nectar thick liquids with minimal-to-no overt s/sx of aspiration with use of safe swallow strategies with sup A verbal cues. SLP Short Term Goal 2 (Week 1): Patient will utilize speech intelligibility strategies at conversation level with sup A to achieve 90% intelligibility. SLP Short Term Goal 3 (Week 1): Patient will complete functional problem solving for basic tasks with min-to-mod A verbal cues. SLP Short Term Goal 4 (Week 1): Patient will demonstrate recall of novel information with min-to-mod A verbal cues for use of compensatory strategies. SLP Short Term Goal 5 (Week 1): Patient will demonstrate selective attention during functional tasks in mildly distracting environment with min A verbal cues for redirection  Skilled Therapeutic Interventions: Patient agreeable to skilled ST intervention with focus on cognitive and dysphagia goals. Patient was received supine in bed. Nurse reported nausea and dizziness this morning with sitting blood pressure measuring 92/60. Upon arrival, patient was consuming nectar thick liquids and reported he was trying to increase hydration. Patient performed oral care with set-up assist. Patient performed self feeding independently with small thin liquid cup sip trials with no overt s/sx of aspiration. Delayed throat clearing noted, although may have attributed to oral care which was performed immediately prior to PO trials. Facilitated working memory and Facilities manager task with sup A verbal cues for recall. Provided training on internal and external memory strategies and facilitated visual memory task with mod A for visual scanning to left, and  immediate recall. Patient continues to require calendar or use of his phone to orient to date. He was able to navigate use of his phone to adjust programming for his hearing aids with additional time for visual processing/L visual scanning. Reviewed speech intelligibility strategies in which patient is currently sup-to-mod I to achieve 90% intelligibility at conversational level. Plan for MBSS tomorrow 04/12/21 at 0900. Patient was left in bed with alarm activated and immediate needs within reach at end of session. Continue per current plan of care.      Pain Pain Assessment Pain Scale: 0-10 Pain Score: 0-No pain  Therapy/Group: Individual Therapy  Patty Sermons 04/11/2021, 8:24 AM

## 2021-04-11 NOTE — Progress Notes (Signed)
Patient ID: Geoffrey Wells, male   DOB: May 06, 1946, 75 y.o.   MRN: [id number] Team Conference Report to Patient/Family  Team Conference discussion was reviewed with the patient and caregiver, including goals, any changes in plan of care and target discharge date.  Patient and caregiver express understanding and are in agreement.  The patient has a target discharge date of 04/20/21.  SW received return phone call from patient son. Provided updates. Son is going to make attempts to encourage patient to improve intake. No additional questions or concern, sw will continue to follow up.     Dyanne Iha 04/11/2021, 1:30 PM

## 2021-04-11 NOTE — Progress Notes (Signed)
Occupational Therapy Session Note  Patient Details  Name: Geoffrey Wells MRN: [id number] Date of Birth: Jan 22, 1946  Today's Date: 04/11/2021 OT Individual Time: 1302-1407 OT Individual Time Calculation (min): 65 min    Short Term Goals: Week 1:  OT Short Term Goal 1 (Week 1): Pt will complete LB dressing with CGA. OT Short Term Goal 2 (Week 1): Pt will tolerate bathing UB and LB sitting/standing at shower level with min assist. OT Short Term Goal 3 (Week 1): Pt will toilet and complete toilet transfer with CGA. OT Short Term Goal 4 (Week 1): Pt will brush teeth standing sinkside with close supervision.  Skilled Therapeutic Interventions/Progress Updates:    Pt worked on bathing and dressing during session.  Began with transition to sitting EOB with supervision using the rails and HOB elevated.  BP at 117/71.  He requested the need to ambulate to the toilet prior to shower, so completed this with min assist and no device.  He was able to manage clothing and complete toilet hygiene with min assist before transferring over to the tub bench.  He was able to complete shower with min assist but needed mod instructional cueing for scanning to the left of midline to locate his washcloth as well as mod instructional cueing for thoroughness.  He began washing his lower body first and had to be cued to use the washcloth as he started pouring soap in the left hand, which mostly poured out onto the floor.  He needed mod instructional cueing to recall the need to wash any of his UB as he did not attempt and was ready to turn the water off, until therapist reminded him.  Transferred out to the sink for dressing with min assist for donning pullover shirt with increased time to orient and min assist for donning underpants and pants with mod assist to correct orientation.  Therapist assisted with donning TEDs and gripper socks secondary to time.  Rest breaks needed throughout session, even when working in sitting.  BP  taken post shower at 102/75 and then again at end of session at 90/63 with pt reporting slight dizziness.  Had him consume some nectar thick cranberry juice before transferring back to the bed to rest.  Min assist for transfer to the EOB stand pivot with supervision for transition sit to supine.  Call button and phone in reach with bed alarm in place.  Pt not oriented to day of the week as he stated "Tuesday" when asked but he was able to state the month.  He was able to state that he was at a hospital in [place] but unable to recall "[facility]".    Therapy Documentation Precautions:  Precautions Precautions: Fall, Other (comment) Precaution Comments: L hemianopsia, SBP goal <140, decreased safety awareness Restrictions Weight Bearing Restrictions: No   Pain: Pain Assessment Pain Scale: Faces Pain Score: 0-No pain ADL: See Care Tool for some details of mobility and selfcare  Therapy/Group: Individual Therapy  Laurieann Friddle OTR/L 04/11/2021, 3:50 PM

## 2021-04-11 NOTE — Progress Notes (Signed)
Pt felt dizzy and nauseous during transfer to toilet. Unable to complete bathroom needs due to dizziness. Sitting BP 92/60. Held morning BP meds. Dr. Letta Pate made aware. Encouraging fluids at this time.   Gerald Stabs, RN

## 2021-04-11 NOTE — Progress Notes (Signed)
Patient ID: Geoffrey Wells, male   DOB: [birth year], 75 y.o.   MRN: [id number]  SW made attempt to call son to provide conference updates, left VM  Erlene Quan, [place]

## 2021-04-11 NOTE — Progress Notes (Addendum)
PROGRESS NOTE   Subjective/Complaints:  Reviewed labs, pt dizzy with standing, feels like his fluid intake has dropped off Spoke with SLP in room who plans MBS , hopeful for upgrade to thin liquids    ROS: Patient denies Objective:   No results found. Recent Labs    04/09/21 0845 04/10/21 0456  WBC 7.8 7.9  HGB 12.9* 13.0  HCT 38.8* 38.1*  PLT 195 189     Recent Labs    04/09/21 0636  NA 134*  K 4.5  CL 106  CO2 18*  GLUCOSE 92  BUN 12  CREATININE 1.04  CALCIUM 8.8*     Intake/Output Summary (Last 24 hours) at 04/11/2021 0748 Last data filed at 04/11/2021 0103 Gross per 24 hour  Intake 600 ml  Output --  Net 600 ml         Physical Exam: Vital Signs Blood pressure 92/60, pulse 81, temperature 98 F (36.7 C), temperature source Oral, resp. rate 16, height '6\' 1"'$  (1.854 m), weight 94.5 kg, SpO2 98 %.  General: No acute distress Mood and affect are appropriate Heart: Regular rate and rhythm no rubs murmurs or extra sounds Lungs: Clear to auscultation, breathing unlabored, no rales or wheezes Abdomen: Positive bowel sounds, soft nontender to palpation, nondistended Extremities: No clubbing, cyanosis, or edema Skin: No evidence of breakdown, no evidence of rash  Psych: pleasant and cooperative  Skin: No evidence of breakdown, no evidence of rash Neurologic: Cranial nerves II through XII intact, sl dysarthria.  motor strength is 5/5 in RIght and 4/5 left deltoid, bicep, tricep, grip, hip flexor, knee extensors, ankle dorsiflexor and plantar flexor Sensory exam normal sensation to light touch and proprioception in bilateral upper and lower extremities Cerebellar exam normal finger to nose to finger as well as heel to shin in bilateral upper and lower extremities Musculoskeletal: Full range of motion in all 4 extremities. No joint swelling    Assessment/Plan: 1. Functional deficits which require 3+  hours per day of interdisciplinary therapy in a comprehensive inpatient rehab setting. Physiatrist is providing close team supervision and 24 hour management of active medical problems listed below. Physiatrist and rehab team continue to assess barriers to discharge/monitor patient progress toward functional and medical goals  Care Tool:  Bathing    Body parts bathed by patient: Right arm, Left arm, Chest, Abdomen, Front perineal area, Buttocks, Right upper leg, Left upper leg, Face     Body parts n/a: Left lower leg, Right lower leg (did not attempt)   Bathing assist Assist Level: Maximal Assistance - Patient 24 - 49%     Upper Body Dressing/Undressing Upper body dressing   What is the patient wearing?: Pull over shirt    Upper body assist Assist Level: Minimal Assistance - Patient > 75%    Lower Body Dressing/Undressing Lower body dressing      What is the patient wearing?: Underwear/pull up, Pants     Lower body assist Assist for lower body dressing: Minimal Assistance - Patient > 75%     Toileting Toileting Toileting Activity did not occur (Clothing management and hygiene only): N/A (no void or bm)  Toileting assist Assist for toileting:  (  no void)     Transfers Chair/bed transfer  Transfers assist     Chair/bed transfer assist level: Contact Guard/Touching assist (stand pivot)     Locomotion Ambulation   Ambulation assist      Assist level: Contact Guard/Touching assist Assistive device: Walker-rolling Max distance: 75'   Walk 10 feet activity   Assist     Assist level: Minimal Assistance - Patient > 75% Assistive device: No Device   Walk 50 feet activity   Assist    Assist level: Minimal Assistance - Patient > 75% Assistive device: No Device    Walk 150 feet activity   Assist Walk 150 feet activity did not occur: Safety/medical concerns (endurance)  Assist level: Minimal Assistance - Patient > 75% Assistive device: No Device     Walk 10 feet on uneven surface  activity   Assist Walk 10 feet on uneven surfaces activity did not occur:  (environmental - not accessible)         Wheelchair     Assist Will patient use wheelchair at discharge?: No             Wheelchair 50 feet with 2 turns activity    Assist            Wheelchair 150 feet activity     Assist          Blood pressure 92/60, pulse 81, temperature 98 F (36.7 C), temperature source Oral, resp. rate 16, height '6\' 1"'$  (1.854 m), weight 94.5 kg, SpO2 98 %.  Medical Problem List and Plan: 1.  Embolic right MCA/PCA infarct             -patient may shower             -ELOS/Goals: 7-10 days S 2.  Antithrombotics: -DVT/anticoagulation:  Mechanical: Sequential compression devices, below knee Bilateral lower extremities. Dopplers negative at admission--monitor for now.             -antiplatelet therapy: ASA 3. Headaches:  tylenol prn. Start topamax '25mg'$  HS 4. Mood: [name] to follow for evaluation and support.             -antipsychotic agents: N/A 5. Neuropsych: This patient may be intermittenly capable of making decisions on his own behalf. 6. Skin/Wound Care: Routine pressure relief measures. 7. Fluids/Electrolytes/Nutrition: Monitor I/O.  -mild hyponatremia, Kidney fxn ok 8. HTN: Monitor BP TID--on metoprolol and Lisinopril             -likely volume related as BUN elevated   -pushing fluids, will give fluid bolus today    Vitals:   04/11/21 0459 04/11/21 0744  BP: 124/81 92/60  Pulse: 89 81  Resp: 16   Temp: 98 F (36.7 C)   SpO2: 98%   Falling behind on fluids again, metoprolol was home med but now on [name] as well   9. New onset A fib: Monitor HR tid--continue metoprolol, rate controlled             --On ASA with recs 10. Headaches/neck pain/Myalgias: Ice for neck pain. Low dose topamax for HA. --amantadine for activation.   11. Pre-diabetes: Hgb A1c- 5.8. Educate on CM diet. 12. Dyslipidemia:  Now on  Lipitor for LDL 184. 13. Decreased initiation: started amantadine. 14. 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   15.  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

## 2021-04-12 ENCOUNTER — Inpatient Hospital Stay (HOSPITAL_COMMUNITY): Payer: No Typology Code available for payment source

## 2021-04-12 LAB — TSH: TSH: 2.065 u[IU]/mL (ref 0.350–4.500)

## 2021-04-12 LAB — T4, FREE: Free T4: 1.3 ng/dL — ABNORMAL HIGH (ref 0.61–1.12)

## 2021-04-12 NOTE — Progress Notes (Addendum)
PROGRESS NOTE   Subjective/Complaints:  Was sweating a lot last noc , has been afebrile   ROS: Patient denies Objective:   No results found. Recent Labs    04/09/21 0845 04/10/21 0456  WBC 7.8 7.9  HGB 12.9* 13.0  HCT 38.8* 38.1*  PLT 195 189     No results for input(s): NA, K, CL, CO2, GLUCOSE, BUN, CREATININE, CALCIUM in the last 72 hours.   Intake/Output Summary (Last 24 hours) at 04/12/2021 0651 Last data filed at 04/12/2021 0257 Gross per 24 hour  Intake 951.52 ml  Output --  Net 951.52 ml         Physical Exam: Vital Signs Blood pressure 121/70, pulse 75, temperature 97.9 F (36.6 C), temperature source Oral, resp. rate 18, height '6\' 1"'$  (1.854 m), weight 94.5 kg, SpO2 96 %.  General: No acute distress Mood and affect are appropriate Heart: Regular rate and rhythm no rubs murmurs or extra sounds Lungs: Clear to auscultation, breathing unlabored, no rales or wheezes Abdomen: Positive bowel sounds, soft nontender to palpation, nondistended Extremities: No clubbing, cyanosis, or edema  Psych: pleasant and cooperative  Skin: No evidence of breakdown, no evidence of rash Neurologic: Cranial nerves II through XII intact, sl dysarthria.  motor strength is 5/5 in RIght and 4/5 left deltoid, bicep, tricep, grip, hip flexor, knee extensors, ankle dorsiflexor and plantar flexor Sensory exam normal sensation to light touch and proprioception in bilateral upper and lower extremities Cerebellar exam normal finger to nose to finger as well as heel to shin in bilateral upper and lower extremities Musculoskeletal: Full range of motion in all 4 extremities. No joint swelling    Assessment/Plan: 1. Functional deficits which require 3+ hours per day of interdisciplinary therapy in a comprehensive inpatient rehab setting. Physiatrist is providing close team supervision and 24 hour management of active medical problems  listed below. Physiatrist and rehab team continue to assess barriers to discharge/monitor patient progress toward functional and medical goals  Care Tool:  Bathing    Body parts bathed by patient: Right arm, Left arm, Chest, Abdomen, Front perineal area, Buttocks, Right upper leg, Left upper leg, Face, Right lower leg, Left lower leg     Body parts n/a: Left lower leg, Right lower leg (did not attempt)   Bathing assist Assist Level: Minimal Assistance - Patient > 75%     Upper Body Dressing/Undressing Upper body dressing   What is the patient wearing?: Pull over shirt    Upper body assist Assist Level: Minimal Assistance - Patient > 75%    Lower Body Dressing/Undressing Lower body dressing      What is the patient wearing?: Underwear/pull up, Pants     Lower body assist Assist for lower body dressing: Minimal Assistance - Patient > 75%     Toileting Toileting Toileting Activity did not occur (Clothing management and hygiene only): N/A (no void or bm)  Toileting assist Assist for toileting: Minimal Assistance - Patient > 75%     Transfers Chair/bed transfer  Transfers assist     Chair/bed transfer assist level: Minimal Assistance - Patient > 75%     Locomotion Ambulation   Ambulation assist  Assist level: Minimal Assistance - Patient > 75% Assistive device: No Device Max distance: 10'   Walk 10 feet activity   Assist     Assist level: Minimal Assistance - Patient > 75% Assistive device: No Device   Walk 50 feet activity   Assist    Assist level: Minimal Assistance - Patient > 75% Assistive device: No Device    Walk 150 feet activity   Assist Walk 150 feet activity did not occur: Safety/medical concerns (endurance)  Assist level: Minimal Assistance - Patient > 75% Assistive device: No Device    Walk 10 feet on uneven surface  activity   Assist Walk 10 feet on uneven surfaces activity did not occur:  (environmental - not  accessible)         Wheelchair     Assist Will patient use wheelchair at discharge?: No             Wheelchair 50 feet with 2 turns activity    Assist            Wheelchair 150 feet activity     Assist          Blood pressure 121/70, pulse 75, temperature 97.9 F (36.6 C), temperature source Oral, resp. rate 18, height '6\' 1"'$  (1.854 m), weight 94.5 kg, SpO2 96 %.  Medical Problem List and Plan: 1.  Embolic right MCA/PCA infarct             -patient may shower             -ELOS/Goals: 7-10 days S 2.  Antithrombotics: -DVT/anticoagulation:  Mechanical: Sequential compression devices, below knee Bilateral lower extremities. Dopplers negative at admission--monitor for now.             -antiplatelet therapy: ASA 3. Headaches:  tylenol prn. Start topamax '25mg'$  HS 4. Mood: [name] to follow for evaluation and support.             -antipsychotic agents: N/A 5. Neuropsych: This patient may be intermittenly capable of making decisions on his own behalf. 6. Skin/Wound Care: Routine pressure relief measures. 7. Fluids/Electrolytes/Nutrition: Monitor I/O.  -mild hyponatremia, Kidney fxn ok 8. HTN: Monitor BP TID--on metoprolol and Lisinopril             -likely volume related as BUN elevated   -pushing fluids, IVF at noc   Vitals:   04/11/21 2005 04/12/21 0411  BP: (!) 147/88 121/70  Pulse: 91 75  Resp: 18 18  Temp: (!) 97.5 F (36.4 C) 97.9 F (36.6 C)  SpO2: 98% 96%  Falling behind on fluids again, metoprolol was home med but now on [name] as well , will d/c [name]  9. New onset A fib: Monitor HR tid--continue metoprolol, rate controlled             --On ASA with recs 10. Headaches/neck pain/Myalgias: Ice for neck pain. Low dose topamax for HA. --amantadine for activation.   11. Pre-diabetes: Hgb A1c- 5.8. Educate on CM diet. 12. Dyslipidemia:  Now on Lipitor for LDL 184. 13. Decreased initiation: started amantadine no clear cut benefit observed ,  given orthostatic hypotension will d/c. 14. 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   15.  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  Sweating without fever check TSH,  T4 LOS: 8 days A FACE TO FACE EVALUATION WAS PERFORMED  Charlett Blake 04/12/2021, 6:51 AM

## 2021-04-12 NOTE — Progress Notes (Signed)
Modified Barium Swallow Progress Note  Patient Details  Name: Geoffrey Wells MRN: [id number] Date of Birth: [birth year]  Today's Date: 04/12/2021  Modified Barium Swallow completed.  Full report located under Chart Review in the Imaging Section.  Brief recommendations include the following:  Clinical Impression  Patient presents with improved swallow function as compared to MBS completed on 7/31. He only exhibited one instance of very trace aspiration when attempting to take barium tablet with thin liquid barium which was sensed but did not appear to transit from trachea (PAS 7). This aspiration event appeared secondary to inability of patient to maintain bolus cohesion with tablet and thin liquid barium, resulting in decreased timing and coordination of swallow. During oral phase, he exhibited a mildly decreased mastication and transit of regular texture solids and although he did request paper towel to wipe his mouth one time, significant anterior spillage of liquid boluses was noted obseved as it had been in previous MBS. During pharyngeal phase, nectar thick liquids resulted in mild vallecular residuals and trace pyriform residuals and thin liquids resulted in mild vallecular and pyriform sinus residuals. Subsequent swallows did aid in eventual clearance of pharyngeal residuals. He exhibited mild-mod vallecular residuals with regular texture solids however with subsequent swallows and sips of thin liquid barium, this reduced to trace to mild. Cricopharyngeal relaxation was decreased leading to slow transit of boluses through UES and upper esophagus, but no backflow or retention of boluses observed in esophagus. SLP did not have patient perform any different postures and head was in neutral position for all boluses tested.   Swallow Evaluation Recommendations       SLP Diet Recommendations: Dysphagia 3 (Mech soft) solids;Thin liquid   Liquid Administration via: Cup;Straw   Medication  Administration: Whole meds with puree   Supervision: Full supervision/cueing for compensatory strategies;Staff to assist with self feeding   Compensations: Minimize environmental distractions;Slow rate;Small sips/bites   Postural Changes: Seated upright at 90 degrees;Remain semi-upright after after feeds/meals (Comment) (20-30)   Oral Care Recommendations: Oral care BID     Sonia Baller, MA, CCC-SLP Speech Therapy

## 2021-04-12 NOTE — Plan of Care (Signed)
  Problem: RH BOWEL ELIMINATION Goal: RH STG MANAGE BOWEL WITH ASSISTANCE Description: STG Manage Bowel with mod I Assistance. Outcome: Not Progressing; LBM 8/8 ; prn laxatives

## 2021-04-12 NOTE — Progress Notes (Signed)
Occupational Therapy Session Note  Patient Details  Name: Geoffrey Wells MRN: [id number] Date of Birth: 13-Apr-1946  Today's Date: 04/12/2021 OT Individual Time: [id number] OT Individual Time Calculation (min): 46 min    Short Term Goals: Week 1:  OT Short Term Goal 1 (Week 1): Pt will complete LB dressing with CGA. OT Short Term Goal 2 (Week 1): Pt will tolerate bathing UB and LB sitting/standing at shower level with min assist. OT Short Term Goal 3 (Week 1): Pt will toilet and complete toilet transfer with CGA. OT Short Term Goal 4 (Week 1): Pt will brush teeth standing sinkside with close supervision.  Skilled Therapeutic Interventions/Progress Updates:    Session 1: [id number])  Pt in recliner to start with spouse present.  BP was taken in sitting at 113/71.  Min assist transfer was completed to the wheelchair and pt was transported down to the ortho gym for work on LUE coordination and visual scanning with use of the [place].  Attempted to complete Visual Scanning Program in standing, however he was only able to tolerate approximately 30 seconds before needing to sit down secondary to light headedness.  BP taken in sitting with 72/49 in first attempt and then 92/56 on the second attempt.  Had him complete 2 min interval of visual scanning at 79% accuracy and an average of 4.9 seconds.  He continues to exhibit increased delay with attempting to locate the dots on the left of the board with an average of 5-7 seconds on the upper and lower quadrants on that side.  Had him complete Therapist, music program with numbers 1-20.  Significant delay in locating the numbers with only 80% accuracy and an overall time of 6:56 to complete.  Average reaction time was at 21 seconds as well.  Finished session with return to the room and pt transferring to the recliner with min assist.  He needed mod facilitation to donn his button up long sleeve shirt/jacket secondary to trying to put the right arm in the left sleeve  opening without awareness.  Safety belt in place with call button and phone in reach.  Spouse also present in room as well.   Session 2: [id number])  Pt in recliner to start with BP at 129/103 and 133/65 using [employer].  He transferred to the wheelchair stand pivot with min assist and was rolled down to the dayroom for session.  Slight increased dizziness reported once he reached the dayroom with BP taken again at 95/76.  Had min work on sitting on RUE coordination to start.  Instructed pt to work on [employer] in rows of three using the LUE only, switching direction with each row.  He took approximately 3-4 mins and could not line up 3 pieces parallel for the first row.  Increased difficulty noted motor planning task.  Transitioned briefly to having him complete clock drawing and house drawing.  Increased difficulty with writing numbers secondary to LUE coordination deficits, but he was also not able to correctly include all the numbers on the clock with only 3 legible numbers listed on the left side.  He was able to state that it did not look right, but was unable to fix it.  He also exhibited significant difficulty with house drawing replication, leaving out multiple details and not being able to draw it near the correct size of the example given.  Finished with return to the stacking task after drawing.  He was able to first place multiple pieces picked up  one at a time with the LUE into the box where they came.  He was then able to stack 5 rows of 3 pieces within approximately 3-4 minutes using the LUE only.  Returned to the room where he transferred to the recliner with min assist.  Call button in reach with spouse present and safety alarm belt in place.  Discussed session with spouse and then showed her his clock and house drawings to exemplify visual spatial deficits.      Therapy Documentation Precautions:  Precautions Precautions: Fall, Other (comment) Precaution Comments: L hemianopsia,  SBP goal <140, decreased safety awareness Restrictions Weight Bearing Restrictions: No Pain: Pain Assessment Pain Scale: Faces Pain Score: 0-No pain ADL: See Care Tool Section for some details of mobility and selfcare  Therapy/Group: Individual Therapy  Shalinda Burkholder OTR/L 04/12/2021, 12:35 PM

## 2021-04-12 NOTE — Progress Notes (Signed)
Physical Therapy Weekly Progress Note  Patient Details  Name: Geoffrey Wells MRN: [id number] Date of Birth: 1945-12-31  Beginning of progress report period: April 05, 2021 End of progress report period: April 12, 2021  Today's Date: 04/12/2021 PT Individual Time: 1006-1101 PT Individual Time Calculation (min): 55 min   Patient has met 4 of 5 short term goals.  Mr. Tuckey is demonstrating slow progress with therapy due to symptomatic orthostatic hypotension impacting pt's ability to participate in upright, OOB therapeutic tasks. He is performing supine<>sit using bed features with CGA, sit<>stands and stand pivot transfers no AD with CGA, and ambulating ~175f without AD and min assist for balance. His tolerance to standing activities continues to remain limited due to BP and pt continues to have significant endurance impairments. He also demonstrates L visual field cuts along with L inattention, impaired awareness, and has had some hallucinations with pt reports of feeling "disoriented." Pt will benefit from continued CIR level therapies to progress towards supervision level LTGs with need for 24hr support upon D/C.   Patient continues to demonstrate the following deficits muscle weakness, decreased cardiorespiratoy endurance, impaired timing and sequencing and unbalanced muscle activation, decreased visual perceptual skills and field cut, decreased attention to left, decreased initiation, decreased attention, decreased awareness, decreased problem solving, decreased memory, and delayed processing, and decreased sitting balance, decreased standing balance, decreased postural control, and decreased balance strategies and therefore will continue to benefit from skilled PT intervention to increase functional independence with mobility.  Patient progressing toward long term goals..  Continue plan of care.  PT Short Term Goals Week 1:  PT Short Term Goal 1 (Week 1): Pt will perform supine<>sit with CGA PT  Short Term Goal 1 - Progress (Week 1): Met PT Short Term Goal 2 (Week 1): Pt will perform sit<>stands using LRAD with CGA PT Short Term Goal 2 - Progress (Week 1): Met PT Short Term Goal 3 (Week 1): Pt will perform bed<>chair transfers using LRAD with CGA PT Short Term Goal 3 - Progress (Week 1): Met PT Short Term Goal 4 (Week 1): Pt will ambulate at least 1039fusing LRAD with CGA PT Short Term Goal 4 - Progress (Week 1): Progressing toward goal PT Short Term Goal 5 (Week 1): Pt will ascend/descend 4 steps using R HR with CGA PT Short Term Goal 5 - Progress (Week 1): Met Week 2:  PT Short Term Goal 1 (Week 2): = to LTGs based on ELOS  Skilled Therapeutic Interventions/Progress Updates:  Ambulation/gait training;Community reintegration;DME/adaptive equipment instruction;Neuromuscular re-education;Psychosocial support;Stair training;UE/LE Strength taining/ROM;Balance/vestibular training;Discharge planning;Functional electrical stimulation;Pain management;Skin care/wound management;Therapeutic Activities;UE/LE Coordination activities;Cognitive remediation/compensation;Disease management/prevention;Functional mobility training;Patient/family education;Splinting/orthotics;Visual/perceptual remediation/compensation;Therapeutic Exercise  Pt received in supported sitting in bed with his wife, MaStanton Kidneypresent. Pt agreeable to therapy session. Supine>sitting R EOB, HOB elevated and using bedrails as needed with supervision. Donned B LE thigh high TED hose max assist for time management. Sitting vitals: BP 138/69 (MAP 90), HR 69bpm Reports need to use bathroom. Gait training ~1530fn/out bathroom, no AD, with CGA/light min assist for balance. Standing performed LB clothing management without assist - continent of bladder. Standing at sink completed hand hygiene with CGA for balance safety.  Transported to/from gym in w/c for time management and energy conservation. Gait training ~19f60f, no AD, with CGA/light min  assist for balance - continues to have excessive trunk kyphosis with downward gaze, increased postural sway/path deviation, decreased B LE step lengths and foot clearances with a slightly more shuffled pattern. Stair navigation  ascending/descending 8 steps using B HRs with reciprocal stepping pattern and CGA for balance - demos slower speed of movement and continued excessive kyphotic posturing.  Pt with more noticeable L inattention and increased disorientation with pt having 1x hallucination during session, but when questioned pt oriented to location and situation. Performed standing L attention and visual scanning task of card matching with pt having increased difficulty in standing due to dual-task challenge - requires mod/max cuing to locate cards on L side of board. Pt requires frequent seated rest break during this task with pt reporting worsening dizziness - vitals: BP 106/77 (MAP 85), HR 68bpm Provided seated rest break while completing L attention task.   Gait training ~2f, no AD, with same assist as provided above with pt demonstrating increasing fatigue towards end of walk with even slower more shuffled steps. Transported back to room. L stand pivot to recliner, no AD, with CGA. Pt left seated in recliner with needs in reach and seat belt alarm on in anticipation of next therapy session.  Therapy Documentation Precautions:  Precautions Precautions: Fall, Other (comment) Precaution Comments: L hemianopsia, SBP goal <140, decreased safety awareness Restrictions Weight Bearing Restrictions: No   Pain: Reports some low back discomfort from lying in the bed - performed OOB mobility for pain management.   Therapy/Group: Individual Therapy  CTawana Scale, PT, DPT, NCS, CSRS 04/12/2021, 7:57 AM

## 2021-04-13 NOTE — Progress Notes (Signed)
Inpatient Rehabilitation Care Coordinator Discharge Note  The overall goal for the admission was met for:   Discharge location: Yes, home  Length of Stay: Yes, 16 Days  Discharge activity level: Yes. Supervision.   Home/community participation: Yes. Limited.  Services provided included: MD, RD, PT, OT, SLP, RN, CM, TR, Pharmacy, Neuropsych, and SW  Financial Services: Other: [place] and Medicare Part A  Choices offered to/list presented to: pt and spouse  Follow-up services arranged: Home Health: [facility] for HHPT/OT/SLP  Comments (or additional information): PT OT ST AIDE  Transfer Bench, Bedside Commode  Patient/Family verbalized understanding of follow-up arrangements: Yes  Individual responsible for coordination of the follow-up plan: [name] [phone]  Confirmed correct DME delivered: Dyanne Iha 04/13/2021    Dyanne Iha

## 2021-04-13 NOTE — Progress Notes (Signed)
Patient ID: Geoffrey Wells, male   DOB: [birth year], 75 y.o.   MRN: [id number]  [facility] and [facility]TB orders through [place], [place]

## 2021-04-13 NOTE — Progress Notes (Signed)
Physical Therapy Session Note  Patient Details  Name: Geoffrey Wells MRN: [id number] Date of Birth: 08/14/1946  Today's Date: 04/13/2021 PT Individual Time: 1501-1600 PT Individual Time Calculation (min): 59 min   Short Term Goals: Week 2:  PT Short Term Goal 1 (Week 2): = to LTGs based on ELOS  Skilled Therapeutic Interventions/Progress Updates:  Patient seated upright in w/c on entrance to room. Wife and daughter present. Patient alert and agreeable to PT session. Patient denied pain during session.  Therapeutic Activity: Transfers: Patient performed STS and SPVT transfers throughout session with CGA/ Min A for power up. Provided verbal cues for pushing from armrests to assist with power up as well as to reach back to armrests prior to descent to sit in order to correctly position self. Pt relates understanding.  Gait Training:  Patient ambulated 130 feet using RW with CGA/ Min A for balance.  Demonstrated NBOS and intermittent increases in sway requiring adjustment in balance to maintain upright posture. Provided vc/ tc for proximity to walker, upright stance, level gaze, increasing BOS.  Neuromuscular Re-ed: NMR facilitated during session with focus on muscle recruitment and activation as well as with increasing motor control. Pt guided in use of NuStep for above reasoning as well as to improve cardiorespiratory response to activity and general strengthening. Pt guided initially in use of BUE and BLE to promote smooth reciprocation at L4 x22mn. Pt maintains 2.0 METs at 35-40 steps/ min. Brief rest break followed by 132m with BLE only with instructions not to "bottom out" each pedal stroke but instead to continue smooth reciprocation for improving motor control. Pt demos improvement and is able to maintain 2.5 METs at 41 steps/ min. Noted improved IR of LLE during 2nd bout as well.   Pt also guided in standing reach challenge with items outside BOS placed anterior to pt. Pt hesitant at first  and unable to reach further targets, however with practice and build of self-efficacy, pt able to reach all targets at max reach without moving feet. Good ankle and hip strategy noted. Progressed to dynamic stepping with reach and demos continued good balance with no UE support.   Next pt provided with 6" step to perform reciprocating toe touches to step. Initially with BUE support and performs well. Then walker removed and pt able to perform more slowly 2x with each foot until he caught his toe on step and lost confidence despite not losing balance and completing toe touch prior to placing foot on ground. NMR performed for improvements in motor control and coordination, balance, sequencing, judgement, and self confidence/ efficacy in performing all aspects of mobility at highest level of independence.   Patient seated upright  in w/c  at end of session with brakes locked, belt alarm set, and all needs within reach.     Therapy Documentation Precautions:  Precautions Precautions: Fall, Other (comment) Precaution Comments: L hemianopsia, SBP goal <140, decreased safety awareness Restrictions Weight Bearing Restrictions: No  Therapy/Group: Individual Therapy  JuAlger SimonsT, DPT 04/13/2021, 6:54 PM

## 2021-04-13 NOTE — Progress Notes (Signed)
Patient ID: Geoffrey Wells, male   DOB: 1945/10/31, 75 y.o.   MRN: [id number]  Family prefers [place] vs OP due to transportation. SW will provide [facility] list

## 2021-04-13 NOTE — Progress Notes (Signed)
Patient ID: Geoffrey Wells, male   DOB: 1946-06-07, 76 y.o.   MRN: [id number]  OP referral faxed to [facility], [place]

## 2021-04-13 NOTE — Progress Notes (Signed)
Occupational Therapy Weekly Progress Note  Patient Details  Name: Geoffrey Wells MRN: [id number] Date of Birth: 1946/07/26  Beginning of progress report period: April 05, 2021 End of progress report period: April 13, 2021  Today's Date: 04/13/2021 OT Individual Time: 1004-1106 OT Individual Time Calculation (min): 62 min    Patient has met 1 of 4 short term goals.  Mr. Meinhardt is making steady progress with OT at this time.  He is able to complete all bathing sit to stand with min guard assist sit to stand, with dressing at min assist sit to stand.  He continues to exhibit slower cognitive processing and organization when trying to complete ADL tasks, requiring min to [employer] cueing for sequencing and techniques.  Left visual field deficit is still present with mod instructional cueing for compensation with eye and head turns to the left to locate selfcare items.  Left perceptual deficits are also present resulting in decreased ability to successfully locate parts of clothing with disorientation noted when trying to donn his shirt, pants, and underpants.  Endurance continues to be limited with multiple rest breaks needed to complete ADL tasks as well as still exhibiting orthostatic hypotension which limits his ability to tolerate and complete standing tasks.  LUE function is currently at a diminished level for selfcare tasks with decreased FM coordination noted during buttoning or with functional use when attempting to pull up clothing.  He is able to oppose his thumb to the first two digits but not the last couple.  Decreased motor planning is noted as well when attempting to manipulate items in hand.  Feel overall his progress has been slower than expected based on medical limitations but overall he is on target to meet supervision level goals with expected discharge on 8/19.  Recommend continued CIR level therapy to address these deficits in preparation for discharge home with his  spouse and 24 hr supervision.    Patient continues to demonstrate the following deficits: muscle weakness and muscle paralysis, impaired timing and sequencing, unbalanced muscle activation, and decreased coordination, decreased visual perceptual skills, field cut, and hemianopsia, decreased attention to left, decreased awareness, decreased problem solving, decreased memory, and delayed processing, and decreased sitting balance, decreased standing balance, decreased postural control, hemiplegia, and decreased balance strategies and therefore will continue to benefit from skilled OT intervention to enhance overall performance with BADL and Reduce care partner burden.  Patient progressing toward long term goals..  Continue plan of care.  OT Short Term Goals Week 2:  OT Short Term Goal 1 (Week 2): Continue working on established LTGs set at supervision level overall.  Skilled Therapeutic Interventions/Progress Updates:    Session 1: (1004-1106)  Pt in wheelchair to start with BP taken at 114/72 in the right arm and 112/67 in the left arm.  He was able to ambulate to the walk-in shower with min assist and no device.  No report of light headedness with this.  He then removed all clothing sit to stand with min guard using the grab bars for support from the tub bench.  Bathing was completed at min guard assist as well, but pt continues to need min instructional cueing for thoroughness and completion.  After washing his head and face, he stated "OK I think that's got it" without any attempt to wash any other parts until therapist cued him.  Once shower was complete he transferred over to the wheelchair where he worked on dressing.  Increased fatigue noted throughout with pt stopping  to rest while completing parts of donning his shirt or pants.  Mod demonstrational cueing and min assist needed for correct orientation of clothing as pt with decreased emergent awareness that his shirt was being applied backwards.   Noted pt takes a much longer period of time when attempting to orient all clothing secondary to visual perceptual deficits and visual field deficit.  Min assist for donning underpants and pants with total assist for TEDs.  He was able to donn his slip on shoes to complete dressing with setup.  He wanted to transfer to the regular bedside chair to rest so this was completed at min assist level with mod instructional cueing to turn all the way to the chair and not to sit down too early.    Session 2: [phone])  Pt in bathroom to start session working on toileting   He was successful with BM with work on Prob[employer] and toilet hygiene at min assist level.  Pt used the LUE for hygiene, exhibiting decreased efficiency but he did not want to try and use the right hand as he stated it would be too awkward.  Noted pt at times getting stool on his left hand, which therapist provided washcloths to wipe it off and then he washed his hands thoroughly at the sink.  He completed clothing management with min assist and then transferred to the wheelchair.  Had him work on standing at the sink for oral hygiene with min instructional cueing to locate his toothbrush and toothpaste on the right side of the sink.  He was able to complete task with min guard standing for over two mins with reports of fatigue, but no dizziness.  LUE was used primarily for brushing with increased time and some decreased ability to manipulate the toothbrush efficiently when attempting to reposition in the hand or rotate.  Finished session with pt sitting in the wheelchair with the safety alarm belt in place and with the call button and phone in reach.      Therapy Documentation Precautions:  Precautions Precautions: Fall, Other (comment) Precaution Comments: L hemianopsia, SBP goal <140, decreased safety awareness Restrictions Weight Bearing Restrictions: No  Pain: Pain Assessment Pain Scale: Faces Pain Score: 0-No pain ADL: See  Care Tool Section for some details of mobility and selfcare  Therapy/Group: Individual Therapy  Marialena Wollen OTR/L 04/13/2021, 12:32 PM

## 2021-04-13 NOTE — Progress Notes (Signed)
PROGRESS NOTE   Subjective/Complaints: Sleepy this morning- no complaints T4 is elevated- will curbside medicine regarding significance of this  Vitals stable  ROS: +sweating.    Objective:   DG Swallowing Func-Speech Pathology  Result Date: 04/12/2021 Table formatting from the original result was not included. Objective Swallowing Evaluation: Type of Study: MBS-Modified Barium Swallow Study  Patient Details Name: Aelred Carby MRN: [id number] Date of Birth: December 04, 1945 Today's Date: 04/12/2021 Time: SLP Start Time (ACUTE ONLY): 1340 -SLP Stop Time (ACUTE ONLY): 1354 SLP Time Calculation (min) (ACUTE ONLY): 14 min Past Medical History: Past Medical History: Diagnosis Date  Dysphagia   due to radiation/weakness  Long COVID?   fatigue and "foggy brain"  Pneumonia due to COVID-19 virus 01/10/2020  hospitalization X 2  Pulmonary emboli (HCC)   post Covid  Throat cancer [facility])   Radiotactic surgery Past Surgical History: Past Surgical History: Procedure Laterality Date  HERNIA REPAIR   HPI: 75 yo male s/p admitted to [facility] with L hand weakness and L facial droop.  He is  s/p tPA with worsening of symptoms  post admistration.  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 Received reversal medication. PMHx: COVID PNA with ongoing issues with SHOB.  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  Patient reports seeing ST for services at the [facility] to address swallowing defiicts post treatment.  Subjective: pt says he's feeling better Assessment / Plan / Recommendation CHL IP CLINICAL IMPRESSIONS 04/12/2021 Clinical Impression Patient presents with improved swallow function as compared to MBS completed on 7/31. He only exhibited one instance of very trace aspiration when attempting to take barium tablet with thin  liquid barium which was sensed but did not appear to transit from trachea (PAS 7). This aspiration event appeared secondary to inability of patient to maintain bolus cohesion with tablet and thin liquid barium, resulting in decreased timing and coordination of swallow. During oral phase, he exhibited a mildly decreased mastication and transit of regular texture solids and although he did request paper towel to wipe his mouth one time, significant anterior spillage of liquid boluses was noted obseved as it had been in previous MBS. During pharyngeal phase, nectar thick liquids resulted in mild vallecular residuals and trace pyriform residuals and thin liquids resulted in mild vallecular and pyriform sinus residuals. Subsequent swallows did aid in eventual clearance of pharyngeal residuals. He exhibited mild-mod vallecular residuals with regular texture solids however with subsequent swallows and sips of thin liquid barium, this reduced to trace to mild. Cricopharyngeal relaxation was decreased leading to slow transit of boluses through UES and upper esophagus, but no backflow or retention of boluses observed in esophagus. SLP did not have patient perform any different postures and head was in neutral position for all boluses tested. SLP Visit Diagnosis Dysphagia, oropharyngeal phase (R13.12) Attention and concentration deficit following -- Frontal lobe and executive function deficit following -- Impact on safety and function Mild aspiration risk   CHL IP TREATMENT RECOMMENDATION 04/01/2021 Treatment Recommendations Therapy as outlined in treatment plan below  Prognosis 04/01/2021 Prognosis for Safe Diet Advancement Good Barriers to Reach Goals Cognitive deficits Barriers/Prognosis Comment -- CHL IP DIET RECOMMENDATION 04/12/2021 SLP Diet Recommendations Dysphagia 3 (Mech soft) solids;Thin liquid Liquid Administration via Cup;Straw Medication Administration Whole meds with puree Compensations Minimize environmental  distractions;Slow rate;Small sips/bites Postural Changes Seated upright at 90 degrees;Remain semi-upright after after feeds/meals (Comment)   CHL IP OTHER RECOMMENDATIONS 04/12/2021 Recommended Consults -- Oral Care Recommendations Oral care BID Other Recommendations --   CHL IP FOLLOW UP RECOMMENDATIONS 04/02/2021 Follow up Recommendations Inpatient Rehab   CHL IP FREQUENCY AND DURATION 04/02/2021 Speech Therapy Frequency (ACUTE ONLY) min 2x/week Treatment Duration --      CHL IP ORAL PHASE 04/12/2021 Oral Phase Impaired Oral - Pudding Teaspoon -- Oral - Pudding Cup -- Oral - Honey Teaspoon -- Oral - Honey Cup -- Oral - Nectar Teaspoon -- Oral - Nectar Cup WFL Oral - Nectar Straw NT Oral - Thin Teaspoon -- Oral - Thin Cup WFL Oral - Thin Straw WFL Oral - Puree WFL Oral - Mech Soft -- Oral - Regular Delayed oral transit Oral - Multi-Consistency -- Oral - Pill Decreased bolus cohesion Oral Phase - Comment --  CHL IP PHARYNGEAL PHASE 04/12/2021 Pharyngeal Phase Impaired Pharyngeal- Pudding Teaspoon -- Pharyngeal -- Pharyngeal- Pudding Cup -- Pharyngeal -- Pharyngeal- Honey Teaspoon -- Pharyngeal -- Pharyngeal- Honey Cup -- Pharyngeal -- Pharyngeal- Nectar Teaspoon -- Pharyngeal -- Pharyngeal- Nectar Cup Pharyngeal residue - valleculae;Pharyngeal residue - pyriform Pharyngeal -- Pharyngeal- Nectar Straw -- Pharyngeal -- Pharyngeal- Thin Teaspoon -- Pharyngeal -- Pharyngeal- Thin Cup Penetration/Aspiration during swallow;Pharyngeal residue - valleculae;Pharyngeal residue - pyriform;Trace aspiration Pharyngeal Material enters airway, passes BELOW cords and not ejected out despite cough attempt by patient Pharyngeal- Thin Straw Pharyngeal residue - valleculae;Pharyngeal residue - pyriform Pharyngeal Material does not enter airway Pharyngeal- Puree Pharyngeal residue - valleculae Pharyngeal -- Pharyngeal- Mechanical Soft -- Pharyngeal -- Pharyngeal- Regular Pharyngeal residue - valleculae;Pharyngeal residue - pyriform;Reduced  tongue base retraction Pharyngeal -- Pharyngeal- Multi-consistency -- Pharyngeal -- Pharyngeal- Pill Delayed swallow initiation-vallecula Pharyngeal -- Pharyngeal Comment --  CHL IP CERVICAL ESOPHAGEAL PHASE 04/12/2021 Cervical Esophageal Phase Impaired Pudding Teaspoon -- Pudding Cup -- Honey Teaspoon -- Honey Cup -- Nectar Teaspoon -- Nectar Cup Reduced cricopharyngeal relaxation Nectar Straw Reduced cricopharyngeal relaxation Thin Teaspoon -- Thin Cup Reduced cricopharyngeal relaxation Thin Straw Reduced cricopharyngeal relaxation Puree Reduced cricopharyngeal relaxation Mechanical Soft -- Regular Reduced cricopharyngeal relaxation Multi-consistency -- Pill Reduced cricopharyngeal relaxation Cervical Esophageal Comment -- Sonia Baller, MA, CCC-SLP Speech Therapy             No results for input(s): WBC, HGB, HCT, PLT in the last 72 hours.  No results for input(s): NA, K, CL, CO2, GLUCOSE, BUN, CREATININE, CALCIUM in the last 72 hours.   Intake/Output Summary (Last 24 hours) at 04/13/2021 0856 Last data filed at 04/13/2021 0222 Gross per 24 hour  Intake 1113.2 ml  Output 225 ml  Net 888.2 ml        Physical Exam: Vital Signs Blood pressure 133/78, pulse 84, temperature 98 F (36.7 C), resp. rate 18, height '6\' 1"'$  (1.854 m), weight 94.5 kg, SpO2 99 %. Gen: no distress, normal appearing HEENT: oral mucosa pink and moist, NCAT Cardio: Reg rate Chest: normal effort, normal rate of breathing Abd: soft, non-distended  Psych: pleasant and cooperative  Skin: No evidence of breakdown, no evidence of rash Neurologic: Cranial nerves II through XII intact, sl dysarthria.  motor strength is 5/5 in RIght and 4/5  left deltoid, bicep, tricep, grip, hip flexor, knee extensors, ankle dorsiflexor and plantar flexor Sensory exam normal sensation to light touch and proprioception in bilateral upper and lower extremities Cerebellar exam normal finger to nose to finger as well as heel to shin in bilateral  upper and lower extremities Musculoskeletal: Full range of motion in all 4 extremities. No joint swelling    Assessment/Plan: 1. Functional deficits which require 3+ hours per day of interdisciplinary therapy in a comprehensive inpatient rehab setting. Physiatrist is providing close team supervision and 24 hour management of active medical problems listed below. Physiatrist and rehab team continue to assess barriers to discharge/monitor patient progress toward functional and medical goals  Care Tool:  Bathing    Body parts bathed by patient: Right arm, Left arm, Chest, Abdomen, Front perineal area, Buttocks, Right upper leg, Left upper leg, Face, Right lower leg, Left lower leg     Body parts n/a: Left lower leg, Right lower leg (did not attempt)   Bathing assist Assist Level: Minimal Assistance - Patient > 75%     Upper Body Dressing/Undressing Upper body dressing   What is the patient wearing?: Pull over shirt    Upper body assist Assist Level: Minimal Assistance - Patient > 75%    Lower Body Dressing/Undressing Lower body dressing      What is the patient wearing?: Underwear/pull up, Pants     Lower body assist Assist for lower body dressing: Minimal Assistance - Patient > 75%     Toileting Toileting Toileting Activity did not occur (Clothing management and hygiene only): N/A (no void or bm)  Toileting assist Assist for toileting: Minimal Assistance - Patient > 75%     Transfers Chair/bed transfer  Transfers assist     Chair/bed transfer assist level: Contact Guard/Touching assist     Locomotion Ambulation   Ambulation assist      Assist level: Minimal Assistance - Patient > 75% Assistive device: No Device Max distance: 37f   Walk 10 feet activity   Assist     Assist level: Minimal Assistance - Patient > 75% Assistive device: No Device   Walk 50 feet activity   Assist    Assist level: Minimal Assistance - Patient > 75% Assistive  device: No Device    Walk 150 feet activity   Assist Walk 150 feet activity did not occur: Safety/medical concerns (endurance)  Assist level: Minimal Assistance - Patient > 75% Assistive device: No Device    Walk 10 feet on uneven surface  activity   Assist Walk 10 feet on uneven surfaces activity did not occur:  (environmental - not accessible)         Wheelchair     Assist Will patient use wheelchair at discharge?: No             Wheelchair 50 feet with 2 turns activity    Assist            Wheelchair 150 feet activity     Assist          Blood pressure 133/78, pulse 84, temperature 98 F (36.7 C), resp. rate 18, height '6\' 1"'$  (1.854 m), weight 94.5 kg, SpO2 99 %.  Medical Problem List and Plan: 1.  Embolic right MCA/PCA infarct             -patient may shower             -ELOS/Goals: 7-10 days S  -Continue CIR 2.  Impaired mobility -DVT/anticoagulation:  Mechanical: Sequential compression devices, below knee Bilateral lower extremities. Dopplers negative at admission--monitor for now.             -antiplatelet therapy: continue ASA 3. Headaches:  tylenol prn. Continue topamax '25mg'$  HS 4. Mood: [name] to follow for evaluation and support.             -antipsychotic agents: N/A 5. Neuropsych: This patient may be intermittenly capable of making decisions on his own behalf. 6. Skin/Wound Care: Routine pressure relief measures. 7. Fluids/Electrolytes/Nutrition: Monitor I/O.  -mild hyponatremia, Kidney fxn ok 8. HTN: Monitor BP TID--on metoprolol and Lisinopril             -likely volume related as BUN elevated   -pushing fluids, IVF at noc   Vitals:   04/13/21 0533 04/13/21 0806  BP: (!) 144/88 133/78  Pulse: 88 84  Resp: 18 18  Temp: 98 F (36.7 C)   SpO2: 100% 99%  Falling behind on fluids again, metoprolol was home med but now on [name] as well , will d/c [name]  9. New onset A fib: Monitor HR tid--continue metoprolol, rate  controlled             --On ASA with recs 10. Headaches/neck pain/Myalgias: Ice for neck pain. Low dose topamax for HA. --amantadine for activation.   11. Pre-diabetes: Hgb A1c- 5.8. Educate on CM diet. 12. Dyslipidemia:  Now on Lipitor for LDL 184. 13. Decreased initiation: started amantadine no clear cut benefit observed , given orthostatic hypotension will d/c. 14. 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   15.  Low grade temp resolved : ua negative, ucx with 5k staph lugdunensis and 10k diptheroids. 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  Sweating without fever: TSH normal, T4 elevated. Will curbside medicine regarding clinical significance of this.   LOS: 9 days A FACE TO FACE EVALUATION WAS PERFORMED  Clide Deutscher Robertson Colclough 04/13/2021, 8:56 AM

## 2021-04-14 DIAGNOSIS — I10 Essential (primary) hypertension: Secondary | ICD-10-CM

## 2021-04-14 DIAGNOSIS — I63431 Cerebral infarction due to embolism of right posterior cerebral artery: Secondary | ICD-10-CM

## 2021-04-14 DIAGNOSIS — I69391 Dysphagia following cerebral infarction: Secondary | ICD-10-CM

## 2021-04-14 DIAGNOSIS — G441 Vascular headache, not elsewhere classified: Secondary | ICD-10-CM

## 2021-04-14 DIAGNOSIS — E871 Hypo-osmolality and hyponatremia: Secondary | ICD-10-CM

## 2021-04-14 DIAGNOSIS — I4891 Unspecified atrial fibrillation: Secondary | ICD-10-CM

## 2021-04-14 LAB — T3, FREE: T3, Free: 2.3 pg/mL (ref 2.0–4.4)

## 2021-04-14 NOTE — Progress Notes (Signed)
PROGRESS NOTE   Subjective/Complaints: Patient seen sitting up in bed this morning working with therapies.  He states he slept fairly overnight.  He denies complaints.  ROS: Denies CP, SOB, N/V/D  Objective:   No results found. No results for input(s): WBC, HGB, HCT, PLT in the last 72 hours.  No results for input(s): NA, K, CL, CO2, GLUCOSE, BUN, CREATININE, CALCIUM in the last 72 hours.   Intake/Output Summary (Last 24 hours) at 04/14/2021 1046 Last data filed at 04/14/2021 0745 Gross per 24 hour  Intake 1182.45 ml  Output --  Net 1182.45 ml         Physical Exam: Vital Signs Blood pressure (!) 95/50, pulse 85, temperature 98.5 F (36.9 C), temperature source Oral, resp. rate 18, height '6\' 1"'$  (1.854 m), weight 93.2 kg, SpO2 98 %. Constitutional: No distress . Vital signs reviewed. HENT: Normocephalic.  Atraumatic. Eyes: EOMI. No discharge. Cardiovascular: No JVD.  RRR. Respiratory: Normal effort.  No stridor.  Bilateral clear to auscultation. GI: Non-distended.  BS +. Skin: Warm and dry.  Intact. Psych: Normal mood.  Normal behavior. Musc: No edema in extremities.  No tenderness in extremities. Neurologic: Alert and oriented Motor: RUE/RLE: 5/5 proximal distal LUE/LE: 4-4+/5 proximal to distal   Assessment/Plan: 1. Functional deficits which require 3+ hours per day of interdisciplinary therapy in a comprehensive inpatient rehab setting. Physiatrist is providing close team supervision and 24 hour management of active medical problems listed below. Physiatrist and rehab team continue to assess barriers to discharge/monitor patient progress toward functional and medical goals  Care Tool:  Bathing    Body parts bathed by patient: Right arm, Left arm, Chest, Abdomen, Front perineal area, Buttocks, Right upper leg, Left upper leg, Face, Right lower leg, Left lower leg     Body parts n/a: Left lower leg, Right  lower leg (did not attempt)   Bathing assist Assist Level: Contact Guard/Touching assist (with use of the grab bars)     Upper Body Dressing/Undressing Upper body dressing   What is the patient wearing?: Pull over shirt    Upper body assist Assist Level: Minimal Assistance - Patient > 75%    Lower Body Dressing/Undressing Lower body dressing      What is the patient wearing?: Underwear/pull up, Pants     Lower body assist Assist for lower body dressing: Minimal Assistance - Patient > 75%     Toileting Toileting Toileting Activity did not occur (Clothing management and hygiene only): N/A (no void or bm)  Toileting assist Assist for toileting: Minimal Assistance - Patient > 75%     Transfers Chair/bed transfer  Transfers assist     Chair/bed transfer assist level: Contact Guard/Touching assist     Locomotion Ambulation   Ambulation assist      Assist level: Minimal Assistance - Patient > 75% Assistive device: No Device Max distance: 10'   Walk 10 feet activity   Assist     Assist level: Minimal Assistance - Patient > 75% Assistive device: No Device   Walk 50 feet activity   Assist    Assist level: Minimal Assistance - Patient > 75% Assistive device: No Device  Walk 150 feet activity   Assist Walk 150 feet activity did not occur: Safety/medical concerns (endurance)  Assist level: Minimal Assistance - Patient > 75% Assistive device: No Device    Walk 10 feet on uneven surface  activity   Assist Walk 10 feet on uneven surfaces activity did not occur:  (environmental - not accessible)         Wheelchair     Assist Will patient use wheelchair at discharge?: No             Wheelchair 50 feet with 2 turns activity    Assist            Wheelchair 150 feet activity     Assist          Blood pressure (!) 95/50, pulse 85, temperature 98.5 F (36.9 C), temperature source Oral, resp. rate 18, height '6\' 1"'$   (1.854 m), weight 93.2 kg, SpO2 98 %.  Medical Problem List and Plan: 1.  Embolic right MCA/PCA infarct   Continues CIR 2.  Impaired mobility -DVT/anticoagulation:  Mechanical: Sequential compression devices, below knee Bilateral lower extremities. Dopplers negative at admission--monitor for now.             -antiplatelet therapy: continue ASA 3. Headaches:  tylenol prn. Continue topamax '25mg'$  HS  Controlled with meds on 8/13 4. Mood: [name] to follow for evaluation and support.             -antipsychotic agents: N/A 5. Neuropsych: This patient may be intermittenly capable of making decisions on his own behalf. 6. Skin/Wound Care: Routine pressure relief measures. 7. Fluids/Electrolytes/Nutrition: Monitor I/O.  -mild hyponatremia, Kidney fxn ok 8. HTN: Monitor BP TID--on metoprolol             -likely volume related as BUN elevated   -pushing fluids, IVF at noc   Vitals:   04/14/21 0432 04/14/21 1008  BP: (!) 101/52 (!) 95/50  Pulse: 65 85  Resp: 18   Temp: 98.5 F (36.9 C)   SpO2: 98%   Falling behind on fluids again, metoprolol was home med but now on [name] as well , d/ced [name] Soft on 8/13, orthostatics negative, monitor for trend 9. New onset A fib: Monitor HR tid--continue metoprolol, rate controlled             --On ASA with recs  Controlled on 8/13 10. Headaches/neck pain/Myalgias: Ice for neck pain. Low dose topamax for HA. --amantadine for activation.   11. Pre-diabetes: Hgb A1c- 5.8. Educate on CM diet. 12. Dyslipidemia:  Now on Lipitor for LDL 184. 13. Decreased initiation: started amantadine no clear cut benefit observed , DC'd. 14. 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   15.  Low grade temp resolved : ua negative, ucx with 5k staph lugdunensis and 10k diptheroids. 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  Sweating without fever: TSH normal, T4 elevated. ?Curbsided medicine regarding  clinical significance of this.  17.  Hyponatremia  Sodium 134 on 8/9, labs ordered for Monday 18.  Post stroke dysphagia  D3 thins, advance diet as tolerated  LOS: 10 days A FACE TO FACE EVALUATION WAS PERFORMED  Micheal Sheen Lorie Phenix 04/14/2021, 10:46 AM

## 2021-04-14 NOTE — Progress Notes (Signed)
Metoprolol held this AM  04/14/21 1008  Vitals  BP (!) 95/50  Pulse Rate 85  Level of Consciousness  Level of Consciousness Alert  MEWS COLOR  MEWS Score Color Green  Pain Assessment  Pain Scale 0-10  Pain Score 0  MEWS Score  MEWS Temp 0  MEWS Systolic 1  MEWS Pulse 0  MEWS RR 0  MEWS LOC 0  MEWS Score 1

## 2021-04-14 NOTE — Progress Notes (Signed)
Speech Language Pathology Daily Session Note  Patient Details  Name: Geoffrey Wells MRN: [id number] Date of Birth: 1946-08-23  Today's Date: 04/14/2021 SLP Individual Time: [id number] SLP Individual Time Calculation (min): 44 min  Short Term Goals: Week 2: SLP Short Term Goal 1 (Week 2): STG=LTG due to ELOS  Skilled Therapeutic Interventions:Skilled ST services focused on cognitive skills. Pt initially requested to sit edge of bed for complete cognitive tasks, but once sitting up right expressed dizziness and fatigue. SLP assisted pt in repositioning in bed with. SLP facilitated basic problem solving, recall, sustained attention and error awareness in novel card task (Blink.) Pt required redirection to task in 1-2 minute intervals due to internal distractions and appeared to have difficulty processing. Pt was able to demonstrate basic problem solving of matching with mod A fade to min A verbal cues and recalled 3 rules to match with use of visual aid with mod A fade to supervision A verbal cues. Pt was only 60% intelligibility with reduce vocal intensity, mumbled speech and little awareness of reduced intelligibility but ability to correct to 80% intelligibility when asked to repeat expression. Pt was left in room with call bell within reach and bed alarm set. SLP notified NT to assess vital signs upon leaving room due to expressed dizziness. SLP recommends to continue skilled services.     Pain Pain Assessment Pain Scale: 0-10 Pain Score: 0-No pain  Therapy/Group: Individual Therapy  Edgard Debord  [facility] 04/14/2021, 10:42 AM

## 2021-04-14 NOTE — Progress Notes (Signed)
Occupational Therapy Session Note  Patient Details  Name: Geoffrey Wells MRN: [id number] Date of Birth: 1945-09-29  Today's Date: 04/14/2021 OT Individual Time: 1402-1505 OT Individual Time Calculation (min): 63 min    Short Term Goals: Week 2:  OT Short Term Goal 1 (Week 2): Continue working on established LTGs set at supervision level overall.  Skilled Therapeutic Interventions/Progress Updates:    Pt in recliner to start session with BP in reclined position and feet elevated at 118/72.  Once he was brought to upright position and his feet were dropped his BP decreased to 85/63.  Re-checked at 118/82 after 2-3 mins.  While sitting, discussed follow-up with pt and spouse with regards to recommendation of visiting a Neurodevelopmental Optometrist in the area for further evaluation and treatment of hemianopsia.  Handout provided as well for reference with phone number.  Had pt complete transfer to the wheelchair with min assist and he was transported down to the ortho gym where he transferred to the therapy mat at the same level.  He reported being cold, so therapist went back and retrieved his jacket, in order to work on donning it.  Min questioning cueing for beginning to dress the LUE first.  He was able to donn and fasten some of the buttons with increased time and min assist.  Next, had him work on simple replication of 4 piece PVC puzzle design therapist presented to him on the left side.  He repeatedly tried to get pieces to replicate the design, but exhibited moderate difficulty, requiring max questioning cueing to gather them all.  Increased frustration noted with pt having moderate difficulty, and realizing that this simple task was now much more difficult for him to complete, unless assisted.  Mod instructional cueing to scan left of midline to see all parts of the diagram example.  Pt given encouragement that it will get easier if he continues to work on it each day.  Finished session with return  to the wheelchair at min assist and transport back to the room.  He was left in the recliner resting with the call button and phone in reach and safety alarm belt in place.  Spouse in room as well.    Therapy Documentation Precautions:  Precautions Precautions: Fall, Other (comment) Precaution Comments: L hemianopsia, SBP goal <140, decreased safety awareness Restrictions Weight Bearing Restrictions: No  Pain: Pain Assessment Pain Scale: Faces Pain Score: 0-No pain ADL: See Care Tool Section for some details and mobility  Therapy/Group: Individual Therapy  Jayke Caul OTR/L 04/14/2021, 4:13 PM

## 2021-04-15 MED ORDER — PHENOL 1.4 % MT LIQD: 1.0000 | OROMUCOSAL | Status: DC | PRN

## 2021-04-15 NOTE — Progress Notes (Signed)
Occupational Therapy Session Note  Patient Details  Name: Geoffrey Wells MRN: [id number] Date of Birth: [birth year]  Today's Date: 04/15/2021 OT Individual Time: 1530-1610 OT Individual Time Calculation (min): 40 min  and Today's Date: 04/15/2021 OT Missed Time: 20 Minutes Missed Time Reason: Patient fatigue   Short Term Goals: Week 1:  OT Short Term Goal 1 (Week 1): Pt will complete LB dressing with CGA. OT Short Term Goal 1 - Progress (Week 1): Not met OT Short Term Goal 2 (Week 1): Pt will tolerate bathing UB and LB sitting/standing at shower level with min assist. OT Short Term Goal 2 - Progress (Week 1): Met OT Short Term Goal 3 (Week 1): Pt will toilet and complete toilet transfer with CGA. OT Short Term Goal 3 - Progress (Week 1): Not met OT Short Term Goal 4 (Week 1): Pt will brush teeth standing sinkside with close supervision. OT Short Term Goal 4 - Progress (Week 1): Not met Week 2:  OT Short Term Goal 1 (Week 2): Continue working on established LTGs set at supervision level overall.  Skilled Therapeutic Interventions/Progress Updates:    Pt greeted at time of session semireclined in bed agreeable to OT session with encouragement, no pain throughout. Supine > sit Supervision and donned jacket with Mod A to sequence and find arm holes. Stand pivot HHA bed > wheelchair. Pt transported to ortho gym for time and energy conservation. Focused on visual scanning and sequencing with BITS for 2 rounds of the following seated: first round #1-15 sequencing in order with Max difficulty and needing Max verbal cues to attend to L side for numbers with 71% accuracy, second round downgraded and dots only for 4 minutes with 70% accuracy. Note needed hand over hand to physically assist with scanning L > R to attend and look for dots/numbers but still with significant difficulty attending to L side. Therapist remaining on L side throughout for additional stimuli. Pt stating at this time "I am so tired I need  to go back to the room." Insistent that he needed to lay down and could not do more therapy despite being offered, wheelchair transport > room and stand pivot HHA to bed. Alarm on call bell in reach. Missed 20 mins of OT.   Therapy Documentation Precautions:  Precautions Precautions: Fall, Other (comment) Precaution Comments: L hemianopsia, SBP goal <140, decreased safety awareness Restrictions Weight Bearing Restrictions: No   Therapy/Group: Individual Therapy  Viona Gilmore 04/15/2021, 12:59 PM

## 2021-04-15 NOTE — Progress Notes (Signed)
Family stated while pt moving from chair to bed, pt started speaking unintelligent and garbling. Assessment completed. Pt c/o dizziness while moving. Pt moved back to bed, symptoms resolved. Pt A+Ox4, able to answer questions appropriately. Vitals obtained, WNL.   04/15/21 1408  Vitals  Temp 98.3 F (36.8 C)  Temp Source Oral  BP 123/73  MAP (mmHg) 86  BP Location Right Arm  BP Method Automatic  Patient Position (if appropriate) Sitting  Pulse Rate 78  Resp 18  MEWS COLOR  MEWS Score Color Green  Oxygen Therapy  SpO2 97 %  O2 Device Room Air  MEWS Score  MEWS Temp 0  MEWS Systolic 0  MEWS Pulse 0  MEWS RR 0  MEWS LOC 0  MEWS Score 0

## 2021-04-15 NOTE — Progress Notes (Signed)
Pt c/o sore throat with swallowing, onset "a couple of days" per pt.. Pt mucous membranes red in color. Pt is afebrile, negative for cough, body aches. MD Posey Pronto informed, new orders received. Sheela Stack, LPN

## 2021-04-15 NOTE — Progress Notes (Signed)
PROGRESS NOTE   Subjective/Complaints: Patient seen laying in bed this morning.  He states he slept well overnight.  No reported issues overnight.  He denies complaints.  ROS: Denies CP, SOB, N/V/D  Objective:   No results found. No results for input(s): WBC, HGB, HCT, PLT in the last 72 hours.  No results for input(s): NA, K, CL, CO2, GLUCOSE, BUN, CREATININE, CALCIUM in the last 72 hours.   Intake/Output Summary (Last 24 hours) at 04/15/2021 0819 Last data filed at 04/15/2021 0700 Gross per 24 hour  Intake 1094.52 ml  Output --  Net 1094.52 ml         Physical Exam: Vital Signs Blood pressure 117/77, pulse (!) 103, temperature 98.5 F (36.9 C), temperature source Oral, resp. rate 16, height '6\' 1"'$  (1.854 m), weight 94.6 kg, SpO2 99 %. Constitutional: No distress . Vital signs reviewed. HENT: Normocephalic.  Atraumatic. Eyes: EOMI. No discharge. Cardiovascular: No JVD.  RRR. Respiratory: Normal effort.  No stridor.  Bilateral clear to auscultation. GI: Non-distended.  BS +. Skin: Warm and dry.  Intact. Psych: Normal mood.  Normal behavior. Musc: No edema in extremities.  No tenderness in extremities. Neurologic: Alert Motor: RUE/RLE: 5/5 proximal distal LUE/LE: 4-4+/5 proximal to distal, unchanged  Assessment/Plan: 1. Functional deficits which require 3+ hours per day of interdisciplinary therapy in a comprehensive inpatient rehab setting. Physiatrist is providing close team supervision and 24 hour management of active medical problems listed below. Physiatrist and rehab team continue to assess barriers to discharge/monitor patient progress toward functional and medical goals  Care Tool:  Bathing    Body parts bathed by patient: Right arm, Left arm, Chest, Abdomen, Front perineal area, Buttocks, Right upper leg, Left upper leg, Face, Right lower leg, Left lower leg     Body parts n/a: Left lower leg, Right  lower leg (did not attempt)   Bathing assist Assist Level: Contact Guard/Touching assist (with use of the grab bars)     Upper Body Dressing/Undressing Upper body dressing   What is the patient wearing?: Pull over shirt    Upper body assist Assist Level: Minimal Assistance - Patient > 75%    Lower Body Dressing/Undressing Lower body dressing      What is the patient wearing?: Underwear/pull up, Pants     Lower body assist Assist for lower body dressing: Minimal Assistance - Patient > 75%     Toileting Toileting Toileting Activity did not occur (Clothing management and hygiene only): N/A (no void or bm)  Toileting assist Assist for toileting: Minimal Assistance - Patient > 75%     Transfers Chair/bed transfer  Transfers assist     Chair/bed transfer assist level: Minimal Assistance - Patient > 75%     Locomotion Ambulation   Ambulation assist      Assist level: Minimal Assistance - Patient > 75% Assistive device: No Device Max distance: 10'   Walk 10 feet activity   Assist     Assist level: Minimal Assistance - Patient > 75% Assistive device: No Device   Walk 50 feet activity   Assist    Assist level: Minimal Assistance - Patient > 75% Assistive device: No Device  Walk 150 feet activity   Assist Walk 150 feet activity did not occur: Safety/medical concerns (endurance)  Assist level: Minimal Assistance - Patient > 75% Assistive device: No Device    Walk 10 feet on uneven surface  activity   Assist Walk 10 feet on uneven surfaces activity did not occur:  (environmental - not accessible)         Wheelchair     Assist Will patient use wheelchair at discharge?: No             Wheelchair 50 feet with 2 turns activity    Assist            Wheelchair 150 feet activity     Assist          Blood pressure 117/77, pulse (!) 103, temperature 98.5 F (36.9 C), temperature source Oral, resp. rate 16, height 6'  1" (1.854 m), weight 94.6 kg, SpO2 99 %.  Medical Problem List and Plan: 1.  Embolic right MCA/PCA infarct   Continue CIR 2.  Impaired mobility -DVT/anticoagulation:  Mechanical: Sequential compression devices, below knee Bilateral lower extremities. Dopplers negative at admission--monitor for now.             -antiplatelet therapy: continue ASA 3. Headaches:  tylenol prn. Continue topamax '25mg'$  HS  Controlled with meds on 8/14 4. Mood: [name] to follow for evaluation and support.             -antipsychotic agents: N/A 5. Neuropsych: This patient may be intermittenly capable of making decisions on his own behalf. 6. Skin/Wound Care: Routine pressure relief measures. 7. Fluids/Electrolytes/Nutrition: Monitor I/O.  -mild hyponatremia, Kidney fxn ok 8. HTN: Monitor BP TID--on metoprolol             -likely volume related as BUN elevated   -pushing fluids, IVF at noc   Vitals:   04/14/21 1925 04/15/21 0449  BP: 138/78 117/77  Pulse: 95 (!) 103  Resp: 18 16  Temp: 99.8 F (37.7 C) 98.5 F (36.9 C)  SpO2: 96% 99%  Falling behind on fluids again, metoprolol was home med but now on [name] as well , d/ced [name] Relatively controlled on 8/14 9. New onset A fib: Monitor HR tid--continue metoprolol, rate controlled             --On ASA with recs  Controlled on 8/14 10. Headaches/neck pain/Myalgias: Ice for neck pain. Low dose topamax for HA. --amantadine for activation.   11. Pre-diabetes: Hgb A1c- 5.8. Educate on CM diet. 12. Dyslipidemia:  Now on Lipitor for LDL 184. 13. Decreased initiation: started amantadine no clear cut benefit observed , DC'd. 14. 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   15.  Low grade temp resolved : ua negative, ucx with 5k staph lugdunensis and 10k diptheroids. 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  Sweating without fever: TSH normal, T4 elevated. ?Curbsided medicine regarding clinical  significance of this.  17.  Hyponatremia  Sodium 134 on 8/9, labs ordered for tomorrow 18.  Post stroke dysphagia  D3 thins, advance diet as tolerated  LOS: 11 days A FACE TO FACE EVALUATION WAS PERFORMED  Borden Thune Lorie Phenix 04/15/2021, 8:19 AM

## 2021-04-16 LAB — BASIC METABOLIC PANEL
Anion gap: 8 (ref 5–15)
BUN: 12 mg/dL (ref 8–23)
CO2: 25 mmol/L (ref 22–32)
Calcium: 8.7 mg/dL — ABNORMAL LOW (ref 8.9–10.3)
Chloride: 100 mmol/L (ref 98–111)
Creatinine, Ser: 1.04 mg/dL (ref 0.61–1.24)
GFR, Estimated: >60 mL/min (ref >60)
Glucose, Bld: 111 mg/dL — ABNORMAL HIGH (ref 70–99)
Potassium: 3.3 mmol/L — ABNORMAL LOW (ref 3.5–5.1)
Sodium: 133 mmol/L — ABNORMAL LOW (ref 135–145)

## 2021-04-16 NOTE — Progress Notes (Signed)
Patient ID: Geoffrey Wells, male   DOB: 08/11/1946, 75 y.o.   MRN: [id number]  SW covering for primary [place].   SW left message for Cathy/[facility] SW [phone] ext [phone]) to discuss DME needs and best process.  *SW received message reporting to email or fax DME referral to [phone] or email: [email or web address].  SW emailed DME order.   [name], MSW, [place] Office: [phone] Cell: [phone] Fax: [phone]

## 2021-04-16 NOTE — Progress Notes (Signed)
Occupational Therapy Session Note  Patient Details  Name: Mykah Shin MRN: [id number] Date of Birth: [birth year]  Today's Date: 04/16/2021 OT Individual Time: 0800-0900   &   1100-1200 OT Individual Time Calculation (min): 60 min   &    60 min   Short Term Goals: Week 1:  OT Short Term Goal 1 (Week 1): Pt will complete LB dressing with CGA. OT Short Term Goal 1 - Progress (Week 1): Not met OT Short Term Goal 2 (Week 1): Pt will tolerate bathing UB and LB sitting/standing at shower level with min assist. OT Short Term Goal 2 - Progress (Week 1): Met OT Short Term Goal 3 (Week 1): Pt will toilet and complete toilet transfer with CGA. OT Short Term Goal 3 - Progress (Week 1): Not met OT Short Term Goal 4 (Week 1): Pt will brush teeth standing sinkside with close supervision. OT Short Term Goal 4 - Progress (Week 1): Not met Week 2:  OT Short Term Goal 1 (Week 2): Continue working on established LTGs set at supervision level overall.  Skilled Therapeutic Interventions/Progress Updates:    Session 1:   Patient in bed, alert and pleasant.  Donned thigh high teds with max A.  Supine to sitting edge of bed with CS.  Unsupported sitting with CS.  He donned hearing aids and utilized app on phone to adjust with set up.  Donned slip on shoes with set up.  Sit to stand and ambulation with RW to/from bed and toilet with CGA.  Toileting completed with CS.  Hand hygiene with set up.  He ate breakfast in unsupported sitting with set up, min cues.  Ambulation with RW x 2 20 feet with CGA.  He is able to propel w/c using feet to/from therapy gym with CS, min cues.  Completed seated and standing conditioning activities with CGA - no LOB, fatigues in stance at 1 minute.  Returned to bed at close of session with CS.  Bed alarm set and call bell in hand.     Session 2:   Patient in bed, alert and ready for second session.  He is able to recall conversation from earlier session.  He requests to work on vision and  general conditioning.  Supine to sitting edge of bed with CS, donns shoes with set up.  Able to walk short distance bed to w/c without AD CS/CGA.  Able to propel w/c > 250 feet to and from therapy gym with bilateral LE's, rest breaks needed.  Completed seated left hand dexterity and visual scanning activity - he was frustrated at times with dexterity impairment.  Note that movement was more fluid and coordinated when engaged in cognitive component of task.  Completed distant visual scanning/reading task without difficulty.  Attempted marsden ball visual pursuit task which he was unable to complete right to left, ant/post with ability to fixate for short duration.  Attempted pursuits with his own finger moving side to side with improved visual attention but for short duration.  He propelled w/c back to room.  Returned to bed with CS/CGA.  Bed placed in chair position and alarm set.  Call bell and tray table in reach.       Therapy Documentation Precautions:  Precautions Precautions: Fall, Other (comment) Precaution Comments: L hemianopsia, SBP goal <140, decreased safety awareness Restrictions Weight Bearing Restrictions: No   Therapy/Group: Individual Therapy  Carlos Levering 04/16/2021, 7:27 AM

## 2021-04-16 NOTE — Progress Notes (Signed)
Speech Language Pathology Daily Session Note  Patient Details  Name: Geoffrey Wells MRN: [id number] Date of Birth: June 25, 1946  Today's Date: 04/16/2021 SLP Individual Time: 1300-1400 SLP Individual Time Calculation (min): 60 min  Short Term Goals: Week 2: SLP Short Term Goal 1 (Week 2): STG=LTG due to ELOS  Skilled Therapeutic Interventions: Patient agreeable to skilled ST intervention with focus on cognitive goals. Patient's spouse was present at bedside and actively participated in today's session. Patient was oriented to all concepts with the exception of day of week/date which is consistent with previous encounters. He was unable to effectively see the calendar that was provided to him across from his bed therefore prompted patient to utilize his phone as compensation. He required max A to locate date & calendar in phone thus suggested to utilize speech command [employer]) function in which he utilized with sup A. With assistance from spouse for clarifying information, SLP facilitated recall and organization of family members via family tree. Patient successfully organized with sup-to-min A verbal cues and additional processing time, and min A semantic cues for recall of names, characteristics, and recent events that took place. Patient was emotional as he spoke of his family and received support and encouragement from his spouse which appeared effective in uplifting his spirits. Patient was encouraged to look through photos in his phone to further assist with organization of his large family, and to offer a form of reminiscence. Spouse was eager to assist in this process therefore SLP offered suggestions to challenge patient such as offering semantic cues or choices vs. giving patient the answer. Patient was left in bed with alarm activated and immediate needs within reach at end of session. Continue per current plan of care.      Pain - patient denied pain   Therapy/Group: Individual Therapy  Patty Sermons 04/16/2021, 5:33 PM

## 2021-04-16 NOTE — Progress Notes (Signed)
PROGRESS NOTE   Subjective/Complaints: Episode of symptomatic orthostasis  ROS: Denies CP, SOB, N/V/D  Objective:   No results found. No results for input(s): WBC, HGB, HCT, PLT in the last 72 hours.  No results for input(s): NA, K, CL, CO2, GLUCOSE, BUN, CREATININE, CALCIUM in the last 72 hours.   Intake/Output Summary (Last 24 hours) at 04/16/2021 0759 Last data filed at 04/16/2021 0700 Gross per 24 hour  Intake 1193.92 ml  Output --  Net 1193.92 ml         Physical Exam: Vital Signs Blood pressure 105/60, pulse 73, temperature 97.7 F (36.5 C), temperature source Oral, resp. rate 18, height '6\' 1"'$  (1.854 m), weight 94.5 kg, SpO2 98 %.  General: No acute distress Mood and affect are appropriate Heart: Regular rate and rhythm no rubs murmurs or extra sounds Lungs: Clear to auscultation, breathing unlabored, no rales or wheezes Abdomen: Positive bowel sounds, soft nontender to palpation, nondistended Extremities: No clubbing, cyanosis, or edema Skin: No evidence of breakdown, no evidence of rash   Musc: No edema in extremities.  No tenderness in extremities. Neurologic: Alert Motor: RUE/RLE: 5/5 proximal distal LUE/LE: 4-4+/5 proximal to distal, unchanged No tactile inattention  Assessment/Plan: 1. Functional deficits which require 3+ hours per day of interdisciplinary therapy in a comprehensive inpatient rehab setting. Physiatrist is providing close team supervision and 24 hour management of active medical problems listed below. Physiatrist and rehab team continue to assess barriers to discharge/monitor patient progress toward functional and medical goals  Care Tool:  Bathing    Body parts bathed by patient: Right arm, Left arm, Chest, Abdomen, Front perineal area, Buttocks, Right upper leg, Left upper leg, Face, Right lower leg, Left lower leg     Body parts n/a: Left lower leg, Right lower leg (did not  attempt)   Bathing assist Assist Level: Contact Guard/Touching assist (with use of the grab bars)     Upper Body Dressing/Undressing Upper body dressing   What is the patient wearing?: Pull over shirt    Upper body assist Assist Level: Minimal Assistance - Patient > 75%    Lower Body Dressing/Undressing Lower body dressing      What is the patient wearing?: Underwear/pull up, Pants     Lower body assist Assist for lower body dressing: Minimal Assistance - Patient > 75%     Toileting Toileting Toileting Activity did not occur (Clothing management and hygiene only): N/A (no void or bm)  Toileting assist Assist for toileting: Minimal Assistance - Patient > 75%     Transfers Chair/bed transfer  Transfers assist     Chair/bed transfer assist level: Minimal Assistance - Patient > 75%     Locomotion Ambulation   Ambulation assist      Assist level: Minimal Assistance - Patient > 75% Assistive device: No Device Max distance: 10'   Walk 10 feet activity   Assist     Assist level: Minimal Assistance - Patient > 75% Assistive device: No Device   Walk 50 feet activity   Assist    Assist level: Minimal Assistance - Patient > 75% Assistive device: No Device    Walk 150 feet activity  Assist Walk 150 feet activity did not occur: Safety/medical concerns (endurance)  Assist level: Minimal Assistance - Patient > 75% Assistive device: No Device    Walk 10 feet on uneven surface  activity   Assist Walk 10 feet on uneven surfaces activity did not occur:  (environmental - not accessible)         Wheelchair     Assist Will patient use wheelchair at discharge?: No             Wheelchair 50 feet with 2 turns activity    Assist            Wheelchair 150 feet activity     Assist          Blood pressure 105/60, pulse 73, temperature 97.7 F (36.5 C), temperature source Oral, resp. rate 18, height '6\' 1"'$  (1.854 m), weight  94.5 kg, SpO2 98 %.  Medical Problem List and Plan: 1.  Embolic right MCA/PCA infarct   Continue CIR 2.  Impaired mobility -DVT/anticoagulation:  Mechanical: Sequential compression devices, below knee Bilateral lower extremities. Dopplers negative at admission--monitor for now.             -antiplatelet therapy: continue ASA 3. Headaches:  tylenol prn. Continue topamax '25mg'$  HS  Controlled with meds on 8/14 4. Mood: [name] to follow for evaluation and support.             -antipsychotic agents: N/A 5. Neuropsych: This patient may be intermittenly capable of making decisions on his own behalf. 6. Skin/Wound Care: Routine pressure relief measures. 7. Fluids/Electrolytes/Nutrition: Monitor I/O.  -mild hyponatremia, may need to switch IVF to .9 NS 8. HTN: Monitor BP TID--on metoprolol             -likely volume related as BUN elevated   -pushing fluids, IVF at noc   Vitals:   04/15/21 1926 04/16/21 0434  BP: 115/68 105/60  Pulse: 84 73  Resp: 18 18  Temp: 98.6 F (37 C) 97.7 F (36.5 C)  SpO2: 95% 98%  Off zestril with low BP when standing, cont TEDs, IVF, d/c metoprolol  9. New onset A fib: Monitor HR tid--d/c BB monitor HR              --On ASA with recs   10. Headaches/neck pain/Myalgias: Ice for neck pain. Low dose topamax for HA. --amantadine for activation.   11. Pre-diabetes: Hgb A1c- 5.8. Educate on CM diet. 12. Dyslipidemia:  Now on Lipitor for LDL 184. 13. Decreased initiation: started amantadine no clear cut benefit observed , DC'd. 14. 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   15.  Low grade temp resolved : ua negative, ucx with 5k staph lugdunensis and 10k diptheroids. 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  Sweating without fever: TSH normal, T4 elevated. ?Curbsided medicine regarding clinical significance of this.  17.  Hyponatremia  Sodium 134 on 8/9, labs ordered for today  18.  Post stroke  dysphagia  D3 thins, advance diet as tolerated  LOS: 12 days A FACE TO [place] E Vaishali Baise 04/16/2021, 7:59 AM

## 2021-04-17 MED ORDER — POTASSIUM CHLORIDE CRYS ER 10 MEQ PO TBCR
10.0000 meq | EXTENDED_RELEASE_TABLET | Freq: Every day | ORAL | Status: DC
  Administered 2021-04-17 – 2021-04-20 (×4): 10 meq via ORAL
  Filled 2021-04-17 (×4): qty 1

## 2021-04-17 NOTE — Progress Notes (Signed)
Occupational Therapy Session Note  Patient Details  Name: Gustavus Basaldua MRN: [id number] Date of Birth: 1946-06-24  Today's Date: 04/17/2021 OT Individual Time: 0904-1001 OT Individual Time Calculation (min): 57 min    Short Term Goals: Week 2:  OT Short Term Goal 1 (Week 2): Continue working on established LTGs set at supervision level overall.  Skilled Therapeutic Interventions/Progress Updates:    Session 1: [id number]) Pt in recliner just finishing with PT treatment and agreeable to ADL tasks.  He was able to remove clothing for shower with min guard sit to stand except for TEDs, which required max assist.  He ambulated to the shower with min guard and completed all bathing at min guard as well.  Noted decreased organization with bathing, washing some parts over and over, while not washing others.  Min instructional cueing to help with organization and sequencing.  He dried off sit to stand with mod instructional cueing to remove towel from under his feet before standing as he likes to shuffle it along under his feet if he stands on it.  Dressing was completed at the sink with increased time and supervision for pullover shirt.  Min guard with mod instructional cueing for donning underpants and feet secondary to visual deficits.  Finished session with pt sitting in the recliner with the call button and phone in reach and safety alarm belt in place.    Session 2: [phone])  Pt completed functional mobility down to the therapy gym with min guard assist and no device to start session.  No report of light headedness was reported.  Had him work on visual scanning to the left as well as cognitive processing and LUE coordination in sitting while working on Actor task.  He needed increased time and mod instructional cueing to complete 50% of the puzzle in 12 min time limit.  Increased difficulty noted when having to scan to the left to see the diagram and then replicate that information onto the grid.   Finished session with return to the room with discussion on progress with pt's spouse.  She states that he needs to be able to sign documents for surveying and will need to continue to work on his signature, however this therapist iunsure how ethical this may be if he is signing off on something with his current visual and cognitive impairments.  Will discuss with other therapy team.     Therapy Documentation Precautions:  Precautions Precautions: Fall, Other (comment) Precaution Comments: L hemianopsia, SBP goal <140, decreased safety awareness Restrictions Weight Bearing Restrictions: No   Pain: Pain Assessment Pain Scale: Faces Pain Score: 0-No pain PAINAD (Pain Assessment in Advanced Dementia) Breathing: normal Negative Vocalization: none Facial Expression: smiling or inexpressive Body Language: relaxed Consolability: no need to console PAINAD Score: 0 ADL: See  Care Tool Section for some details of mobility and selfcare   Therapy/Group: Individual Therapy  Ancel Easler OTR/L 04/17/2021, 10:02 AM

## 2021-04-17 NOTE — Progress Notes (Signed)
Speech Language Pathology Daily Session Note  Patient Details  Name: Marqui Dehaven MRN: [id number] Date of Birth: 08-09-1946  Today's Date: 04/17/2021 SLP Individual Time: 1100-1200 SLP Individual Time Calculation (min): 60 min  Short Term Goals: Week 2: SLP Short Term Goal 1 (Week 2): STG=LTG due to ELOS  Skilled Therapeutic Interventions: Patient agreeable to skilled ST intervention with focus on swallowing and cognitive goals. Facilitated PO trials with regular textures. Patient consumed with mildly prolonged mastication and slight oral residue post swallow in which he effectively cleared with independent liquid rinse. No overt s/sx of aspiration were noted. This strategy was found to be effective under fluoro during recent MBSS to clear vallecular residue. Patient exhibited effective self monitoring skills, appropriate rate of consumption, and implementation of safe swallow precautions during PO trials. With these factors considered, patient appears appropriate for diet advancement to regular textures at this time with intermittent set-up assist and intermittent supervision. Facilitated visual scanning and simple addition with counting change. Patient completed with mod A verbal cues to scan left which impacted accuracy while attempting to count change. Patient exhibited no difficulty with simple addition when he was able to visually locate all coins. Facilitated comprehension of visual 7-day forecast. Again, requiring mod A verbal cues fading to min A for left visual scanning. Patient observed using Siri voice command function to identify the weather with mod I. He required sup A verbal cues to locate missed calls with his phone attributed to "missed calls" being in left visual field. Patient was left in chair with alarm activated and immediate needs within reach at end of session. Continue per current plan of care.      Pain Pain Assessment Pain Scale: 0-10 Pain Score: 0-No pain Multiple Pain  Sites: No PAINAD (Pain Assessment in Advanced Dementia) Breathing: normal Negative Vocalization: none Facial Expression: smiling or inexpressive Body Language: relaxed Consolability: no need to console PAINAD Score: 0  Therapy/Group: Individual Therapy  Patty Sermons 04/17/2021, 11:37 AM

## 2021-04-17 NOTE — Progress Notes (Signed)
Physical Therapy Session Note  Patient Details  Name: Geoffrey Wells MRN: [id number] Date of Birth: 1945-12-14  Today's Date: 04/17/2021 PT Individual Time: 0810-0903 PT Individual Time Calculation (min): 53 min   Short Term Goals: Week 2:  PT Short Term Goal 1 (Week 2): = to LTGs based on ELOS  Skilled Therapeutic Interventions/Progress Updates:    Pt received supine in bed and agreeable to therapy session. Supine>sitting R EOB, HOB elevated but not using bedrail, supervision. Sitting EOB, donned B LE thigh high TED hose and shoes total assist for time management. Sit>stand EOB>RW with CGA and pt tolerated standing ~10seconds and then requested to return to sitting EOB due to fatigue - pt denies lightheadedness. After ~57mnute seated rest able to return to stand and performed gait training ~1326fto ADL apartment using RW with CGA for safety - demos reciprocal stepping pattern though slow gait speed with increased thoracic rounding with downward gaze.  In ADL apartment focused on dynamic in-house gait training using RW to navigate in kitchen and locate items. Pt able to verbalize names of 8 different fruit, but <46m28mte later pt unable to recall that there were 8 total but once aware of the count, able to recall the names of 6/8 without cuing. Gait training in kitchen using RW with CGA/close supervision for safety focusing on L attention and visual scanning - pt requires cuing 90% of the time to visually scan to L side of cabinet/drawer to locate items. Pt requires 3x seated rest break during this task due to fatigue and will often bend over and rest forearms on the counter for standing rest break. Assessed vitals to ensure not hypotensive: BP 134/86 (MAP 98), HR 89bpm . Gait training ~130f73fck to room using RW with CGA/close supervision as described above. Pt remained seated in recliner with needs in reach and seat belt alarm on.  Therapy Documentation Precautions:  Precautions Precautions: Fall,  Other (comment) Precaution Comments: L hemianopsia, SBP goal <140, decreased safety awareness Restrictions Weight Bearing Restrictions: No   Pain:  No reports of pain throughout session.   Therapy/Group: Individual Therapy  CarlTawana ScaleT, DPT, NCS, CSRS 04/17/2021, 7:42 AM

## 2021-04-17 NOTE — Progress Notes (Signed)
Patient ID: Zhamir Tomczyk, male   DOB: Aug 05, 1946, 75 y.o.   MRN: [id number]  SW covering for primary [place].   SW went by pt room to discuss d/c recommendations, however, nursing reported pt was on grounds pass.  SW made several more attempts by patietn room, and pt still gone.   SW called pt wife [name] [phone]) to discuss d/c recommendations. Wife aware on DME: [facility]SC and [facility]. Wife confirms RW and SPC. SW was inquiring about outpatient therapy preference. Wife reported will call SW back to discuss further.   [name], MSW, [place] Office: [phone] Cell: [phone] Fax: [phone]

## 2021-04-17 NOTE — Progress Notes (Signed)
PROGRESS NOTE   Subjective/Complaints: Reviewed labs, now off BB  Discussed labwork Pt states he took K+ supp at home   ROS: Denies CP, SOB, N/V/D  Objective:   No results found. No results for input(s): WBC, HGB, HCT, PLT in the last 72 hours.  Recent Labs    04/16/21 0731  NA 133*  K 3.3*  CL 100  CO2 25  GLUCOSE 111*  BUN 12  CREATININE 1.04  CALCIUM 8.7*     Intake/Output Summary (Last 24 hours) at 04/17/2021 0714 Last data filed at 04/16/2021 1700 Gross per 24 hour  Intake 336 ml  Output --  Net 336 ml         Physical Exam: Vital Signs Blood pressure 139/78, pulse 72, temperature 97.7 F (36.5 C), temperature source Oral, resp. rate 18, height '6\' 1"'$  (1.854 m), weight 94.5 kg, SpO2 97 %.  General: No acute distress Mood and affect are appropriate Heart: Regular rate and rhythm no rubs murmurs or extra sounds Lungs: Clear to auscultation, breathing unlabored, no rales or wheezes Abdomen: Positive bowel sounds, soft nontender to palpation, nondistended Extremities: No clubbing, cyanosis, or edema Skin: No evidence of breakdown, no evidence of rash  Musc: No edema in extremities.  No tenderness in extremities. Neurologic: Alert Motor: RUE/RLE: 5/5 proximal distal LUE/LE: 4-4+/5 proximal to distal, unchanged No tactile inattention  Assessment/Plan: 1. Functional deficits which require 3+ hours per day of interdisciplinary therapy in a comprehensive inpatient rehab setting. Physiatrist is providing close team supervision and 24 hour management of active medical problems listed below. Physiatrist and rehab team continue to assess barriers to discharge/monitor patient progress toward functional and medical goals  Care Tool:  Bathing    Body parts bathed by patient: Right arm, Left arm, Chest, Abdomen, Front perineal area, Buttocks, Right upper leg, Left upper leg, Face, Right lower leg, Left lower  leg     Body parts n/a: Left lower leg, Right lower leg (did not attempt)   Bathing assist Assist Level: Contact Guard/Touching assist (with use of the grab bars)     Upper Body Dressing/Undressing Upper body dressing   What is the patient wearing?: Pull over shirt    Upper body assist Assist Level: Minimal Assistance - Patient > 75%    Lower Body Dressing/Undressing Lower body dressing      What is the patient wearing?: Underwear/pull up, Pants     Lower body assist Assist for lower body dressing: Minimal Assistance - Patient > 75%     Toileting Toileting Toileting Activity did not occur (Clothing management and hygiene only): N/A (no void or bm)  Toileting assist Assist for toileting: Contact Guard/Touching assist     Transfers Chair/bed transfer  Transfers assist     Chair/bed transfer assist level: Minimal Assistance - Patient > 75%     Locomotion Ambulation   Ambulation assist      Assist level: Minimal Assistance - Patient > 75% Assistive device: No Device Max distance: 10'   Walk 10 feet activity   Assist     Assist level: Minimal Assistance - Patient > 75% Assistive device: No Device   Walk 50 feet activity  Assist    Assist level: Minimal Assistance - Patient > 75% Assistive device: No Device    Walk 150 feet activity   Assist Walk 150 feet activity did not occur: Safety/medical concerns (endurance)  Assist level: Minimal Assistance - Patient > 75% Assistive device: No Device    Walk 10 feet on uneven surface  activity   Assist Walk 10 feet on uneven surfaces activity did not occur:  (environmental - not accessible)         Wheelchair     Assist Will patient use wheelchair at discharge?: No             Wheelchair 50 feet with 2 turns activity    Assist            Wheelchair 150 feet activity     Assist          Blood pressure 139/78, pulse 72, temperature 97.7 F (36.5 C), temperature  source Oral, resp. rate 18, height '6\' 1"'$  (1.854 m), weight 94.5 kg, SpO2 97 %.  Medical Problem List and Plan: 1.  Embolic right MCA/PCA infarct   Continue CIR PT, OT,SLP  team conf in am  2.  Impaired mobility -DVT/anticoagulation:  Mechanical: Sequential compression devices, below knee Bilateral lower extremities. Dopplers negative at admission--monitor for now.             -antiplatelet therapy: continue ASA 3. Headaches:  tylenol prn. Continue topamax '25mg'$  HS  Controlled with meds on 8/14 4. Mood: [name] to follow for evaluation and support.             -antipsychotic agents: N/A 5. Neuropsych: This patient may be intermittenly capable of making decisions on his own behalf. 6. Skin/Wound Care: Routine pressure relief measures. 7. Fluids/Electrolytes/Nutrition: Monitor I/O.  Mild hypoK add KCL, recheck later in week  8. HTN: Monitor BP TID--on metoprolol             -likely volume related as BUN elevated   -pushing fluids, IVF at noc   Vitals:   04/16/21 1942 04/17/21 0430  BP: 139/78   Pulse: 72   Resp: 16 18  Temp: 97.9 F (36.6 C) 97.7 F (36.5 C)  SpO2: 97%   Off zestril with low BP when standing, cont TEDs, IVF, d/c metoprolol  9. New onset A fib: Monitor HR tid--d/c BB monitor HR              --On ASA with recs   10. Headaches/neck pain/Myalgias: Ice for neck pain. Low dose topamax for HA. --amantadine for activation.   11. Pre-diabetes: Hgb A1c- 5.8. Educate on CM diet. 12. Dyslipidemia:  Now on Lipitor for LDL 184. 13. Decreased initiation: started amantadine no clear cut benefit observed , DC'd. 14. 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   15.  Low grade temp resolved : ua negative, ucx with 5k staph lugdunensis and 10k diptheroids. 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  Sweating without fever: TSH normal, T4 with minimal elevation, T3 normal  17.  Hyponatremia  Sodium 134 on 8/9,repeat 133  like due to IVF 18.  Post stroke dysphagia  D3 thins, advance diet as tolerated  LOS: 13 days A FACE TO FACE EVALUATION WAS PERFORMED  Charlett Blake 04/17/2021, 7:14 AM

## 2021-04-18 NOTE — Progress Notes (Signed)
Speech Language Pathology Discharge Summary  Patient Details  Name: Geoffrey Wells MRN: [id number] Date of Birth: [birth year]  Today's Date: 04/19/2021 SLP Individual Time: 3382-5053 SLP Individual Time Calculation (min): 45 min  Skilled Therapeutic Interventions: Patient agreeable to skilled ST intervention with focus on swallow and cognitive goals. Patient was consuming morning meal on arrival consisting of regular textures and thin liquids. Patient tolerated with effective mastication and oral clearance without overt s/sx of aspiration. Patient to discharge home on regular diet, thin liquids. Patient is tolerating small whole pills in water, and prefers to take large pills in applesauce. SLP facilitated simple math calculations with min A fading to sup A verbal cues for processing and working memory. Facilitated visual scanning using store receipts with min A verbal/visual cues for scanning left of midline. Patient was left in bed with alarm activated and immediate needs within reach at end of session.    Patient has met 7 of 7 long term goals.  Patient to discharge at overall Supervision;Min level.   Reasons goals not met: NA   Clinical Impression/Discharge Summary: Patient has made consistent gains and has met 7 of 7 long-term goals this admission due to improved oropharyngeal swallow function and diet tolerance, speech intelligibility, and cognitive-linguistic skills. Patient is currently an overall sup-to-min A for basic cognitive tasks and mod A for higher level cognitive tasks. He continues to exhibit decreased short-term recall, complex problem solving, alternating attention, L visual scanning, and insight into deficits. Patient is currently tolerating regular diet and thin liquids and mod I for implementation of safe swallow precautions and strategies including implementing liquid rinse after solids, small bites/sips, and upright positioning. Patient and family education is complete and patient  will discharge home with 24 supervision from family. Patient would benefit from continued SLP services in the outpatient setting to maximize cognitive-linguistic function and functional independence.    Care Partner:  Caregiver Able to Provide Assistance: Yes  Type of Caregiver Assistance: Physical;Cognitive  Recommendation:  Outpatient SLP  Rationale for SLP Follow Up: Maximize cognitive function and independence   Equipment: NA   Reasons for discharge: Treatment goals met   Patient/Family Agrees with Progress Made and Goals Achieved: Yes   Patty Sermons, M.S., CCC-SLP   Alyssabeth Bruster T Abdel Effinger 04/19/2021, 2:16 PM

## 2021-04-18 NOTE — Progress Notes (Signed)
PROGRESS NOTE   Subjective/Complaints:  Per SLP still having memory and attention and awareness problem, swallowing improved.  No BP drops yesterday   ROS: Denies CP, SOB, N/V/D  Objective:   No results found. No results for input(s): WBC, HGB, HCT, PLT in the last 72 hours.  Recent Labs    04/16/21 0731  NA 133*  K 3.3*  CL 100  CO2 25  GLUCOSE 111*  BUN 12  CREATININE 1.04  CALCIUM 8.7*     No intake or output data in the 24 hours ending 04/18/21 0739       Physical Exam: Vital Signs Blood pressure 136/87, pulse 87, temperature (!) 97.5 F (36.4 C), temperature source Oral, resp. rate 17, height 6' 1"  (1.854 m), weight 94.5 kg, SpO2 97 %.  General: No acute distress Mood and affect are appropriate Heart: Regular rate and rhythm no rubs murmurs or extra sounds Lungs: Clear to auscultation, breathing unlabored, no rales or wheezes Abdomen: Positive bowel sounds, soft nontender to palpation, nondistended Extremities: No clubbing, cyanosis, or edema Skin: No evidence of breakdown, no evidence of rash  Musc: No edema in extremities.  No tenderness in extremities. Neurologic: Alert Motor: RUE/RLE: 5/5 proximal distal LUE/LE: 4-4+/5 proximal to distal, unchanged No tactile inattention  Assessment/Plan: 1. Functional deficits which require 3+ hours per day of interdisciplinary therapy in a comprehensive inpatient rehab setting. Physiatrist is providing close team supervision and 24 hour management of active medical problems listed below. Physiatrist and rehab team continue to assess barriers to discharge/monitor patient progress toward functional and medical goals  Care Tool:  Bathing    Body parts bathed by patient: Right arm, Left arm, Chest, Abdomen, Front perineal area, Buttocks, Right upper leg, Left upper leg, Face     Body parts n/a: Left upper leg, Right lower leg   Bathing assist Assist Level:  Contact Guard/Touching assist     Upper Body Dressing/Undressing Upper body dressing   What is the patient wearing?: Pull over shirt    Upper body assist Assist Level: Set up assist    Lower Body Dressing/Undressing Lower body dressing      What is the patient wearing?: Underwear/pull up, Pants     Lower body assist Assist for lower body dressing: Contact Guard/Touching assist     Toileting Toileting Toileting Activity did not occur (Clothing management and hygiene only): N/A (no void or bm)  Toileting assist Assist for toileting: Contact Guard/Touching assist     Transfers Chair/bed transfer  Transfers assist     Chair/bed transfer assist level: Contact Guard/Touching assist Chair/bed transfer assistive device: Programmer, multimedia   Ambulation assist      Assist level: Contact Guard/Touching assist Assistive device: Walker-rolling Max distance: 194f   Walk 10 feet activity   Assist     Assist level: Contact Guard/Touching assist Assistive device: Walker-rolling   Walk 50 feet activity   Assist    Assist level: Contact Guard/Touching assist Assistive device: Walker-rolling    Walk 150 feet activity   Assist Walk 150 feet activity did not occur: Safety/medical concerns (endurance)  Assist level: Minimal Assistance - Patient > 75% Assistive device: No  Device    Walk 10 feet on uneven surface  activity   Assist Walk 10 feet on uneven surfaces activity did not occur:  (environmental - not accessible)         Wheelchair     Assist Will patient use wheelchair at discharge?: No             Wheelchair 50 feet with 2 turns activity    Assist            Wheelchair 150 feet activity     Assist          Blood pressure 136/87, pulse 87, temperature (!) 97.5 F (36.4 C), temperature source Oral, resp. rate 17, height 6' 1"  (1.854 m), weight 94.5 kg, SpO2 97 %.  Medical Problem List and Plan: 1.   Embolic right MCA/PCA infarct   Continue CIR PT, OT,SLP  Team conference today please see physician documentation under team conference tab, met with team  to discuss problems,progress, and goals. Formulized individual treatment plan based on medical history, underlying problem and comorbidities.  2.  Impaired mobility -DVT/anticoagulation:  Mechanical: Sequential compression devices, below knee Bilateral lower extremities. Dopplers negative at admission--monitor for now.             -antiplatelet therapy: continue ASA 3. Headaches:  tylenol prn. Continue topamax 64m HS  Controlled with meds on 8/14 4. Mood: [name] to follow for evaluation and support.             -antipsychotic agents: N/A 5. Neuropsych: This patient may be intermittenly capable of making decisions on his own behalf. 6. Skin/Wound Care: Routine pressure relief measures. 7. Fluids/Electrolytes/Nutrition: Monitor I/O.  Mild hypoK add KCL, recheck later in week  8. HTN: Monitor BP TID--on metoprolol             -likely volume related as BUN elevated   -pushing fluids, IVF at noc   Vitals:   04/17/21 1930 04/18/21 0446  BP: 129/71 136/87  Pulse: 68 87  Resp: 18 17  Temp: 98.8 F (37.1 C) (!) 97.5 F (36.4 C)  SpO2: 97% 97%  Off zestril with low BP when standing, cont TEDs,d/c metoprolol and IVF  9. New onset A fib: Monitor HR tid--d/c BB monitor HR              --On ASA with recs   10. Headaches/neck pain/Myalgias: Ice for neck pain. Low dose topamax for HA. --amantadine for activation.   11. Pre-diabetes: Hgb A1c- 5.8. Educate on CM diet. 12. Dyslipidemia:  Now on Lipitor for LDL 184. 13. Decreased initiation: started amantadine no clear cut benefit observed , DC'd. 14. 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   15.  Low grade temp resolved : ua negative, ucx with 5k staph lugdunensis and 10k diptheroids. 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   Sweating without fever: TSH normal, T4 with minimal elevation, T3 normal  17.  Hyponatremia  Sodium 134 on 8/9,repeat 133 like due to IVF, D/C IVF repeat in 2d 18.  Post stroke dysphagia  D3 thins, advance diet as tolerated  LOS: 14 days A FACE TO FFair PlayE Maleya Leever 04/18/2021, 7:39 AM

## 2021-04-18 NOTE — Progress Notes (Signed)
Patient ID: Geoffrey Wells, male   DOB: 04/05/1946, 75 y.o.   MRN: [id number]  SW covering for primary [place].   SW spoke with pt wife [name] [phone]) and son [name] [phone]) conference call to review pt d/c. SW informed that DME: [employer] and 3in1 BSC was ordered by [employer] with plans to ship to the home. Wife has not received any updates yet. Also, they are concerned about pt requiring outpatient therapies as the wife works and has limited flexibility with her work schedule. Would like to know if pt is eligible for transportation through [place]. SW will explore option, and will f/u once more information.[facility] provided [facility] Pt wife will come in for family edu tomorrow at pt scheduled times since she is off tomorrow.   *SW received updates from Cathy/VA SW who reported pt doe snot appear to be eligible for [facility] transportation, and will send to PCP so pt can be evaluated for transportation needs.   SW will update pt family on above.   [name], MSW, [place] Office: [phone] Cell: [phone] Fax: [phone]

## 2021-04-18 NOTE — Progress Notes (Signed)
Physical Therapy Session Note  Patient Details  Name: Geoffrey Wells MRN: [id number] Date of Birth: Nov 30, 1945  Today's Date: 04/18/2021 PT Individual Time: 1108-1204 PT Individual Time Calculation (min): 56 min   Short Term Goals: Week 2:  PT Short Term Goal 1 (Week 2): = to LTGs based on ELOS  Skilled Therapeutic Interventions/Progress Updates:    Pt received supine in bed with lights off but awake and agreeable to therapy session though pt reporting he recently was assisted back to bed. Pt already wearing B LE thigh high TED hose. Supine>sitting R EOB, HOB flat and not using bedrail, supervision. Sitting EOB donned jacket and shoes set-up assist. Sit<>stands using RW with supervision for safety during session and without AD with intermittent CGA for safety. Gait training ~155f to main therapy gym using RW with close supervision for safety - demos reciprocal stepping pattern though continued excessive thoracic kyphosis - adequate gait speed. Pt participated in Functional Gait Assessment (FGA) (no AD) with score of 14/30 demonstrating high fall risk (low fall risk 25-28, medium fall risk 19-24, and high fall risk <19). Requires seated rest break between every ~2-3 items due to impaired endurance. Participated in dynamic gait training, no AD, with dual-cognitive and L visual scanning task of locating post-it notes with sequence number, letter (1, A, 2, B, ...) with pt able to find through B prior to requiring seated rest break due to endurance impairments - requires mod cuing to recall the pattern during dual-task situation - varying CGA/supervision for balance safety during. Pt demos ~3 instances of minor posterior LOB and demos adequate ankle strategy to recovery balance with only close supervision for safety. Gait training ~15104fback to room using RW with supervision for safety - pt becoming fatigued towards end. Pt remained in recliner with needs in reach and seat belt alarm on.  Therapy  Documentation Precautions:  Precautions Precautions: Fall, Other (comment) Precaution Comments: L hemianopsia, SBP goal <140, decreased safety awareness Restrictions Weight Bearing Restrictions: No   Pain: No reports of pain throughout session.  Balance: Standardized Balance Assessment Standardized Balance Assessment: Functional Gait Assessment Functional Gait  Assessment Gait Level Surface: Walks 20 ft in less than 7 sec but greater than 5.5 sec, uses assistive device, slower speed, mild gait deviations, or deviates 6-10 in outside of the 12 in walkway width. Change in Gait Speed: Able to change speed, demonstrates mild gait deviations, deviates 6-10 in outside of the 12 in walkway width, or no gait deviations, unable to achieve a major change in velocity, or uses a change in velocity, or uses an assistive device. Gait with Horizontal Head Turns: Performs head turns with moderate changes in gait velocity, slows down, deviates 10-15 in outside 12 in walkway width but recovers, can continue to walk. (pt states when he looks L/R then he has to "guess which way I'm going") Gait with Vertical Head Turns: Performs task with moderate change in gait velocity, slows down, deviates 10-15 in outside 12 in walkway width but recovers, can continue to walk. Gait and Pivot Turn: Pivot turns safely in greater than 3 sec and stops with no loss of balance, or pivot turns safely within 3 sec and stops with mild imbalance, requires small steps to catch balance. Step Over Obstacle: Is able to step over one shoe box (4.5 in total height) without changing gait speed. No evidence of imbalance. Gait with Narrow Base of Support: Ambulates less than 4 steps heel to toe or cannot perform without assistance. Gait with  Eyes Closed: Cannot walk 20 ft without assistance, severe gait deviations or imbalance, deviates greater than 15 in outside 12 in walkway width or will not attempt task. Ambulating Backwards: Walks 20 ft,  uses assistive device, slower speed, mild gait deviations, deviates 6-10 in outside 12 in walkway width. Steps: Alternating feet, must use rail. Total Score: 14    Therapy/Group: Individual Therapy  Tawana Scale , PT, DPT, NCS, CSRS 04/18/2021, 11:22 AM

## 2021-04-18 NOTE — Patient Care Conference (Signed)
Inpatient RehabilitationTeam Conference and Plan of Care Update Date: 04/18/2021   Time: 10:45 AM    Patient Name: Geoffrey Wells      Medical Record Number: [id number]  Date of Birth: 18-Aug-1946 Sex: Male         Room/Bed: 4M03C/4M03C-01 Payor Info: Payor: [employer] / Plan: [place] / Product Type: *No Product type* /    Admit Date/Time:  04/04/2021  3:32 PM  Primary Diagnosis:  Embolic stroke [facility])  Hospital Problems: Principal Problem:   Embolic stroke [facility]) Active Problems:   Dysphagia, post-stroke   Hyponatremia   New onset atrial fibrillation [facility])   Essential hypertension   Vascular headache    Expected Discharge Date: Expected Discharge Date: 04/20/21  Team Members Present: Physician leading conference: Dr. Alysia Penna Social Worker Present: [name], [place] Nurse Present: Dorien Chihuahua, RN PT Present: Page Spiro, PT OT Present: Clyda Greener, OT SLP Present: Sherren Kerns, SLP PPS Coordinator present : Gunnar Fusi, SLP     Current Status/Progress Goal Weekly Team Focus  Bowel/Bladder   Patient is continent of bowel and bladder. LBM: 04/16/21.  Patient will remain continent x2.  Nursing staff will assist patient with toileting q2h and prn.   Swallow/Nutrition/ Hydration   Advanced to regular diet (8/16) and thin liquids - mod I  mod I  regular texture tolerance, continued implementation of swallow strategies   ADL's   Supervision for UB batihing and dressing with cueing for orientation of clothing.  Min guard for LB bathing and dressing as well.  Min guard to min assist for transfers without the RW for support to the bathroom.  Still with decreased cognitive processing and left visual field deficit.  supervision overall  selfcare retraining, transfer training, therapeutic activites, vision compensation, DME education, neuromuscular re-education, energy conservation strategies.   Mobility   supervision bed mobility, CGA  approaching supervision sit<>stand and stand pivot transfers using RW, gait up to 160f using RW with CGA progressing to supervision, CGA 8 steps using BHRs - continues to demo L visual field/inattention impairments - decreased instances of orthostatic hypotension with no complaints of symtpoms  supervision overall at ambulatory level  activity tolerance, pt/family education, standing balance, gait training with LRAD, B LE strengthening, L attention, transfer training, D/C planning, stair navigation   Communication   mod I  Mod I  Increased vocal intensity, reducing speech rate intermittently   Safety/Cognition/ Behavioral Observations  min A basic, mod A complex  min A  problem solving, recall, left visual scanning, insight into deficits   Pain   Patient currently denies pain.  Patient will remain pain free.  Nursing staff will assess pain q shift and prn. Administer prn pain medications when needed.   Skin   Skin is intact.  Patient's skin will remain intact.  Nursing staff will assess patient's skin q shift and prn.     Discharge Planning:  Discharging home with family to provide care (spouse and son) 24/7   Team Discussion: MD adjusting medications and discontinue IV fluids. Patient appears on track for discharge if he can maintain his blood pressure off IVF.  Patient on target to meet rehab goals: yes, currently supervision for upper body self care and min guard for lower body care. Transfers with min guard assist without an assistive device. Patient has trouble threading arms/legs due to left inattention and left visual field deficits. Cognitively he waxes/wanes and has inconsistent orientation. Able to complete sit - stand-pivot transfers with supervision assist  and using a walker for stability. Able to ambulate 63' w RW with CGA - supervision assist. Manages a regular consistency/thin liquid diet with swallowing strategies and intermittent supervision. Note decreased dysrthria. Overall  cognition requires min assist and max assist for complex cognition at present.  *See Care Plan and progress notes for long and short-term goals.   Revisions to Treatment Plan:  Working on gait training, standing endurance and toileting.  Working on short term recall, problem solving Teaching Needs: Energy conservation tips, safety, medications, secondary risk management, etc  Current Barriers to Discharge: Decreased caregiver support and Home enviroment access/layout  Possible Resolutions to Barriers: Family education OP follow up services (PT, OT and SLP)     Medical Summary Current Status: Orthostatic hypotension requiring medication adjustment, still on IV fluids  Barriers to Discharge: Medical stability   Possible Resolutions to [employer] Focus: Discontinue IV fluids, recheck basic metabolic package prior to discharge, off antihypertensive meds   Continued Need for Acute Rehabilitation Level of Care: The patient requires daily medical management by a physician with specialized training in physical medicine and rehabilitation for the following reasons: Direction of a multidisciplinary physical rehabilitation program to maximize functional independence : Yes Medical management of patient stability for increased activity during participation in an intensive rehabilitation regime.: Yes Analysis of laboratory values and/or radiology reports with any subsequent need for medication adjustment and/or medical intervention. : Yes   I attest that I was present, lead the team conference, and concur with the assessment and plan of the team.   Dorien Chihuahua B 04/18/2021, 11:32 AM

## 2021-04-18 NOTE — Progress Notes (Signed)
Speech Language Pathology Daily Session Note  Patient Details  Name: Geoffrey Wells MRN: [id number] Date of Birth: [birth year]  Today's Date: 04/18/2021 SLP Individual Time: 0730-0830 SLP Individual Time Calculation (min): 60 min  Short Term Goals: Week 2: SLP Short Term Goal 1 (Week 2): STG=LTG due to ELOS  Skilled Therapeutic Interventions: Patient agreeable to skilled ST intervention with focus on swallowing and cognitive goals. Patient consumed regular diet and thin liquids with timely mastication and oral clearance. No overt s/sx of aspiration noted. Patient utilized [name] voice command function on iphone to orient to day of week and date. Min-to-mod A for further phone usage secondary to visual deficits, which is why he compensates and utilizes voice command often. SLP facilitated working memory and Facilities manager task with sup A verbal cues for immediate recall. Patient indicated there is paperwork that needs to be completed for his job. He acknowledged that this would not be appropriate to complete at this time due to being in the hospital, however exhibited decreased insight to his cognitive deficits and visual impairments that would impact appropriateness to sign documents. Patient was left in bed with alarm activated and immediate needs within reach at end of session. Continue per current plan of care.      Pain Pain Assessment Pain Scale: 0-10 Pain Score: 0-No pain  Therapy/Group: Individual Therapy  [name] T [name] 04/18/2021, 8:02 AM

## 2021-04-18 NOTE — Progress Notes (Signed)
Occupational Therapy Session Note  Patient Details  Name: Jaqui Oftedahl MRN: [id number] Date of Birth: 1946-01-23  Today's Date: 04/18/2021 OT Individual Time: 0902-1002 OT Individual Time Calculation (min): 60 min    Short Term Goals: Week 2:  OT Short Term Goal 1 (Week 2): Continue working on established LTGs set at supervision level overall.  Skilled Therapeutic Interventions/Progress Updates:    Session 1: [id number])  Pt in bed to start with therapist assisting to donn his thigh high TEDs.  He was able to complete supine to sit with supervision with BP in sitting at 141/105.  He donned his shoes with setup as well and then stood for BP check at 118/78.  He ambulated over to the sink with min guard assist for grooming tasks and he completed oral hygiene and washing his face with setup and no verbal cueing to locate items left of the sink.  He did not report any light headedness with standing for over 3-4 mins.  He was able to then transfer to the wheelchair and completed transport down to the therapy gym.  He then transferred to the therapy mat with completion of LUE FM coordination task with nuts and bolts activity.  Increased difficulty noted when attempting to use 3 jaw chuck to turn nut.  He completed several sizes with functional reach from chest level to eye level.  Finished session with return to the wheelchair and transport back to the room.  Min guard for transfer to the recliner to complete session.  Call button and phone in reach with safety alarm belt in place.  Pt not oriented to day of the week asking therapist "Is this Thursday?".    Session 2: (1335-1410)  Pt in bed to start, trying to transfer to the edge before alarm was off or bed rail was placed down.  He was able to donn his pullover shirt with supervision and then donn his slip on shoes.  He ambulated down to the table in the kitchen with min guard assist and no device.  Next, he worked on visual scanning sheet having to mark out  a given letter on each line every time it occurred.   Pt with significant errors to start missing letters on the left as well as some on the right.  Educated pt on use of an anchor point on the left side (highlighted line) to scan back to with each line so he would know where it begins.  Also educated him on using his finger as a guide to follow on each line so that he doesn't skip any letters.  He was more consistent with finding the letters with these techniques, but still exhibited missing 2-3 letters on each line.  Incorporated LUE use as well for holding the highlighter and marking the letters.  Returned to the room with min guard assist and pt sitting up in the recliner.  Discussed progress with pt's spouse and issued handout to continue working on finding the letters.  Call button and phone in reach with safety alarm belt in place.    Therapy Documentation Precautions:  Precautions Precautions: Fall, Other (comment) Precaution Comments: L hemianopsia, SBP goal <140, decreased safety awareness Restrictions Weight Bearing Restrictions: No  Pain: Pain Assessment Pain Scale: Faces Pain Score: 0-No pain ADL: See Care Tool Section for some details of mobility and selfcare  Therapy/Group: Individual Therapy  Kerrigan Gombos OTR/L 04/18/2021, 10:50 AM

## 2021-04-19 MED ORDER — METHOCARBAMOL 500 MG PO TABS: 500.0000 mg | ORAL_TABLET | Freq: Four times a day (QID) | ORAL | 0 refills | Status: DC | PRN

## 2021-04-19 MED ORDER — PANTOPRAZOLE SODIUM 40 MG PO TBEC: 40.0000 mg | DELAYED_RELEASE_TABLET | Freq: Every day | ORAL | 0 refills | Status: DC

## 2021-04-19 MED ORDER — TRAZODONE HCL 50 MG PO TABS: 25.0000 mg | ORAL_TABLET | Freq: Every evening | ORAL | 0 refills | Status: DC | PRN

## 2021-04-19 MED ORDER — MAGNESIUM 500 MG PO TABS: 1.0000 | ORAL_TABLET | Freq: Every day | ORAL | 0 refills | Status: AC

## 2021-04-19 MED ORDER — TOPIRAMATE 25 MG PO TABS: 25.0000 mg | ORAL_TABLET | Freq: Every day | ORAL | 0 refills | Status: DC

## 2021-04-19 NOTE — Progress Notes (Signed)
Pt refusing topamax 25 mg HS this evening, states he does not have a headache or migraine  and no longer wants to take it. Pt was educated.

## 2021-04-19 NOTE — Progress Notes (Signed)
Patient ID: Geoffrey Wells, male   DOB: 1946/01/10, 75 y.o.   MRN: [id number]  SW covering for primary [name].   SW spoke son Geoffrey Wells [phone]) to inform on [place] reporting no transportation eligibility and pt will be assessed by provider on 8/25. Believes HH is better than outpatient at this time. He is aware SW to follow-up with his mother.  SW spoke with pt wife Geoffrey Wells [phone]) to provide updates but pt wife is here. SW to go by and follow-up.SW went to pt room and wife left but states she will return. SW informed will call her.  *SW spoke with pt wife who reported preferred HHAs are: 1) [facility] and 2) [facility]. SW updated pt [facility] [name] who reported pt will need to use his Medicare benefits for [facility] services as they only use [facility] for patients who are uninsured. Pt has Medicare Part A ID # [id number] .   SW sent HHPT/OT/SLP referral to [facility] and waiting on follow-up.  *referral accepted. SW called pt in room and wife answered phone. SW provided updates.   [name], MSW, [place] Office: [phone] Cell: [phone] Fax: [phone]

## 2021-04-19 NOTE — Plan of Care (Signed)
  Problem: RH Swallowing Goal: LTG Patient will consume least restrictive diet using compensatory strategies with assistance (SLP) Description: LTG:  Patient will consume least restrictive diet using compensatory strategies with assistance (SLP) Outcome: Completed/Met Goal: LTG Patient will participate in dysphagia therapy to increase swallow function with assistance (SLP) Description: LTG:  Patient will participate in dysphagia therapy to increase swallow function with assistance (SLP) Outcome: Completed/Met   Problem: RH Expression Communication Goal: LTG Patient will increase speech intelligibility (SLP) Description: LTG: Patient will increase speech intelligibility at word/phrase/conversation level with cues, % of the time (SLP) Outcome: Completed/Met   Problem: RH Problem Solving Goal: LTG Patient will demonstrate problem solving for (SLP) Description: LTG:  Patient will demonstrate problem solving for basic/complex daily situations with cues  (SLP) Outcome: Completed/Met   Problem: RH Memory Goal: LTG Patient will demonstrate ability for day to day (SLP) Description: LTG:   Patient will demonstrate ability for day to day recall/carryover during cognitive/linguistic activities with assist  (SLP) Outcome: Completed/Met Goal: LTG Patient will use memory compensatory aids to (SLP) Description: LTG:  Patient will use memory compensatory aids to recall biographical/new, daily complex information with cues (SLP) Outcome: Completed/Met   Problem: RH Attention Goal: LTG Patient will demonstrate this level of attention during functional activites (SLP) Description: LTG:  Patient will will demonstrate this level of attention during functional activites (SLP) Outcome: Completed/Met

## 2021-04-19 NOTE — Progress Notes (Signed)
Physical Therapy Session Note  Patient Details  Name: Geoffrey Wells MRN: [id number] Date of Birth: 05-15-1946  Today's Date: 04/19/2021 PT Individual Time: 0850-0936 PT Individual Time Calculation (min): 46 min   Short Term Goals: Week 2:  PT Short Term Goal 1 (Week 2): = to LTGs based on ELOS  Skilled Therapeutic Interventions/Progress Updates:    Pt received supine in bed and agreeable to therapy session. Supine>sitting R EOB, independently. Sitting EOB donned B LE thigh high TED hose total assist for time management. Discussed D/C tomorrow with pt reporting no questions/concerns and states he is ready to be home. Donned shoes and jacket set-up assist. Sit<>stands using RW with supervision during session - requires cuing not to pull up on RW when coming to stand. Gait training ~125f 2x to/from therapy gym using RW with supervision for safety - demos decreased gait speed with decreased B LE step lengths and excessive thoracic kyphosis. Educated pt on fall risk safety and when to call 911. Educated and demonstrated floor transfer technique with pt demonstrating understanding with CGA for safety. At end of session pt remained seated in recliner with needs in reach and seat belt alarm on.  Therapy Documentation Precautions:  Precautions Precautions: Fall, Other (comment) Precaution Comments: L hemianopsia, SBP goal <140, decreased safety awareness Restrictions Weight Bearing Restrictions: No   Pain:  No reports of pain throughout session.   Therapy/Group: Individual Therapy  CTawana Scale, PT, DPT, NCS, CSRS  04/19/2021, 7:56 AM

## 2021-04-19 NOTE — Plan of Care (Signed)

## 2021-04-19 NOTE — Progress Notes (Signed)
Occupational Therapy Discharge Summary  Patient Details  Name: Geoffrey Wells MRN: [id number] Date of Birth: June 11, 1946  Today's Date: 04/19/2021 OT Individual Time: 1102-1202 OT Individual Time Calculation (min): 60 min   Session Note:  Pt worked on bathing and dressing tasks with spouse present for education during session.  He was able to complete transfer to the EOB with independence and then ambulated to the toilet with supervision using the RW.  Min instructional cueing for hand placement with sit to stand as well as for aligning himself to the surface before sitting with the RW directly in front of him.  He was able to complete all toileting tasks with supervision with increased impulsivity and decreased awareness noted as he stood up without therapist beside of him to transfer to the shower.  He was able to complete all bathing sit to stand with supervision and no verbal cueing needed for sequencing and thoroughness that was needed previously.  He ambulated out to the bed with supervision for completion of dressing tasks.  Supervision for all dressing with min instructional cueing to begin dressing the LLE first as he was unable to successfully place the LLE in the pants leg after completing the right.  Therapist had to assist with orientation of clothing secondary to one pants leg being inside out and pt being unable to figure out how to fix it.  Provided education on FM coordination tasks with pt and spouse with handout provided.  Foam grips also issued for continued work on self feeding with the [place].  Pt left in the recliner with the call button and phone in reach and safety belt in place.     Patient has met 9 of 9 long term goals due to improved activity tolerance, improved balance, postural control, functional use of  LEFT upper extremity, improved attention, improved awareness, and improved coordination.  Patient to discharge at overall Supervision level.  Patient's care partner is independent to  provide the necessary physical and cognitive assistance at discharge.    Reasons goals not met: NA  Recommendation:  Patient will benefit from ongoing skilled OT services in home health setting to continue to advance functional skills in the area of BADL, Vocation, and Reduce care partner burden.  Feel pt will benefit from continued OT to progress ADL function to modified independent level as well as continue working on visual deficits as well as compensation for function.  Also recommend continued OT to increase LUE function to a WFLS level.    Equipment: 3:1, tub seat  Reasons for discharge: treatment goals met and discharge from hospital  Patient/family agrees with progress made and goals achieved: Yes  OT Discharge Precautions/Restrictions  Precautions Precautions: Fall;Other (comment) Precaution Comments: L hemianopsia, SBP goal <140, decreased safety awareness Restrictions Weight Bearing Restrictions: No  Pain  No report of pain ADL ADL Eating: Supervision/safety Where Assessed-Eating: Chair Grooming: Supervision/safety Where Assessed-Grooming: Standing at sink Upper Body Bathing: Setup Where Assessed-Upper Body Bathing: Shower Lower Body Bathing: Supervision/safety Where Assessed-Lower Body Bathing: Shower Upper Body Dressing: Setup Where Assessed-Upper Body Dressing: Edge of bed Lower Body Dressing: Supervision/safety Where Assessed-Lower Body Dressing: Edge of bed Toileting: Supervision/safety Where Assessed-Toileting: Bedside Commode Toilet Transfer: Close supervision Toilet Transfer Method: Counselling psychologist: Engineer, technical sales Transfer: Close supervison Clinical cytogeneticist Method: Optometrist: Civil engineer, contracting with back Social research officer, government: Close supervision Social research officer, government Method: Heritage manager: Shower seat without back Vision Baseline Vision/History: Wears glasses Wears  Glasses: At all  times Patient Visual Report: Peripheral vision impairment Vision Assessment?: Yes Eye Alignment: Within Functional Limits Alignment/Gaze Preference: Within Defined Limits Visual Fields: Left visual field deficit Perception  Perception: Impaired Inattention/Neglect: Does not attend to left visual field Praxis Praxis: Impaired Praxis Impairment Details: Motor planning Cognition Overall Cognitive Status: Impaired/Different from baseline Arousal/Alertness: Awake/alert Attention: Selective;Sustained Focused Attention: Appears intact Sustained Attention: Impaired Sustained Attention Impairment: Functional basic Selective Attention: Impaired Selective Attention Impairment: Functional basic Alternating Attention: Impaired Alternating Attention Impairment: Functional basic;Verbal basic Memory: Impaired Memory Impairment: Storage deficit;Retrieval deficit;Decreased recall of new information Awareness: Impaired Awareness Impairment: Emergent impairment;Anticipatory impairment Problem Solving: Impaired Problem Solving Impairment: Functional basic;Functional complex Behaviors: Impulsive Safety/Judgment: Impaired Sensation Sensation Light Touch: Appears Intact Hot/Cold: Appears Intact Proprioception: Appears Intact Stereognosis: Not tested Coordination Gross Motor Movements are Fluid and Coordinated: No Fine Motor Movements are Fluid and Coordinated: No Coordination and Movement Description: Coordination deficits noted in the LUE.  Currently uses at a diminshed level for selfcare tasks with greater impairment noted with FM tasks such as holding and using his utensis during self feeding and for grooming tasks. 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 Mobility  Bed Mobility Bed Mobility: Sit to Supine;Supine to Sit Supine to Sit: Independent Sit to Supine: Independent Transfers Sit to Stand:  Supervision/Verbal cueing Stand to Sit: Supervision/Verbal cueing  Trunk/Postural Assessment  Cervical Assessment Cervical Assessment: Exceptions to [facility] (slight forward head) Thoracic Assessment Thoracic Assessment: Exceptions to [facility] (thoracic kyphosis) Lumbar Assessment Lumbar Assessment: Exceptions to [facility] (posterior pelvic tilt)  Balance Balance Balance Assessed: Yes Static Sitting Balance Static Sitting - Balance Support: Feet supported;No upper extremity supported Static Sitting - Level of Assistance: 7: Independent Dynamic Sitting Balance Dynamic Sitting - Balance Support: During functional activity Dynamic Sitting - Level of Assistance: 5: Stand by assistance Static Standing Balance Static Standing - Balance Support: During functional activity Static Standing - Level of Assistance: 5: Stand by assistance Dynamic Standing Balance Dynamic Standing - Balance Support: During functional activity Dynamic Standing - Level of Assistance: 5: Stand by assistance Extremity/Trunk Assessment RUE Assessment RUE Assessment: Within Functional Limits LUE Assessment LUE Assessment: Exceptions to [facility] Active Range of Motion (AROM) Comments: WFL General Strength Comments: strength 4+/5 throughout.  Decreased FM coordination with inability to complete oppositon of thumb to any digits consistently.  Demonwtrates good gross grasp as well as extension, but motor planning deficits impacting coordination.   Armel Rabbani OTR/L 04/19/2021, 4:22 PM

## 2021-04-19 NOTE — Progress Notes (Signed)
Physical Therapy Discharge Summary  Patient Details  Name: Geoffrey Wells MRN: [id number] Date of Birth: 1945/09/04  Today's Date: 04/19/2021 PT Individual Time: 5852-7782 PT Individual Time Calculation (min): 65 min    Patient has met 9 of 9 long term goals due to improved activity tolerance, improved balance, improved postural control, increased strength, functional use of  left upper extremity and left lower extremity, improved attention, improved awareness, and improved coordination.  Patient to discharge at an ambulatory level Supervision using RW. Patient's care partner is independent to provide the necessary physical and cognitive assistance at discharge.  All goals met.  Recommendation:  Patient will benefit from ongoing skilled PT services in outpatient setting to continue to advance safe functional mobility, address ongoing impairments in higher level dynamic standing balance, higher level dynamic gait training using LRAD, community reintegration, activity tolerance/endurance, and minimize fall risk.  Equipment: No equipment provided, pt has RW  Reasons for discharge: treatment goals met and discharge from hospital  Patient/family agrees with progress made and goals achieved: Yes  Skilled Therapeutic Interventions/Progress Updates:  Pt received supine in bed with his wife, [name], present for family education/training. Pt agreeable to therapy session. Supine>sitting EOB independently, no use of bed features. Therapist reinforced earlier education regarding pt's CLOF at supervision level using RW for functional mobility and that pt's primary impairments are cognitive in origin that impact pt's safety therefore requiring supervision. Educated on pt's L visual and inattention impairments and their impact on mobility and participation in daily tasks. Sit<>stands and stand pivot transfers using RW with supervision for safety during session - requires cuing for safe hand placement when using AD  and coming to stand or returning to sit. Pt's wife providing pt supervision throughout session with intermittent cuing for proper positioning. Gait ~151f x2 to/from therapy gym using RW - continues to demo excessive thoracic kyphotic posture with slower gait speed and decreased step lengths. Simulated ambulatory car transfer using RW (SUV height) with supervision and cuing/education on proper sequencing. Ambulated ~166fup/down ramp using RW with supervision. Therapist educated on proper stair navigation technique with pt's wife managing AD and providing close supervision to pt. Ascended/descended 4 steps 3x using R HR only to simulate home environment with therapist providing close supervision on 1st set then pt's wife providing supervision/CGA 2x demonstrating excellent understanding. Education on proper AD management and sequencing to navigate curb - pt/wife performed demonstrating excellent understanding with pt's wife providing CGA for safety during this task. Educated on fall risk safety, calling 911, signs/symptoms of stroke (BE FAST) as well as demonstrated how to safely get up off floor. Pt/wife appreciative of all the education/training and demonstrated excellent learning with no additional questions/concerns at this time. At end of session, pt left seated in recliner with needs in reach, seat belt alarm on, and pt's wife present.  PT Discharge Precautions/Restrictions Precautions Precautions: Fall;Other (comment) Precaution Comments: L hemianopsia, SBP goal <140, decreased safety awareness Restrictions Weight Bearing Restrictions: No Pain Pain Assessment Pain Scale: 0-10 Pain Score: 0-No pain Perception  Perception: Impaired Inattention/Neglect: Does not attend to left visual field Praxis Praxis: Impaired Praxis Impairment Details: Motor planning  Cognition Overall Cognitive Status: Impaired/Different from baseline Arousal/Alertness: Awake/alert Orientation Level: Oriented to  person;Oriented to place;Oriented to time;Oriented to situation ([facility] [place]: Focused;Sustained;Selective Focused Attention: Appears intact Sustained Attention: Impaired Selective Attention: Impaired Alternating Attention: Impaired  Memory: Impaired Awareness: Impaired Problem Solving: Impaired Safety/Judgment: Impaired Sensation Sensation Light Touch: Appears Intact Hot/Cold: Not tested Proprioception: Appears Intact (in LEs) Stereognosis: Not tested Coordination Gross Motor Movements are Fluid and Coordinated: Yes Coordination and Movement Description: GM movements are WFL in LEs Motor  Motor Motor: Motor apraxia;Hemiplegia;Abnormal postural alignment and control Motor - Discharge Observations: Still with mild 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  Mobility Bed Mobility Bed Mobility: Sit to Supine;Supine to Sit Supine to Sit: Independent Sit to Supine: Independent Transfers Transfers: Sit to Stand;Stand to Sit;Stand Pivot Transfers Sit to Stand: Supervision/Verbal cueing Stand to Sit: Supervision/Verbal cueing Stand Pivot Transfers: Supervision/Verbal cueing Stand Pivot Transfer Details: Verbal cues for sequencing;Verbal cues for technique;Verbal cues for safe use of DME/AE Transfer (Assistive device): Rolling walker Locomotion  Gait Ambulation: Yes Gait Assistance: Supervision/Verbal cueing Gait Distance (Feet): 150 Feet Assistive device: Rolling walker Gait Assistance Details: Verbal cues for safe use of DME/AE;Verbal cues for gait pattern;Verbal cues for technique Gait Gait: Yes Gait Pattern: Impaired Gait Pattern: Step-through pattern;Decreased step length - right;Decreased step length - left;Decreased trunk rotation Gait velocity: decreased Stairs / Additional Locomotion Stairs: Yes Stairs Assistance: Supervision/Verbal cueing Stair  Management Technique: One rail Right Number of Stairs: 8 Height of Stairs: 6 Ramp: Supervision/Verbal cueing Curb: Nurse, mental health Mobility: No  Trunk/Postural Assessment  Cervical Assessment Cervical Assessment: Exceptions to WFL (slight forward head) Thoracic Assessment Thoracic Assessment: Exceptions to [facility] (thoracic kyphosis) Lumbar Assessment Lumbar Assessment: Exceptions to [facility] (posterior pelvic tilt)  Balance Balance Balance Assessed: Yes Standardized Balance Assessment Standardized Balance Assessment: Functional Gait Assessment Static Sitting Balance Static Sitting - Balance Support: Feet supported;No upper extremity supported Static Sitting - Level of Assistance: 7: Independent Dynamic Sitting Balance Dynamic Sitting - Balance Support: During functional activity Dynamic Sitting - Level of Assistance: 5: Stand by assistance Static Standing Balance Static Standing - Balance Support: During functional activity Static Standing - Level of Assistance: 5: Stand by assistance Dynamic Standing Balance Dynamic Standing - Balance Support: During functional activity;Bilateral upper extremity supported Dynamic Standing - Level of Assistance: 5: Stand by assistance Functional Gait  Assessment Gait assessed : Yes Gait Level Surface: Walks 20 ft in less than 7 sec but greater than 5.5 sec, uses assistive device, slower speed, mild gait deviations, or deviates 6-10 in outside of the 12 in walkway width. Change in Gait Speed: Able to change speed, demonstrates mild gait deviations, deviates 6-10 in outside of the 12 in walkway width, or no gait deviations, unable to achieve a major change in velocity, or uses a change in velocity, or uses an assistive device. Gait with Horizontal Head Turns: Performs head turns with moderate changes in gait velocity, slows down, deviates 10-15 in outside 12 in walkway width but recovers, can continue to walk. (pt  states when he looks L/R then he has to "guess which way I'm going") Gait with Vertical Head Turns: Performs task with moderate change in gait velocity, slows down, deviates 10-15 in outside 12 in walkway width but recovers, can continue to walk. Gait and Pivot Turn: Pivot turns safely in greater than 3 sec and stops with no loss of balance, or pivot turns safely within 3 sec and stops with mild imbalance, requires small steps to catch balance. Step Over Obstacle: Is able to step over one shoe box (4.5 in total height) without changing gait speed. No evidence of imbalance. Gait with Narrow Base of Support: Ambulates less than 4 steps heel to toe or cannot perform without  assistance. Gait with Eyes Closed: Cannot walk 20 ft without assistance, severe gait deviations or imbalance, deviates greater than 15 in outside 12 in walkway width or will not attempt task. Ambulating Backwards: Walks 20 ft, uses assistive device, slower speed, mild gait deviations, deviates 6-10 in outside 12 in walkway width. Steps: Alternating feet, must use rail. Total Score: 14 Extremity Assessment      RLE Assessment RLE Assessment: Within Functional Limits Active Range of Motion (AROM) Comments: WFL/WNL General Strength Comments: 5/5 assessed in sitting RLE Strength Right Hip Flexion: 5/5 Right Knee Flexion: 5/5 Right Knee Extension: 5/5 Right Ankle Dorsiflexion: 5/5 Right Ankle Plantar Flexion: 5/5 LLE Assessment LLE Assessment: Within Functional Limits Active Range of Motion (AROM) Comments: WFL/WNL General Strength Comments: assessed in sitting LLE Strength Left Hip Flexion: 5/5 Left Knee Flexion: 5/5 Left Knee Extension: 5/5 Left Ankle Dorsiflexion: 5/5 (no sign of discomfort) Left Ankle Plantar Flexion: 5/5    Malyah Ohlrich M Dinari Stgermaine , PT, DPT, NCS, CSRS 04/19/2021, 7:53 AM

## 2021-04-19 NOTE — Discharge Summary (Signed)
Physician Discharge Summary  Patient ID: Geoffrey Wells MRN: [id number] DOB/AGE: Jan 12, 1946 75 y.o.  Admit date: 04/04/2021 Discharge date: 04/20/2021  Discharge Diagnoses:  Principal Problem:   Embolic stroke [facility]) Active Problems:   Dysphagia, post-stroke   Hyponatremia   New onset atrial fibrillation [facility])   Essential hypertension   Vascular headache   Discharged Condition: good  Significant Diagnostic Studies: DG Chest 2 View  Result Date: 04/06/2021 CLINICAL DATA:  Fevers EXAM: CHEST - 2 VIEW COMPARISON:  01/28/2020 FINDINGS: Cardiac shadow is mildly enlarged but accentuated by the frontal technique. Hiatal hernia is again identified and stable. Aortic calcifications are seen. Chronic scarring is noted in the right upper lobe but significantly improved when compared with the prior exam. Elevation of the right hemidiaphragm is again noted. No new focal infiltrate is seen. No acute bony abnormality is noted. IMPRESSION: Chronic scarring without acute abnormality. Electronically Signed   By: Inez Catalina M.D.   On: 04/06/2021 20:59   CT HEAD WO CONTRAST (5MM)  Result Date: 04/08/2021 CLINICAL DATA:  Mental status change, unknown cause. EXAM: CT HEAD WITHOUT CONTRAST TECHNIQUE: Contiguous axial images were obtained from the base of the skull through the vertex without intravenous contrast. COMPARISON:  Head CT and MRI 03/31/2021 FINDINGS: Brain: A right temporal lobe parenchymal hematoma has decreased in size and density with the residual hyperdense component measuring 2.8 x 1.3 x 2.7 cm. There is mild surrounding edema with residual mass effect on the temporal horn of the right lateral ventricle. Small volume subarachnoid hemorrhage on the prior studies has resolved. There are subacute right MCA and right PCA infarcts which demonstrate expected interval evolution since the previous studies. No definite new infarct, new intracranial hemorrhage, midline shift, or extra-axial fluid collection is  identified. Patchy hypodensities in the cerebral white matter bilaterally are nonspecific but compatible with moderate chronic small vessel ischemic disease. Mild cerebral atrophy is within normal limits for age. Vascular: Calcified atherosclerosis at the skull base. No hyperdense vessel. Skull: No fracture or suspicious osseous lesion. Sinuses/Orbits: Secretions in a posterior right ethmoid air cell. No significant mastoid fluid. Unremarkable orbits. Other: None. IMPRESSION: 1. Expected interval evolution of right temporal lobe parenchymal hematoma and subacute right MCA and right PCA infarcts. 2. Resolution of small volume subarachnoid hemorrhage. 3. No evidence of new intracranial abnormality. 4. Moderate chronic small vessel ischemic disease. Electronically Signed   By: Logan Bores M.D.   On: 04/08/2021 19:11    DG Swallowing Func-Speech Pathology  Result Date: 04/12/2021 Table formatting from the original result was not included. Objective Swallowing Evaluation: Type of Study: MBS-Modified Barium Swallow Study  Patient Details Name: Geoffrey Wells MRN: [id number] Date of Birth: 1945/12/31 Today's Date: 04/12/2021 Time: SLP Start Time (ACUTE ONLY): 1340 -SLP Stop Time (ACUTE ONLY): 1354 SLP Time Calculation (min) (ACUTE ONLY): 14 min Past Medical History: Past Medical History: Diagnosis Date  Dysphagia   due to radiation/weakness  Long COVID?   fatigue and "foggy brain"  Pneumonia due to COVID-19 virus 01/10/2020  hospitalization X 2  Pulmonary emboli (HCC)   post Covid  Throat cancer [facility])   Radiotactic surgery Past Surgical History: Past Surgical History: Procedure Laterality Date  HERNIA REPAIR   HPI: 75 yo male s/p admitted to [facility] with L hand weakness and L facial droop.  He is  s/p tPA with worsening of symptoms  post admistration.  He was found to have SAH, occlusion of P3 branch and acute parenchymal hemorrage with posterior R temporal lobe found  afterwards. Received reversal medication. PMHx: COVID PNA  with ongoing issues with SHOB.  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  Patient reports seeing ST for services at the [facility] to address swallowing defiicts post treatment.  Subjective: pt says he's feeling better Assessment / Plan / Recommendation CHL IP CLINICAL IMPRESSIONS 04/12/2021 Clinical Impression Patient presents with improved swallow function as compared to MBS completed on 7/31. He only exhibited one instance of very trace aspiration when attempting to take barium tablet with thin liquid barium which was sensed but did not appear to transit from trachea (PAS 7). This aspiration event appeared secondary to inability of patient to maintain bolus cohesion with tablet and thin liquid barium, resulting in decreased timing and coordination of swallow. During oral phase, he exhibited a mildly decreased mastication and transit of regular texture solids and although he did request paper towel to wipe his mouth one time, significant anterior spillage of liquid boluses was noted obseved as it had been in previous MBS. During pharyngeal phase, nectar thick liquids resulted in mild vallecular residuals and trace pyriform residuals and thin liquids resulted in mild vallecular and pyriform sinus residuals. Subsequent swallows did aid in eventual clearance of pharyngeal residuals. He exhibited mild-mod vallecular residuals with regular texture solids however with subsequent swallows and sips of thin liquid barium, this reduced to trace to mild. Cricopharyngeal relaxation was decreased leading to slow transit of boluses through UES and upper esophagus, but no backflow or retention of boluses observed in esophagus. SLP did not have patient perform any different postures and head was in neutral position for all boluses tested. SLP Visit Diagnosis Dysphagia, oropharyngeal phase (R13.12) Attention and concentration deficit following -- Frontal lobe  and executive function deficit following -- Impact on safety and function Mild aspiration risk   CHL IP TREATMENT RECOMMENDATION 04/01/2021 Treatment Recommendations Therapy as outlined in treatment plan below   Prognosis 04/01/2021 Prognosis for Safe Diet Advancement Good Barriers to Reach Goals Cognitive deficits Barriers/Prognosis Comment -- CHL IP DIET RECOMMENDATION 04/12/2021 SLP Diet Recommendations Dysphagia 3 (Mech soft) solids;Thin liquid Liquid Administration via Cup;Straw Medication Administration Whole meds with puree Compensations Minimize environmental distractions;Slow rate;Small sips/bites Postural Changes Seated upright at 90 degrees;Remain semi-upright after after feeds/meals (Comment)   CHL IP OTHER RECOMMENDATIONS 04/12/2021 Recommended Consults -- Oral Care Recommendations Oral care BID Other Recommendations --   CHL IP FOLLOW UP RECOMMENDATIONS 04/02/2021 Follow up Recommendations Inpatient Rehab   CHL IP FREQUENCY AND DURATION 04/02/2021 Speech Therapy Frequency (ACUTE ONLY) min 2x/week Treatment Duration --      CHL IP ORAL PHASE 04/12/2021 Oral Phase Impaired Oral - Pudding Teaspoon -- Oral - Pudding Cup -- Oral - Honey Teaspoon -- Oral - Honey Cup -- Oral - Nectar Teaspoon -- Oral - Nectar Cup WFL Oral - Nectar Straw NT Oral - Thin Teaspoon -- Oral - Thin Cup WFL Oral - Thin Straw WFL Oral - Puree WFL Oral - Mech Soft -- Oral - Regular Delayed oral transit Oral - Multi-Consistency -- Oral - Pill Decreased bolus cohesion Oral Phase - Comment --  CHL IP PHARYNGEAL PHASE 04/12/2021 Pharyngeal Phase Impaired Pharyngeal- Pudding Teaspoon -- Pharyngeal -- Pharyngeal- Pudding Cup -- Pharyngeal -- Pharyngeal- Honey Teaspoon -- Pharyngeal -- Pharyngeal- Honey Cup -- Pharyngeal -- Pharyngeal- Nectar Teaspoon -- Pharyngeal -- Pharyngeal- Nectar Cup Pharyngeal residue - valleculae;Pharyngeal residue - pyriform Pharyngeal -- Pharyngeal- Nectar Straw -- Pharyngeal -- Pharyngeal- Thin Teaspoon --  Pharyngeal --  Pharyngeal- Thin Cup Penetration/Aspiration during swallow;Pharyngeal residue - valleculae;Pharyngeal residue - pyriform;Trace aspiration Pharyngeal Material enters airway, passes BELOW cords and not ejected out despite cough attempt by patient Pharyngeal- Thin Straw Pharyngeal residue - valleculae;Pharyngeal residue - pyriform Pharyngeal Material does not enter airway Pharyngeal- Puree Pharyngeal residue - valleculae Pharyngeal -- Pharyngeal- Mechanical Soft -- Pharyngeal -- Pharyngeal- Regular Pharyngeal residue - valleculae;Pharyngeal residue - pyriform;Reduced tongue base retraction Pharyngeal -- Pharyngeal- Multi-consistency -- Pharyngeal -- Pharyngeal- Pill Delayed swallow initiation-vallecula Pharyngeal -- Pharyngeal Comment --  CHL IP CERVICAL ESOPHAGEAL PHASE 04/12/2021 Cervical Esophageal Phase Impaired Pudding Teaspoon -- Pudding Cup -- Honey Teaspoon -- Honey Cup -- Nectar Teaspoon -- Nectar Cup Reduced cricopharyngeal relaxation Nectar Straw Reduced cricopharyngeal relaxation Thin Teaspoon -- Thin Cup Reduced cricopharyngeal relaxation Thin Straw Reduced cricopharyngeal relaxation Puree Reduced cricopharyngeal relaxation Mechanical Soft -- Regular Reduced cricopharyngeal relaxation Multi-consistency -- Pill Reduced cricopharyngeal relaxation Cervical Esophageal Comment -- Sonia Baller, MA, CCC-SLP Speech Therapy              Labs:  Basic Metabolic Panel: BMP Latest Ref Rng & Units 04/16/2021 04/09/2021 04/06/2021  Glucose 70 - 99 mg/dL 111(H) 92 133(H)  BUN 8 - 23 mg/dL 12 12 27(H)  Creatinine 0.61 - 1.24 mg/dL 1.04 1.04 1.35(H)  Sodium 135 - 145 mmol/L 133(L) 134(L) 133(L)  Potassium 3.5 - 5.1 mmol/L 3.3(L) 4.5 3.8  Chloride 98 - 111 mmol/L 100 106 99  CO2 22 - 32 mmol/L 25 18(L) 25  Calcium 8.9 - 10.3 mg/dL 8.7(L) 8.8(L) 9.3     CBC: CBC Latest Ref Rng & Units 04/10/2021 04/09/2021 04/05/2021  WBC 4.0 - 10.5 K/uL 7.9 7.8 6.9  Hemoglobin 13.0 - 17.0 g/dL 13.0 12.9(L) 12.9(L)  Hematocrit 39.0  - 52.0 % 38.1(L) 38.8(L) 39.0  Platelets 150 - 400 K/uL 189 195 190     CBG: No results for input(s): GLUCAP in the last 168 hours.  Brief HPI:   Geoffrey Wells is a 75 y.o. male with history of throat cancer, ascending aortic aneurysm, COVID x2, PE, chronic pain s/p recent manipulation who was admitted on 03/30/2021 with weakness of left hand.  CT of head was negative.  CTA head was negative for dissection.  He received tPA and follow-up CT showed new acute IPH in posterior right temporal lobe, nearly occluded right P3 PCA branch and small volume acute SAH along temporal and occipital lobes.  He received cryoprecipitate and TXA due to ongoing bleeding and was transferred to [facility] for management.  MRI brain done showing multifocal infarcts involving right MCA/PCA distribution and 4.1 x 3.2 cm IPH in right temporal lobe with scattered small volume SAH as well as acute infarct mesial left occipital lobe.  BLE Dopplers negative for DVT.  2D echo showed EF 60 to 65% with moderate aortic regurg.  Hospital course was significant for issues with ongoing headache and neck pain as well as new onset A. fib. He was evaluated by cardiology  who recommended initiation of AC once cleared by neurology as well as 14-day cardiac monitor at discharge.  Dr. Leonie Man recommended starting ASA with repeat CT head in 1 month to decide on initiation of HD.  Patient with resultant hemianopsia with left-sided weakness, headaches, impulsivity with poor safety awareness as well as dysarthria with cognitive impairments and dysphagia requiring D3, nectar liquids.  Therapy was ongoing and CIR was recommended due to functional decline.   Hospital Course: Geoffrey Wells was admitted to rehab 04/04/2021  for inpatient therapies to consist of PT, ST and OT at least three hours five days a week. Past admission physiatrist, therapy team and rehab RN have worked together to provide customized collaborative inpatient rehab.  Swelling and  Dopplers at admission were negative for DVT and SCDs were used for DVT prophylaxis during his stay.  He was found to have AKI due to poor p.o. intake and was started with IV fluids at night to help with renal status.  Blood pressures were monitored on 3 times daily basis and he was also found to have orthostatic changes. [name] and metoprolol was discontinued and TEDS were added for BP support.  CT head was repeated on 08/07 due to confusion as well as hallucinations and this showed expected evolution of right temporal lobe IPH with resolution of small volume SAH.    He was also noted to have low-grade fevers therefore started on keflex was added empirically.   UA/urine culture ordered for work up and showed colonization therefore antibiotics were discontinued.  He did have dilutional hypokalemia with IV fluids and which was treated with addition of K-Dur.  As diet was advanced to D3 w/thins, his p.o. intake improved, and IVF were discontinued. Follow-up labs of IV fluid showed renal status to be stable.   His mentation has improved with increase in energy levels and he has made steady gains during his rehab stay.  He continues to be limited by left visual deficits with inattention and requires supervision with mobility and min assist with cognitive tasks. He will continue to receive follow up HHPT, HHOT and HHST by [facility] after discharge.    Rehab course: During patient's stay in rehab weekly team conferences were held to monitor patient's progress, set goals and discuss barriers to discharge. At admission, patient required min to mod assist with ADL tasks and with mobility. He exhibited significant cognitive linguistic deficits with left visual attention and SLUMS score of 12/30. He has had improvement in activity tolerance, balance, postural control as well as ability to compensate for deficits. He has had improvement in functional use LUE  and LLE as well as improvement in awareness.  He is able to  complete ADL tasks with supervision . He requires supervision with verbal cues for transfers and to ambulate 150 feet with rolling walker.  He requires supervision to min assist for basic cognitive tasks and mod assist for high-level tasks.  He is tolerating regular diet with thin liquids and is able to utilize safe swallow strategies at modified independent level.   Discharge disposition: 01-Home or Self Care  Diet:  Soft textures.    Special Instructions: No driving or strenuous activity. .  Discharge Instructions     Ambulatory referral to Cardiology   Complete by: As directed    Needs 14 day cardiac monitor to evaluate afib burden   Ambulatory referral to Neurology   Complete by: As directed    An appointment is requested in approximately: 2-3 weeks   Ambulatory referral to Physical Medicine Rehab   Complete by: As directed    1-2 weeks TC appt      Allergies as of 04/20/2021   No Known Allergies      Medication List     STOP taking these medications    5-HTP 200 MG Tabs   MELATONIN PO   metoprolol tartrate 25 MG tablet Commonly known as: LOPRESSOR   QUERCETIN PO   VITAMIN K2-VITAMIN D3 PO       TAKE  these medications    acetaminophen 325 MG tablet Commonly known as: TYLENOL Take 1-2 tablets (325-650 mg total) by mouth every 4 (four) hours as needed for mild pain.   aspirin 81 MG EC tablet Take 1 tablet (81 mg total) by mouth daily. Swallow whole.   atorvastatin 80 MG tablet Commonly known as: LIPITOR Take 1 tablet (80 mg total) by mouth daily.   b complex vitamins capsule Take 1 capsule by mouth daily.   Co Q10 100 MG Caps Take 1 capsule by mouth daily.   JUICE PLUS FIBRE PO Take 3 capsules by mouth in the morning and at bedtime.   Magnesium 500 MG Tabs Take 1 tablet (500 mg total) by mouth daily.   methocarbamol 500 MG tablet Commonly known as: ROBAXIN Take 1 tablet (500 mg total) by mouth every 6 (six) hours as needed for muscle  spasms.   pantoprazole 40 MG tablet Commonly known as: PROTONIX Take 1 tablet (40 mg total) by mouth daily.   PROBIOTIC PO Take 1 capsule by mouth daily.   topiramate 25 MG tablet Commonly known as: [name] Take 1 tablet (25 mg total) by mouth at bedtime.   traZODone 50 MG tablet Commonly known as: DESYREL Take 0.5-1 tablets (25-50 mg total) by mouth at bedtime as needed for sleep.   Zinc 50 MG Tabs Take 1 tablet by mouth daily.        Follow-up Information     Kirsteins, Luanna Salk, MD Follow up.   Specialty: Physical Medicine and Rehabilitation Why: Office will call you with follow up appointment Contact information: [place] [address] [phone]         [facility].   Specialty: General Practice Contact information: [place] [address] [phone]         [facility]. Call.   Why: for post stroke follow up Contact information: [address]     [place] [phone]                Signed: Bary Leriche 04/21/2021, 10:17 PM

## 2021-04-19 NOTE — Progress Notes (Signed)
PROGRESS NOTE   Subjective/Complaints:  SPoke with pt regarding rec not to make financial decisions while still recovering from CVA  ROS: Denies CP, SOB, N/V/D  Objective:   No results found. No results for input(s): WBC, HGB, HCT, PLT in the last 72 hours.  Recent Labs    04/16/21 0731  NA 133*  K 3.3*  CL 100  CO2 25  GLUCOSE 111*  BUN 12  CREATININE 1.04  CALCIUM 8.7*      Intake/Output Summary (Last 24 hours) at 04/19/2021 0714 Last data filed at 04/18/2021 1331 Gross per 24 hour  Intake 295 ml  Output --  Net 295 ml         Physical Exam: Vital Signs Blood pressure 112/63, pulse 72, temperature 97.7 F (36.5 C), temperature source Oral, resp. rate 18, height '6\' 1"'$  (1.854 m), weight 96.5 kg, SpO2 97 %.  General: No acute distress Mood and affect are appropriate Heart: Regular rate and rhythm no rubs murmurs or extra sounds Lungs: Clear to auscultation, breathing unlabored, no rales or wheezes Abdomen: Positive bowel sounds, soft nontender to palpation, nondistended Extremities: No clubbing, cyanosis, or edema Skin: No evidence of breakdown, no evidence of rash  Musc: No edema in extremities.  No tenderness in extremities. Neurologic: Alert Motor: RUE/RLE: 5/5 proximal distal LUE/LE: 4-4+/5 proximal to distal, unchanged No tactile inattention  Assessment/Plan: 1. Functional deficits which require 3+ hours per day of interdisciplinary therapy in a comprehensive inpatient rehab setting. Physiatrist is providing close team supervision and 24 hour management of active medical problems listed below. Physiatrist and rehab team continue to assess barriers to discharge/monitor patient progress toward functional and medical goals  Care Tool:  Bathing    Body parts bathed by patient: Right arm, Left arm, Chest, Abdomen, Front perineal area, Buttocks, Right upper leg, Left upper leg, Face     Body  parts n/a: Left upper leg, Right lower leg   Bathing assist Assist Level: Contact Guard/Touching assist     Upper Body Dressing/Undressing Upper body dressing   What is the patient wearing?: Pull over shirt    Upper body assist Assist Level: Set up assist    Lower Body Dressing/Undressing Lower body dressing      What is the patient wearing?: Underwear/pull up, Pants     Lower body assist Assist for lower body dressing: Contact Guard/Touching assist     Toileting Toileting Toileting Activity did not occur (Clothing management and hygiene only): N/A (no void or bm)  Toileting assist Assist for toileting: Contact Guard/Touching assist     Transfers Chair/bed transfer  Transfers assist     Chair/bed transfer assist level: Supervision/Verbal cueing Chair/bed transfer assistive device: Museum/gallery exhibitions officer assist      Assist level: Supervision/Verbal cueing Assistive device: Walker-rolling Max distance: 163f   Walk 10 feet activity   Assist     Assist level: Supervision/Verbal cueing Assistive device: Walker-rolling   Walk 50 feet activity   Assist    Assist level: Supervision/Verbal cueing Assistive device: Walker-rolling    Walk 150 feet activity   Assist Walk 150 feet activity did not occur: Safety/medical concerns (endurance)  Assist level: Supervision/Verbal cueing Assistive device: Walker-rolling    Walk 10 feet on uneven surface  activity   Assist Walk 10 feet on uneven surfaces activity did not occur:  (environmental - not accessible)         Wheelchair     Assist Will patient use wheelchair at discharge?: No             Wheelchair 50 feet with 2 turns activity    Assist            Wheelchair 150 feet activity     Assist          Blood pressure 112/63, pulse 72, temperature 97.7 F (36.5 C), temperature source Oral, resp. rate 18, height '6\' 1"'$  (1.854 m), weight 96.5 kg,  SpO2 97 %.  Medical Problem List and Plan: 1.  Embolic right MCA/PCA infarct   Continue CIR PT, OT,SLP plan d/c 8/19   2.  Impaired mobility -DVT/anticoagulation:  Mechanical: Sequential compression devices, below knee Bilateral lower extremities. Dopplers negative at admission--monitor for now.             -antiplatelet therapy: continue ASA 3. Headaches:  tylenol prn. Continue topamax '25mg'$  HS  Controlled with meds on 8/14 4. Mood: [name] to follow for evaluation and support.             -antipsychotic agents: N/A 5. Neuropsych: This patient may be intermittenly capable of making decisions on his own behalf. 6. Skin/Wound Care: Routine pressure relief measures. 7. Fluids/Electrolytes/Nutrition: Monitor I/O.  Mild hypoK add KCL, recheck in am  8. HTN: Monitor BP TID--on metoprolol             -likely volume related as BUN elevated   -pushing fluids, IVF at noc   Vitals:   04/18/21 1937 04/19/21 0600  BP: 112/63   Pulse: 72   Resp: 20 18  Temp: 97.9 F (36.6 C) 97.7 F (36.5 C)  SpO2: 97% 97%  Off zestril with low BP when standing, cont TEDs,d/c metoprolol and IVF- check BMET in am   9. New onset A fib: Monitor HR tid--d/c BB monitor HR              --On ASA with recs   10. Headaches/neck pain/Myalgias: Ice for neck pain. Low dose topamax for HA. --amantadine for activation.   11. Pre-diabetes: Hgb A1c- 5.8. Educate on CM diet. 12. Dyslipidemia:  Now on Lipitor for LDL 184. 13. Decreased initiation: started amantadine no clear cut benefit observed , DC'd. 14. 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   15.  Low grade temp resolved : ua negative, ucx with 5k staph lugdunensis and 10k diptheroids. 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  Sweating without fever: TSH normal, T4 with minimal elevation, T3 normal  17.  Hyponatremia  Sodium 134 on 8/9,repeat 133 like due to IVF, D/C IV 18.  Post stroke  dysphagia  D3 thins, advance diet as tolerated  LOS: 15 days A FACE TO FACE EVALUATION WAS PERFORMED  Charlett Blake 04/19/2021, 7:14 AM

## 2021-04-20 DIAGNOSIS — I63511 Cerebral infarction due to unspecified occlusion or stenosis of right middle cerebral artery: Secondary | ICD-10-CM

## 2021-04-20 LAB — BASIC METABOLIC PANEL
Anion gap: 8 (ref 5–15)
BUN: 12 mg/dL (ref 8–23)
CO2: 24 mmol/L (ref 22–32)
Calcium: 9.1 mg/dL (ref 8.9–10.3)
Chloride: 104 mmol/L (ref 98–111)
Creatinine, Ser: 1.1 mg/dL (ref 0.61–1.24)
GFR, Estimated: >60 mL/min (ref >60)
Glucose, Bld: 103 mg/dL — ABNORMAL HIGH (ref 70–99)
Potassium: 4.1 mmol/L (ref 3.5–5.1)
Sodium: 136 mmol/L (ref 135–145)

## 2021-04-20 NOTE — Progress Notes (Signed)
PROGRESS NOTE   Subjective/Complaints:    ROS: Denies CP, SOB, N/V/D  Objective:   No results found. No results for input(s): WBC, HGB, HCT, PLT in the last 72 hours.  No results for input(s): NA, K, CL, CO2, GLUCOSE, BUN, CREATININE, CALCIUM in the last 72 hours.    Intake/Output Summary (Last 24 hours) at 04/20/2021 0715 Last data filed at 04/19/2021 1940 Gross per 24 hour  Intake 530 ml  Output --  Net 530 ml         Physical Exam: Vital Signs Blood pressure 135/72, pulse 73, temperature 97.9 F (36.6 C), temperature source Oral, resp. rate 17, height '6\' 1"'$  (1.854 m), weight 96.5 kg, SpO2 98 %.  General: No acute distress Mood and affect are appropriate Heart: Regular rate and rhythm no rubs murmurs or extra sounds Lungs: Clear to auscultation, breathing unlabored, no rales or wheezes Abdomen: Positive bowel sounds, soft nontender to palpation, nondistended Extremities: No clubbing, cyanosis, or edema Skin: No evidence of breakdown, no evidence of rash  Musc: No edema in extremities.  No tenderness in extremities. Neurologic: Alert Motor: RUE/RLE: 5/5 proximal distal LUE/LE: 4-4+/5 proximal to distal, unchanged No tactile inattention  Assessment/Plan: 1. Functional deficits Stable for D/C today F/u PCP in 3-4 weeks F/u PM&R 2 weeks See D/C summary See D/C instructions  Care Tool:  Bathing    Body parts bathed by patient: Right arm, Left arm, Chest, Abdomen, Front perineal area, Buttocks, Right upper leg, Left upper leg, Face     Body parts n/a: Left upper leg, Right lower leg   Bathing assist Assist Level: Supervision/Verbal cueing     Upper Body Dressing/Undressing Upper body dressing   What is the patient wearing?: Pull over shirt    Upper body assist Assist Level: Set up assist    Lower Body Dressing/Undressing Lower body dressing      What is the patient wearing?: Underwear/pull  up, Pants     Lower body assist Assist for lower body dressing: Supervision/Verbal cueing     Toileting Toileting Toileting Activity did not occur (Clothing management and hygiene only): N/A (no void or bm)  Toileting assist Assist for toileting: Supervision/Verbal cueing     Transfers Chair/bed transfer  Transfers assist     Chair/bed transfer assist level: Supervision/Verbal cueing Chair/bed transfer assistive device: Museum/gallery exhibitions officer assist      Assist level: Supervision/Verbal cueing Assistive device: Walker-rolling Max distance: 157f   Walk 10 feet activity   Assist     Assist level: Supervision/Verbal cueing Assistive device: Walker-rolling   Walk 50 feet activity   Assist    Assist level: Supervision/Verbal cueing Assistive device: Walker-rolling    Walk 150 feet activity   Assist Walk 150 feet activity did not occur: Safety/medical concerns (endurance)  Assist level: Supervision/Verbal cueing Assistive device: Walker-rolling    Walk 10 feet on uneven surface  activity   Assist Walk 10 feet on uneven surfaces activity did not occur:  (environmental - not accessible)   Assist level: Supervision/Verbal cueing Assistive device: WAeronautical engineerWill patient use wheelchair at discharge?: No  Wheelchair 50 feet with 2 turns activity    Assist            Wheelchair 150 feet activity     Assist          Blood pressure 135/72, pulse 73, temperature 97.9 F (36.6 C), temperature source Oral, resp. rate 17, height '6\' 1"'$  (1.854 m), weight 96.5 kg, SpO2 98 %.  Medical Problem List and Plan: 1.  Embolic right MCA/PCA infarct   Continue CIR PT, OT,SLP plan d/c 8/19- await serum K prior to d/c to see if current KCL dose is appropriate    2.  Impaired mobility -DVT/anticoagulation:  Mechanical: Sequential compression devices, below knee Bilateral lower  extremities. Dopplers negative at admission--monitor for now.             -antiplatelet therapy: continue ASA 3. Headaches:  tylenol prn. Continue topamax '25mg'$  HS  Controlled with meds on 8/14 4. Mood: [name] to follow for evaluation and support.             -antipsychotic agents: N/A 5. Neuropsych: This patient may be intermittenly capable of making decisions on his own behalf. 6. Skin/Wound Care: Routine pressure relief measures. 7. Fluids/Electrolytes/Nutrition: Monitor I/O.  Mild hypoK add KCL, recheck in am  8. HTN: Monitor BP TID--on metoprolol             -likely volume related as BUN elevated   -pushing fluids, IVF at noc   Vitals:   04/19/21 1316 04/19/21 2004  BP: 105/60 135/72  Pulse: 77 73  Resp: 18 17  Temp: 97.6 F (36.4 C) 97.9 F (36.6 C)  SpO2: 96% 98%  Off zestril with low BP when standing, cont TEDs,d/c metoprolol and IVF- check BMET in am   9. New onset A fib: Monitor HR tid--d/c BB monitor HR              --On ASA with recs   10. Headaches/neck pain/Myalgias: Ice for neck pain. Low dose topamax for HA. --amantadine for activation.   11. Pre-diabetes: Hgb A1c- 5.8. Educate on CM diet. 12. Dyslipidemia:  Now on Lipitor for LDL 184. 13. Decreased initiation: started amantadine no clear cut benefit observed , DC'd. 14. 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   15.  Low grade temp resolved : ua negative, ucx with 5k staph lugdunensis and 10k diptheroids. 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  Sweating without fever: TSH normal, T4 with minimal elevation, T3 normal  17.  Hyponatremia  Sodium 134 on 8/9,repeat 133 like due to IVF, D/C IV 18.  Post stroke dysphagia  D3 thins, advance diet as tolerated  LOS: 16 days A FACE TO FACE EVALUATION WAS PERFORMED  Charlett Blake 04/20/2021, 7:15 AM

## 2021-04-20 NOTE — Progress Notes (Signed)
Administered morning medication before discharge. Patient and wife asked if had any questions. Patient and wife stated no. Patient wife and staff checked all drawers and room for belongs and packed up. IV removed from left forearm. No signs and symptoms of bleeding or infection noted. Patient discharged home with wife. Escorted to main entrance by nurse tech. Sanda Linger, LPN

## 2021-04-24 ENCOUNTER — Telehealth: Payer: Self-pay | Admitting: *Deleted

## 2021-04-24 NOTE — Telephone Encounter (Signed)
Transitional Care call--I spoke with his spouse Geoffrey Wells    Are you/is patient experiencing any problems since coming home? Are there any questions regarding any aspect of care? NO Are there any questions regarding medications administration/dosing? Are meds being taken as prescribed? Patient should review meds with caller to confirmYes  he has all but a couple that they are getting from the [place] Have there been any falls? NO Has Home Health been to the house and/or have they contacted you? If not, have you tried to contact them? Can we help you contact them? Yes he has had 2 visits already Are bowels and bladder emptying properly? Are there any unexpected incontinence issues? If applicable, is patient following bowel/bladder programs? NO problem Any fevers, problems with breathing, unexpected pain? NO Are there any skin problems or new areas of breakdown? NO Has the patient/family member arranged specialty MD follow up (ie cardiology/neurology/renal/surgical/etc)?  Can we help arrange? Appt given to see Dr Letta Pate, although she thinks he is supposed to see the [facility] first since they paid for the hospitalization Does the patient need any other services or support that we can help arrange? NO Are caregivers following through as expected in assisting the patient? YES Has the patient quit smoking, drinking alcohol, or using drugs as recommended?YES  Appointment time 05/09/21 @ 1:20 arrive @ 1:00 to see Danella Sensing NP Advised to be on look out for packet from our office in mail with paperwork and appt information [address]

## 2021-05-09 ENCOUNTER — Encounter: Payer: No Typology Code available for payment source | Admitting: Registered Nurse

## 2021-05-09 ENCOUNTER — Inpatient Hospital Stay: Payer: No Typology Code available for payment source | Admitting: Physical Medicine and Rehabilitation

## 2021-07-12 ENCOUNTER — Other Ambulatory Visit: Payer: Self-pay

## 2021-07-12 NOTE — Patient Outreach (Signed)
[place] [facility]) Care Management  07/12/2021  Vandell Kun 07-16-1946 [id number]   First telephone outreach attempt to obtain mRS. No answer. Left message for returned call.  Philmore Pali [facility] Management Assistant [phone]

## 2021-07-16 ENCOUNTER — Other Ambulatory Visit: Payer: Self-pay

## 2021-07-16 NOTE — Patient Outreach (Signed)
[name] [facility]) Care Management  07/16/2021  Geoffrey Wells 1945/09/15 [id number]   Second telephone outreach attempt to obtain mRS. No answer. Left message for returned call.  Philmore Pali [facility] Management Assistant [phone]

## 2021-07-18 ENCOUNTER — Other Ambulatory Visit: Payer: Self-pay

## 2021-07-18 NOTE — Patient Outreach (Signed)
[place] [facility]) Care Management  07/18/2021  Geoffrey Wells [birth year] [id number]   3 outreach attempts were completed to obtain mRs. mRs could not be obtained because patient never returned my calls. mRs=7    [place] Management Assistant [phone]

## 2021-10-31 IMAGING — MR MR HEAD W/O CM
4 of 5 series · 15 of 48 positions shown · non-contrast
Comparison: Prior CTs from 03/30/21.

CLINICAL DATA: Initial evaluation for acute headache, intracranial
hemorrhage.

EXAM:
MRI HEAD WITHOUT CONTRAST
TECHNIQUE: Multiplanar, multiecho pulse sequences of the brain and surrounding
structures were obtained without intravenous contrast.

[Series 3: DWI · axial · 3.0mm · 0.94mm/px · z∈[-67,+67]mm · 5 of 114 slices shown (1 of 2)]
[im 7/114]
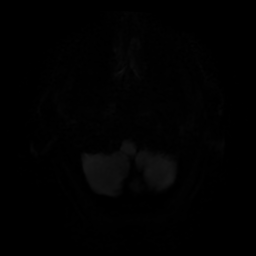
[im 20/114]
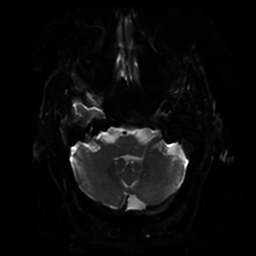
[im 34/114]
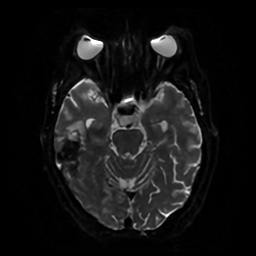
[im 60/114]
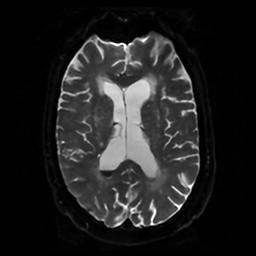
[im 100/114]
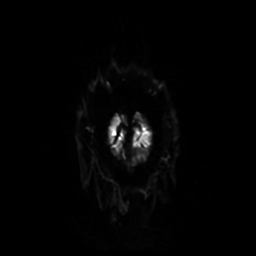

[Series 4: DWI · coronal · 4.0mm · 0.94mm/px · 3 of 83 slices shown (2 of 2)]
[im 15/83]
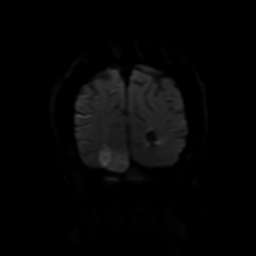
[im 45/83]
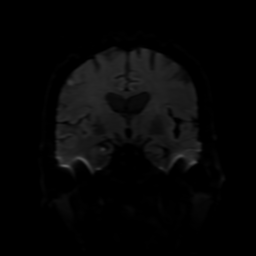
[im 75/83]
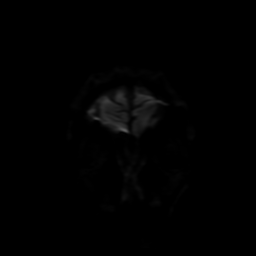

[Series 5: FLAIR · sagittal · 5.0mm · 0.23mm/px · 4 of 30 slices shown]
[im 1/30]
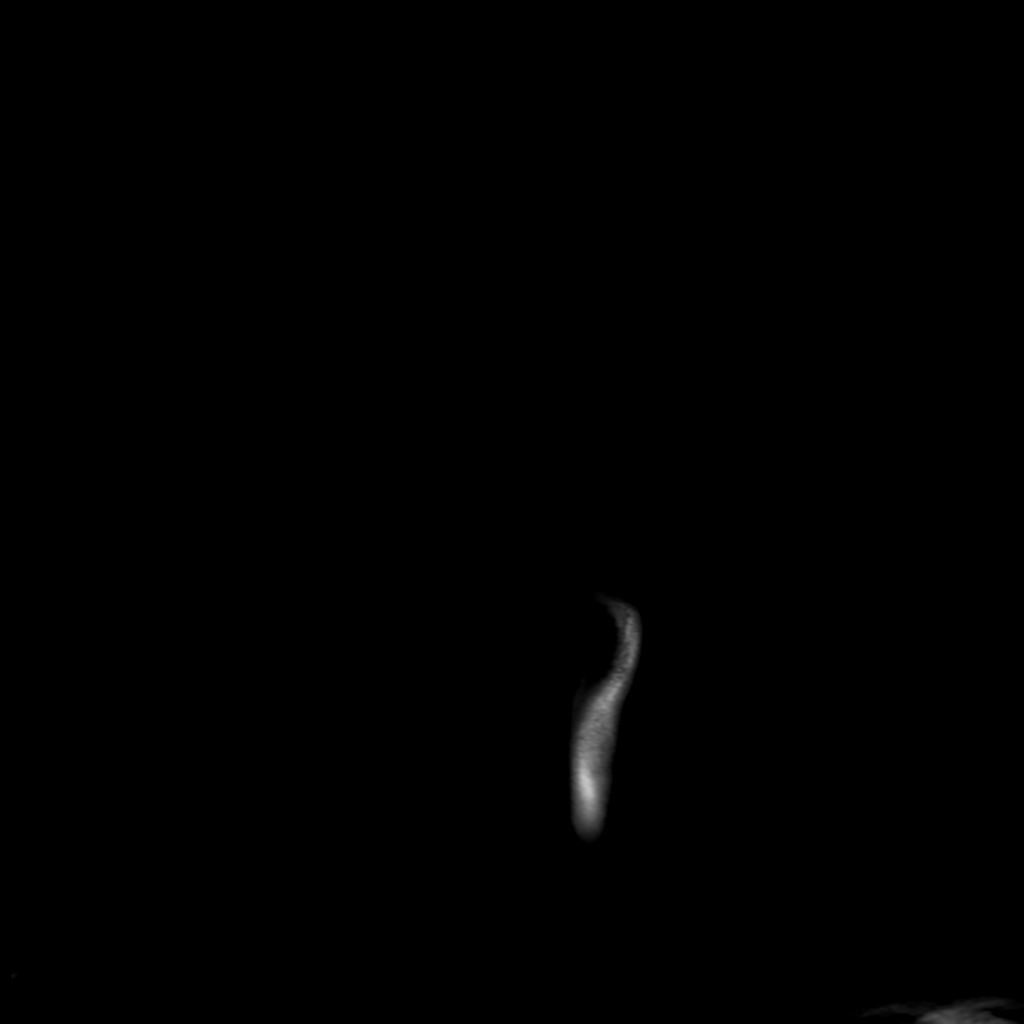
[im 10/30]
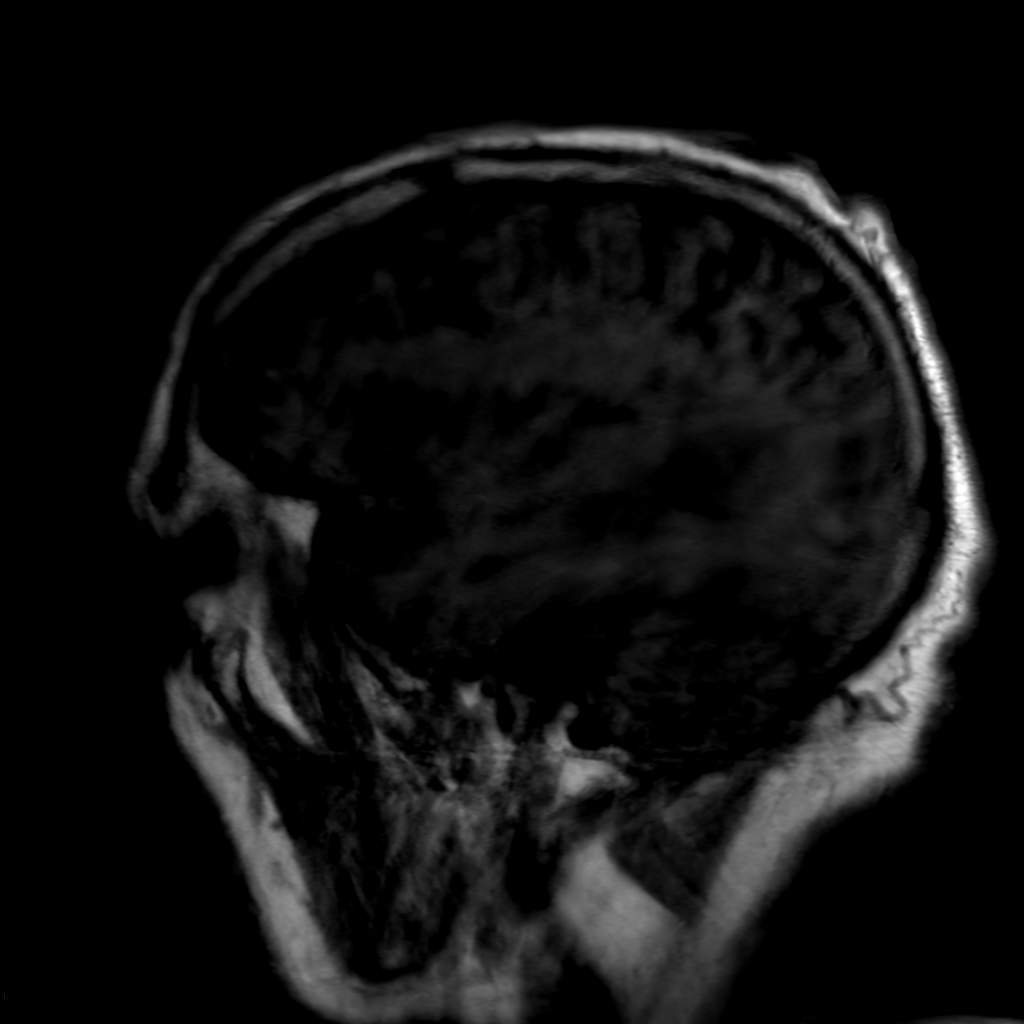
[im 20/30]
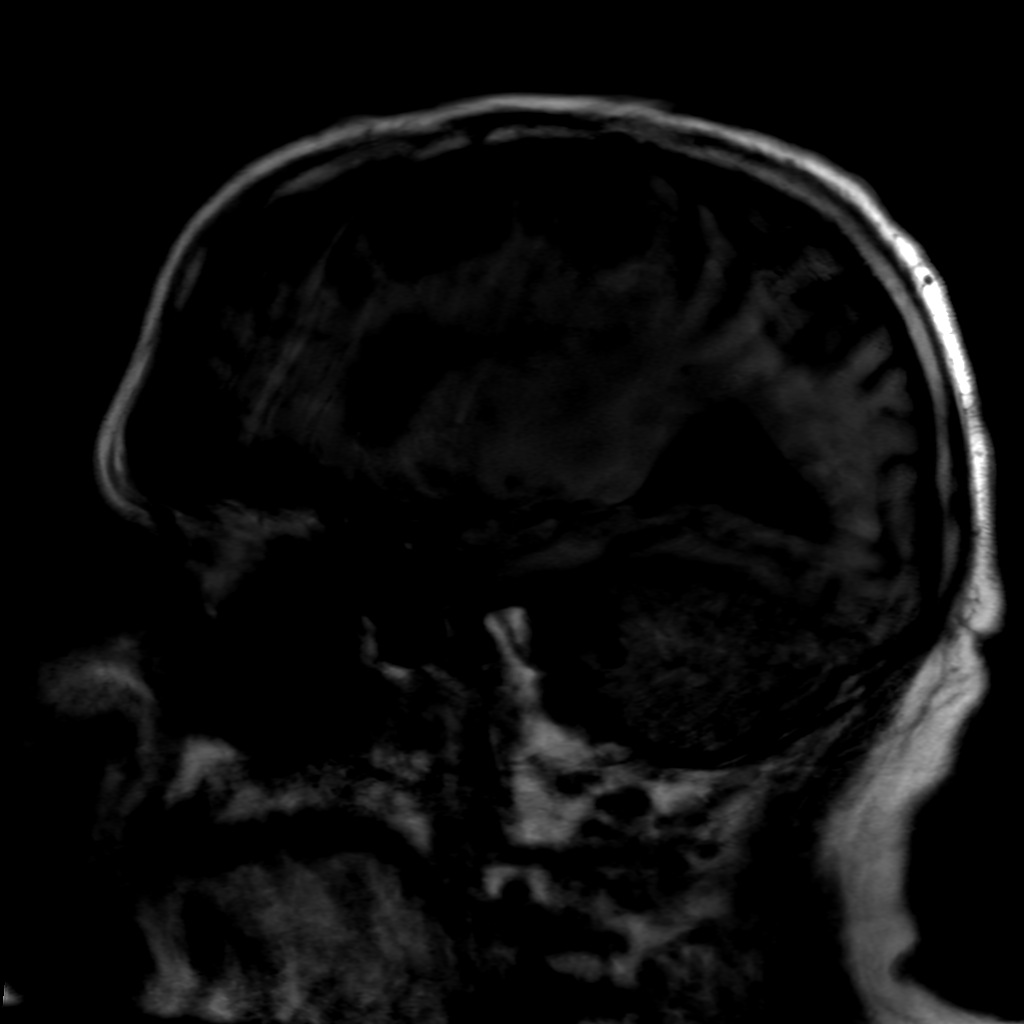
[im 30/30]
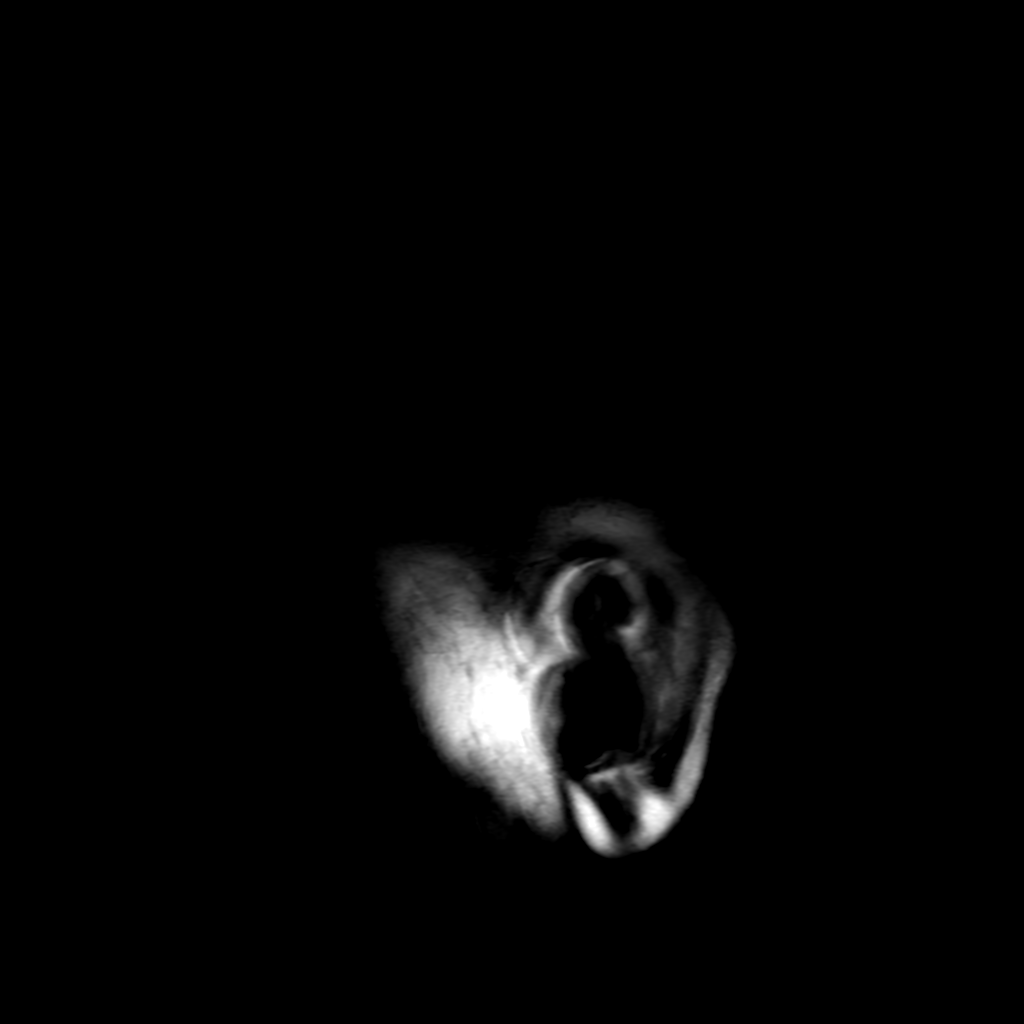

[Series 350: ADC · axial · 3.0mm · 0.94mm/px · z∈[-53,+64]mm · 3 of 57 slices shown]
[im 9/57]
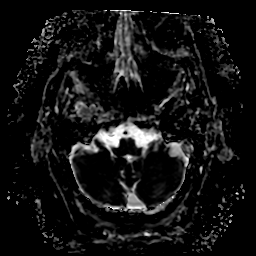
[im 33/57]
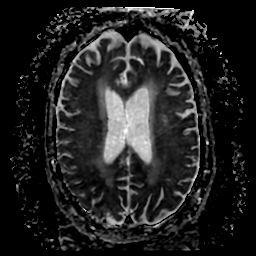
[im 49/57]
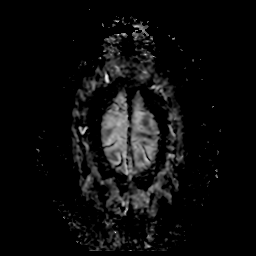

[15 of 48 positions shown; findings below may reference images not displayed]

FINDINGS: Brain: Examination technically limited as the patient was unable to
tolerate the full length of the study. Diffusion and sagittal T1
weighted sequences only were performed. Additionally, provided
images are moderately degraded by motion.

Multifocal ischemic infarcts involving the right frontal, parietal,
temporal, and occipital lobes are seen, right MCA and PCA
distributions. Largest areas of involvement seen at the posterior
right frontal region at the level of the precentral gyrus (series 3,
image 40). Additional moderate size confluent area of involvement at
the right occipital lobe, measuring up to 3.5 cm in size. Mild
patchy involvement of the right hippocampus. Few additional
scattered subcentimeter infarcts noted involving the periventricular
and deep white matter. Superimposed intraparenchymal hematoma at the
right temporal lobe is grossly similar, measuring approximately
x 3.2 cm on this limited exam. No visible underlying lesion.
Scattered small volume subarachnoid hemorrhage within the posterior
right temporal occipital region is grossly similar as well. Known
intraventricular extension not well seen on this exam. No
significant regional mass effect or midline shift. No hydrocephalus
or trapping. Basilar cisterns remain patent.

Additional note made of a 7 mm ischemic infarct involving the mesial
left occipital lobe (series 3, image 19).

No other visible acute intracranial abnormality seen on this limited
exam. No visible mass lesion or extra-axial fluid collection.

Vascular: Not assessed on this limited study.

Skull and upper cervical spine: Craniocervical junction within
normal limits. Bone marrow signal intensity normal. No scalp soft
tissue abnormality.

Sinuses/Orbits: Grossly negative on this limited exam.

Other: None.
IMPRESSION: 1. Technically limited exam due to patient's inability to tolerate
the full length of the study and motion artifact. Diffusion and
sagittal T1 weighted sequences only were performed.
2. Multifocal ischemic infarcts involving the right cerebral
hemisphere as above, right MCA and PCA distributions.
3. Grossly similar 4.1 x 3.2 cm intraparenchymal hematoma involving
the right temporal lobe. Scattered small volume subarachnoid
hemorrhage grossly similar as well. Known intraventricular extension
not well seen on this exam. No significant regional mass effect or
midline shift. No hydrocephalus or trapping.
4. Additional 7 mm acute ischemic infarct involving the mesial left
occipital lobe.

## 2022-06-30 ENCOUNTER — Other Ambulatory Visit: Payer: Self-pay

## 2022-06-30 ENCOUNTER — Emergency Department: Payer: No Typology Code available for payment source

## 2022-06-30 ENCOUNTER — Encounter: Payer: Self-pay | Admitting: Emergency Medicine

## 2022-06-30 ENCOUNTER — Emergency Department
Admission: EM | Admit: 2022-06-30 | Discharge: 2022-06-30 | Disposition: A | Discharge status: Home / Self Care | Payer: No Typology Code available for payment source | Attending: Emergency Medicine | Admitting: Emergency Medicine

## 2022-06-30 DIAGNOSIS — R4182 Altered mental status, unspecified: Secondary | ICD-10-CM | POA: Insufficient documentation

## 2022-06-30 DIAGNOSIS — Z8616 Personal history of COVID-19: Secondary | ICD-10-CM | POA: Insufficient documentation

## 2022-06-30 DIAGNOSIS — Z7982 Long term (current) use of aspirin: Secondary | ICD-10-CM | POA: Insufficient documentation

## 2022-06-30 DIAGNOSIS — Y906 Blood alcohol level of 120-199 mg/100 ml: Secondary | ICD-10-CM | POA: Diagnosis not present

## 2022-06-30 DIAGNOSIS — F10120 Alcohol abuse with intoxication, uncomplicated: Secondary | ICD-10-CM | POA: Insufficient documentation

## 2022-06-30 DIAGNOSIS — F10920 Alcohol use, unspecified with intoxication, uncomplicated: Secondary | ICD-10-CM

## 2022-06-30 DIAGNOSIS — Z8521 Personal history of malignant neoplasm of larynx: Secondary | ICD-10-CM | POA: Diagnosis not present

## 2022-06-30 DIAGNOSIS — R7309 Other abnormal glucose: Secondary | ICD-10-CM | POA: Insufficient documentation

## 2022-06-30 LAB — COMPREHENSIVE METABOLIC PANEL
ALT: 17 U/L (ref 0–44)
AST: 31 U/L (ref 15–41)
Albumin: 4 g/dL (ref 3.5–5.0)
Alkaline Phosphatase: 68 U/L (ref 38–126)
Anion gap: 11 (ref 5–15)
BUN: 17 mg/dL (ref 8–23)
CO2: 27 mmol/L (ref 22–32)
Calcium: 9.7 mg/dL (ref 8.9–10.3)
Chloride: 106 mmol/L (ref 98–111)
Creatinine, Ser: 1.22 mg/dL (ref 0.61–1.24)
GFR, Estimated: >60 mL/min (ref >60)
Glucose, Bld: 83 mg/dL (ref 70–99)
Potassium: 3.7 mmol/L (ref 3.5–5.1)
Sodium: 144 mmol/L (ref 135–145)
Total Bilirubin: 0.9 mg/dL (ref 0.3–1.2)
Total Protein: 7.2 g/dL (ref 6.5–8.1)

## 2022-06-30 LAB — CBG MONITORING, ED: Glucose-Capillary: 81 mg/dL (ref 70–99)

## 2022-06-30 LAB — URINALYSIS, ROUTINE W REFLEX MICROSCOPIC
Bilirubin Urine: NEGATIVE
Glucose, UA: NEGATIVE mg/dL
Hgb urine dipstick: NEGATIVE
Ketones, ur: NEGATIVE mg/dL
Leukocytes,Ua: NEGATIVE
Nitrite: NEGATIVE
Protein, ur: NEGATIVE mg/dL
Specific Gravity, Urine: 1.004 — ABNORMAL LOW (ref 1.005–1.030)
pH: 5 (ref 5.0–8.0)

## 2022-06-30 LAB — CBC
HCT: 42 % (ref 39.0–52.0)
Hemoglobin: 13.7 g/dL (ref 13.0–17.0)
MCH: 28.6 pg (ref 26.0–34.0)
MCHC: 32.6 g/dL (ref 30.0–36.0)
MCV: 87.7 fL (ref 80.0–100.0)
Platelets: 151 10*3/uL (ref 150–400)
RBC: 4.79 MIL/uL (ref 4.22–5.81)
RDW: 15.5 % (ref 11.5–15.5)
WBC: 5 10*3/uL (ref 4.0–10.5)
nRBC: 0 % (ref 0.0–0.2)

## 2022-06-30 LAB — DIFFERENTIAL
Abs Immature Granulocytes: 0.01 10*3/uL (ref 0.00–0.07)
Basophils Absolute: 0 10*3/uL (ref 0.0–0.1)
Basophils Relative: 1 %
Eosinophils Absolute: 0.1 10*3/uL (ref 0.0–0.5)
Eosinophils Relative: 1 %
Immature Granulocytes: 0 %
Lymphocytes Relative: 44 %
Lymphs Abs: 2.2 10*3/uL (ref 0.7–4.0)
Monocytes Absolute: 0.5 10*3/uL (ref 0.1–1.0)
Monocytes Relative: 9 %
Neutro Abs: 2.3 10*3/uL (ref 1.7–7.7)
Neutrophils Relative %: 45 %

## 2022-06-30 LAB — PROTIME-INR
INR: 1.1 (ref 0.8–1.2)
Prothrombin Time: 13.9 seconds (ref 11.4–15.2)

## 2022-06-30 LAB — APTT: aPTT: 33 seconds (ref 24–36)

## 2022-06-30 LAB — URINE DRUG SCREEN, QUALITATIVE (ARMC ONLY)
Amphetamines, Ur Screen: NOT DETECTED
Barbiturates, Ur Screen: NOT DETECTED
Benzodiazepine, Ur Scrn: NOT DETECTED
Cannabinoid 50 Ng, Ur ~~LOC~~: NOT DETECTED
Cocaine Metabolite,Ur ~~LOC~~: NOT DETECTED
MDMA (Ecstasy)Ur Screen: NOT DETECTED
Methadone Scn, Ur: NOT DETECTED
Opiate, Ur Screen: NOT DETECTED
Phencyclidine (PCP) Ur S: NOT DETECTED
Tricyclic, Ur Screen: NOT DETECTED

## 2022-06-30 LAB — ETHANOL: Alcohol, Ethyl (B): 159 mg/dL — ABNORMAL HIGH (ref <10)

## 2022-06-30 NOTE — ED Triage Notes (Signed)
Pt to ED via [facility] with wife. Pt's wife reports LKW at 2000 yesterday. Reports last night pt drank 3 beers after LKW. Pt's wife reports symptoms noticed at 0100 today. CBG 81 in triage. Pt with noted labile emotions, repeated questions at this time, pt's wife reports increased confusion. Pt intermittently between laughing and crying on assessment.    Dr. Jacelyn Grip to triage to assess patient.   Facial symmetry in tact. Grip strength equal bilaterally. No aphasia, no dysarthria.   Per Dr. Jacelyn Grip, no  code stroke.

## 2022-06-30 NOTE — ED Provider Notes (Signed)
[facility] Provider Note    Event Date/Time   First MD Initiated Contact with Patient 06/30/22 0211     (approximate)   History   Altered Mental Status   HPI  Geoffrey Wells is a 76 y.o. male with history of throat cancer status post radiation, PE after COVID no longer on anticoagulation who presents to the emergency department with his wife for altered mental status.  Patient's wife states she last saw him normal around 8 PM.  States that she went to bed around 11 PM and he stayed up watching television and drinking beer.  States he had approximately 3 beers.  She states when she woke up just prior to arrival that he had slurred speech, would laugh inappropriately, was talking rapidly.  She states that she thought he may have had a little bit of the left-sided facial droop but states that he has had this previously before as well.  She states that she tried to get up to the bathroom to pee and he had a staggering gait and started pulling his pants down prior to getting to the bathroom.  She states she is not sure if he was just intoxicated or if he was having a stroke.  She called the on-call nurse for the [facility] who recommended they come to the hospital.  No headache or head injury.  No chest pain or shortness of breath.  No numbness, tingling or weakness.  Patient denies feeling intoxicated.   History provided by patient and wife.    Past Medical History:  Diagnosis Date   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    fatigue and "foggy brain"   Pneumonia due to COVID-19 virus 01/10/2020   hospitalization X 2   Pulmonary emboli (HCC)    post Covid   Throat cancer [facility])    Radiotactic surgery    Past Surgical History:  Procedure Laterality Date   HERNIA REPAIR      MEDICATIONS:  Prior to Admission medications   Medication Sig Start Date End Date Taking? Authorizing Provider  acetaminophen (TYLENOL) 325 MG tablet Take 1-2 tablets (325-650 mg  total) by mouth every 4 (four) hours as needed for mild pain. 04/09/21   Love, Ivan Anchors, PA-C  aspirin EC 81 MG EC tablet Take 1 tablet (81 mg total) by mouth daily. Swallow whole. 04/05/21   Einar Pheasant, NP  atorvastatin (LIPITOR) 80 MG tablet Take 1 tablet (80 mg total) by mouth daily. 04/04/21   Einar Pheasant, NP  b complex vitamins capsule Take 1 capsule by mouth daily.    [provider]  Coenzyme Q10 (CO Q10) 100 MG CAPS Take 1 capsule by mouth daily.    [provider]  Magnesium 500 MG TABS Take 1 tablet (500 mg total) by mouth daily. 04/19/21   Angiulli, Lavon Paganini, PA-C  methocarbamol (ROBAXIN) 500 MG tablet Take 1 tablet (500 mg total) by mouth every 6 (six) hours as needed for muscle spasms. 04/19/21   Angiulli, Lavon Paganini, PA-C  Nutritional Supplements (JUICE PLUS FIBRE PO) Take 3 capsules by mouth in the morning and at bedtime.    [provider]  pantoprazole (PROTONIX) 40 MG tablet Take 1 tablet (40 mg total) by mouth daily. 04/19/21   Angiulli, Lavon Paganini, PA-C  Probiotic Product (PROBIOTIC PO) Take 1 capsule by mouth daily.    [provider]  topiramate ([name]) 25 MG tablet Take 1 tablet (25  mg total) by mouth at bedtime. 04/19/21   Angiulli, Lavon Paganini, PA-C  traZODone (DESYREL) 50 MG tablet Take 0.5-1 tablets (25-50 mg total) by mouth at bedtime as needed for sleep. 04/19/21   Angiulli, Lavon Paganini, PA-C  Zinc 50 MG TABS Take 1 tablet by mouth daily.    [provider]    Physical Exam   Triage Vital Signs: ED Triage Vitals [06/30/22 0154]  Enc Vitals Group     BP (!) 141/81     Pulse Rate 63     Resp 20     Temp 97.6 F (36.4 C)     Temp Source Oral     SpO2 98 %     Weight 210 lb (95.3 kg)     Height '6\' 1"'$  (1.854 m)     Head Circumference      Peak Flow      Pain Score 0     Pain Loc      Pain Edu?      Excl. in [place]?     Most recent vital signs: Vitals:   06/30/22 0312 06/30/22 0447  BP: 130/69 136/74  Pulse: (!) 57 (!)  54  Resp: 15 16  Temp:  98.2 F (36.8 C)  SpO2: 99% 98%    CONSTITUTIONAL: Alert and oriented and responds appropriately to questions.  Elderly, laughing intermittently HEAD: Normocephalic, atraumatic EYES: Conjunctivae clear, pupils appear equal, sclera nonicteric ENT: normal nose; moist mucous membranes NECK: Supple, normal ROM CARD: RRR; S1 and S2 appreciated; no murmurs, no clicks, no rubs, no gallops RESP: Normal chest excursion without splinting or tachypnea; breath sounds clear and equal bilaterally; no wheezes, no rhonchi, no rales, no hypoxia or respiratory distress, speaking full sentences ABD/GI: Normal bowel sounds; non-distended; soft, non-tender, no rebound, no guarding, no peritoneal signs BACK: The back appears normal EXT: Normal ROM in all joints; no deformity noted, no edema; no cyanosis SKIN: Normal color for age and race; warm; no rash on exposed skin NEURO: Moves all extremities equally, normal speech, no dysmetria to finger-nose testing bilaterally, mild fatigable horizontal nystagmus bilaterally, no drift, reports normal sensation diffusely, mild dysarthria but no aphasia, cranial nerves II through XII otherwise intact PSYCH: The patient's mood and manner are appropriate.   ED Results / Procedures / Treatments   LABS: (all labs ordered are listed, but only abnormal results are displayed) Labs Reviewed  ETHANOL - Abnormal; Notable for the following components:      Result Value   Alcohol, Ethyl (B) 159 (*)    All other components within normal limits  URINALYSIS, ROUTINE W REFLEX MICROSCOPIC - Abnormal; Notable for the following components:   Color, Urine STRAW (*)    APPearance CLEAR (*)    Specific Gravity, Urine 1.004 (*)    All other components within normal limits  PROTIME-INR  APTT  CBC  DIFFERENTIAL  COMPREHENSIVE METABOLIC PANEL  URINE DRUG SCREEN, QUALITATIVE ([facility] ONLY)  CBG MONITORING, ED     EKG:  EKG  Interpretation  Date/Time:  Sunday June 30 2022 02:00:14 EDT Ventricular Rate:  62 PR Interval:  162 QRS Duration: 104 QT Interval:  456 QTC Calculation: 462 R Axis:   60 Text Interpretation: Normal sinus rhythm Cannot rule out Anterior infarct , age undetermined Abnormal ECG When compared with ECG of 30-Mar-2021 10:40, PREVIOUS ECG IS PRESENT Confirmed by Pryor Curia [phone]) on 06/30/2022 2:18:16 AM         RADIOLOGY: My personal review and interpretation of  imaging: CT head shows no acute abnormality.  I have personally reviewed all radiology reports.   CT HEAD WO CONTRAST  Result Date: 06/30/2022 CLINICAL DATA:  Mental status change, unknown cause EXAM: CT HEAD WITHOUT CONTRAST TECHNIQUE: Contiguous axial images were obtained from the base of the skull through the vertex without intravenous contrast. RADIATION DOSE REDUCTION: This exam was performed according to the departmental dose-optimization program which includes automated exposure control, adjustment of the mA and/or kV according to patient size and/or use of iterative reconstruction technique. COMPARISON:  04/08/2021 FINDINGS: Brain: Old right occipital and parietal infarcts, stable. Area of encephalomalacia in the right posterior temporal lobe in area of prior hemorrhage. There is atrophy and chronic small vessel disease changes. No acute intracranial abnormality. Specifically, no hemorrhage, hydrocephalus, mass lesion, acute infarction, or significant intracranial injury. Vascular: No hyperdense vessel or unexpected calcification. Skull: No acute calvarial abnormality. Sinuses/Orbits: No acute findings Other: None IMPRESSION: Old right cerebral infarcts. Atrophy, chronic microvascular disease. No acute intracranial abnormality. Electronically Signed   By: Rolm Baptise M.D.   On: 06/30/2022 02:33     PROCEDURES:  Critical Care performed: No     Procedures    IMPRESSION / MDM / ASSESSMENT AND PLAN / ED COURSE  I  reviewed the triage vital signs and the nursing notes.    Patient here with altered mental status.  The patient is on the cardiac monitor to evaluate for evidence of arrhythmia and/or significant heart rate changes.   DIFFERENTIAL DIAGNOSIS (includes but not limited to):   Suspect patient is intoxicated.  Differential does include CVA given wife who is concerned for some left-sided facial droop that is not present currently.  Also includes TIA, UTI, anemia, electrolyte derangement, dehydration   Patient's presentation is most consistent with acute presentation with potential threat to life or bodily function.   PLAN: Code stroke work-up initiated from triage.  Stroke scale here is 1 due to dysarthria.  Outside of tPA window.  I suspect that he is likely intoxicated.  We discussed performing MRI of the brain but wife would like to see what his ethanol level comes back as and if it is elevated and he is improving with time, will hold off on further brain imaging.  Head CT has been obtained and reviewed/interpreted by myself and the radiologist and shows no acute abnormality.   MEDICATIONS GIVEN IN ED: Medications - No data to display   ED COURSE: Patient's labs show no acute abnormality other than alcohol level of 159.  Normal hemoglobin, electrolytes, renal function.  Urine shows no sign of infection and drug screen negative.  CT head reviewed and interpreted by myself and the radiologist and shows no acute abnormality.  Patient now able to tolerate p.o., ambulate without difficulty wife reports he is back to his baseline.  I suspect that his symptoms were due to alcohol intoxication and she agrees.  They are comfortable with plan for discharge home with outpatient follow-up as needed.   At this time, I do not feel there is any life-threatening condition present. I reviewed all nursing notes, vitals, pertinent previous records.  All lab and urine results, EKGs, imaging ordered have been  independently reviewed and interpreted by myself.  I reviewed all available radiology reports from any imaging ordered this visit.  Based on my assessment, I feel the patient is safe to be discharged home without further emergent workup and can continue workup as an outpatient as needed. Discussed all findings, treatment plan as  well as usual and customary return precautions.  They verbalize understanding and are comfortable with this plan.  Outpatient follow-up has been provided as needed.  All questions have been answered.    CONSULTS:  none   OUTSIDE RECORDS REVIEWED: [facility]23.       FINAL CLINICAL IMPRESSION(S) / ED DIAGNOSES   Final diagnoses:  Altered mental status, unspecified altered mental status type  Alcoholic intoxication without complication ([place])     Rx / DC Orders   ED Discharge Orders     None        Note:  This document was prepared using Dragon voice recognition software and may include unintentional dictation errors.   Kimela Malstrom, Delice Bison, DO 06/30/22 [phone]

## 2022-06-30 NOTE — ED Notes (Signed)
Informed patient urine is needed. Patient states he just used the restroom. Water given to patient to facilitate urine production.

## 2022-08-16 MED ORDER — METHYLPREDNISOLONE SODIUM SUCC 125 MG IJ SOLR
INTRAMUSCULAR | Status: AC
  Filled 2022-08-16: qty 2

## 2022-08-16 MED ORDER — FAMOTIDINE 20 MG PO TABS
ORAL_TABLET | ORAL | Status: AC
  Filled 2022-08-16: qty 2

## 2022-08-16 MED ORDER — DIPHENHYDRAMINE HCL 50 MG/ML IJ SOLN
INTRAMUSCULAR | Status: AC
  Filled 2022-08-16: qty 1

## 2023-02-17 ENCOUNTER — Encounter: Payer: Self-pay | Admitting: *Deleted

## 2023-02-17 ENCOUNTER — Encounter: Payer: No Typology Code available for payment source | Attending: Nurse Practitioner | Admitting: *Deleted

## 2023-02-17 DIAGNOSIS — E785 Hyperlipidemia, unspecified: Secondary | ICD-10-CM | POA: Insufficient documentation

## 2023-02-17 DIAGNOSIS — F32A Depression, unspecified: Secondary | ICD-10-CM | POA: Insufficient documentation

## 2023-02-17 DIAGNOSIS — Z48812 Encounter for surgical aftercare following surgery on the circulatory system: Secondary | ICD-10-CM | POA: Insufficient documentation

## 2023-02-17 DIAGNOSIS — I7121 Aneurysm of the ascending aorta, without rupture: Secondary | ICD-10-CM | POA: Insufficient documentation

## 2023-02-17 DIAGNOSIS — Z955 Presence of coronary angioplasty implant and graft: Secondary | ICD-10-CM | POA: Insufficient documentation

## 2023-02-17 NOTE — Progress Notes (Signed)
Initial phone call completed. Dx can be found in media scan dated 02/24/23. EP orientation scheduled for 6/24 at 3:00 pm.

## 2023-02-24 ENCOUNTER — Encounter: Payer: No Typology Code available for payment source | Admitting: *Deleted

## 2023-02-24 VITALS — Ht 70.5 in | Wt 210.5 lb

## 2023-02-24 DIAGNOSIS — Z955 Presence of coronary angioplasty implant and graft: Secondary | ICD-10-CM

## 2023-02-24 DIAGNOSIS — Z48812 Encounter for surgical aftercare following surgery on the circulatory system: Secondary | ICD-10-CM | POA: Diagnosis not present

## 2023-02-24 NOTE — Progress Notes (Signed)
Cardiac Individual Treatment Plan  Patient Details  Name: Geoffrey Wells MRN: [id number] Date of Birth: Jan 25, 1946 Referring Provider:   Flowsheet Row Cardiac Rehab from 02/24/2023 in [facility] Cardiac and Pulmonary Rehab  Referring Provider [name]       Initial Encounter Date:  Flowsheet Row Cardiac Rehab from 02/24/2023 in [facility] Cardiac and Pulmonary Rehab  Date 02/24/23       Visit Diagnosis: Status post coronary artery stent placement  Patient's Home Medications on Admission:  Current Outpatient Medications:    acetaminophen (TYLENOL) 325 MG tablet, Take 1-2 tablets (325-650 mg total) by mouth every 4 (four) hours as needed for mild pain., Disp: , Rfl:    apixaban (ELIQUIS) 5 MG TABS tablet, Take 5 mg by mouth 2 (two) times daily., Disp: , Rfl:    b complex vitamins capsule, Take 1 capsule by mouth daily., Disp: , Rfl:    clopidogrel (PLAVIX) 75 MG tablet, Take 75 mg by mouth daily., Disp: , Rfl:    Coenzyme Q10 (CO Q10) 100 MG CAPS, Take 1 capsule by mouth daily., Disp: , Rfl:    Magnesium 500 MG TABS, Take 1 tablet (500 mg total) by mouth daily., Disp: 30 tablet, Rfl: 0   metoprolol succinate (TOPROL-XL) 50 MG 24 hr tablet, Take 25 mg by mouth daily., Disp: , Rfl:    Nutritional Supplements (JUICE PLUS FIBRE PO), Take 3 capsules by mouth in the morning and at bedtime., Disp: , Rfl:    omega-3 acid ethyl esters (LOVAZA) 1 g capsule, Take by mouth 2 (two) times daily., Disp: , Rfl:    rosuvastatin (CRESTOR) 20 MG tablet, Take 20 mg by mouth daily., Disp: , Rfl:    Vitamin D-Vitamin K (VITAMIN D2 + K1 PO), Take by mouth., Disp: , Rfl:   Past Medical History: Past Medical History:  Diagnosis Date   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    fatigue and "foggy brain"   Pneumonia due to COVID-19 virus 01/10/2020   hospitalization X 2   Pulmonary emboli (HCC)    post Covid   Throat cancer (HCC)    Radiotactic surgery    Tobacco Use: Social History    Tobacco Use  Smoking Status Former   Packs/day: 0.50   Years: 25.00   Additional pack years: 0.00   Total pack years: 12.50   Types: Cigarettes  Smokeless Tobacco Never    Labs: Review Flowsheet       Latest Ref Rng & Units 01/19/2020 03/30/2021  Labs for ITP Cardiac and Pulmonary Rehab  Cholestrol 0 - 200 mg/dL - 409   LDL (calc) 0 - 99 mg/dL - 811   HDL-C >91 mg/dL - 67   Trlycerides <478 mg/dL - 89   Hemoglobin G9F 4.8 - 5.6 % - 5.8   Bicarbonate 20.0 - 28.0 mmol/L 24.1  -  Acid-base deficit 0.0 - 2.0 mmol/L 0.3  -  O2 Saturation % 69.7  -     Exercise Target Goals: Exercise Program Goal: Individual exercise prescription set using results from initial 6 min walk test and THRR while considering  patient's activity barriers and safety.   Exercise Prescription Goal: Initial exercise prescription builds to 30-45 minutes a day of aerobic activity, 2-3 days per week.  Home exercise guidelines will be given to patient during program as part of exercise prescription that the participant will acknowledge.   Education: Aerobic Exercise: - Group verbal and visual presentation on the components of exercise  prescription. Introduces F.I.T.T principle from ACSM for exercise prescriptions.  Reviews F.I.T.T. principles of aerobic exercise including progression. Written material given at graduation.   Education: Resistance Exercise: - Group verbal and visual presentation on the components of exercise prescription. Introduces F.I.T.T principle from ACSM for exercise prescriptions  Reviews F.I.T.T. principles of resistance exercise including progression. Written material given at graduation.    Education: Exercise & Equipment Safety: - Individual verbal instruction and demonstration of equipment use and safety with use of the equipment. Flowsheet Row Cardiac Rehab from 02/24/2023 in [facility] Cardiac and Pulmonary Rehab  Date 02/24/23  Educator [facility]  Instruction Review Code 1- Verbalizes  Understanding       Education: Exercise Physiology & General Exercise Guidelines: - Group verbal and written instruction with models to review the exercise physiology of the cardiovascular system and associated critical values. Provides general exercise guidelines with specific guidelines to those with heart or lung disease.    Education: Flexibility, Balance, Mind/Body Relaxation: - Group verbal and visual presentation with interactive activity on the components of exercise prescription. Introduces F.I.T.T principle from ACSM for exercise prescriptions. Reviews F.I.T.T. principles of flexibility and balance exercise training including progression. Also discusses the mind body connection.  Reviews various relaxation techniques to help reduce and manage stress (i.e. Deep breathing, progressive muscle relaxation, and visualization). Balance handout provided to take home. Written material given at graduation.   Activity Barriers & Risk Stratification:  Activity Barriers & Cardiac Risk Stratification - 02/24/23 1725       Activity Barriers & Cardiac Risk Stratification   Activity Barriers Balance Concerns    Cardiac Risk Stratification High             6 Minute Walk:  6 Minute Walk     Row Name 02/24/23 1723         6 Minute Walk   Phase Initial     Distance 1200 feet     Walk Time 6 minutes     # of Rest Breaks 0     MPH 2.27     METS 2.3     RPE 11     Perceived Dyspnea  1     VO2 Peak 8.05     Symptoms Yes (comment)     Comments slight dizziness     Resting HR 61 bpm     Resting BP 122/70     Resting Oxygen Saturation  95 %     Exercise Oxygen Saturation  during 6 min walk 95 %     Max Ex. HR 89 bpm     Max Ex. BP 138/80     2 Minute Post BP 128/78              Oxygen Initial Assessment:   Oxygen Re-Evaluation:   Oxygen Discharge (Final Oxygen Re-Evaluation):   Initial Exercise Prescription:  Initial Exercise Prescription - 02/24/23 1700        Date of Initial Exercise RX and Referring Provider   Date 02/24/23    Referring Provider [name]      Oxygen   Maintain Oxygen Saturation 88% or higher      Recumbant Bike   Level 2    RPM 50    Watts 15    Minutes 15      NuStep   Level 2    SPM 80    Minutes 15    METs 2.3      REL-XR   Level 2  Speed 50    Minutes 15    METs 2.3      Track   Laps 20    Minutes 15    METs 2.09      Prescription Details   Frequency (times per week) 3    Duration Progress to 30 minutes of continuous aerobic without signs/symptoms of physical distress      Intensity   THRR 40-80% of Max Heartrate 94-128    Ratings of Perceived Exertion 11-13    Perceived Dyspnea 0-4      Progression   Progression Continue to progress workloads to maintain intensity without signs/symptoms of physical distress.      Resistance Training   Training Prescription Yes    Weight 3    Reps 10-15             Perform Capillary Blood Glucose checks as needed.  Exercise Prescription Changes:   Exercise Prescription Changes     Row Name 02/24/23 1700             Response to Exercise   Blood Pressure (Admit) 122/70       Blood Pressure (Exercise) 138/80       Blood Pressure (Exit) 128/78       Heart Rate (Admit) 61 bpm       Heart Rate (Exercise) 89 bpm       Heart Rate (Exit) 65 bpm       Oxygen Saturation (Admit) 95 %       Oxygen Saturation (Exercise) 95 %       Oxygen Saturation (Exit) 96 %       Rating of Perceived Exertion (Exercise) 11       Perceived Dyspnea (Exercise) 1       Symptoms slight dizziness       Comments 6 MWT results                Exercise Comments:   Exercise Goals and Review:   Exercise Goals     Row Name 02/24/23 1728             Exercise Goals   Increase Physical Activity Yes       Intervention Provide advice, education, support and counseling about physical activity/exercise needs.;Develop an individualized exercise  prescription for aerobic and resistive training based on initial evaluation findings, risk stratification, comorbidities and participant's personal goals.       Expected Outcomes Short Term: Attend rehab on a regular basis to increase amount of physical activity.;Long Term: Exercising regularly at least 3-5 days a week.       Increase Strength and Stamina Yes       Intervention Provide advice, education, support and counseling about physical activity/exercise needs.;Develop an individualized exercise prescription for aerobic and resistive training based on initial evaluation findings, risk stratification, comorbidities and participant's personal goals.       Expected Outcomes Short Term: Increase workloads from initial exercise prescription for resistance, speed, and METs.;Short Term: Perform resistance training exercises routinely during rehab and add in resistance training at home;Long Term: Improve cardiorespiratory fitness, muscular endurance and strength as measured by increased METs and functional capacity ( )       Able to understand and use rate of perceived exertion (RPE) scale Yes       Intervention Provide education and explanation on how to use RPE scale       Expected Outcomes Short Term: Able to use RPE daily in rehab to express subjective intensity  level;Long Term:  Able to use RPE to guide intensity level when exercising independently       Able to understand and use Dyspnea scale Yes       Intervention Provide education and explanation on how to use Dyspnea scale       Expected Outcomes Short Term: Able to use Dyspnea scale daily in rehab to express subjective sense of shortness of breath during exertion;Long Term: Able to use Dyspnea scale to guide intensity level when exercising independently       Knowledge and understanding of Target Heart Rate Range (THRR) Yes       Intervention Provide education and explanation of THRR including how the numbers were predicted and where they are  located for reference       Expected Outcomes Short Term: Able to state/look up THRR;Long Term: Able to use THRR to govern intensity when exercising independently;Short Term: Able to use daily as guideline for intensity in rehab       Able to check pulse independently Yes       Intervention Provide education and demonstration on how to check pulse in carotid and radial arteries.;Review the importance of being able to check your own pulse for safety during independent exercise       Expected Outcomes Short Term: Able to explain why pulse checking is important during independent exercise;Long Term: Able to check pulse independently and accurately       Understanding of Exercise Prescription Yes       Intervention Provide education, explanation, and written materials on patient's individual exercise prescription       Expected Outcomes Short Term: Able to explain program exercise prescription;Long Term: Able to explain home exercise prescription to exercise independently                Exercise Goals Re-Evaluation :   Discharge Exercise Prescription (Final Exercise Prescription Changes):  Exercise Prescription Changes - 02/24/23 1700       Response to Exercise   Blood Pressure (Admit) 122/70    Blood Pressure (Exercise) 138/80    Blood Pressure (Exit) 128/78    Heart Rate (Admit) 61 bpm    Heart Rate (Exercise) 89 bpm    Heart Rate (Exit) 65 bpm    Oxygen Saturation (Admit) 95 %    Oxygen Saturation (Exercise) 95 %    Oxygen Saturation (Exit) 96 %    Rating of Perceived Exertion (Exercise) 11    Perceived Dyspnea (Exercise) 1    Symptoms slight dizziness    Comments 6 MWT results             Nutrition:  Target Goals: Understanding of nutrition guidelines, daily intake of sodium 1500mg , cholesterol 200mg , calories 30% from fat and 7% or less from saturated fats, daily to have 5 or more servings of fruits and vegetables.  Education: All About Nutrition: -Group instruction  provided by verbal, written material, interactive activities, discussions, models, and posters to present general guidelines for heart healthy nutrition including fat, fiber, MyPlate, the role of sodium in heart healthy nutrition, utilization of the nutrition label, and utilization of this knowledge for meal planning. Follow up email sent as well. Written material given at graduation.   Biometrics:  Pre Biometrics - 02/24/23 1729       Pre Biometrics   Height 5' 10.5" (1.791 m)    Weight 210 lb 8 oz (95.5 kg)    Waist Circumference 40.5 inches    Hip Circumference 44.5 inches  Waist to Hip Ratio 0.91 %    BMI (Calculated) 29.77    Single Leg Stand 3.16 seconds              Nutrition Therapy Plan and Nutrition Goals:  Nutrition Therapy & Goals - 02/24/23 1730       Intervention Plan   Intervention Prescribe, educate and counsel regarding individualized specific dietary modifications aiming towards targeted core components such as weight, hypertension, lipid management, diabetes, heart failure and other comorbidities.    Expected Outcomes Short Term Goal: Understand basic principles of dietary content, such as calories, fat, sodium, cholesterol and nutrients.;Short Term Goal: A plan has been developed with personal nutrition goals set during dietitian appointment.;Long Term Goal: Adherence to prescribed nutrition plan.             Nutrition Assessments:  MEDIFICTS Score Key: ?70 Need to make dietary changes  40-70 Heart Healthy Diet ? 40 Therapeutic Level Cholesterol Diet   Picture Your Plate Scores: <95 Unhealthy dietary pattern with much room for improvement. 41-50 Dietary pattern unlikely to meet recommendations for good health and room for improvement. 51-60 More healthful dietary pattern, with some room for improvement.  >60 Healthy dietary pattern, although there may be some specific behaviors that could be improved.    Nutrition Goals  Re-Evaluation:   Nutrition Goals Discharge (Final Nutrition Goals Re-Evaluation):   Psychosocial: Target Goals: Acknowledge presence or absence of significant depression and/or stress, maximize coping skills, provide positive support system. Participant is able to verbalize types and ability to use techniques and skills needed for reducing stress and depression.   Education: Stress, Anxiety, and Depression - Group verbal and visual presentation to define topics covered.  Reviews how body is impacted by stress, anxiety, and depression.  Also discusses healthy ways to reduce stress and to treat/manage anxiety and depression.  Written material given at graduation.   Education: Sleep Hygiene -Provides group verbal and written instruction about how sleep can affect your health.  Define sleep hygiene, discuss sleep cycles and impact of sleep habits. Review good sleep hygiene tips.    Initial Review & Psychosocial Screening:  Initial Psych Review & Screening - 02/17/23 1607       Initial Review   Current issues with Current Depression   [facility]     Family Dynamics   Good Support System? Yes   pt's wife     Screening Interventions   Expected Outcomes Short Term goal: Utilizing psychosocial counselor, staff and physician to assist with identification of specific Stressors or current issues interfering with healing process. Setting desired goal for each stressor or current issue identified.;Long Term Goal: Stressors or current issues are controlled or eliminated.;Short Term goal: Identification and review with participant of any Quality of Life or Depression concerns found by scoring the questionnaire.;Long Term goal: The participant improves quality of Life and PHQ9 Scores as seen by post scores and/or verbalization of changes             Quality of Life Scores:   Scores of 19 and below usually indicate a poorer quality of life in these areas.  A difference of   2-3 points is a clinically meaningful difference.  A difference of 2-3 points in the total score of the Quality of Life Index has been associated with significant improvement in overall quality of life, self-image, physical symptoms, and general health in studies assessing change in quality of life.  PHQ-9: Review Flowsheet  02/24/2023  Depression screen PHQ 2/9  Decreased Interest 3  Down, Depressed, Hopeless 3  PHQ - 2 Score 6  Altered sleeping 0  Tired, decreased energy 3  Change in appetite 2  Feeling bad or failure about yourself  3  Trouble concentrating 2  Moving slowly or fidgety/restless 3  Suicidal thoughts 0  PHQ-9 Score 19  Difficult doing work/chores Not difficult at all   Interpretation of Total Score  Total Score Depression Severity:  1-4 = Minimal depression, 5-9 = Mild depression, 10-14 = Moderate depression, 15-19 = Moderately severe depression, 20-27 = Severe depression   Psychosocial Evaluation and Intervention:  Psychosocial Evaluation - 02/17/23 1613       Psychosocial Evaluation & Interventions   Comments Glennon is coming to cardiac rehab post stent placement on 01/16/23. He has no barriers for attending the program. Spoke with Eban and his wife, Corrie Dandy, who appears to show great support. Cylus's medical history also includes: aortic root dilatation (4.5cm), CVA, atrial fibrillation, h/o pulmonary embolism, HTN, HLD and valve insufficiency. Terald and his wife are aware that we will fax blood pressure parameter form to referring provider and both voice understanding that we will need to receive signed form prior to starting exercise. Zia has weight loss goals, as well as would like to build strength back while in rehab. 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 Tlaloc is hopeful to be able to get back to the gym after he completes rehab. Shamarion does report some balance concern after his stroke, but does not use any assistive devices at this  time. 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 Dia does have some concern with depression, and [facility]. Wilver is looking forward to starting rehab and has no further questions at this time.    Expected Outcomes Short: Attend cardiac rehab for education and exercise. Long: Develop and maintain positive self care habits.    Continue Psychosocial Services  Follow up required by staff             Psychosocial Re-Evaluation:   Psychosocial Discharge (Final Psychosocial Re-Evaluation):   Vocational Rehabilitation: Provide vocational rehab assistance to qualifying candidates.   Vocational Rehab Evaluation & Intervention:  Vocational Rehab - 02/17/23 1607       Initial Vocational Rehab Evaluation & Intervention   Assessment shows need for Vocational Rehabilitation No             Education: Education Goals: Education classes will be provided on a variety of topics geared toward better understanding of heart health and risk factor modification. Participant will state understanding/return demonstration of topics presented as noted by education test scores.  Learning Barriers/Preferences:  Learning Barriers/Preferences - 02/17/23 1553       Learning Barriers/Preferences   Learning Barriers None    Learning Preferences None             General Cardiac Education Topics:  AED/CPR: - Group verbal and written instruction with the use of models to demonstrate the basic use of the AED with the basic ABC's of resuscitation.   Anatomy and Cardiac Procedures: - Group verbal and visual presentation and models provide information about basic cardiac anatomy and function. Reviews the testing methods done to diagnose heart disease and the outcomes of the test results. Describes the treatment choices: Medical Management, Angioplasty, or Coronary Bypass Surgery for treating various heart conditions including Myocardial Infarction, Angina,  Valve Disease, and Cardiac Arrhythmias.  Written material given at graduation.   Medication Safety: - Group verbal and visual instruction to review commonly prescribed medications for heart and lung disease. Reviews the medication, class of the drug, and side effects. Includes the steps to properly store meds and maintain the prescription regimen.  Written material given at graduation.   Intimacy: - Group verbal instruction through game format to discuss how heart and lung disease can affect sexual intimacy. Written material given at graduation..   Know Your Numbers and Heart Failure: - Group verbal and visual instruction to discuss disease risk factors for cardiac and pulmonary disease and treatment options.  Reviews associated critical values for Overweight/Obesity, Hypertension, Cholesterol, and Diabetes.  Discusses basics of heart failure: signs/symptoms and treatments.  Introduces Heart Failure Zone chart for action plan for heart failure.  Written material given at graduation.   Infection Prevention: - Provides verbal and written material to individual with discussion of infection control including proper hand washing and proper equipment cleaning during exercise session. Flowsheet Row Cardiac Rehab from 02/24/2023 in [facility] Cardiac and Pulmonary Rehab  Date 02/24/23  Educator [facility]  Instruction Review Code 1- Verbalizes Understanding       Falls Prevention: - Provides verbal and written material to individual with discussion of falls prevention and safety. Flowsheet Row Cardiac Rehab from 02/24/2023 in [facility] Cardiac and Pulmonary Rehab  Date 02/24/23  Educator [facility]  Instruction Review Code 1- Verbalizes Understanding       Other: -Provides group and verbal instruction on various topics (see comments)   Knowledge Questionnaire Score:   Core Components/Risk Factors/Patient Goals at Admission:  Personal Goals and Risk Factors at Admission - 02/24/23 1729       Core  Components/Risk Factors/Patient Goals on Admission    Weight Management Yes    Intervention Weight Management: Develop a combined nutrition and exercise program designed to reach desired caloric intake, while maintaining appropriate intake of nutrient and fiber, sodium and fats, and appropriate energy expenditure required for the weight goal.;Weight Management: Provide education and appropriate resources to help participant work on and attain dietary goals.;Weight Management/Obesity: Establish reasonable short term and long term weight goals.    Admit Weight 220 lb (99.8 kg)    Goal Weight: Short Term 215 lb (97.5 kg)    Goal Weight: Long Term 200 lb (90.7 kg)    Expected Outcomes Short Term: Continue to assess and modify interventions until short term weight is achieved;Long Term: Adherence to nutrition and physical activity/exercise program aimed toward attainment of established weight goal;Weight Maintenance: Understanding of the daily nutrition guidelines, which includes 25-35% calories from fat, 7% or less cal from saturated fats, less than 200mg  cholesterol, less than 1.5gm of sodium, & 5 or more servings of fruits and vegetables daily;Weight Loss: Understanding of general recommendations for a balanced deficit meal plan, which promotes 1-2 lb weight loss per week and includes a negative energy balance of [phone] kcal/d;Understanding recommendations for meals to include 15-35% energy as protein, 25-35% energy from fat, 35-60% energy from carbohydrates, less than 200mg  of dietary cholesterol, 20-35 gm of total fiber daily;Understanding of distribution of calorie intake throughout the day with the consumption of 4-5 meals/snacks    Hypertension Yes    Intervention Provide education on lifestyle modifcations including regular physical activity/exercise, weight management, moderate sodium restriction and increased consumption of fresh fruit, vegetables, and low fat dairy, alcohol moderation, and smoking  cessation.;Monitor prescription use compliance.    Expected Outcomes Short Term:  Continued assessment and intervention until BP is < 140/52mm HG in hypertensive participants. < 130/61mm HG in hypertensive participants with diabetes, heart failure or chronic kidney disease.;Long Term: Maintenance of blood pressure at goal levels.    Lipids Yes    Intervention Provide education and support for participant on nutrition & aerobic/resistive exercise along with prescribed medications to achieve LDL 70mg , HDL >40mg .    Expected Outcomes Short Term: Participant states understanding of desired cholesterol values and is compliant with medications prescribed. Participant is following exercise prescription and nutrition guidelines.;Long Term: Cholesterol controlled with medications as prescribed, with individualized exercise RX and with personalized nutrition plan. Value goals: LDL < 70mg , HDL > 40 mg.             Education:Diabetes - Individual verbal and written instruction to review signs/symptoms of diabetes, desired ranges of glucose level fasting, after meals and with exercise. Acknowledge that pre and post exercise glucose checks will be done for 3 sessions at entry of program.   Core Components/Risk Factors/Patient Goals Review:    Core Components/Risk Factors/Patient Goals at Discharge (Final Review):    ITP Comments:  ITP Comments     Row Name 02/17/23 1602 02/24/23 1715         ITP Comments Initial phone call completed. Dx can be found in media scan dated 02/24/23. EP orientation scheduled for 6/24 at 3:00 pm. Completed and gym orientation. Initial ITP created and sent for review to Dr. Bethann Punches, Medical Director.               Comments: initial ITP

## 2023-02-24 NOTE — Patient Instructions (Signed)
Patient Instructions  Patient Details  Name: Geoffrey Wells MRN: [id number] Date of Birth: 1946-08-05 Referring Provider:  [name], FNP  Below are your personal goals for exercise, nutrition, and risk factors. Our goal is to help you stay on track towards obtaining and maintaining these goals. We will be discussing your progress on these goals with you throughout the program.  Initial Exercise Prescription:  Initial Exercise Prescription - 02/24/23 1700       Date of Initial Exercise RX and Referring Provider   Date 02/24/23    Referring Provider [name]      Oxygen   Maintain Oxygen Saturation 88% or higher      Recumbant Bike   Level 2    RPM 50    Watts 15    Minutes 15      NuStep   Level 2    SPM 80    Minutes 15    METs 2.3      REL-XR   Level 2    Speed 50    Minutes 15    METs 2.3      Track   Laps 20    Minutes 15    METs 2.09      Prescription Details   Frequency (times per week) 3    Duration Progress to 30 minutes of continuous aerobic without signs/symptoms of physical distress      Intensity   THRR 40-80% of Max Heartrate 94-128    Ratings of Perceived Exertion 11-13    Perceived Dyspnea 0-4      Progression   Progression Continue to progress workloads to maintain intensity without signs/symptoms of physical distress.      Resistance Training   Training Prescription Yes    Weight 3    Reps 10-15             Exercise Goals: Frequency: Be able to perform aerobic exercise two to three times per week in program working toward 2-5 days per week of home exercise.  Intensity: Work with a perceived exertion of 11 (fairly light) - 15 (hard) while following your exercise prescription.  We will make changes to your prescription with you as you progress through the program.   Duration: Be able to do 30 to 45 minutes of continuous aerobic exercise in addition to a 5 minute warm-up and a 5 minute cool-down routine.   Nutrition  Goals: Your personal nutrition goals will be established when you do your nutrition analysis with the dietician.  The following are general nutrition guidelines to follow: Cholesterol < 200mg /day Sodium < 1500mg /day Fiber: Men over 50 yrs - 30 grams per day  Personal Goals:  Personal Goals and Risk Factors at Admission - 02/24/23 1729       Core Components/Risk Factors/Patient Goals on Admission    Weight Management Yes    Intervention Weight Management: Develop a combined nutrition and exercise program designed to reach desired caloric intake, while maintaining appropriate intake of nutrient and fiber, sodium and fats, and appropriate energy expenditure required for the weight goal.;Weight Management: Provide education and appropriate resources to help participant work on and attain dietary goals.;Weight Management/Obesity: Establish reasonable short term and long term weight goals.    Admit Weight 220 lb (99.8 kg)    Goal Weight: Short Term 215 lb (97.5 kg)    Goal Weight: Long Term 200 lb (90.7 kg)    Expected Outcomes Short Term: Continue to assess and modify interventions until short term weight  is achieved;Long Term: Adherence to nutrition and physical activity/exercise program aimed toward attainment of established weight goal;Weight Maintenance: Understanding of the daily nutrition guidelines, which includes 25-35% calories from fat, 7% or less cal from saturated fats, less than 200mg  cholesterol, less than 1.5gm of sodium, & 5 or more servings of fruits and vegetables daily;Weight Loss: Understanding of general recommendations for a balanced deficit meal plan, which promotes 1-2 lb weight loss per week and includes a negative energy balance of [id number] kcal/d;Understanding recommendations for meals to include 15-35% energy as protein, 25-35% energy from fat, 35-60% energy from carbohydrates, less than 200mg  of dietary cholesterol, 20-35 gm of total fiber daily;Understanding of distribution  of calorie intake throughout the day with the consumption of 4-5 meals/snacks    Hypertension Yes    Intervention Provide education on lifestyle modifcations including regular physical activity/exercise, weight management, moderate sodium restriction and increased consumption of fresh fruit, vegetables, and low fat dairy, alcohol moderation, and smoking cessation.;Monitor prescription use compliance.    Expected Outcomes Short Term: Continued assessment and intervention until BP is < 140/27mm HG in hypertensive participants. < 130/58mm HG in hypertensive participants with diabetes, heart failure or chronic kidney disease.;Long Term: Maintenance of blood pressure at goal levels.    Lipids Yes    Intervention Provide education and support for participant on nutrition & aerobic/resistive exercise along with prescribed medications to achieve LDL 70mg , HDL >40mg .    Expected Outcomes Short Term: Participant states understanding of desired cholesterol values and is compliant with medications prescribed. Participant is following exercise prescription and nutrition guidelines.;Long Term: Cholesterol controlled with medications as prescribed, with individualized exercise RX and with personalized nutrition plan. Value goals: LDL < 70mg , HDL > 40 mg.             Tobacco Use Initial Evaluation: Social History   Tobacco Use  Smoking Status Former   Packs/day: 0.50   Years: 25.00   Additional pack years: 0.00   Total pack years: 12.50   Types: Cigarettes  Smokeless Tobacco Never    Exercise Goals and Review:  Exercise Goals     Row Name 02/24/23 1728             Exercise Goals   Increase Physical Activity Yes       Intervention Provide advice, education, support and counseling about physical activity/exercise needs.;Develop an individualized exercise prescription for aerobic and resistive training based on initial evaluation findings, risk stratification, comorbidities and participant's  personal goals.       Expected Outcomes Short Term: Attend rehab on a regular basis to increase amount of physical activity.;Long Term: Exercising regularly at least 3-5 days a week.       Increase Strength and Stamina Yes       Intervention Provide advice, education, support and counseling about physical activity/exercise needs.;Develop an individualized exercise prescription for aerobic and resistive training based on initial evaluation findings, risk stratification, comorbidities and participant's personal goals.       Expected Outcomes Short Term: Increase workloads from initial exercise prescription for resistance, speed, and METs.;Short Term: Perform resistance training exercises routinely during rehab and add in resistance training at home;Long Term: Improve cardiorespiratory fitness, muscular endurance and strength as measured by increased METs and functional capacity ( )       Able to understand and use rate of perceived exertion (RPE) scale Yes       Intervention Provide education and explanation on how to use RPE scale  Expected Outcomes Short Term: Able to use RPE daily in rehab to express subjective intensity level;Long Term:  Able to use RPE to guide intensity level when exercising independently       Able to understand and use Dyspnea scale Yes       Intervention Provide education and explanation on how to use Dyspnea scale       Expected Outcomes Short Term: Able to use Dyspnea scale daily in rehab to express subjective sense of shortness of breath during exertion;Long Term: Able to use Dyspnea scale to guide intensity level when exercising independently       Knowledge and understanding of Target Heart Rate Range (THRR) Yes       Intervention Provide education and explanation of THRR including how the numbers were predicted and where they are located for reference       Expected Outcomes Short Term: Able to state/look up THRR;Long Term: Able to use THRR to govern intensity when  exercising independently;Short Term: Able to use daily as guideline for intensity in rehab       Able to check pulse independently Yes       Intervention Provide education and demonstration on how to check pulse in carotid and radial arteries.;Review the importance of being able to check your own pulse for safety during independent exercise       Expected Outcomes Short Term: Able to explain why pulse checking is important during independent exercise;Long Term: Able to check pulse independently and accurately       Understanding of Exercise Prescription Yes       Intervention Provide education, explanation, and written materials on patient's individual exercise prescription       Expected Outcomes Short Term: Able to explain program exercise prescription;Long Term: Able to explain home exercise prescription to exercise independently                Copy of goals given to participant.

## 2023-03-03 ENCOUNTER — Telehealth: Payer: Self-pay | Admitting: *Deleted

## 2023-03-03 ENCOUNTER — Encounter: Payer: Self-pay | Admitting: *Deleted

## 2023-03-03 DIAGNOSIS — Z955 Presence of coronary angioplasty implant and graft: Secondary | ICD-10-CM

## 2023-03-03 NOTE — Telephone Encounter (Signed)
Patient's wife called to inform us that [facility] not [facility] restriction for cardiac rehab until Geoffrey Wells has an echocardiogram. He has that scheduled for July 12, but the patient is then going on vacation the week after so his new start date will be July 22, providing he brings his blood pressure restriction form to his first day of class.  

## 2023-03-03 NOTE — Progress Notes (Signed)
Patient's wife called to inform us that the [place] will not provide BP restriction for cardiac rehab until Geoffrey Wells has an echocardiogram. He has that scheduled for July 12, but the patient is then going on vacation the week after so his new start date will be July 22, providing he brings his blood pressure restriction form to his first day of class.

## 2023-03-11 ENCOUNTER — Encounter: Payer: Self-pay | Admitting: *Deleted

## 2023-03-11 DIAGNOSIS — Z955 Presence of coronary angioplasty implant and graft: Secondary | ICD-10-CM

## 2023-03-11 NOTE — Progress Notes (Signed)
Cardiac Individual Treatment Plan  Patient Details  Name: Geoffrey Wells MRN: [id number] Date of Birth: 23-Jul-1946 Referring Provider:   Flowsheet Row Cardiac Rehab from 02/24/2023 in [facility] Cardiac and Pulmonary Rehab  Referring Provider [name]       Initial Encounter Date:  Flowsheet Row Cardiac Rehab from 02/24/2023 in [facility] Cardiac and Pulmonary Rehab  Date 02/24/23       Visit Diagnosis: Status post coronary artery stent placement  Patient's Home Medications on Admission:  Current Outpatient Medications:    acetaminophen (TYLENOL) 325 MG tablet, Take 1-2 tablets (325-650 mg total) by mouth every 4 (four) hours as needed for mild pain., Disp: , Rfl:    apixaban (ELIQUIS) 5 MG TABS tablet, Take 5 mg by mouth 2 (two) times daily., Disp: , Rfl:    b complex vitamins capsule, Take 1 capsule by mouth daily., Disp: , Rfl:    clopidogrel (PLAVIX) 75 MG tablet, Take 75 mg by mouth daily., Disp: , Rfl:    Coenzyme Q10 (CO Q10) 100 MG CAPS, Take 1 capsule by mouth daily., Disp: , Rfl:    Magnesium 500 MG TABS, Take 1 tablet (500 mg total) by mouth daily., Disp: 30 tablet, Rfl: 0   metoprolol succinate (TOPROL-XL) 50 MG 24 hr tablet, Take 25 mg by mouth daily., Disp: , Rfl:    Nutritional Supplements (JUICE PLUS FIBRE PO), Take 3 capsules by mouth in the morning and at bedtime., Disp: , Rfl:    omega-3 acid ethyl esters (LOVAZA) 1 g capsule, Take by mouth 2 (two) times daily., Disp: , Rfl:    rosuvastatin (CRESTOR) 20 MG tablet, Take 20 mg by mouth daily., Disp: , Rfl:    Vitamin D-Vitamin K (VITAMIN D2 + K1 PO), Take by mouth., Disp: , Rfl:   Past Medical History: Past Medical History:  Diagnosis Date   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    fatigue and "foggy brain"   Pneumonia due to COVID-19 virus 01/10/2020   hospitalization X 2   Pulmonary emboli (HCC)    post Covid   Throat cancer (HCC)    Radiotactic surgery    Tobacco Use: Social History    Tobacco Use  Smoking Status Former   Packs/day: 0.50   Years: 25.00   Additional pack years: 0.00   Total pack years: 12.50   Types: Cigarettes  Smokeless Tobacco Never    Labs: Review Flowsheet       Latest Ref Rng & Units 01/19/2020 03/30/2021  Labs for ITP Cardiac and Pulmonary Rehab  Cholestrol 0 - 200 mg/dL - 130   LDL (calc) 0 - 99 mg/dL - 865   HDL-C >78 mg/dL - 67   Trlycerides <469 mg/dL - 89   Hemoglobin G2X 4.8 - 5.6 % - 5.8   Bicarbonate 20.0 - 28.0 mmol/L 24.1  -  Acid-base deficit 0.0 - 2.0 mmol/L 0.3  -  O2 Saturation % 69.7  -     Exercise Target Goals: Exercise Program Goal: Individual exercise prescription set using results from initial 6 min walk test and THRR while considering  patient's activity barriers and safety.   Exercise Prescription Goal: Initial exercise prescription builds to 30-45 minutes a day of aerobic activity, 2-3 days per week.  Home exercise guidelines will be given to patient during program as part of exercise prescription that the participant will acknowledge.   Education: Aerobic Exercise: - Group verbal and visual presentation on the components of exercise  prescription. Introduces F.I.T.T principle from ACSM for exercise prescriptions.  Reviews F.I.T.T. principles of aerobic exercise including progression. Written material given at graduation.   Education: Resistance Exercise: - Group verbal and visual presentation on the components of exercise prescription. Introduces F.I.T.T principle from ACSM for exercise prescriptions  Reviews F.I.T.T. principles of resistance exercise including progression. Written material given at graduation.    Education: Exercise & Equipment Safety: - Individual verbal instruction and demonstration of equipment use and safety with use of the equipment. Flowsheet Row Cardiac Rehab from 02/24/2023 in [facility] Cardiac and Pulmonary Rehab  Date 02/24/23  Educator [facility]  Instruction Review Code 1- Verbalizes  Understanding       Education: Exercise Physiology & General Exercise Guidelines: - Group verbal and written instruction with models to review the exercise physiology of the cardiovascular system and associated critical values. Provides general exercise guidelines with specific guidelines to those with heart or lung disease.    Education: Flexibility, Balance, Mind/Body Relaxation: - Group verbal and visual presentation with interactive activity on the components of exercise prescription. Introduces F.I.T.T principle from ACSM for exercise prescriptions. Reviews F.I.T.T. principles of flexibility and balance exercise training including progression. Also discusses the mind body connection.  Reviews various relaxation techniques to help reduce and manage stress (i.e. Deep breathing, progressive muscle relaxation, and visualization). Balance handout provided to take home. Written material given at graduation.   Activity Barriers & Risk Stratification:  Activity Barriers & Cardiac Risk Stratification - 02/24/23 1725       Activity Barriers & Cardiac Risk Stratification   Activity Barriers Balance Concerns    Cardiac Risk Stratification High             6 Minute Walk:  6 Minute Walk     Row Name 02/24/23 1723         6 Minute Walk   Phase Initial     Distance 1200 feet     Walk Time 6 minutes     # of Rest Breaks 0     MPH 2.27     METS 2.3     RPE 11     Perceived Dyspnea  1     VO2 Peak 8.05     Symptoms Yes (comment)     Comments slight dizziness     Resting HR 61 bpm     Resting BP 122/70     Resting Oxygen Saturation  95 %     Exercise Oxygen Saturation  during 6 min walk 95 %     Max Ex. HR 89 bpm     Max Ex. BP 138/80     2 Minute Post BP 128/78              Oxygen Initial Assessment:   Oxygen Re-Evaluation:   Oxygen Discharge (Final Oxygen Re-Evaluation):   Initial Exercise Prescription:  Initial Exercise Prescription - 02/24/23 1700        Date of Initial Exercise RX and Referring Provider   Date 02/24/23    Referring Provider [name]      Oxygen   Maintain Oxygen Saturation 88% or higher      Recumbant Bike   Level 2    RPM 50    Watts 15    Minutes 15      NuStep   Level 2    SPM 80    Minutes 15    METs 2.3      REL-XR   Level 2  Speed 50    Minutes 15    METs 2.3      Track   Laps 20    Minutes 15    METs 2.09      Prescription Details   Frequency (times per week) 3    Duration Progress to 30 minutes of continuous aerobic without signs/symptoms of physical distress      Intensity   THRR 40-80% of Max Heartrate 94-128    Ratings of Perceived Exertion 11-13    Perceived Dyspnea 0-4      Progression   Progression Continue to progress workloads to maintain intensity without signs/symptoms of physical distress.      Resistance Training   Training Prescription Yes    Weight 3    Reps 10-15             Perform Capillary Blood Glucose checks as needed.  Exercise Prescription Changes:   Exercise Prescription Changes     Row Name 02/24/23 1700             Response to Exercise   Blood Pressure (Admit) 122/70       Blood Pressure (Exercise) 138/80       Blood Pressure (Exit) 128/78       Heart Rate (Admit) 61 bpm       Heart Rate (Exercise) 89 bpm       Heart Rate (Exit) 65 bpm       Oxygen Saturation (Admit) 95 %       Oxygen Saturation (Exercise) 95 %       Oxygen Saturation (Exit) 96 %       Rating of Perceived Exertion (Exercise) 11       Perceived Dyspnea (Exercise) 1       Symptoms slight dizziness       Comments 6 MWT results                Exercise Comments:   Exercise Goals and Review:   Exercise Goals     Row Name 02/24/23 1728             Exercise Goals   Increase Physical Activity Yes       Intervention Provide advice, education, support and counseling about physical activity/exercise needs.;Develop an individualized exercise  prescription for aerobic and resistive training based on initial evaluation findings, risk stratification, comorbidities and participant's personal goals.       Expected Outcomes Short Term: Attend rehab on a regular basis to increase amount of physical activity.;Long Term: Exercising regularly at least 3-5 days a week.       Increase Strength and Stamina Yes       Intervention Provide advice, education, support and counseling about physical activity/exercise needs.;Develop an individualized exercise prescription for aerobic and resistive training based on initial evaluation findings, risk stratification, comorbidities and participant's personal goals.       Expected Outcomes Short Term: Increase workloads from initial exercise prescription for resistance, speed, and METs.;Short Term: Perform resistance training exercises routinely during rehab and add in resistance training at home;Long Term: Improve cardiorespiratory fitness, muscular endurance and strength as measured by increased METs and functional capacity ( )       Able to understand and use rate of perceived exertion (RPE) scale Yes       Intervention Provide education and explanation on how to use RPE scale       Expected Outcomes Short Term: Able to use RPE daily in rehab to express subjective intensity  level;Long Term:  Able to use RPE to guide intensity level when exercising independently       Able to understand and use Dyspnea scale Yes       Intervention Provide education and explanation on how to use Dyspnea scale       Expected Outcomes Short Term: Able to use Dyspnea scale daily in rehab to express subjective sense of shortness of breath during exertion;Long Term: Able to use Dyspnea scale to guide intensity level when exercising independently       Knowledge and understanding of Target Heart Rate Range (THRR) Yes       Intervention Provide education and explanation of THRR including how the numbers were predicted and where they are  located for reference       Expected Outcomes Short Term: Able to state/look up THRR;Long Term: Able to use THRR to govern intensity when exercising independently;Short Term: Able to use daily as guideline for intensity in rehab       Able to check pulse independently Yes       Intervention Provide education and demonstration on how to check pulse in carotid and radial arteries.;Review the importance of being able to check your own pulse for safety during independent exercise       Expected Outcomes Short Term: Able to explain why pulse checking is important during independent exercise;Long Term: Able to check pulse independently and accurately       Understanding of Exercise Prescription Yes       Intervention Provide education, explanation, and written materials on patient's individual exercise prescription       Expected Outcomes Short Term: Able to explain program exercise prescription;Long Term: Able to explain home exercise prescription to exercise independently                Exercise Goals Re-Evaluation :   Discharge Exercise Prescription (Final Exercise Prescription Changes):  Exercise Prescription Changes - 02/24/23 1700       Response to Exercise   Blood Pressure (Admit) 122/70    Blood Pressure (Exercise) 138/80    Blood Pressure (Exit) 128/78    Heart Rate (Admit) 61 bpm    Heart Rate (Exercise) 89 bpm    Heart Rate (Exit) 65 bpm    Oxygen Saturation (Admit) 95 %    Oxygen Saturation (Exercise) 95 %    Oxygen Saturation (Exit) 96 %    Rating of Perceived Exertion (Exercise) 11    Perceived Dyspnea (Exercise) 1    Symptoms slight dizziness    Comments 6 MWT results             Nutrition:  Target Goals: Understanding of nutrition guidelines, daily intake of sodium 1500mg , cholesterol 200mg , calories 30% from fat and 7% or less from saturated fats, daily to have 5 or more servings of fruits and vegetables.  Education: All About Nutrition: -Group instruction  provided by verbal, written material, interactive activities, discussions, models, and posters to present general guidelines for heart healthy nutrition including fat, fiber, MyPlate, the role of sodium in heart healthy nutrition, utilization of the nutrition label, and utilization of this knowledge for meal planning. Follow up email sent as well. Written material given at graduation.   Biometrics:  Pre Biometrics - 02/24/23 1729       Pre Biometrics   Height 5' 10.5" (1.791 m)    Weight 210 lb 8 oz (95.5 kg)    Waist Circumference 40.5 inches    Hip Circumference 44.5 inches  Waist to Hip Ratio 0.91 %    BMI (Calculated) 29.77    Single Leg Stand 3.16 seconds              Nutrition Therapy Plan and Nutrition Goals:  Nutrition Therapy & Goals - 02/24/23 1730       Intervention Plan   Intervention Prescribe, educate and counsel regarding individualized specific dietary modifications aiming towards targeted core components such as weight, hypertension, lipid management, diabetes, heart failure and other comorbidities.    Expected Outcomes Short Term Goal: Understand basic principles of dietary content, such as calories, fat, sodium, cholesterol and nutrients.;Short Term Goal: A plan has been developed with personal nutrition goals set during dietitian appointment.;Long Term Goal: Adherence to prescribed nutrition plan.             Nutrition Assessments:  MEDIFICTS Score Key: ?70 Need to make dietary changes  40-70 Heart Healthy Diet ? 40 Therapeutic Level Cholesterol Diet   Picture Your Plate Scores: <16 Unhealthy dietary pattern with much room for improvement. 41-50 Dietary pattern unlikely to meet recommendations for good health and room for improvement. 51-60 More healthful dietary pattern, with some room for improvement.  >60 Healthy dietary pattern, although there may be some specific behaviors that could be improved.    Nutrition Goals  Re-Evaluation:   Nutrition Goals Discharge (Final Nutrition Goals Re-Evaluation):   Psychosocial: Target Goals: Acknowledge presence or absence of significant depression and/or stress, maximize coping skills, provide positive support system. Participant is able to verbalize types and ability to use techniques and skills needed for reducing stress and depression.   Education: Stress, Anxiety, and Depression - Group verbal and visual presentation to define topics covered.  Reviews how body is impacted by stress, anxiety, and depression.  Also discusses healthy ways to reduce stress and to treat/manage anxiety and depression.  Written material given at graduation.   Education: Sleep Hygiene -Provides group verbal and written instruction about how sleep can affect your health.  Define sleep hygiene, discuss sleep cycles and impact of sleep habits. Review good sleep hygiene tips.    Initial Review & Psychosocial Screening:  Initial Psych Review & Screening - 02/17/23 1607       Initial Review   Current issues with Current Depression   [facility]     Family Dynamics   Good Support System? Yes   pt's wife     Screening Interventions   Expected Outcomes Short Term goal: Utilizing psychosocial counselor, staff and physician to assist with identification of specific Stressors or current issues interfering with healing process. Setting desired goal for each stressor or current issue identified.;Long Term Goal: Stressors or current issues are controlled or eliminated.;Short Term goal: Identification and review with participant of any Quality of Life or Depression concerns found by scoring the questionnaire.;Long Term goal: The participant improves quality of Life and PHQ9 Scores as seen by post scores and/or verbalization of changes             Quality of Life Scores:   Quality of Life - 02/24/23 1740       Quality of Life   Select Quality of Life      Quality of  Life Scores   Health/Function Pre 19.63 %    Socioeconomic Pre 24.42 %    Psych/Spiritual Pre 26.79 %    Family Pre 23.4 %    GLOBAL Pre 22.59 %  Scores of 19 and below usually indicate a poorer quality of life in these areas.  A difference of  2-3 points is a clinically meaningful difference.  A difference of 2-3 points in the total score of the Quality of Life Index has been associated with significant improvement in overall quality of life, self-image, physical symptoms, and general health in studies assessing change in quality of life.  PHQ-9: Review Flowsheet       02/24/2023  Depression screen PHQ 2/9  Decreased Interest 3  Down, Depressed, Hopeless 3  PHQ - 2 Score 6  Altered sleeping 0  Tired, decreased energy 3  Change in appetite 2  Feeling bad or failure about yourself  3  Trouble concentrating 2  Moving slowly or fidgety/restless 3  Suicidal thoughts 0  PHQ-9 Score 19  Difficult doing work/chores Not difficult at all   Interpretation of Total Score  Total Score Depression Severity:  1-4 = Minimal depression, 5-9 = Mild depression, 10-14 = Moderate depression, 15-19 = Moderately severe depression, 20-27 = Severe depression   Psychosocial Evaluation and Intervention:  Psychosocial Evaluation - 02/17/23 1613       Psychosocial Evaluation & Interventions   Comments Geoffrey Wells is coming to cardiac rehab post stent placement on 01/16/23. He has no barriers for attending the program. Spoke with Geoffrey Wells and his wife, Geoffrey Dandy, who appears to show great support. Geoffrey Wells's medical history also includes: aortic root dilatation (4.5cm), CVA, atrial fibrillation, h/o pulmonary embolism, HTN, HLD and valve insufficiency. Bliss and his wife are aware that we will fax blood pressure parameter form to referring provider and both voice understanding that we will need to receive signed form prior to starting exercise. Geoffrey Wells has weight loss goals, as well as would like to build strength back while  in rehab. 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 Geoffrey Wells is hopeful to be able to get back to the gym after he completes rehab. Geoffrey Wells does report some balance concern after his stroke, but does not use any assistive devices at this time. 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 Geoffrey Wells does have some concern with depression, and [facility]. Geoffrey Wells is looking forward to starting rehab and has no further questions at this time.    Expected Outcomes Short: Attend cardiac rehab for education and exercise. Long: Develop and maintain positive self care habits.    Continue Psychosocial Services  Follow up required by staff             Psychosocial Re-Evaluation:   Psychosocial Discharge (Final Psychosocial Re-Evaluation):   Vocational Rehabilitation: Provide vocational rehab assistance to qualifying candidates.   Vocational Rehab Evaluation & Intervention:  Vocational Rehab - 02/17/23 1607       Initial Vocational Rehab Evaluation & Intervention   Assessment shows need for Vocational Rehabilitation No             Education: Education Goals: Education classes will be provided on a variety of topics geared toward better understanding of heart health and risk factor modification. Participant will state understanding/return demonstration of topics presented as noted by education test scores.  Learning Barriers/Preferences:  Learning Barriers/Preferences - 02/17/23 1553       Learning Barriers/Preferences   Learning Barriers None    Learning Preferences None             General Cardiac Education Topics:  AED/CPR: - Group verbal and written  instruction with the use of models to demonstrate the basic use of the AED with the basic ABC's of resuscitation.   Anatomy and Cardiac Procedures: - Group verbal and visual presentation and models provide information about basic cardiac anatomy and function. Reviews  the testing methods done to diagnose heart disease and the outcomes of the test results. Describes the treatment choices: Medical Management, Angioplasty, or Coronary Bypass Surgery for treating various heart conditions including Myocardial Infarction, Angina, Valve Disease, and Cardiac Arrhythmias.  Written material given at graduation.   Medication Safety: - Group verbal and visual instruction to review commonly prescribed medications for heart and lung disease. Reviews the medication, class of the drug, and side effects. Includes the steps to properly store meds and maintain the prescription regimen.  Written material given at graduation.   Intimacy: - Group verbal instruction through game format to discuss how heart and lung disease can affect sexual intimacy. Written material given at graduation..   Know Your Numbers and Heart Failure: - Group verbal and visual instruction to discuss disease risk factors for cardiac and pulmonary disease and treatment options.  Reviews associated critical values for Overweight/Obesity, Hypertension, Cholesterol, and Diabetes.  Discusses basics of heart failure: signs/symptoms and treatments.  Introduces Heart Failure Zone chart for action plan for heart failure.  Written material given at graduation.   Infection Prevention: - Provides verbal and written material to individual with discussion of infection control including proper hand washing and proper equipment cleaning during exercise session. Flowsheet Row Cardiac Rehab from 02/24/2023 in [facility] Cardiac and Pulmonary Rehab  Date 02/24/23  Educator [facility]  Instruction Review Code 1- Verbalizes Understanding       Falls Prevention: - Provides verbal and written material to individual with discussion of falls prevention and safety. Flowsheet Row Cardiac Rehab from 02/24/2023 in [facility] Cardiac and Pulmonary Rehab  Date 02/24/23  Educator [facility]  Instruction Review Code 1- Verbalizes Understanding        Other: -Provides group and verbal instruction on various topics (see comments)   Knowledge Questionnaire Score:   Core Components/Risk Factors/Patient Goals at Admission:  Personal Goals and Risk Factors at Admission - 02/24/23 1729       Core Components/Risk Factors/Patient Goals on Admission    Weight Management Yes    Intervention Weight Management: Develop a combined nutrition and exercise program designed to reach desired caloric intake, while maintaining appropriate intake of nutrient and fiber, sodium and fats, and appropriate energy expenditure required for the weight goal.;Weight Management: Provide education and appropriate resources to help participant work on and attain dietary goals.;Weight Management/Obesity: Establish reasonable short term and long term weight goals.    Admit Weight 220 lb (99.8 kg)    Goal Weight: Short Term 215 lb (97.5 kg)    Goal Weight: Long Term 200 lb (90.7 kg)    Expected Outcomes Short Term: Continue to assess and modify interventions until short term weight is achieved;Long Term: Adherence to nutrition and physical activity/exercise program aimed toward attainment of established weight goal;Weight Maintenance: Understanding of the daily nutrition guidelines, which includes 25-35% calories from fat, 7% or less cal from saturated fats, less than 200mg  cholesterol, less than 1.5gm of sodium, & 5 or more servings of fruits and vegetables daily;Weight Loss: Understanding of general recommendations for a balanced deficit meal plan, which promotes 1-2 lb weight loss per week and includes a negative energy balance of [phone] kcal/d;Understanding recommendations for meals to include 15-35% energy as protein, 25-35% energy from fat, 35-60%  energy from carbohydrates, less than 200mg  of dietary cholesterol, 20-35 gm of total fiber daily;Understanding of distribution of calorie intake throughout the day with the consumption of 4-5 meals/snacks    Hypertension Yes     Intervention Provide education on lifestyle modifcations including regular physical activity/exercise, weight management, moderate sodium restriction and increased consumption of fresh fruit, vegetables, and low fat dairy, alcohol moderation, and smoking cessation.;Monitor prescription use compliance.    Expected Outcomes Short Term: Continued assessment and intervention until BP is < 140/19mm HG in hypertensive participants. < 130/29mm HG in hypertensive participants with diabetes, heart failure or chronic kidney disease.;Long Term: Maintenance of blood pressure at goal levels.    Lipids Yes    Intervention Provide education and support for participant on nutrition & aerobic/resistive exercise along with prescribed medications to achieve LDL 70mg , HDL >40mg .    Expected Outcomes Short Term: Participant states understanding of desired cholesterol values and is compliant with medications prescribed. Participant is following exercise prescription and nutrition guidelines.;Long Term: Cholesterol controlled with medications as prescribed, with individualized exercise RX and with personalized nutrition plan. Value goals: LDL < 70mg , HDL > 40 mg.             Education:Diabetes - Individual verbal and written instruction to review signs/symptoms of diabetes, desired ranges of glucose level fasting, after meals and with exercise. Acknowledge that pre and post exercise glucose checks will be done for 3 sessions at entry of program.   Core Components/Risk Factors/Patient Goals Review:    Core Components/Risk Factors/Patient Goals at Discharge (Final Review):    ITP Comments:  ITP Comments     Row Name 02/17/23 1602 02/24/23 1715 03/11/23 1349       ITP Comments Initial phone call completed. Dx can be found in media scan dated 02/24/23. EP orientation scheduled for 6/24 at 3:00 pm. Completed and gym orientation. Initial ITP created and sent for review to Dr. Bethann Punches, Medical Director.  30 Day review completed. Medical Director ITP review done, changes made as directed, and signed approval by Medical Director.   New to program              Comments:

## 2023-03-11 NOTE — Progress Notes (Signed)
30 Day review completed. Medical Director ITP review done, changes made as directed, and signed approval by Medical Director. ? ?

## 2023-03-24 ENCOUNTER — Encounter: Payer: No Typology Code available for payment source | Attending: Nurse Practitioner | Admitting: *Deleted

## 2023-03-24 DIAGNOSIS — Z48812 Encounter for surgical aftercare following surgery on the circulatory system: Secondary | ICD-10-CM | POA: Insufficient documentation

## 2023-03-24 DIAGNOSIS — Z955 Presence of coronary angioplasty implant and graft: Secondary | ICD-10-CM | POA: Diagnosis present

## 2023-03-24 NOTE — Progress Notes (Signed)
Daily Session Note  Patient Details  Name: Geoffrey Wells MRN: [id number] Date of Birth: July 09, 1946 Referring Provider:   Flowsheet Row Cardiac Rehab from 02/24/2023 in [facility] Cardiac and Pulmonary Rehab  Referring Provider [name]       Encounter Date: 03/24/2023  Check In:  Session Check In - 03/24/23 1348       Check-In   Supervising physician immediately available to respond to emergencies See telemetry face sheet for immediately available ER MD    Location [facility]-Cardiac & Pulmonary Rehab    Staff Present Hulen Luster, BS, RRT, CPFT;Megan Katrinka Blazing, RN, ADN;Malgorzata Albert Jewel Baize, RN BSN    Virtual Visit No    Medication changes reported     No    Fall or balance concerns reported    No    Warm-up and Cool-down Performed on first and last piece of equipment    Resistance Training Performed Yes    VAD Patient? No    PAD/SET Patient? No      Pain Assessment   Currently in Pain? No/denies                Social History   Tobacco Use  Smoking Status Former   Current packs/day: 0.50   Average packs/day: 0.5 packs/day for 25.0 years (12.5 ttl pk-yrs)   Types: Cigarettes  Smokeless Tobacco Never    Goals Met:  Independence with exercise equipment Exercise tolerated well No report of concerns or symptoms today Strength training completed today  Goals Unmet:  Not Applicable  Comments: First full day of exercise!  Patient was oriented to gym and equipment including functions, settings, policies, and procedures.  Patient's individual exercise prescription and treatment plan were reviewed.  All starting workloads were established based on the results of the 6 minute walk test done at initial orientation visit.  The plan for exercise progression was also introduced and progression will be customized based on patient's performance and goals.     Dr. Bethann Punches is Medical Director for [facility] Cardiac Rehabilitation.  Dr. Vida Rigger is Medical Director for [facility]  Pulmonary Rehabilitation.

## 2023-03-26 ENCOUNTER — Encounter: Payer: No Typology Code available for payment source | Admitting: *Deleted

## 2023-03-26 DIAGNOSIS — Z955 Presence of coronary angioplasty implant and graft: Secondary | ICD-10-CM

## 2023-03-26 DIAGNOSIS — Z48812 Encounter for surgical aftercare following surgery on the circulatory system: Secondary | ICD-10-CM | POA: Diagnosis not present

## 2023-03-26 NOTE — Progress Notes (Signed)
Daily Session Note  Patient Details  Name: Geoffrey Wells MRN: [id number] Date of Birth: [birth year] Referring Provider:   Flowsheet Row Cardiac Rehab from 02/24/2023 in [facility] Cardiac and Pulmonary Rehab  Referring Provider [name]       Encounter Date: 03/26/2023  Check In:  Session Check In - 03/26/23 1347       Check-In   Supervising physician immediately available to respond to emergencies See telemetry face sheet for immediately available ER MD    Location [facility]-Cardiac & Pulmonary Rehab    Staff Present Susann Givens, RN Atilano Median, RN, ADN   Maggie Best and Max Conetta   Virtual Visit No    Medication changes reported     No    Fall or balance concerns reported    No    Warm-up and Cool-down Performed on first and last piece of equipment    Resistance Training Performed Yes    VAD Patient? No    PAD/SET Patient? No      Pain Assessment   Currently in Pain? No/denies                Social History   Tobacco Use  Smoking Status Former   Current packs/day: 0.50   Average packs/day: 0.5 packs/day for 25.0 years (12.5 ttl pk-yrs)   Types: Cigarettes  Smokeless Tobacco Never    Goals Met:  Independence with exercise equipment Exercise tolerated well No report of concerns or symptoms today Strength training completed today  Goals Unmet:  Not Applicable  Comments: Pt able to follow exercise prescription today without complaint.  Will continue to monitor for progression.    Dr. Bethann Punches is Medical Director for [facility] Cardiac Rehabilitation.  Dr. Vida Rigger is Medical Director for [facility] Pulmonary Rehabilitation.

## 2023-03-27 ENCOUNTER — Encounter: Payer: No Typology Code available for payment source | Admitting: *Deleted

## 2023-03-27 DIAGNOSIS — Z955 Presence of coronary angioplasty implant and graft: Secondary | ICD-10-CM

## 2023-03-27 DIAGNOSIS — Z48812 Encounter for surgical aftercare following surgery on the circulatory system: Secondary | ICD-10-CM | POA: Diagnosis not present

## 2023-03-27 NOTE — Progress Notes (Signed)
Daily Session Note  Patient Details  Name: Geoffrey Wells MRN: [id number] Date of Birth: 1946/03/30 Referring Provider:   Flowsheet Row Cardiac Rehab from 02/24/2023 in [facility] Cardiac and Pulmonary Rehab  Referring Provider [name]       Encounter Date: 03/27/2023  Check In:  Session Check In - 03/27/23 1339       Check-In   Supervising physician immediately available to respond to emergencies See telemetry face sheet for immediately available ER MD    Location [facility]-Cardiac & Pulmonary Rehab    Staff Present Elige Ko, RCP,RRT,BSRT;Asuna Peth Jewel Baize, RN BSN;Other   Max Conetta   Virtual Visit No    Medication changes reported     No    Fall or balance concerns reported    No    Warm-up and Cool-down Performed on first and last piece of equipment    Resistance Training Performed Yes    VAD Patient? No    PAD/SET Patient? No      Pain Assessment   Currently in Pain? No/denies                Social History   Tobacco Use  Smoking Status Former   Current packs/day: 0.50   Average packs/day: 0.5 packs/day for 25.0 years (12.5 ttl pk-yrs)   Types: Cigarettes  Smokeless Tobacco Never    Goals Met:  Independence with exercise equipment Exercise tolerated well No report of concerns or symptoms today Strength training completed today  Goals Unmet:  Not Applicable  Comments: Pt able to follow exercise prescription today without complaint.  Will continue to monitor for progression.    Dr. Bethann Punches is Medical Director for [facility] Cardiac Rehabilitation.  Dr. Vida Rigger is Medical Director for [facility] Pulmonary Rehabilitation.

## 2023-03-31 ENCOUNTER — Encounter: Payer: No Typology Code available for payment source | Admitting: *Deleted

## 2023-03-31 DIAGNOSIS — Z955 Presence of coronary angioplasty implant and graft: Secondary | ICD-10-CM

## 2023-03-31 DIAGNOSIS — Z48812 Encounter for surgical aftercare following surgery on the circulatory system: Secondary | ICD-10-CM | POA: Diagnosis not present

## 2023-03-31 NOTE — Progress Notes (Signed)
Daily Session Note  Patient Details  Name: Cy Barrish MRN: [id number] Date of Birth: Apr 12, 1946 Referring Provider:   Flowsheet Row Cardiac Rehab from 02/24/2023 in [facility] Cardiac and Pulmonary Rehab  Referring Provider [name]       Encounter Date: 03/31/2023  Check In:  Session Check In - 03/31/23 1349       Check-In   Supervising physician immediately available to respond to emergencies See telemetry face sheet for immediately available ER MD    Location [facility]-Cardiac & Pulmonary Rehab    Staff Present Susann Givens, RN Atilano Median, RN, Franki Monte, BS, ACSM CEP, Exercise Physiologist    Virtual Visit No    Medication changes reported     No    Fall or balance concerns reported    No    Warm-up and Cool-down Performed on first and last piece of equipment    Resistance Training Performed No    VAD Patient? No    PAD/SET Patient? No      Pain Assessment   Currently in Pain? No/denies                Social History   Tobacco Use  Smoking Status Former   Current packs/day: 0.50   Average packs/day: 0.5 packs/day for 25.0 years (12.5 ttl pk-yrs)   Types: Cigarettes  Smokeless Tobacco Never    Goals Met:  Independence with exercise equipment Exercise tolerated well No report of concerns or symptoms today Strength training completed today  Goals Unmet:  Not Applicable  Comments: Pt able to follow exercise prescription today without complaint.  Will continue to monitor for progression.    Dr. Bethann Punches is Medical Director for [facility] Cardiac Rehabilitation.  Dr. Vida Rigger is Medical Director for [facility] Pulmonary Rehabilitation.

## 2023-04-03 ENCOUNTER — Encounter: Payer: No Typology Code available for payment source | Attending: Nurse Practitioner | Admitting: *Deleted

## 2023-04-03 DIAGNOSIS — Z955 Presence of coronary angioplasty implant and graft: Secondary | ICD-10-CM | POA: Insufficient documentation

## 2023-04-03 DIAGNOSIS — Z48812 Encounter for surgical aftercare following surgery on the circulatory system: Secondary | ICD-10-CM | POA: Diagnosis not present

## 2023-04-03 NOTE — Progress Notes (Signed)
Daily Session Note  Patient Details  Name: Geoffrey Wells MRN: [id number] Date of Birth: Oct 10, 1945 Referring Provider:   Flowsheet Row Cardiac Rehab from 02/24/2023 in [facility] Cardiac and Pulmonary Rehab  Referring Provider [name]       Encounter Date: 04/03/2023  Check In:  Session Check In - 04/03/23 1422       Check-In   Supervising physician immediately available to respond to emergencies See telemetry face sheet for immediately available ER MD    Location [facility]-Cardiac & Pulmonary Rehab    Staff Present Lanny Hurst, RN, ADN;Joseph Hood, RCP,RRT,BSRT;Other   Max Manya Silvas, EP BS   Virtual Visit No    Medication changes reported     No    Fall or balance concerns reported    No    Warm-up and Cool-down Performed on first and last piece of equipment    Resistance Training Performed Yes    VAD Patient? No    PAD/SET Patient? No      Pain Assessment   Currently in Pain? No/denies                Social History   Tobacco Use  Smoking Status Former   Current packs/day: 0.50   Average packs/day: 0.5 packs/day for 25.0 years (12.5 ttl pk-yrs)   Types: Cigarettes  Smokeless Tobacco Never    Goals Met:  Independence with exercise equipment Exercise tolerated well No report of concerns or symptoms today Strength training completed today  Goals Unmet:  Not Applicable  Comments: Pt able to follow exercise prescription today without complaint.  Will continue to monitor for progression.    Dr. Bethann Punches is Medical Director for [facility] Cardiac Rehabilitation.  Dr. Vida Rigger is Medical Director for [facility] Pulmonary Rehabilitation.

## 2023-04-07 ENCOUNTER — Encounter: Payer: No Typology Code available for payment source | Admitting: *Deleted

## 2023-04-07 DIAGNOSIS — Z955 Presence of coronary angioplasty implant and graft: Secondary | ICD-10-CM

## 2023-04-07 DIAGNOSIS — Z48812 Encounter for surgical aftercare following surgery on the circulatory system: Secondary | ICD-10-CM | POA: Diagnosis not present

## 2023-04-07 NOTE — Progress Notes (Signed)
Daily Session Note  Patient Details  Name: Geoffrey Wells MRN: [id number] Date of Birth: 16-Apr-1946 Referring Provider:   Flowsheet Row Cardiac Rehab from 02/24/2023 in [facility] Cardiac and Pulmonary Rehab  Referring Provider [name]       Encounter Date: 04/07/2023  Check In:  Session Check In - 04/07/23 1342       Check-In   Supervising physician immediately available to respond to emergencies See telemetry face sheet for immediately available ER MD    Location [facility]-Cardiac & Pulmonary Rehab    Staff Present Lanny Hurst, RN, ADN;Joseph Reino Kent, RCP,RRT,BSRT; Jewel Baize, RN BSN    Virtual Visit No    Medication changes reported     No    Fall or balance concerns reported    No    Warm-up and Cool-down Performed on first and last piece of equipment    Resistance Training Performed Yes    VAD Patient? No    PAD/SET Patient? No      Pain Assessment   Currently in Pain? No/denies                Social History   Tobacco Use  Smoking Status Former   Current packs/day: 0.50   Average packs/day: 0.5 packs/day for 25.0 years (12.5 ttl pk-yrs)   Types: Cigarettes  Smokeless Tobacco Never    Goals Met:  Independence with exercise equipment Exercise tolerated well No report of concerns or symptoms today Strength training completed today  Goals Unmet:  Not Applicable  Comments: Pt able to follow exercise prescription today without complaint.  Will continue to monitor for progression.    Dr. Bethann Punches is Medical Director for [facility] Cardiac Rehabilitation.  Dr. Vida Rigger is Medical Director for [facility] Pulmonary Rehabilitation.

## 2023-04-09 ENCOUNTER — Encounter: Payer: No Typology Code available for payment source | Admitting: *Deleted

## 2023-04-09 ENCOUNTER — Encounter: Payer: Self-pay | Admitting: *Deleted

## 2023-04-09 DIAGNOSIS — Z48812 Encounter for surgical aftercare following surgery on the circulatory system: Secondary | ICD-10-CM | POA: Diagnosis not present

## 2023-04-09 DIAGNOSIS — Z955 Presence of coronary angioplasty implant and graft: Secondary | ICD-10-CM

## 2023-04-09 NOTE — Progress Notes (Signed)
Daily Session Note  Patient Details  Name: Geoffrey Wells MRN: [id number] Date of Birth: 1946/02/04 Referring Provider:   Flowsheet Row Cardiac Rehab from 02/24/2023 in [facility] Cardiac and Pulmonary Rehab  Referring Provider [name]       Encounter Date: 04/09/2023  Check In:  Session Check In - 04/09/23 1344       Check-In   Supervising physician immediately available to respond to emergencies See telemetry face sheet for immediately available ER MD    Location [facility]-Cardiac & Pulmonary Rehab    Staff Present Susann Givens, RN BSN;Joseph Reino Kent, RCP,RRT,BSRT;Megan Katrinka Blazing, RN, [place]    Virtual Visit No    Medication changes reported     No    Fall or balance concerns reported    No    Warm-up and Cool-down Performed on first and last piece of equipment    Resistance Training Performed Yes    VAD Patient? No    PAD/SET Patient? No      Pain Assessment   Currently in Pain? No/denies                Social History   Tobacco Use  Smoking Status Former   Current packs/day: 0.50   Average packs/day: 0.5 packs/day for 25.0 years (12.5 ttl pk-yrs)   Types: Cigarettes  Smokeless Tobacco Never    Goals Met:  Independence with exercise equipment Exercise tolerated well No report of concerns or symptoms today Strength training completed today  Goals Unmet:  Not Applicable  Comments: Pt able to follow exercise prescription today without complaint.  Will continue to monitor for progression.    Dr. Bethann Punches is Medical Director for [facility] Cardiac Rehabilitation.  Dr. Vida Rigger is Medical Director for [facility] Pulmonary Rehabilitation.

## 2023-04-09 NOTE — Progress Notes (Signed)
Cardiac Individual Treatment Plan  Patient Details  Name: Geoffrey Wells MRN: [id number] Date of Birth: 1946-08-25 Referring Provider:   Flowsheet Row Cardiac Rehab from 02/24/2023 in [facility] Cardiac and Pulmonary Rehab  Referring Provider [name]       Initial Encounter Date:  Flowsheet Row Cardiac Rehab from 02/24/2023 in [facility] Cardiac and Pulmonary Rehab  Date 02/24/23       Visit Diagnosis: Status post coronary artery stent placement  Patient's Home Medications on Admission:  Current Outpatient Medications:    acetaminophen (TYLENOL) 325 MG tablet, Take 1-2 tablets (325-650 mg total) by mouth every 4 (four) hours as needed for mild pain., Disp: , Rfl:    apixaban (ELIQUIS) 5 MG TABS tablet, Take 5 mg by mouth 2 (two) times daily., Disp: , Rfl:    b complex vitamins capsule, Take 1 capsule by mouth daily., Disp: , Rfl:    clopidogrel (PLAVIX) 75 MG tablet, Take 75 mg by mouth daily., Disp: , Rfl:    Coenzyme Q10 (CO Q10) 100 MG CAPS, Take 1 capsule by mouth daily., Disp: , Rfl:    Magnesium 500 MG TABS, Take 1 tablet (500 mg total) by mouth daily., Disp: 30 tablet, Rfl: 0   metoprolol succinate (TOPROL-XL) 50 MG 24 hr tablet, Take 25 mg by mouth daily., Disp: , Rfl:    Nutritional Supplements (JUICE PLUS FIBRE PO), Take 3 capsules by mouth in the morning and at bedtime., Disp: , Rfl:    omega-3 acid ethyl esters (LOVAZA) 1 g capsule, Take by mouth 2 (two) times daily., Disp: , Rfl:    rosuvastatin (CRESTOR) 20 MG tablet, Take 20 mg by mouth daily., Disp: , Rfl:    Vitamin D-Vitamin K (VITAMIN D2 + K1 PO), Take by mouth., Disp: , Rfl:   Past Medical History: Past Medical History:  Diagnosis Date   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    fatigue and "foggy brain"   Pneumonia due to COVID-19 virus 01/10/2020   hospitalization X 2   Pulmonary emboli (HCC)    post Covid   Throat cancer (HCC)    Radiotactic surgery    Tobacco Use: Social History    Tobacco Use  Smoking Status Former   Current packs/day: 0.50   Average packs/day: 0.5 packs/day for 25.0 years (12.5 ttl pk-yrs)   Types: Cigarettes  Smokeless Tobacco Never    Labs: Review Flowsheet       Latest Ref Rng & Units 01/19/2020 03/30/2021  Labs for ITP Cardiac and Pulmonary Rehab  Cholestrol 0 - 200 mg/dL - 696   LDL (calc) 0 - 99 mg/dL - 295   HDL-C >28 mg/dL - 67   Trlycerides <413 mg/dL - 89   Hemoglobin K4M 4.8 - 5.6 % - 5.8   Bicarbonate 20.0 - 28.0 mmol/L 24.1  -  Acid-base deficit 0.0 - 2.0 mmol/L 0.3  -  O2 Saturation % 69.7  -    Details             Exercise Target Goals: Exercise Program Goal: Individual exercise prescription set using results from initial 6 min walk test and THRR while considering  patient's activity barriers and safety.   Exercise Prescription Goal: Initial exercise prescription builds to 30-45 minutes a day of aerobic activity, 2-3 days per week.  Home exercise guidelines will be given to patient during program as part of exercise prescription that the participant will acknowledge.   Education: Aerobic Exercise: - Group  verbal and visual presentation on the components of exercise prescription. Introduces F.I.T.T principle from ACSM for exercise prescriptions.  Reviews F.I.T.T. principles of aerobic exercise including progression. Written material given at graduation.   Education: Resistance Exercise: - Group verbal and visual presentation on the components of exercise prescription. Introduces F.I.T.T principle from ACSM for exercise prescriptions  Reviews F.I.T.T. principles of resistance exercise including progression. Written material given at graduation.    Education: Exercise & Equipment Safety: - Individual verbal instruction and demonstration of equipment use and safety with use of the equipment. Flowsheet Row Cardiac Rehab from 02/24/2023 in [facility] Cardiac and Pulmonary Rehab  Date 02/24/23  Educator [facility]  Instruction  Review Code 1- Verbalizes Understanding       Education: Exercise Physiology & General Exercise Guidelines: - Group verbal and written instruction with models to review the exercise physiology of the cardiovascular system and associated critical values. Provides general exercise guidelines with specific guidelines to those with heart or lung disease.    Education: Flexibility, Balance, Mind/Body Relaxation: - Group verbal and visual presentation with interactive activity on the components of exercise prescription. Introduces F.I.T.T principle from ACSM for exercise prescriptions. Reviews F.I.T.T. principles of flexibility and balance exercise training including progression. Also discusses the mind body connection.  Reviews various relaxation techniques to help reduce and manage stress (i.e. Deep breathing, progressive muscle relaxation, and visualization). Balance handout provided to take home. Written material given at graduation.   Activity Barriers & Risk Stratification:  Activity Barriers & Cardiac Risk Stratification - 02/24/23 1725       Activity Barriers & Cardiac Risk Stratification   Activity Barriers Balance Concerns    Cardiac Risk Stratification High             6 Minute Walk:  6 Minute Walk     Row Name 02/24/23 1723         6 Minute Walk   Phase Initial     Distance 1200 feet     Walk Time 6 minutes     # of Rest Breaks 0     MPH 2.27     METS 2.3     RPE 11     Perceived Dyspnea  1     VO2 Peak 8.05     Symptoms Yes (comment)     Comments slight dizziness     Resting HR 61 bpm     Resting BP 122/70     Resting Oxygen Saturation  95 %     Exercise Oxygen Saturation  during 6 min walk 95 %     Max Ex. HR 89 bpm     Max Ex. BP 138/80     2 Minute Post BP 128/78              Oxygen Initial Assessment:   Oxygen Re-Evaluation:   Oxygen Discharge (Final Oxygen Re-Evaluation):   Initial Exercise Prescription:  Initial Exercise Prescription  - 02/24/23 1700       Date of Initial Exercise RX and Referring Provider   Date 02/24/23    Referring Provider [name]      Oxygen   Maintain Oxygen Saturation 88% or higher      Recumbant Bike   Level 2    RPM 50    Watts 15    Minutes 15      NuStep   Level 2    SPM 80    Minutes 15    METs 2.3  REL-XR   Level 2    Speed 50    Minutes 15    METs 2.3      Track   Laps 20    Minutes 15    METs 2.09      Prescription Details   Frequency (times per week) 3    Duration Progress to 30 minutes of continuous aerobic without signs/symptoms of physical distress      Intensity   THRR 40-80% of Max Heartrate 94-128    Ratings of Perceived Exertion 11-13    Perceived Dyspnea 0-4      Progression   Progression Continue to progress workloads to maintain intensity without signs/symptoms of physical distress.      Resistance Training   Training Prescription Yes    Weight 3    Reps 10-15             Perform Capillary Blood Glucose checks as needed.  Exercise Prescription Changes:   Exercise Prescription Changes     Row Name 02/24/23 1700 04/09/23 0900           Response to Exercise   Blood Pressure (Admit) 122/70 122/60      Blood Pressure (Exercise) 138/80 164/78      Blood Pressure (Exit) 128/78 106/52      Heart Rate (Admit) 61 bpm 58 bpm      Heart Rate (Exercise) 89 bpm 103 bpm      Heart Rate (Exit) 65 bpm 56 bpm      Oxygen Saturation (Admit) 95 % --      Oxygen Saturation (Exercise) 95 % --      Oxygen Saturation (Exit) 96 % --      Rating of Perceived Exertion (Exercise) 11 15      Perceived Dyspnea (Exercise) 1 --      Symptoms slight dizziness none      Comments 6 MWT results First two weeks of exercise      Duration -- Progress to 30 minutes of  aerobic without signs/symptoms of physical distress      Intensity -- THRR unchanged        Progression   Progression -- Continue to progress workloads to maintain intensity  without signs/symptoms of physical distress.      Average METs -- 2.6        Resistance Training   Training Prescription -- Yes      Weight -- 3 lb      Reps -- 10-15        Interval Training   Interval Training -- No        Recumbant Bike   Level -- 3      Watts -- 22      Minutes -- 15        REL-XR   Level -- 1      Minutes -- 15      METs -- 4.1        T5 Nustep   Level -- 2      Minutes -- 15      METs -- 1.9        Track   Laps -- 29      Minutes -- 15      METs -- 2.58        Oxygen   Maintain Oxygen Saturation -- 88% or higher               Exercise Comments:   Exercise Comments  Row Name 03/15/23 1051           Exercise Comments No 2 week review as he ahs not started his sessions.  Waiting for echo to be completed for AAA BP parameters to be provided to [place].                Exercise Goals and Review:   Exercise Goals     Row Name 02/24/23 1728             Exercise Goals   Increase Physical Activity Yes       Intervention Provide advice, education, support and counseling about physical activity/exercise needs.;Develop an individualized exercise prescription for aerobic and resistive training based on initial evaluation findings, risk stratification, comorbidities and participant's personal goals.       Expected Outcomes Short Term: Attend rehab on a regular basis to increase amount of physical activity.;Long Term: Exercising regularly at least 3-5 days a week.       Increase Strength and Stamina Yes       Intervention Provide advice, education, support and counseling about physical activity/exercise needs.;Develop an individualized exercise prescription for aerobic and resistive training based on initial evaluation findings, risk stratification, comorbidities and participant's personal goals.       Expected Outcomes Short Term: Increase workloads from initial exercise prescription for resistance, speed, and METs.;Short Term: Perform  resistance training exercises routinely during rehab and add in resistance training at home;Long Term: Improve cardiorespiratory fitness, muscular endurance and strength as measured by increased METs and functional capacity ( )       Able to understand and use rate of perceived exertion (RPE) scale Yes       Intervention Provide education and explanation on how to use RPE scale       Expected Outcomes Short Term: Able to use RPE daily in rehab to express subjective intensity level;Long Term:  Able to use RPE to guide intensity level when exercising independently       Able to understand and use Dyspnea scale Yes       Intervention Provide education and explanation on how to use Dyspnea scale       Expected Outcomes Short Term: Able to use Dyspnea scale daily in rehab to express subjective sense of shortness of breath during exertion;Long Term: Able to use Dyspnea scale to guide intensity level when exercising independently       Knowledge and understanding of Target Heart Rate Range (THRR) Yes       Intervention Provide education and explanation of THRR including how the numbers were predicted and where they are located for reference       Expected Outcomes Short Term: Able to state/look up THRR;Long Term: Able to use THRR to govern intensity when exercising independently;Short Term: Able to use daily as guideline for intensity in rehab       Able to check pulse independently Yes       Intervention Provide education and demonstration on how to check pulse in carotid and radial arteries.;Review the importance of being able to check your own pulse for safety during independent exercise       Expected Outcomes Short Term: Able to explain why pulse checking is important during independent exercise;Long Term: Able to check pulse independently and accurately       Understanding of Exercise Prescription Yes       Intervention Provide education, explanation, and written materials on patient's individual  exercise prescription  Expected Outcomes Short Term: Able to explain program exercise prescription;Long Term: Able to explain home exercise prescription to exercise independently                Exercise Goals Re-Evaluation :  Exercise Goals Re-Evaluation     Row Name 03/24/23 1350 04/09/23 1001           Exercise Goal Re-Evaluation   Exercise Goals Review Increase Physical Activity;Able to understand and use rate of perceived exertion (RPE) scale;Knowledge and understanding of Target Heart Rate Range (THRR);Understanding of Exercise Prescription;Increase Strength and Stamina;Able to check pulse independently Increase Physical Activity;Increase Strength and Stamina;Understanding of Exercise Prescription      Comments Reviewed RPE  and dyspnea scale, THR and program prescription with pt today.  Pt voiced understanding and was given a copy of goals to take home. Lyzander is off to a good start in the program. He did well on the recumbent bike at level 3, the T5 nustep at level 2, and the XR at level 1. He also got up to 29 laps on the track. We will continue to monitor his progress in the program.      Expected Outcomes Short: Use RPE daily to regulate intensity.  Long: Follow program prescription in THR. Short: Continue to follow current exercise prescription. Long: Continue exercise to improve strength and stamina.               Discharge Exercise Prescription (Final Exercise Prescription Changes):  Exercise Prescription Changes - 04/09/23 0900       Response to Exercise   Blood Pressure (Admit) 122/60    Blood Pressure (Exercise) 164/78    Blood Pressure (Exit) 106/52    Heart Rate (Admit) 58 bpm    Heart Rate (Exercise) 103 bpm    Heart Rate (Exit) 56 bpm    Rating of Perceived Exertion (Exercise) 15    Symptoms none    Comments First two weeks of exercise    Duration Progress to 30 minutes of  aerobic without signs/symptoms of physical distress    Intensity THRR unchanged       Progression   Progression Continue to progress workloads to maintain intensity without signs/symptoms of physical distress.    Average METs 2.6      Resistance Training   Training Prescription Yes    Weight 3 lb    Reps 10-15      Interval Training   Interval Training No      Recumbant Bike   Level 3    Watts 22    Minutes 15      REL-XR   Level 1    Minutes 15    METs 4.1      T5 Nustep   Level 2    Minutes 15    METs 1.9      Track   Laps 29    Minutes 15    METs 2.58      Oxygen   Maintain Oxygen Saturation 88% or higher             Nutrition:  Target Goals: Understanding of nutrition guidelines, daily intake of sodium 1500mg , cholesterol 200mg , calories 30% from fat and 7% or less from saturated fats, daily to have 5 or more servings of fruits and vegetables.  Education: All About Nutrition: -Group instruction provided by verbal, written material, interactive activities, discussions, models, and posters to present general guidelines for heart healthy nutrition including fat, fiber, MyPlate, the role of sodium  in heart healthy nutrition, utilization of the nutrition label, and utilization of this knowledge for meal planning. Follow up email sent as well. Written material given at graduation.   Biometrics:  Pre Biometrics - 02/24/23 1729       Pre Biometrics   Height 5' 10.5" (1.791 m)    Weight 210 lb 8 oz (95.5 kg)    Waist Circumference 40.5 inches    Hip Circumference 44.5 inches    Waist to Hip Ratio 0.91 %    BMI (Calculated) 29.77    Single Leg Stand 3.16 seconds              Nutrition Therapy Plan and Nutrition Goals:  Nutrition Therapy & Goals - 02/24/23 1730       Intervention Plan   Intervention Prescribe, educate and counsel regarding individualized specific dietary modifications aiming towards targeted core components such as weight, hypertension, lipid management, diabetes, heart failure and other comorbidities.     Expected Outcomes Short Term Goal: Understand basic principles of dietary content, such as calories, fat, sodium, cholesterol and nutrients.;Short Term Goal: A plan has been developed with personal nutrition goals set during dietitian appointment.;Long Term Goal: Adherence to prescribed nutrition plan.             Nutrition Assessments:  MEDIFICTS Score Key: ?70 Need to make dietary changes  40-70 Heart Healthy Diet ? 40 Therapeutic Level Cholesterol Diet   Picture Your Plate Scores: <08 Unhealthy dietary pattern with much room for improvement. 41-50 Dietary pattern unlikely to meet recommendations for good health and room for improvement. 51-60 More healthful dietary pattern, with some room for improvement.  >60 Healthy dietary pattern, although there may be some specific behaviors that could be improved.    Nutrition Goals Re-Evaluation:   Nutrition Goals Discharge (Final Nutrition Goals Re-Evaluation):   Psychosocial: Target Goals: Acknowledge presence or absence of significant depression and/or stress, maximize coping skills, provide positive support system. Participant is able to verbalize types and ability to use techniques and skills needed for reducing stress and depression.   Education: Stress, Anxiety, and Depression - Group verbal and visual presentation to define topics covered.  Reviews how body is impacted by stress, anxiety, and depression.  Also discusses healthy ways to reduce stress and to treat/manage anxiety and depression.  Written material given at graduation.   Education: Sleep Hygiene -Provides group verbal and written instruction about how sleep can affect your health.  Define sleep hygiene, discuss sleep cycles and impact of sleep habits. Review good sleep hygiene tips.    Initial Review & Psychosocial Screening:  Initial Psych Review & Screening - 02/17/23 1607       Initial Review   Current issues with Current Depression   [facility]     Family Dynamics   Good Support System? Yes   pt's wife     Screening Interventions   Expected Outcomes Short Term goal: Utilizing psychosocial counselor, staff and physician to assist with identification of specific Stressors or current issues interfering with healing process. Setting desired goal for each stressor or current issue identified.;Long Term Goal: Stressors or current issues are controlled or eliminated.;Short Term goal: Identification and review with participant of any Quality of Life or Depression concerns found by scoring the questionnaire.;Long Term goal: The participant improves quality of Life and PHQ9 Scores as seen by post scores and/or verbalization of changes             Quality of  Life Scores:   Quality of Life - 02/24/23 1740       Quality of Life   Select Quality of Life      Quality of Life Scores   Health/Function Pre 19.63 %    Socioeconomic Pre 24.42 %    Psych/Spiritual Pre 26.79 %    Family Pre 23.4 %    GLOBAL Pre 22.59 %            Scores of 19 and below usually indicate a poorer quality of life in these areas.  A difference of  2-3 points is a clinically meaningful difference.  A difference of 2-3 points in the total score of the Quality of Life Index has been associated with significant improvement in overall quality of life, self-image, physical symptoms, and general health in studies assessing change in quality of life.  PHQ-9: Review Flowsheet       02/24/2023  Depression screen PHQ 2/9  Decreased Interest 3  Down, Depressed, Hopeless 3  PHQ - 2 Score 6  Altered sleeping 0  Tired, decreased energy 3  Change in appetite 2  Feeling bad or failure about yourself  3  Trouble concentrating 2  Moving slowly or fidgety/restless 3  Suicidal thoughts 0  PHQ-9 Score 19  Difficult doing work/chores Not difficult at all    Details           Interpretation of Total Score  Total Score Depression Severity:  1-4 =  Minimal depression, 5-9 = Mild depression, 10-14 = Moderate depression, 15-19 = Moderately severe depression, 20-27 = Severe depression   Psychosocial Evaluation and Intervention:  Psychosocial Evaluation - 02/17/23 1613       Psychosocial Evaluation & Interventions   Comments Cleburne is coming to cardiac rehab post stent placement on 01/16/23. He has no barriers for attending the program. Spoke with Babyboy and his wife, Corrie Dandy, who appears to show great support. Colum's medical history also includes: aortic root dilatation (4.5cm), CVA, atrial fibrillation, h/o pulmonary embolism, HTN, HLD and valve insufficiency. Marquist and his wife are aware that we will fax blood pressure parameter form to referring provider and both voice understanding that we will need to receive signed form prior to starting exercise. Tonnie has weight loss goals, as well as would like to build strength back while in rehab. 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 Oluwatoni is hopeful to be able to get back to the gym after he completes rehab. Oliver does report some balance concern after his stroke, but does not use any assistive devices at this time. 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 Barton does have some concern with depression, and [facility]. Mckyle is looking forward to starting rehab and has no further questions at this time.    Expected Outcomes Short: Attend cardiac rehab for education and exercise. Long: Develop and maintain positive self care habits.    Continue Psychosocial Services  Follow up required by staff             Psychosocial Re-Evaluation:   Psychosocial Discharge (Final Psychosocial Re-Evaluation):   Vocational Rehabilitation: Provide vocational rehab assistance to qualifying candidates.   Vocational Rehab Evaluation & Intervention:  Vocational Rehab - 02/17/23 1607       Initial Vocational Rehab Evaluation & Intervention   Assessment  shows need for Vocational Rehabilitation No  Education: Education Goals: Education classes will be provided on a variety of topics geared toward better understanding of heart health and risk factor modification. Participant will state understanding/return demonstration of topics presented as noted by education test scores.  Learning Barriers/Preferences:  Learning Barriers/Preferences - 02/17/23 1553       Learning Barriers/Preferences   Learning Barriers None    Learning Preferences None             General Cardiac Education Topics:  AED/CPR: - Group verbal and written instruction with the use of models to demonstrate the basic use of the AED with the basic ABC's of resuscitation.   Anatomy and Cardiac Procedures: - Group verbal and visual presentation and models provide information about basic cardiac anatomy and function. Reviews the testing methods done to diagnose heart disease and the outcomes of the test results. Describes the treatment choices: Medical Management, Angioplasty, or Coronary Bypass Surgery for treating various heart conditions including Myocardial Infarction, Angina, Valve Disease, and Cardiac Arrhythmias.  Written material given at graduation.   Medication Safety: - Group verbal and visual instruction to review commonly prescribed medications for heart and lung disease. Reviews the medication, class of the drug, and side effects. Includes the steps to properly store meds and maintain the prescription regimen.  Written material given at graduation.   Intimacy: - Group verbal instruction through game format to discuss how heart and lung disease can affect sexual intimacy. Written material given at graduation..   Know Your Numbers and Heart Failure: - Group verbal and visual instruction to discuss disease risk factors for cardiac and pulmonary disease and treatment options.  Reviews associated critical values for Overweight/Obesity,  Hypertension, Cholesterol, and Diabetes.  Discusses basics of heart failure: signs/symptoms and treatments.  Introduces Heart Failure Zone chart for action plan for heart failure.  Written material given at graduation.   Infection Prevention: - Provides verbal and written material to individual with discussion of infection control including proper hand washing and proper equipment cleaning during exercise session. Flowsheet Row Cardiac Rehab from 02/24/2023 in [facility] Cardiac and Pulmonary Rehab  Date 02/24/23  Educator [facility]  Instruction Review Code 1- Verbalizes Understanding       Falls Prevention: - Provides verbal and written material to individual with discussion of falls prevention and safety. Flowsheet Row Cardiac Rehab from 02/24/2023 in [facility] Cardiac and Pulmonary Rehab  Date 02/24/23  Educator [facility]  Instruction Review Code 1- Verbalizes Understanding       Other: -Provides group and verbal instruction on various topics (see comments)   Knowledge Questionnaire Score:   Core Components/Risk Factors/Patient Goals at Admission:  Personal Goals and Risk Factors at Admission - 02/24/23 1729       Core Components/Risk Factors/Patient Goals on Admission    Weight Management Yes    Intervention Weight Management: Develop a combined nutrition and exercise program designed to reach desired caloric intake, while maintaining appropriate intake of nutrient and fiber, sodium and fats, and appropriate energy expenditure required for the weight goal.;Weight Management: Provide education and appropriate resources to help participant work on and attain dietary goals.;Weight Management/Obesity: Establish reasonable short term and long term weight goals.    Admit Weight 220 lb (99.8 kg)    Goal Weight: Short Term 215 lb (97.5 kg)    Goal Weight: Long Term 200 lb (90.7 kg)    Expected Outcomes Short Term: Continue to assess and modify interventions until short term weight is achieved;Long Term:  Adherence to nutrition and physical activity/exercise  program aimed toward attainment of established weight goal;Weight Maintenance: Understanding of the daily nutrition guidelines, which includes 25-35% calories from fat, 7% or less cal from saturated fats, less than 200mg  cholesterol, less than 1.5gm of sodium, & 5 or more servings of fruits and vegetables daily;Weight Loss: Understanding of general recommendations for a balanced deficit meal plan, which promotes 1-2 lb weight loss per week and includes a negative energy balance of [phone] kcal/d;Understanding recommendations for meals to include 15-35% energy as protein, 25-35% energy from fat, 35-60% energy from carbohydrates, less than 200mg  of dietary cholesterol, 20-35 gm of total fiber daily;Understanding of distribution of calorie intake throughout the day with the consumption of 4-5 meals/snacks    Hypertension Yes    Intervention Provide education on lifestyle modifcations including regular physical activity/exercise, weight management, moderate sodium restriction and increased consumption of fresh fruit, vegetables, and low fat dairy, alcohol moderation, and smoking cessation.;Monitor prescription use compliance.    Expected Outcomes Short Term: Continued assessment and intervention until BP is < 140/38mm HG in hypertensive participants. < 130/34mm HG in hypertensive participants with diabetes, heart failure or chronic kidney disease.;Long Term: Maintenance of blood pressure at goal levels.    Lipids Yes    Intervention Provide education and support for participant on nutrition & aerobic/resistive exercise along with prescribed medications to achieve LDL 70mg , HDL >40mg .    Expected Outcomes Short Term: Participant states understanding of desired cholesterol values and is compliant with medications prescribed. Participant is following exercise prescription and nutrition guidelines.;Long Term: Cholesterol controlled with medications as prescribed,  with individualized exercise RX and with personalized nutrition plan. Value goals: LDL < 70mg , HDL > 40 mg.             Education:Diabetes - Individual verbal and written instruction to review signs/symptoms of diabetes, desired ranges of glucose level fasting, after meals and with exercise. Acknowledge that pre and post exercise glucose checks will be done for 3 sessions at entry of program.   Core Components/Risk Factors/Patient Goals Review:    Core Components/Risk Factors/Patient Goals at Discharge (Final Review):    ITP Comments:  ITP Comments     Row Name 02/17/23 1602 02/24/23 1715 03/11/23 1349 03/24/23 1349 03/26/23 1137   ITP Comments Initial phone call completed. Dx can be found in media scan dated 02/24/23. EP orientation scheduled for 6/24 at 3:00 pm. Completed and gym orientation. Initial ITP created and sent for review to Dr. Bethann Punches, Medical Director. 30 Day review completed. Medical Director ITP review done, changes made as directed, and signed approval by Medical Director.   New to program First full day of exercise!  Patient was oriented to gym and equipment including functions, settings, policies, and procedures.  Patient's individual exercise prescription and treatment plan were reviewed.  All starting workloads were established based on the results of the 6 minute walk test done at initial orientation visit.  The plan for exercise progression was also introduced and progression will be customized based on patient's performance and goals. Zyonn had his initial Eval on 6/24. first session was 7/22. No exercise notes until he has attended sessions    Row Name 04/09/23 1108           ITP Comments 30 Day review completed. Medical Director ITP review done, changes made as directed, and signed approval by Medical Director.   new to program                Comments:

## 2023-04-10 DIAGNOSIS — Z48812 Encounter for surgical aftercare following surgery on the circulatory system: Secondary | ICD-10-CM | POA: Diagnosis not present

## 2023-04-10 DIAGNOSIS — Z955 Presence of coronary angioplasty implant and graft: Secondary | ICD-10-CM

## 2023-04-10 NOTE — Progress Notes (Signed)
Daily Session Note  Patient Details  Name: Geoffrey Wells MRN: [id number] Date of Birth: 06-Oct-1945 Referring Provider:   Flowsheet Row Cardiac Rehab from 02/24/2023 in [facility] Cardiac and Pulmonary Rehab  Referring Provider [name]       Encounter Date: 04/10/2023  Check In:  Session Check In - 04/10/23 1335       Check-In   Supervising physician immediately available to respond to emergencies See telemetry face sheet for immediately available ER MD    Location [facility]-Cardiac & Pulmonary Rehab    Staff Present Ronette Deter, BS, Exercise Physiologist; Reino Kent, [place]    Virtual Visit No    Medication changes reported     No    Fall or balance concerns reported    No    Warm-up and Cool-down Performed on first and last piece of equipment    Resistance Training Performed Yes    VAD Patient? No      Pain Assessment   Currently in Pain? No/denies    Multiple Pain Sites No                Social History   Tobacco Use  Smoking Status Former   Current packs/day: 0.50   Average packs/day: 0.5 packs/day for 25.0 years (12.5 ttl pk-yrs)   Types: Cigarettes  Smokeless Tobacco Never    Goals Met:  Independence with exercise equipment Exercise tolerated well No report of concerns or symptoms today Strength training completed today  Goals Unmet:  Not Applicable  Comments: Pt able to follow exercise prescription today without complaint.  Will continue to monitor for progression.    Dr. Bethann Punches is Medical Director for [facility] Cardiac Rehabilitation.  Dr. Vida Rigger is Medical Director for [facility] Pulmonary Rehabilitation.

## 2023-04-14 ENCOUNTER — Encounter: Payer: No Typology Code available for payment source | Admitting: *Deleted

## 2023-04-14 DIAGNOSIS — Z48812 Encounter for surgical aftercare following surgery on the circulatory system: Secondary | ICD-10-CM | POA: Diagnosis not present

## 2023-04-14 DIAGNOSIS — Z955 Presence of coronary angioplasty implant and graft: Secondary | ICD-10-CM

## 2023-04-14 NOTE — Progress Notes (Signed)
Daily Session Note  Patient Details  Name: Geoffrey Wells MRN: [id number] Date of Birth: 03-May-1946 Referring Provider:   Flowsheet Row Cardiac Rehab from 02/24/2023 in [facility] Cardiac and Pulmonary Rehab  Referring Provider [name]       Encounter Date: 04/14/2023  Check In:  Session Check In - 04/14/23 1338       Check-In   Supervising physician immediately available to respond to emergencies See telemetry face sheet for immediately available ER MD    Location [facility]-Cardiac & Pulmonary Rehab    Staff Present Lanny Hurst, RN, ADN;Joseph Reino Kent, RCP,RRT,BSRT; Jewel Baize, RN BSN    Virtual Visit No    Medication changes reported     No    Fall or balance concerns reported    No    Warm-up and Cool-down Performed on first and last piece of equipment    Resistance Training Performed Yes    VAD Patient? No    PAD/SET Patient? No      Pain Assessment   Currently in Pain? No/denies                Social History   Tobacco Use  Smoking Status Former   Current packs/day: 0.50   Average packs/day: 0.5 packs/day for 25.0 years (12.5 ttl pk-yrs)   Types: Cigarettes  Smokeless Tobacco Never    Goals Met:  Independence with exercise equipment Exercise tolerated well No report of concerns or symptoms today Strength training completed today  Goals Unmet:  Not Applicable  Comments: Pt able to follow exercise prescription today without complaint.  Will continue to monitor for progression.    Dr. Bethann Punches is Medical Director for [facility] Cardiac Rehabilitation.  Dr. Vida Rigger is Medical Director for [facility] Pulmonary Rehabilitation.

## 2023-04-16 ENCOUNTER — Encounter: Payer: No Typology Code available for payment source | Admitting: *Deleted

## 2023-04-16 DIAGNOSIS — Z955 Presence of coronary angioplasty implant and graft: Secondary | ICD-10-CM

## 2023-04-16 DIAGNOSIS — Z48812 Encounter for surgical aftercare following surgery on the circulatory system: Secondary | ICD-10-CM | POA: Diagnosis not present

## 2023-04-16 NOTE — Progress Notes (Signed)
Daily Session Note  Patient Details  Name: Geoffrey Wells MRN: [id number] Date of Birth: September 28, 1945 Referring Provider:   Flowsheet Row Cardiac Rehab from 02/24/2023 in [facility] Cardiac and Pulmonary Rehab  Referring Provider [name]       Encounter Date: 04/16/2023  Check In:  Session Check In - 04/16/23 1340       Check-In   Supervising physician immediately available to respond to emergencies See telemetry face sheet for immediately available ER MD    Location [facility]-Cardiac & Pulmonary Rehab    Staff Present Hulen Luster, BS, RRT, CPFT; Jewel Baize, RN Atilano Median, RN, ADN    Virtual Visit No    Medication changes reported     No    Fall or balance concerns reported    No    Warm-up and Cool-down Performed on first and last piece of equipment    Resistance Training Performed Yes    VAD Patient? No    PAD/SET Patient? No      Pain Assessment   Currently in Pain? No/denies                Social History   Tobacco Use  Smoking Status Former   Current packs/day: 0.50   Average packs/day: 0.5 packs/day for 25.0 years (12.5 ttl pk-yrs)   Types: Cigarettes  Smokeless Tobacco Never    Goals Met:  Independence with exercise equipment Exercise tolerated well No report of concerns or symptoms today Strength training completed today  Goals Unmet:  Not Applicable  Comments: Pt able to follow exercise prescription today without complaint.  Will continue to monitor for progression.    Dr. Bethann Punches is Medical Director for [facility] Cardiac Rehabilitation.  Dr. Vida Rigger is Medical Director for [facility] Pulmonary Rehabilitation.

## 2023-04-17 ENCOUNTER — Encounter: Payer: No Typology Code available for payment source | Admitting: *Deleted

## 2023-04-17 DIAGNOSIS — Z955 Presence of coronary angioplasty implant and graft: Secondary | ICD-10-CM

## 2023-04-17 DIAGNOSIS — Z48812 Encounter for surgical aftercare following surgery on the circulatory system: Secondary | ICD-10-CM | POA: Diagnosis not present

## 2023-04-17 NOTE — Progress Notes (Signed)
Daily Session Note  Patient Details  Name: Geoffrey Wells MRN: [id number] Date of Birth: 1946-01-07 Referring Provider:   Flowsheet Row Cardiac Rehab from 02/24/2023 in [facility] Cardiac and Pulmonary Rehab  Referring Provider [name]       Encounter Date: 04/17/2023  Check In:  Session Check In - 04/17/23 1350       Check-In   Supervising physician immediately available to respond to emergencies See telemetry face sheet for immediately available ER MD    Location [facility]-Cardiac & Pulmonary Rehab    Staff Present Rory Percy, MS, Exercise Physiologist;Maxon Conetta BS, , Exercise Physiologist; Katrinka Blazing, RN, ADN    Virtual Visit No    Medication changes reported     No    Fall or balance concerns reported    No    Warm-up and Cool-down Performed on first and last piece of equipment    Resistance Training Performed Yes    VAD Patient? No    PAD/SET Patient? No      Pain Assessment   Currently in Pain? No/denies                Social History   Tobacco Use  Smoking Status Former   Current packs/day: 0.50   Average packs/day: 0.5 packs/day for 25.0 years (12.5 ttl pk-yrs)   Types: Cigarettes  Smokeless Tobacco Never    Goals Met:  Independence with exercise equipment Exercise tolerated well No report of concerns or symptoms today Strength training completed today  Goals Unmet:  Not Applicable  Comments: Pt able to follow exercise prescription today without complaint.  Will continue to monitor for progression.    Dr. Bethann Punches is Medical Director for [facility] Cardiac Rehabilitation.  Dr. Vida Rigger is Medical Director for [facility] Pulmonary Rehabilitation.

## 2023-04-21 ENCOUNTER — Encounter: Payer: No Typology Code available for payment source | Admitting: *Deleted

## 2023-04-21 DIAGNOSIS — Z48812 Encounter for surgical aftercare following surgery on the circulatory system: Secondary | ICD-10-CM | POA: Diagnosis not present

## 2023-04-21 DIAGNOSIS — Z955 Presence of coronary angioplasty implant and graft: Secondary | ICD-10-CM

## 2023-04-21 NOTE — Progress Notes (Signed)
Daily Session Note  Patient Details  Name: Geoffrey Wells MRN: [id number] Date of Birth: [birth year] Referring Provider:   Flowsheet Row Cardiac Rehab from 02/24/2023 in [facility] Cardiac and Pulmonary Rehab  Referring Provider [name]       Encounter Date: 04/21/2023  Check In:  Session Check In - 04/21/23 1333       Check-In   Supervising physician immediately available to respond to emergencies See telemetry face sheet for immediately available ER MD    Location [facility]-Cardiac & Pulmonary Rehab    Staff Present Susann Givens, RN Atilano Median, RN, ADN;Laureen Manson Passey, BS, RRT, CPFT    Virtual Visit No    Medication changes reported     No    Fall or balance concerns reported    No    Warm-up and Cool-down Performed on first and last piece of equipment    Resistance Training Performed Yes    VAD Patient? No    PAD/SET Patient? No      Pain Assessment   Currently in Pain? No/denies                Social History   Tobacco Use  Smoking Status Former   Current packs/day: 0.50   Average packs/day: 0.5 packs/day for 25.0 years (12.5 ttl pk-yrs)   Types: Cigarettes  Smokeless Tobacco Never    Goals Met:  Independence with exercise equipment Exercise tolerated well No report of concerns or symptoms today Strength training completed today  Goals Unmet:  Not Applicable  Comments: Pt able to follow exercise prescription today without complaint.  Will continue to monitor for progression.    Dr. Bethann Punches is Medical Director for [facility] Cardiac Rehabilitation.  Dr. Vida Rigger is Medical Director for [facility] Pulmonary Rehabilitation.

## 2023-04-23 ENCOUNTER — Encounter: Payer: No Typology Code available for payment source | Admitting: *Deleted

## 2023-04-23 DIAGNOSIS — Z48812 Encounter for surgical aftercare following surgery on the circulatory system: Secondary | ICD-10-CM | POA: Diagnosis not present

## 2023-04-23 DIAGNOSIS — Z955 Presence of coronary angioplasty implant and graft: Secondary | ICD-10-CM

## 2023-04-23 NOTE — Progress Notes (Signed)
Daily Session Note  Patient Details  Name: Geoffrey Wells MRN: [id number] Date of Birth: [birth year] Referring Provider:   Flowsheet Row Cardiac Rehab from 02/24/2023 in [facility] Cardiac and Pulmonary Rehab  Referring Provider [name]       Encounter Date: 04/23/2023  Check In:  Session Check In - 04/23/23 1336       Check-In   Supervising physician immediately available to respond to emergencies See telemetry face sheet for immediately available ER MD    Location [facility]-Cardiac & Pulmonary Rehab    Staff Present Lanny Hurst, RN, ADN;Garnell Phenix Jewel Baize, RN BSN;Margaret Best, MS, Exercise Physiologist    Virtual Visit No    Medication changes reported     No    Fall or balance concerns reported    No    Warm-up and Cool-down Performed on first and last piece of equipment    Resistance Training Performed Yes    VAD Patient? No    PAD/SET Patient? No      Pain Assessment   Currently in Pain? No/denies                Social History   Tobacco Use  Smoking Status Former   Current packs/day: 0.50   Average packs/day: 0.5 packs/day for 25.0 years (12.5 ttl pk-yrs)   Types: Cigarettes  Smokeless Tobacco Never    Goals Met:  Independence with exercise equipment Exercise tolerated well No report of concerns or symptoms today Strength training completed today  Goals Unmet:  Not Applicable  Comments: Pt able to follow exercise prescription today without complaint.  Will continue to monitor for progression.    Dr. Bethann Punches is Medical Director for [facility] Cardiac Rehabilitation.  Dr. Vida Rigger is Medical Director for [facility] Pulmonary Rehabilitation.

## 2023-04-24 ENCOUNTER — Encounter: Payer: No Typology Code available for payment source | Admitting: *Deleted

## 2023-04-24 DIAGNOSIS — Z48812 Encounter for surgical aftercare following surgery on the circulatory system: Secondary | ICD-10-CM | POA: Diagnosis not present

## 2023-04-24 DIAGNOSIS — Z955 Presence of coronary angioplasty implant and graft: Secondary | ICD-10-CM

## 2023-04-24 NOTE — Progress Notes (Signed)
Daily Session Note  Patient Details  Name: Geoffrey Wells MRN: [id number] Date of Birth: 09/18/1945 Referring Provider:   Flowsheet Row Cardiac Rehab from 02/24/2023 in [facility] Cardiac and Pulmonary Rehab  Referring Provider [name]       Encounter Date: 04/24/2023  Check In:  Session Check In - 04/24/23 1422       Check-In   Supervising physician immediately available to respond to emergencies See telemetry face sheet for immediately available ER MD    Location [facility]-Cardiac & Pulmonary Rehab    Staff Present Rory Percy, MS, Exercise Physiologist;Maxon Conetta BS, , Exercise Physiologist;Martel Galvan Katrinka Blazing, RN, ADN    Virtual Visit No    Medication changes reported     No    Fall or balance concerns reported    No    Warm-up and Cool-down Performed on first and last piece of equipment    Resistance Training Performed Yes    VAD Patient? No    PAD/SET Patient? No      Pain Assessment   Currently in Pain? No/denies                Social History   Tobacco Use  Smoking Status Former   Current packs/day: 0.50   Average packs/day: 0.5 packs/day for 25.0 years (12.5 ttl pk-yrs)   Types: Cigarettes  Smokeless Tobacco Never    Goals Met:  Independence with exercise equipment Exercise tolerated well No report of concerns or symptoms today Strength training completed today  Goals Unmet:  Not Applicable  Comments: Pt able to follow exercise prescription today without complaint.  Will continue to monitor for progression.    Dr. Bethann Punches is Medical Director for [facility] Cardiac Rehabilitation.  Dr. Vida Rigger is Medical Director for [facility] Pulmonary Rehabilitation.

## 2023-04-28 ENCOUNTER — Encounter: Payer: No Typology Code available for payment source | Admitting: *Deleted

## 2023-04-28 DIAGNOSIS — Z955 Presence of coronary angioplasty implant and graft: Secondary | ICD-10-CM

## 2023-04-28 DIAGNOSIS — Z48812 Encounter for surgical aftercare following surgery on the circulatory system: Secondary | ICD-10-CM | POA: Diagnosis not present

## 2023-04-28 NOTE — Progress Notes (Signed)
Daily Session Note  Patient Details  Name: Geoffrey Wells MRN: [id number] Date of Birth: [birth year] Referring Provider:   Flowsheet Row Cardiac Rehab from 02/24/2023 in [facility] Cardiac and Pulmonary Rehab  Referring Provider [name]       Encounter Date: 04/28/2023  Check In:  Session Check In - 04/28/23 1408       Check-In   Supervising physician immediately available to respond to emergencies See telemetry face sheet for immediately available ER MD    Location [facility]-Cardiac & Pulmonary Rehab    Staff Present Cyndia Diver, RN, BSN, Erle Crocker, BS, ACSM CEP, Exercise Physiologist;Megan Katrinka Blazing, RN, ADN;Joseph Reino Kent, RCP,RRT,BSRT    Virtual Visit No    Medication changes reported     No    Fall or balance concerns reported    No    Tobacco Cessation No Change    Warm-up and Cool-down Performed on first and last piece of equipment    Resistance Training Performed Yes    VAD Patient? No    PAD/SET Patient? No      Pain Assessment   Currently in Pain? No/denies                Social History   Tobacco Use  Smoking Status Former   Current packs/day: 0.50   Average packs/day: 0.5 packs/day for 25.0 years (12.5 ttl pk-yrs)   Types: Cigarettes  Smokeless Tobacco Never    Goals Met:  Independence with exercise equipment Exercise tolerated well No report of concerns or symptoms today  Goals Unmet:  Not Applicable  Comments: Pt able to follow exercise prescription today without complaint.  Will continue to monitor for progression.    Dr. Bethann Punches is Medical Director for [facility] Cardiac Rehabilitation.  Dr. Vida Rigger is Medical Director for [facility] Pulmonary Rehabilitation.

## 2023-05-07 ENCOUNTER — Encounter: Payer: Self-pay | Admitting: *Deleted

## 2023-05-07 ENCOUNTER — Encounter: Payer: No Typology Code available for payment source | Attending: Nurse Practitioner | Admitting: *Deleted

## 2023-05-07 VITALS — Ht 70.5 in | Wt 212.4 lb

## 2023-05-07 DIAGNOSIS — Z955 Presence of coronary angioplasty implant and graft: Secondary | ICD-10-CM

## 2023-05-07 DIAGNOSIS — Z48812 Encounter for surgical aftercare following surgery on the circulatory system: Secondary | ICD-10-CM | POA: Insufficient documentation

## 2023-05-07 NOTE — Progress Notes (Signed)
Daily Session Note  Patient Details  Name: Geoffrey Wells MRN: [id number] Date of Birth: [birth year] Referring Provider:   Flowsheet Row Cardiac Rehab from 02/24/2023 in [facility] Cardiac and Pulmonary Rehab  Referring Provider [name]       Encounter Date: 05/07/2023  Check In:  Session Check In - 05/07/23 1338       Check-In   Supervising physician immediately available to respond to emergencies See telemetry face sheet for immediately available ER MD    Location [facility]-Cardiac & Pulmonary Rehab    Staff Present Susann Givens, RN Atilano Median, RN, Franki Monte, BS, ACSM CEP, Exercise Physiologist    Virtual Visit No    Medication changes reported     No    Fall or balance concerns reported    No    Warm-up and Cool-down Performed on first and last piece of equipment    Resistance Training Performed Yes    VAD Patient? No      Pain Assessment   Currently in Pain? No/denies                Social History   Tobacco Use  Smoking Status Former   Current packs/day: 0.50   Average packs/day: 0.5 packs/day for 25.0 years (12.5 ttl pk-yrs)   Types: Cigarettes  Smokeless Tobacco Never    Goals Met:  Independence with exercise equipment Exercise tolerated well No report of concerns or symptoms today Strength training completed today  Goals Unmet:  Not Applicable  Comments:  Geoffrey Wells graduated early today from  rehab with 17 sessions completed.  Details of the patient's exercise prescription and what He needs to do in order to continue the prescription and progress were discussed with patient.  Patient was given a copy of prescription and goals.  Patient verbalized understanding. Geoffrey Wells plans to continue to exercise by walking.     Dr. Bethann Punches is Medical Director for [facility] Cardiac Rehabilitation.  Dr. Vida Rigger is Medical Director for [facility] Pulmonary Rehabilitation.

## 2023-05-07 NOTE — Progress Notes (Signed)
Discharge Summary   Geoffrey Wells DOB: 25-Feb-2046  Geoffrey Wells graduated early today from  rehab with 17 sessions completed.  Details of the patient's exercise prescription and what He needs to do in order to continue the prescription and progress were discussed with patient.  Patient was given a copy of prescription and goals.  Patient verbalized understanding. Geoffrey Wells plans to continue to exercise by walking.   6 Minute Walk     Row Name 02/24/23 1723 05/07/23 1355       6 Minute Walk   Phase Initial Discharge    Distance 1200 feet 1400 feet    Distance % Change -- 16.7 %    Distance Feet Change -- 200 ft    Walk Time 6 minutes 6 minutes    # of Rest Breaks 0 0    MPH 2.27 2.65    METS 2.3 2.79    RPE 11 11    Perceived Dyspnea  1 0    VO2 Peak 8.05 9.76    Symptoms Yes (comment) No    Comments slight dizziness --    Resting HR 61 bpm 53 bpm    Resting BP 122/70 130/66    Resting Oxygen Saturation  95 % 97 %    Exercise Oxygen Saturation  during 6 min walk 95 % 97 %    Max Ex. HR 89 bpm 100 bpm    Max Ex. BP 138/80 144/72    2 Minute Post BP 128/78 --

## 2023-05-07 NOTE — Progress Notes (Signed)
Cardiac Individual Treatment Plan  Patient Details  Name: Geoffrey Wells MRN: [id number] Date of Birth: 1946-03-08 Referring Provider:   Flowsheet Row Cardiac Rehab from 02/24/2023 in [facility] Cardiac and Pulmonary Rehab  Referring Provider [name]       Initial Encounter Date:  Flowsheet Row Cardiac Rehab from 02/24/2023 in [facility] Cardiac and Pulmonary Rehab  Date 02/24/23       Visit Diagnosis: Status post coronary artery stent placement  Patient's Home Medications on Admission:  Current Outpatient Medications:    acetaminophen (TYLENOL) 325 MG tablet, Take 1-2 tablets (325-650 mg total) by mouth every 4 (four) hours as needed for mild pain., Disp: , Rfl:    apixaban (ELIQUIS) 5 MG TABS tablet, Take 5 mg by mouth 2 (two) times daily., Disp: , Rfl:    b complex vitamins capsule, Take 1 capsule by mouth daily., Disp: , Rfl:    clopidogrel (PLAVIX) 75 MG tablet, Take 75 mg by mouth daily., Disp: , Rfl:    Coenzyme Q10 (CO Q10) 100 MG CAPS, Take 1 capsule by mouth daily., Disp: , Rfl:    Magnesium 500 MG TABS, Take 1 tablet (500 mg total) by mouth daily., Disp: 30 tablet, Rfl: 0   metoprolol succinate (TOPROL-XL) 50 MG 24 hr tablet, Take 25 mg by mouth daily., Disp: , Rfl:    Nutritional Supplements (JUICE PLUS FIBRE PO), Take 3 capsules by mouth in the morning and at bedtime., Disp: , Rfl:    omega-3 acid ethyl esters (LOVAZA) 1 g capsule, Take by mouth 2 (two) times daily., Disp: , Rfl:    rosuvastatin (CRESTOR) 20 MG tablet, Take 20 mg by mouth daily., Disp: , Rfl:    Vitamin D-Vitamin K (VITAMIN D2 + K1 PO), Take by mouth., Disp: , Rfl:   Past Medical History: Past Medical History:  Diagnosis Date   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    fatigue and "foggy brain"   Pneumonia due to COVID-19 virus 01/10/2020   hospitalization X 2   Pulmonary emboli (HCC)    post Covid   Throat cancer (HCC)    Radiotactic surgery    Tobacco Use: Social History    Tobacco Use  Smoking Status Former   Current packs/day: 0.50   Average packs/day: 0.5 packs/day for 25.0 years (12.5 ttl pk-yrs)   Types: Cigarettes  Smokeless Tobacco Never    Labs: Review Flowsheet       Latest Ref Rng & Units 01/19/2020 03/30/2021  Labs for ITP Cardiac and Pulmonary Rehab  Cholestrol 0 - 200 mg/dL - 657   LDL (calc) 0 - 99 mg/dL - 846   HDL-C >96 mg/dL - 67   Trlycerides <295 mg/dL - 89   Hemoglobin M8U 4.8 - 5.6 % - 5.8   Bicarbonate 20.0 - 28.0 mmol/L 24.1  -  Acid-base deficit 0.0 - 2.0 mmol/L 0.3  -  O2 Saturation % 69.7  -    Details             Exercise Target Goals: Exercise Program Goal: Individual exercise prescription set using results from initial 6 min walk test and THRR while considering  patient's activity barriers and safety.   Exercise Prescription Goal: Initial exercise prescription builds to 30-45 minutes a day of aerobic activity, 2-3 days per week.  Home exercise guidelines will be given to patient during program as part of exercise prescription that the participant will acknowledge.   Education: Aerobic Exercise: - Group  verbal and visual presentation on the components of exercise prescription. Introduces F.I.T.T principle from ACSM for exercise prescriptions.  Reviews F.I.T.T. principles of aerobic exercise including progression. Written material given at graduation.   Education: Resistance Exercise: - Group verbal and visual presentation on the components of exercise prescription. Introduces F.I.T.T principle from ACSM for exercise prescriptions  Reviews F.I.T.T. principles of resistance exercise including progression. Written material given at graduation.    Education: Exercise & Equipment Safety: - Individual verbal instruction and demonstration of equipment use and safety with use of the equipment. Flowsheet Row Cardiac Rehab from 02/24/2023 in [facility] Cardiac and Pulmonary Rehab  Date 02/24/23  Educator [facility]  Instruction  Review Code 1- Verbalizes Understanding       Education: Exercise Physiology & General Exercise Guidelines: - Group verbal and written instruction with models to review the exercise physiology of the cardiovascular system and associated critical values. Provides general exercise guidelines with specific guidelines to those with heart or lung disease.    Education: Flexibility, Balance, Mind/Body Relaxation: - Group verbal and visual presentation with interactive activity on the components of exercise prescription. Introduces F.I.T.T principle from ACSM for exercise prescriptions. Reviews F.I.T.T. principles of flexibility and balance exercise training including progression. Also discusses the mind body connection.  Reviews various relaxation techniques to help reduce and manage stress (i.e. Deep breathing, progressive muscle relaxation, and visualization). Balance handout provided to take home. Written material given at graduation.   Activity Barriers & Risk Stratification:  Activity Barriers & Cardiac Risk Stratification - 02/24/23 1725       Activity Barriers & Cardiac Risk Stratification   Activity Barriers Balance Concerns    Cardiac Risk Stratification High             6 Minute Walk:  6 Minute Walk     Row Name 02/24/23 1723         6 Minute Walk   Phase Initial     Distance 1200 feet     Walk Time 6 minutes     # of Rest Breaks 0     MPH 2.27     METS 2.3     RPE 11     Perceived Dyspnea  1     VO2 Peak 8.05     Symptoms Yes (comment)     Comments slight dizziness     Resting HR 61 bpm     Resting BP 122/70     Resting Oxygen Saturation  95 %     Exercise Oxygen Saturation  during 6 min walk 95 %     Max Ex. HR 89 bpm     Max Ex. BP 138/80     2 Minute Post BP 128/78              Oxygen Initial Assessment:   Oxygen Re-Evaluation:   Oxygen Discharge (Final Oxygen Re-Evaluation):   Initial Exercise Prescription:  Initial Exercise Prescription  - 02/24/23 1700       Date of Initial Exercise RX and Referring Provider   Date 02/24/23    Referring Provider [name]      Oxygen   Maintain Oxygen Saturation 88% or higher      Recumbant Bike   Level 2    RPM 50    Watts 15    Minutes 15      NuStep   Level 2    SPM 80    Minutes 15    METs 2.3  REL-XR   Level 2    Speed 50    Minutes 15    METs 2.3      Track   Laps 20    Minutes 15    METs 2.09      Prescription Details   Frequency (times per week) 3    Duration Progress to 30 minutes of continuous aerobic without signs/symptoms of physical distress      Intensity   THRR 40-80% of Max Heartrate 94-128    Ratings of Perceived Exertion 11-13    Perceived Dyspnea 0-4      Progression   Progression Continue to progress workloads to maintain intensity without signs/symptoms of physical distress.      Resistance Training   Training Prescription Yes    Weight 3    Reps 10-15             Perform Capillary Blood Glucose checks as needed.  Exercise Prescription Changes:   Exercise Prescription Changes     Row Name 02/24/23 1700 04/09/23 0900 04/22/23 0700 04/24/23 1500 05/06/23 1300     Response to Exercise   Blood Pressure (Admit) 122/70 122/60 132/70 -- 102/60   Blood Pressure (Exercise) 138/80 164/78 152/70 -- --   Blood Pressure (Exit) 128/78 106/52 122/68 -- 118/66   Heart Rate (Admit) 61 bpm 58 bpm 66 bpm -- 50 bpm   Heart Rate (Exercise) 89 bpm 103 bpm 145 bpm -- 114 bpm   Heart Rate (Exit) 65 bpm 56 bpm 75 bpm -- 73 bpm   Oxygen Saturation (Admit) 95 % -- -- -- --   Oxygen Saturation (Exercise) 95 % -- -- -- --   Oxygen Saturation (Exit) 96 % -- -- -- --   Rating of Perceived Exertion (Exercise) 11 15 14  -- 14   Perceived Dyspnea (Exercise) 1 -- -- -- --   Symptoms slight dizziness none none none none   Comments 6 MWT results First two weeks of exercise -- -- --   Duration -- Progress to 30 minutes of  aerobic without  signs/symptoms of physical distress Progress to 30 minutes of  aerobic without signs/symptoms of physical distress -- Progress to 30 minutes of  aerobic without signs/symptoms of physical distress   Intensity -- THRR unchanged THRR unchanged -- THRR unchanged     Progression   Progression -- Continue to progress workloads to maintain intensity without signs/symptoms of physical distress. Continue to progress workloads to maintain intensity without signs/symptoms of physical distress. Continue to progress workloads to maintain intensity without signs/symptoms of physical distress. Continue to progress workloads to maintain intensity without signs/symptoms of physical distress.   Average METs -- 2.6 2.49 2.49 2.56     Resistance Training   Training Prescription -- Yes Yes Yes Yes   Weight -- 3 lb 3 lb 3 lb 5 lb   Reps -- 10-15 10-15 10-15 10-15     Interval Training   Interval Training -- No No No No     Recumbant Bike   Level -- 3 3 3 5    Watts -- 22 23 23  37   Minutes -- 15 2.77 2.77 2.75     REL-XR   Level -- 1 -- -- --   Minutes -- 15 -- -- --   METs -- 4.1 -- -- --     T5 Nustep   Level -- 2 4 4 3    Minutes -- 15 30 30 15    METs -- 1.9 2.2 2.2 2.7  Biostep-RELP   Level -- -- 3 3 3    Minutes -- -- 15 15 15    METs -- -- 3 3 2      Track   Laps -- 29 19 19 30    Minutes -- 15 15 15 15    METs -- 2.58 2.03 2.03 2.63     Home Exercise Plan   Plans to continue exercise at -- -- -- Home (comment)  dumbbells at home, walking at least a mile at the park or neighborhood, and going to the gym for aerobic exercise on a machine Home (comment)  dumbbells at home, walking at least a mile at the park or neighborhood, and going to the gym for aerobic exercise on a machine   Frequency -- -- -- Add 2 additional days to program exercise sessions. Add 2 additional days to program exercise sessions.   Initial Home Exercises Provided -- -- -- 04/24/23 04/24/23     Oxygen   Maintain Oxygen  Saturation -- 88% or higher 88% or higher 88% or higher 88% or higher            Exercise Comments:   Exercise Comments     Row Name 03/15/23 1051           Exercise Comments No 2 week review as he ahs not started his sessions.  Waiting for echo to be completed for AAA BP parameters to be provided to [place].                Exercise Goals and Review:   Exercise Goals     Row Name 02/24/23 1728             Exercise Goals   Increase Physical Activity Yes       Intervention Provide advice, education, support and counseling about physical activity/exercise needs.;Develop an individualized exercise prescription for aerobic and resistive training based on initial evaluation findings, risk stratification, comorbidities and participant's personal goals.       Expected Outcomes Short Term: Attend rehab on a regular basis to increase amount of physical activity.;Long Term: Exercising regularly at least 3-5 days a week.       Increase Strength and Stamina Yes       Intervention Provide advice, education, support and counseling about physical activity/exercise needs.;Develop an individualized exercise prescription for aerobic and resistive training based on initial evaluation findings, risk stratification, comorbidities and participant's personal goals.       Expected Outcomes Short Term: Increase workloads from initial exercise prescription for resistance, speed, and METs.;Short Term: Perform resistance training exercises routinely during rehab and add in resistance training at home;Long Term: Improve cardiorespiratory fitness, muscular endurance and strength as measured by increased METs and functional capacity ( )       Able to understand and use rate of perceived exertion (RPE) scale Yes       Intervention Provide education and explanation on how to use RPE scale       Expected Outcomes Short Term: Able to use RPE daily in rehab to express subjective intensity level;Long Term:  Able  to use RPE to guide intensity level when exercising independently       Able to understand and use Dyspnea scale Yes       Intervention Provide education and explanation on how to use Dyspnea scale       Expected Outcomes Short Term: Able to use Dyspnea scale daily in rehab to express subjective sense of shortness of breath during exertion;Long Term: Able  to use Dyspnea scale to guide intensity level when exercising independently       Knowledge and understanding of Target Heart Rate Range (THRR) Yes       Intervention Provide education and explanation of THRR including how the numbers were predicted and where they are located for reference       Expected Outcomes Short Term: Able to state/look up THRR;Long Term: Able to use THRR to govern intensity when exercising independently;Short Term: Able to use daily as guideline for intensity in rehab       Able to check pulse independently Yes       Intervention Provide education and demonstration on how to check pulse in carotid and radial arteries.;Review the importance of being able to check your own pulse for safety during independent exercise       Expected Outcomes Short Term: Able to explain why pulse checking is important during independent exercise;Long Term: Able to check pulse independently and accurately       Understanding of Exercise Prescription Yes       Intervention Provide education, explanation, and written materials on patient's individual exercise prescription       Expected Outcomes Short Term: Able to explain program exercise prescription;Long Term: Able to explain home exercise prescription to exercise independently                Exercise Goals Re-Evaluation :  Exercise Goals Re-Evaluation     Row Name 03/24/23 1350 04/09/23 1001 04/22/23 0732 04/24/23 1512 05/06/23 1331     Exercise Goal Re-Evaluation   Exercise Goals Review Increase Physical Activity;Able to understand and use rate of perceived exertion (RPE)  scale;Knowledge and understanding of Target Heart Rate Range (THRR);Understanding of Exercise Prescription;Increase Strength and Stamina;Able to check pulse independently Increase Physical Activity;Increase Strength and Stamina;Understanding of Exercise Prescription Increase Physical Activity;Increase Strength and Stamina;Understanding of Exercise Prescription Increase Physical Activity;Able to understand and use Dyspnea scale;Understanding of Exercise Prescription;Increase Strength and Stamina;Knowledge and understanding of Target Heart Rate Range (THRR);Able to check pulse independently;Able to understand and use rate of perceived exertion (RPE) scale Increase Physical Activity;Increase Strength and Stamina;Understanding of Exercise Prescription   Comments Reviewed RPE  and dyspnea scale, THR and program prescription with pt today.  Pt voiced understanding and was given a copy of goals to take home. Krista is off to a good start in the program. He did well on the recumbent bike at level 3, the T5 nustep at level 2, and the XR at level 1. He also got up to 29 laps on the track. We will continue to monitor his progress in the program. Amier is doing well in the program. He recently increased to level 4 on the T5 nustep and level 3 on the biostep. He also continues to walk the track, although his laps did decrease from 29 laps to 19 laps since the last review. We will continue to monitor his progress in the program. Reviewed home exercise with Roe Coombs today.  Pt plans to add 2 additional days of exerice and use dumbbells at home for resistance training, walk at least a mile around the neigborhood or at the park, and go to the gym for aerobic exercise on a machine for exercise.  Reviewed THR, pulse, RPE, sign and symptoms, pulse oximetery and when to call 911 or MD.  Also discussed weather considerations and indoor options.  Pt voiced understanding. Aizen is doing well in the program. He recently increased his number of laps  walked  on the track up to 30 laps. He also improved to level 5 on the recumbent bike and increased from 3 lb to 5 lb hand weights for resistance training. He also has stayed consistent at level 3 on the biostep. We will continue to monitor his progress in the program.   Expected Outcomes Short: Use RPE daily to regulate intensity.  Long: Follow program prescription in THR. Short: Continue to follow current exercise prescription. Long: Continue exercise to improve strength and stamina. Short: Increase laps back up on the track. Long: Continue exercise to improve strength and stamina. Short: Add 2 additional days of home exercise of both aerobic and resistance training. Long: Continue exercise to improve strength and stamina. Short: Try level 4 on the biostep. Long: Continue to increase overall METs and stamina.            Discharge Exercise Prescription (Final Exercise Prescription Changes):  Exercise Prescription Changes - 05/06/23 1300       Response to Exercise   Blood Pressure (Admit) 102/60    Blood Pressure (Exit) 118/66    Heart Rate (Admit) 50 bpm    Heart Rate (Exercise) 114 bpm    Heart Rate (Exit) 73 bpm    Rating of Perceived Exertion (Exercise) 14    Symptoms none    Duration Progress to 30 minutes of  aerobic without signs/symptoms of physical distress    Intensity THRR unchanged      Progression   Progression Continue to progress workloads to maintain intensity without signs/symptoms of physical distress.    Average METs 2.56      Resistance Training   Training Prescription Yes    Weight 5 lb    Reps 10-15      Interval Training   Interval Training No      Recumbant Bike   Level 5    Watts 37    Minutes 2.75      T5 Nustep   Level 3    Minutes 15    METs 2.7      Biostep-RELP   Level 3    Minutes 15    METs 2      Track   Laps 30    Minutes 15    METs 2.63      Home Exercise Plan   Plans to continue exercise at Home (comment)   dumbbells at home,  walking at least a mile at the park or neighborhood, and going to the gym for aerobic exercise on a machine   Frequency Add 2 additional days to program exercise sessions.    Initial Home Exercises Provided 04/24/23      Oxygen   Maintain Oxygen Saturation 88% or higher             Nutrition:  Target Goals: Understanding of nutrition guidelines, daily intake of sodium 1500mg , cholesterol 200mg , calories 30% from fat and 7% or less from saturated fats, daily to have 5 or more servings of fruits and vegetables.  Education: All About Nutrition: -Group instruction provided by verbal, written material, interactive activities, discussions, models, and posters to present general guidelines for heart healthy nutrition including fat, fiber, MyPlate, the role of sodium in heart healthy nutrition, utilization of the nutrition label, and utilization of this knowledge for meal planning. Follow up email sent as well. Written material given at graduation.   Biometrics:  Pre Biometrics - 02/24/23 1729       Pre Biometrics   Height 5' 10.5" (1.791 m)  Weight 210 lb 8 oz (95.5 kg)    Waist Circumference 40.5 inches    Hip Circumference 44.5 inches    Waist to Hip Ratio 0.91 %    BMI (Calculated) 29.77    Single Leg Stand 3.16 seconds              Nutrition Therapy Plan and Nutrition Goals:  Nutrition Therapy & Goals - 02/24/23 1730       Intervention Plan   Intervention Prescribe, educate and counsel regarding individualized specific dietary modifications aiming towards targeted core components such as weight, hypertension, lipid management, diabetes, heart failure and other comorbidities.    Expected Outcomes Short Term Goal: Understand basic principles of dietary content, such as calories, fat, sodium, cholesterol and nutrients.;Short Term Goal: A plan has been developed with personal nutrition goals set during dietitian appointment.;Long Term Goal: Adherence to prescribed nutrition  plan.             Nutrition Assessments:  MEDIFICTS Score Key: ?70 Need to make dietary changes  40-70 Heart Healthy Diet ? 40 Therapeutic Level Cholesterol Diet   Picture Your Plate Scores: <16 Unhealthy dietary pattern with much room for improvement. 41-50 Dietary pattern unlikely to meet recommendations for good health and room for improvement. 51-60 More healthful dietary pattern, with some room for improvement.  >60 Healthy dietary pattern, although there may be some specific behaviors that could be improved.    Nutrition Goals Re-Evaluation:   Nutrition Goals Discharge (Final Nutrition Goals Re-Evaluation):   Psychosocial: Target Goals: Acknowledge presence or absence of significant depression and/or stress, maximize coping skills, provide positive support system. Participant is able to verbalize types and ability to use techniques and skills needed for reducing stress and depression.   Education: Stress, Anxiety, and Depression - Group verbal and visual presentation to define topics covered.  Reviews how body is impacted by stress, anxiety, and depression.  Also discusses healthy ways to reduce stress and to treat/manage anxiety and depression.  Written material given at graduation.   Education: Sleep Hygiene -Provides group verbal and written instruction about how sleep can affect your health.  Define sleep hygiene, discuss sleep cycles and impact of sleep habits. Review good sleep hygiene tips.    Initial Review & Psychosocial Screening:  Initial Psych Review & Screening - 02/17/23 1607       Initial Review   Current issues with Current Depression   [facility]     Family Dynamics   Good Support System? Yes   pt's wife     Screening Interventions   Expected Outcomes Short Term goal: Utilizing psychosocial counselor, staff and physician to assist with identification of specific Stressors or current issues interfering with healing  process. Setting desired goal for each stressor or current issue identified.;Long Term Goal: Stressors or current issues are controlled or eliminated.;Short Term goal: Identification and review with participant of any Quality of Life or Depression concerns found by scoring the questionnaire.;Long Term goal: The participant improves quality of Life and PHQ9 Scores as seen by post scores and/or verbalization of changes             Quality of Life Scores:   Quality of Life - 02/24/23 1740       Quality of Life   Select Quality of Life      Quality of Life Scores   Health/Function Pre 19.63 %    Socioeconomic Pre 24.42 %    Psych/Spiritual Pre 26.79 %  Family Pre 23.4 %    GLOBAL Pre 22.59 %            Scores of 19 and below usually indicate a poorer quality of life in these areas.  A difference of  2-3 points is a clinically meaningful difference.  A difference of 2-3 points in the total score of the Quality of Life Index has been associated with significant improvement in overall quality of life, self-image, physical symptoms, and general health in studies assessing change in quality of life.  PHQ-9: Review Flowsheet       02/24/2023  Depression screen PHQ 2/9  Decreased Interest 3  Down, Depressed, Hopeless 3  PHQ - 2 Score 6  Altered sleeping 0  Tired, decreased energy 3  Change in appetite 2  Feeling bad or failure about yourself  3  Trouble concentrating 2  Moving slowly or fidgety/restless 3  Suicidal thoughts 0  PHQ-9 Score 19  Difficult doing work/chores Not difficult at all    Details           Interpretation of Total Score  Total Score Depression Severity:  1-4 = Minimal depression, 5-9 = Mild depression, 10-14 = Moderate depression, 15-19 = Moderately severe depression, 20-27 = Severe depression   Psychosocial Evaluation and Intervention:  Psychosocial Evaluation - 02/17/23 1613       Psychosocial Evaluation & Interventions   Comments Ayham is  coming to cardiac rehab post stent placement on 01/16/23. He has no barriers for attending the program. Spoke with Toddrick and his wife, Corrie Dandy, who appears to show great support. Breylan's medical history also includes: aortic root dilatation (4.5cm), CVA, atrial fibrillation, h/o pulmonary embolism, HTN, HLD and valve insufficiency. Kham and his wife are aware that we will fax blood pressure parameter form to referring provider and both voice understanding that we will need to receive signed form prior to starting exercise. Reiss has weight loss goals, as well as would like to build strength back while in rehab. 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 Corrin is hopeful to be able to get back to the gym after he completes rehab. Jamal does report some balance concern after his stroke, but does not use any assistive devices at this time. 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 Kyo does have some concern with depression, and [facility] is [facility]. Oluwadamilola is looking forward to starting rehab and has no further questions at this time.    Expected Outcomes Short: Attend cardiac rehab for education and exercise. Long: Develop and maintain positive self care habits.    Continue Psychosocial Services  Follow up required by staff             Psychosocial Re-Evaluation:   Psychosocial Discharge (Final Psychosocial Re-Evaluation):   Vocational Rehabilitation: Provide vocational rehab assistance to qualifying candidates.   Vocational Rehab Evaluation & Intervention:  Vocational Rehab - 02/17/23 1607       Initial Vocational Rehab Evaluation & Intervention   Assessment shows need for Vocational Rehabilitation No             Education: Education Goals: Education classes will be provided on a variety of topics geared toward better understanding of heart health and risk factor modification. Participant will state understanding/return demonstration  of topics presented as noted by education test scores.  Learning Barriers/Preferences:  Learning Barriers/Preferences - 02/17/23 1553       Learning  Barriers/Preferences   Learning Barriers None    Learning Preferences None             General Cardiac Education Topics:  AED/CPR: - Group verbal and written instruction with the use of models to demonstrate the basic use of the AED with the basic ABC's of resuscitation.   Anatomy and Cardiac Procedures: - Group verbal and visual presentation and models provide information about basic cardiac anatomy and function. Reviews the testing methods done to diagnose heart disease and the outcomes of the test results. Describes the treatment choices: Medical Management, Angioplasty, or Coronary Bypass Surgery for treating various heart conditions including Myocardial Infarction, Angina, Valve Disease, and Cardiac Arrhythmias.  Written material given at graduation.   Medication Safety: - Group verbal and visual instruction to review commonly prescribed medications for heart and lung disease. Reviews the medication, class of the drug, and side effects. Includes the steps to properly store meds and maintain the prescription regimen.  Written material given at graduation.   Intimacy: - Group verbal instruction through game format to discuss how heart and lung disease can affect sexual intimacy. Written material given at graduation..   Know Your Numbers and Heart Failure: - Group verbal and visual instruction to discuss disease risk factors for cardiac and pulmonary disease and treatment options.  Reviews associated critical values for Overweight/Obesity, Hypertension, Cholesterol, and Diabetes.  Discusses basics of heart failure: signs/symptoms and treatments.  Introduces Heart Failure Zone chart for action plan for heart failure.  Written material given at graduation.   Infection Prevention: - Provides verbal and written material to individual  with discussion of infection control including proper hand washing and proper equipment cleaning during exercise session. Flowsheet Row Cardiac Rehab from 02/24/2023 in [facility] Cardiac and Pulmonary Rehab  Date 02/24/23  Educator [facility]  Instruction Review Code 1- Verbalizes Understanding       Falls Prevention: - Provides verbal and written material to individual with discussion of falls prevention and safety. Flowsheet Row Cardiac Rehab from 02/24/2023 in [facility] Cardiac and Pulmonary Rehab  Date 02/24/23  Educator [facility]  Instruction Review Code 1- Verbalizes Understanding       Other: -Provides group and verbal instruction on various topics (see comments)   Knowledge Questionnaire Score:   Core Components/Risk Factors/Patient Goals at Admission:  Personal Goals and Risk Factors at Admission - 02/24/23 1729       Core Components/Risk Factors/Patient Goals on Admission    Weight Management Yes    Intervention Weight Management: Develop a combined nutrition and exercise program designed to reach desired caloric intake, while maintaining appropriate intake of nutrient and fiber, sodium and fats, and appropriate energy expenditure required for the weight goal.;Weight Management: Provide education and appropriate resources to help participant work on and attain dietary goals.;Weight Management/Obesity: Establish reasonable short term and long term weight goals.    Admit Weight 220 lb (99.8 kg)    Goal Weight: Short Term 215 lb (97.5 kg)    Goal Weight: Long Term 200 lb (90.7 kg)    Expected Outcomes Short Term: Continue to assess and modify interventions until short term weight is achieved;Long Term: Adherence to nutrition and physical activity/exercise program aimed toward attainment of established weight goal;Weight Maintenance: Understanding of the daily nutrition guidelines, which includes 25-35% calories from fat, 7% or less cal from saturated fats, less than 200mg  cholesterol, less than 1.5gm  of sodium, & 5 or more servings of fruits and vegetables daily;Weight Loss: Understanding of general recommendations for a  balanced deficit meal plan, which promotes 1-2 lb weight loss per week and includes a negative energy balance of [phone] kcal/d;Understanding recommendations for meals to include 15-35% energy as protein, 25-35% energy from fat, 35-60% energy from carbohydrates, less than 200mg  of dietary cholesterol, 20-35 gm of total fiber daily;Understanding of distribution of calorie intake throughout the day with the consumption of 4-5 meals/snacks    Hypertension Yes    Intervention Provide education on lifestyle modifcations including regular physical activity/exercise, weight management, moderate sodium restriction and increased consumption of fresh fruit, vegetables, and low fat dairy, alcohol moderation, and smoking cessation.;Monitor prescription use compliance.    Expected Outcomes Short Term: Continued assessment and intervention until BP is < 140/42mm HG in hypertensive participants. < 130/24mm HG in hypertensive participants with diabetes, heart failure or chronic kidney disease.;Long Term: Maintenance of blood pressure at goal levels.    Lipids Yes    Intervention Provide education and support for participant on nutrition & aerobic/resistive exercise along with prescribed medications to achieve LDL 70mg , HDL >40mg .    Expected Outcomes Short Term: Participant states understanding of desired cholesterol values and is compliant with medications prescribed. Participant is following exercise prescription and nutrition guidelines.;Long Term: Cholesterol controlled with medications as prescribed, with individualized exercise RX and with personalized nutrition plan. Value goals: LDL < 70mg , HDL > 40 mg.             Education:Diabetes - Individual verbal and written instruction to review signs/symptoms of diabetes, desired ranges of glucose level fasting, after meals and with exercise.  Acknowledge that pre and post exercise glucose checks will be done for 3 sessions at entry of program.   Core Components/Risk Factors/Patient Goals Review:    Core Components/Risk Factors/Patient Goals at Discharge (Final Review):    ITP Comments:  ITP Comments     Row Name 02/17/23 1602 02/24/23 1715 03/11/23 1349 03/24/23 1349 03/26/23 1137   ITP Comments Initial phone call completed. Dx can be found in media scan dated 02/24/23. EP orientation scheduled for 6/24 at 3:00 pm. Completed and gym orientation. Initial ITP created and sent for review to Dr. Bethann Punches, Medical Director. 30 Day review completed. Medical Director ITP review done, changes made as directed, and signed approval by Medical Director.   New to program First full day of exercise!  Patient was oriented to gym and equipment including functions, settings, policies, and procedures.  Patient's individual exercise prescription and treatment plan were reviewed.  All starting workloads were established based on the results of the 6 minute walk test done at initial orientation visit.  The plan for exercise progression was also introduced and progression will be customized based on patient's performance and goals. Powell had his initial Eval on 6/24. first session was 7/22. No exercise notes until he has attended sessions    Row Name 04/09/23 1108 05/07/23 0910         ITP Comments 30 Day review completed. Medical Director ITP review done, changes made as directed, and signed approval by Medical Director.   new to program 30 Day review completed. Medical Director ITP review done, changes made as directed, and signed approval by Medical Director.               Comments:

## 2023-05-07 NOTE — Progress Notes (Signed)
Cardiac Individual Treatment Plan  Patient Details  Name: Geoffrey Wells MRN: [id number] Date of Birth: 1946-05-22 Referring Provider:   Flowsheet Row Cardiac Rehab from 02/24/2023 in [facility] Cardiac and Pulmonary Rehab  Referring Provider [name]       Initial Encounter Date:  Flowsheet Row Cardiac Rehab from 02/24/2023 in [facility] Cardiac and Pulmonary Rehab  Date 02/24/23       Visit Diagnosis: Status post coronary artery stent placement  Patient's Home Medications on Admission:  Current Outpatient Medications:    acetaminophen (TYLENOL) 325 MG tablet, Take 1-2 tablets (325-650 mg total) by mouth every 4 (four) hours as needed for mild pain., Disp: , Rfl:    apixaban (ELIQUIS) 5 MG TABS tablet, Take 5 mg by mouth 2 (two) times daily., Disp: , Rfl:    b complex vitamins capsule, Take 1 capsule by mouth daily., Disp: , Rfl:    clopidogrel (PLAVIX) 75 MG tablet, Take 75 mg by mouth daily., Disp: , Rfl:    Coenzyme Q10 (CO Q10) 100 MG CAPS, Take 1 capsule by mouth daily., Disp: , Rfl:    Magnesium 500 MG TABS, Take 1 tablet (500 mg total) by mouth daily., Disp: 30 tablet, Rfl: 0   metoprolol succinate (TOPROL-XL) 50 MG 24 hr tablet, Take 25 mg by mouth daily., Disp: , Rfl:    Nutritional Supplements (JUICE PLUS FIBRE PO), Take 3 capsules by mouth in the morning and at bedtime., Disp: , Rfl:    omega-3 acid ethyl esters (LOVAZA) 1 g capsule, Take by mouth 2 (two) times daily., Disp: , Rfl:    rosuvastatin (CRESTOR) 20 MG tablet, Take 20 mg by mouth daily., Disp: , Rfl:    Vitamin D-Vitamin K (VITAMIN D2 + K1 PO), Take by mouth., Disp: , Rfl:   Past Medical History: Past Medical History:  Diagnosis Date   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    fatigue and "foggy brain"   Pneumonia due to COVID-19 virus 01/10/2020   hospitalization X 2   Pulmonary emboli (HCC)    post Covid   Throat cancer (HCC)    Radiotactic surgery    Tobacco Use: Social History    Tobacco Use  Smoking Status Former   Current packs/day: 0.50   Average packs/day: 0.5 packs/day for 25.0 years (12.5 ttl pk-yrs)   Types: Cigarettes  Smokeless Tobacco Never    Labs: Review Flowsheet       Latest Ref Rng & Units 01/19/2020 03/30/2021  Labs for ITP Cardiac and Pulmonary Rehab  Cholestrol 0 - 200 mg/dL - 409   LDL (calc) 0 - 99 mg/dL - 811   HDL-C >91 mg/dL - 67   Trlycerides <478 mg/dL - 89   Hemoglobin G9F 4.8 - 5.6 % - 5.8   Bicarbonate 20.0 - 28.0 mmol/L 24.1  -  Acid-base deficit 0.0 - 2.0 mmol/L 0.3  -  O2 Saturation % 69.7  -    Details             Exercise Target Goals: Exercise Program Goal: Individual exercise prescription set using results from initial 6 min walk test and THRR while considering  patient's activity barriers and safety.   Exercise Prescription Goal: Initial exercise prescription builds to 30-45 minutes a day of aerobic activity, 2-3 days per week.  Home exercise guidelines will be given to patient during program as part of exercise prescription that the participant will acknowledge.   Education: Aerobic Exercise: - Group  verbal and visual presentation on the components of exercise prescription. Introduces F.I.T.T principle from ACSM for exercise prescriptions.  Reviews F.I.T.T. principles of aerobic exercise including progression. Written material given at graduation.   Education: Resistance Exercise: - Group verbal and visual presentation on the components of exercise prescription. Introduces F.I.T.T principle from ACSM for exercise prescriptions  Reviews F.I.T.T. principles of resistance exercise including progression. Written material given at graduation.    Education: Exercise & Equipment Safety: - Individual verbal instruction and demonstration of equipment use and safety with use of the equipment. Flowsheet Row Cardiac Rehab from 02/24/2023 in [facility] Cardiac and Pulmonary Rehab  Date 02/24/23  Educator [facility]  Instruction  Review Code 1- Verbalizes Understanding       Education: Exercise Physiology & General Exercise Guidelines: - Group verbal and written instruction with models to review the exercise physiology of the cardiovascular system and associated critical values. Provides general exercise guidelines with specific guidelines to those with heart or lung disease.    Education: Flexibility, Balance, Mind/Body Relaxation: - Group verbal and visual presentation with interactive activity on the components of exercise prescription. Introduces F.I.T.T principle from ACSM for exercise prescriptions. Reviews F.I.T.T. principles of flexibility and balance exercise training including progression. Also discusses the mind body connection.  Reviews various relaxation techniques to help reduce and manage stress (i.e. Deep breathing, progressive muscle relaxation, and visualization). Balance handout provided to take home. Written material given at graduation.   Activity Barriers & Risk Stratification:  Activity Barriers & Cardiac Risk Stratification - 02/24/23 1725       Activity Barriers & Cardiac Risk Stratification   Activity Barriers Balance Concerns    Cardiac Risk Stratification High             6 Minute Walk:  6 Minute Walk     Row Name 02/24/23 1723 05/07/23 1355       6 Minute Walk   Phase Initial Discharge    Distance 1200 feet 1400 feet    Distance % Change -- 16.7 %    Distance Feet Change -- 200 ft    Walk Time 6 minutes 6 minutes    # of Rest Breaks 0 0    MPH 2.27 2.65    METS 2.3 2.79    RPE 11 11    Perceived Dyspnea  1 0    VO2 Peak 8.05 9.76    Symptoms Yes (comment) No    Comments slight dizziness --    Resting HR 61 bpm 53 bpm    Resting BP 122/70 130/66    Resting Oxygen Saturation  95 % 97 %    Exercise Oxygen Saturation  during 6 min walk 95 % 97 %    Max Ex. HR 89 bpm 100 bpm    Max Ex. BP 138/80 144/72    2 Minute Post BP 128/78 --             Oxygen Initial  Assessment:   Oxygen Re-Evaluation:   Oxygen Discharge (Final Oxygen Re-Evaluation):   Initial Exercise Prescription:  Initial Exercise Prescription - 02/24/23 1700       Date of Initial Exercise RX and Referring Provider   Date 02/24/23    Referring Provider [name]      Oxygen   Maintain Oxygen Saturation 88% or higher      Recumbant Bike   Level 2    RPM 50    Watts 15    Minutes 15  NuStep   Level 2    SPM 80    Minutes 15    METs 2.3      REL-XR   Level 2    Speed 50    Minutes 15    METs 2.3      Track   Laps 20    Minutes 15    METs 2.09      Prescription Details   Frequency (times per week) 3    Duration Progress to 30 minutes of continuous aerobic without signs/symptoms of physical distress      Intensity   THRR 40-80% of Max Heartrate 94-128    Ratings of Perceived Exertion 11-13    Perceived Dyspnea 0-4      Progression   Progression Continue to progress workloads to maintain intensity without signs/symptoms of physical distress.      Resistance Training   Training Prescription Yes    Weight 3    Reps 10-15             Perform Capillary Blood Glucose checks as needed.  Exercise Prescription Changes:   Exercise Prescription Changes     Row Name 02/24/23 1700 04/09/23 0900 04/22/23 0700 04/24/23 1500 05/06/23 1300     Response to Exercise   Blood Pressure (Admit) 122/70 122/60 132/70 -- 102/60   Blood Pressure (Exercise) 138/80 164/78 152/70 -- --   Blood Pressure (Exit) 128/78 106/52 122/68 -- 118/66   Heart Rate (Admit) 61 bpm 58 bpm 66 bpm -- 50 bpm   Heart Rate (Exercise) 89 bpm 103 bpm 145 bpm -- 114 bpm   Heart Rate (Exit) 65 bpm 56 bpm 75 bpm -- 73 bpm   Oxygen Saturation (Admit) 95 % -- -- -- --   Oxygen Saturation (Exercise) 95 % -- -- -- --   Oxygen Saturation (Exit) 96 % -- -- -- --   Rating of Perceived Exertion (Exercise) 11 15 14  -- 14   Perceived Dyspnea (Exercise) 1 -- -- -- --   Symptoms  slight dizziness none none none none   Comments 6 MWT results First two weeks of exercise -- -- --   Duration -- Progress to 30 minutes of  aerobic without signs/symptoms of physical distress Progress to 30 minutes of  aerobic without signs/symptoms of physical distress -- Progress to 30 minutes of  aerobic without signs/symptoms of physical distress   Intensity -- THRR unchanged THRR unchanged -- THRR unchanged     Progression   Progression -- Continue to progress workloads to maintain intensity without signs/symptoms of physical distress. Continue to progress workloads to maintain intensity without signs/symptoms of physical distress. Continue to progress workloads to maintain intensity without signs/symptoms of physical distress. Continue to progress workloads to maintain intensity without signs/symptoms of physical distress.   Average METs -- 2.6 2.49 2.49 2.56     Resistance Training   Training Prescription -- Yes Yes Yes Yes   Weight -- 3 lb 3 lb 3 lb 5 lb   Reps -- 10-15 10-15 10-15 10-15     Interval Training   Interval Training -- No No No No     Recumbant Bike   Level -- 3 3 3 5    Watts -- 22 23 23  37   Minutes -- 15 2.77 2.77 2.75     REL-XR   Level -- 1 -- -- --   Minutes -- 15 -- -- --   METs -- 4.1 -- -- --     T5  Nustep   Level -- 2 4 4 3    Minutes -- 15 30 30 15    METs -- 1.9 2.2 2.2 2.7     Biostep-RELP   Level -- -- 3 3 3    Minutes -- -- 15 15 15    METs -- -- 3 3 2      Track   Laps -- 29 19 19 30    Minutes -- 15 15 15 15    METs -- 2.58 2.03 2.03 2.63     Home Exercise Plan   Plans to continue exercise at -- -- -- Home (comment)  dumbbells at home, walking at least a mile at the park or neighborhood, and going to the gym for aerobic exercise on a machine Home (comment)  dumbbells at home, walking at least a mile at the park or neighborhood, and going to the gym for aerobic exercise on a machine   Frequency -- -- -- Add 2 additional days to program exercise  sessions. Add 2 additional days to program exercise sessions.   Initial Home Exercises Provided -- -- -- 04/24/23 04/24/23     Oxygen   Maintain Oxygen Saturation -- 88% or higher 88% or higher 88% or higher 88% or higher            Exercise Comments:   Exercise Comments     Row Name 03/15/23 1051           Exercise Comments No 2 week review as he ahs not started his sessions.  Waiting for echo to be completed for AAA BP parameters to be provided to [place].                Exercise Goals and Review:   Exercise Goals     Row Name 02/24/23 1728             Exercise Goals   Increase Physical Activity Yes       Intervention Provide advice, education, support and counseling about physical activity/exercise needs.;Develop an individualized exercise prescription for aerobic and resistive training based on initial evaluation findings, risk stratification, comorbidities and participant's personal goals.       Expected Outcomes Short Term: Attend rehab on a regular basis to increase amount of physical activity.;Long Term: Exercising regularly at least 3-5 days a week.       Increase Strength and Stamina Yes       Intervention Provide advice, education, support and counseling about physical activity/exercise needs.;Develop an individualized exercise prescription for aerobic and resistive training based on initial evaluation findings, risk stratification, comorbidities and participant's personal goals.       Expected Outcomes Short Term: Increase workloads from initial exercise prescription for resistance, speed, and METs.;Short Term: Perform resistance training exercises routinely during rehab and add in resistance training at home;Long Term: Improve cardiorespiratory fitness, muscular endurance and strength as measured by increased METs and functional capacity ( )       Able to understand and use rate of perceived exertion (RPE) scale Yes       Intervention Provide education and  explanation on how to use RPE scale       Expected Outcomes Short Term: Able to use RPE daily in rehab to express subjective intensity level;Long Term:  Able to use RPE to guide intensity level when exercising independently       Able to understand and use Dyspnea scale Yes       Intervention Provide education and explanation on how to use Dyspnea scale  Expected Outcomes Short Term: Able to use Dyspnea scale daily in rehab to express subjective sense of shortness of breath during exertion;Long Term: Able to use Dyspnea scale to guide intensity level when exercising independently       Knowledge and understanding of Target Heart Rate Range (THRR) Yes       Intervention Provide education and explanation of THRR including how the numbers were predicted and where they are located for reference       Expected Outcomes Short Term: Able to state/look up THRR;Long Term: Able to use THRR to govern intensity when exercising independently;Short Term: Able to use daily as guideline for intensity in rehab       Able to check pulse independently Yes       Intervention Provide education and demonstration on how to check pulse in carotid and radial arteries.;Review the importance of being able to check your own pulse for safety during independent exercise       Expected Outcomes Short Term: Able to explain why pulse checking is important during independent exercise;Long Term: Able to check pulse independently and accurately       Understanding of Exercise Prescription Yes       Intervention Provide education, explanation, and written materials on patient's individual exercise prescription       Expected Outcomes Short Term: Able to explain program exercise prescription;Long Term: Able to explain home exercise prescription to exercise independently                Exercise Goals Re-Evaluation :  Exercise Goals Re-Evaluation     Row Name 03/24/23 1350 04/09/23 1001 04/22/23 0732 04/24/23 1512 05/06/23  1331     Exercise Goal Re-Evaluation   Exercise Goals Review Increase Physical Activity;Able to understand and use rate of perceived exertion (RPE) scale;Knowledge and understanding of Target Heart Rate Range (THRR);Understanding of Exercise Prescription;Increase Strength and Stamina;Able to check pulse independently Increase Physical Activity;Increase Strength and Stamina;Understanding of Exercise Prescription Increase Physical Activity;Increase Strength and Stamina;Understanding of Exercise Prescription Increase Physical Activity;Able to understand and use Dyspnea scale;Understanding of Exercise Prescription;Increase Strength and Stamina;Knowledge and understanding of Target Heart Rate Range (THRR);Able to check pulse independently;Able to understand and use rate of perceived exertion (RPE) scale Increase Physical Activity;Increase Strength and Stamina;Understanding of Exercise Prescription   Comments Reviewed RPE  and dyspnea scale, THR and program prescription with pt today.  Pt voiced understanding and was given a copy of goals to take home. Allyn is off to a good start in the program. He did well on the recumbent bike at level 3, the T5 nustep at level 2, and the XR at level 1. He also got up to 29 laps on the track. We will continue to monitor his progress in the program. Slate is doing well in the program. He recently increased to level 4 on the T5 nustep and level 3 on the biostep. He also continues to walk the track, although his laps did decrease from 29 laps to 19 laps since the last review. We will continue to monitor his progress in the program. Reviewed home exercise with Roe Coombs today.  Pt plans to add 2 additional days of exerice and use dumbbells at home for resistance training, walk at least a mile around the neigborhood or at the park, and go to the gym for aerobic exercise on a machine for exercise.  Reviewed THR, pulse, RPE, sign and symptoms, pulse oximetery and when to call 911 or MD.  Also  discussed  weather considerations and indoor options.  Pt voiced understanding. Naveed is doing well in the program. He recently increased his number of laps walked on the track up to 30 laps. He also improved to level 5 on the recumbent bike and increased from 3 lb to 5 lb hand weights for resistance training. He also has stayed consistent at level 3 on the biostep. We will continue to monitor his progress in the program.   Expected Outcomes Short: Use RPE daily to regulate intensity.  Long: Follow program prescription in THR. Short: Continue to follow current exercise prescription. Long: Continue exercise to improve strength and stamina. Short: Increase laps back up on the track. Long: Continue exercise to improve strength and stamina. Short: Add 2 additional days of home exercise of both aerobic and resistance training. Long: Continue exercise to improve strength and stamina. Short: Try level 4 on the biostep. Long: Continue to increase overall METs and stamina.            Discharge Exercise Prescription (Final Exercise Prescription Changes):  Exercise Prescription Changes - 05/06/23 1300       Response to Exercise   Blood Pressure (Admit) 102/60    Blood Pressure (Exit) 118/66    Heart Rate (Admit) 50 bpm    Heart Rate (Exercise) 114 bpm    Heart Rate (Exit) 73 bpm    Rating of Perceived Exertion (Exercise) 14    Symptoms none    Duration Progress to 30 minutes of  aerobic without signs/symptoms of physical distress    Intensity THRR unchanged      Progression   Progression Continue to progress workloads to maintain intensity without signs/symptoms of physical distress.    Average METs 2.56      Resistance Training   Training Prescription Yes    Weight 5 lb    Reps 10-15      Interval Training   Interval Training No      Recumbant Bike   Level 5    Watts 37    Minutes 2.75      T5 Nustep   Level 3    Minutes 15    METs 2.7      Biostep-RELP   Level 3    Minutes 15     METs 2      Track   Laps 30    Minutes 15    METs 2.63      Home Exercise Plan   Plans to continue exercise at Home (comment)   dumbbells at home, walking at least a mile at the park or neighborhood, and going to the gym for aerobic exercise on a machine   Frequency Add 2 additional days to program exercise sessions.    Initial Home Exercises Provided 04/24/23      Oxygen   Maintain Oxygen Saturation 88% or higher             Nutrition:  Target Goals: Understanding of nutrition guidelines, daily intake of sodium 1500mg , cholesterol 200mg , calories 30% from fat and 7% or less from saturated fats, daily to have 5 or more servings of fruits and vegetables.  Education: All About Nutrition: -Group instruction provided by verbal, written material, interactive activities, discussions, models, and posters to present general guidelines for heart healthy nutrition including fat, fiber, MyPlate, the role of sodium in heart healthy nutrition, utilization of the nutrition label, and utilization of this knowledge for meal planning. Follow up email sent as well. Written material given at graduation.  Biometrics:  Pre Biometrics - 02/24/23 1729       Pre Biometrics   Height 5' 10.5" (1.791 m)    Weight 210 lb 8 oz (95.5 kg)    Waist Circumference 40.5 inches    Hip Circumference 44.5 inches    Waist to Hip Ratio 0.91 %    BMI (Calculated) 29.77    Single Leg Stand 3.16 seconds             Post Biometrics - 05/07/23 1356        Post  Biometrics   Height 5' 10.5" (1.791 m)    Weight 212 lb 6.4 oz (96.3 kg)    Waist Circumference 39 inches    Hip Circumference 41 inches    Waist to Hip Ratio 0.95 %    BMI (Calculated) 30.04    Single Leg Stand 5.4 seconds             Nutrition Therapy Plan and Nutrition Goals:  Nutrition Therapy & Goals - 02/24/23 1730       Intervention Plan   Intervention Prescribe, educate and counsel regarding individualized specific dietary  modifications aiming towards targeted core components such as weight, hypertension, lipid management, diabetes, heart failure and other comorbidities.    Expected Outcomes Short Term Goal: Understand basic principles of dietary content, such as calories, fat, sodium, cholesterol and nutrients.;Short Term Goal: A plan has been developed with personal nutrition goals set during dietitian appointment.;Long Term Goal: Adherence to prescribed nutrition plan.             Nutrition Assessments:  MEDIFICTS Score Key: ?70 Need to make dietary changes  40-70 Heart Healthy Diet ? 40 Therapeutic Level Cholesterol Diet   Picture Your Plate Scores: <19 Unhealthy dietary pattern with much room for improvement. 41-50 Dietary pattern unlikely to meet recommendations for good health and room for improvement. 51-60 More healthful dietary pattern, with some room for improvement.  >60 Healthy dietary pattern, although there may be some specific behaviors that could be improved.    Nutrition Goals Re-Evaluation:   Nutrition Goals Discharge (Final Nutrition Goals Re-Evaluation):   Psychosocial: Target Goals: Acknowledge presence or absence of significant depression and/or stress, maximize coping skills, provide positive support system. Participant is able to verbalize types and ability to use techniques and skills needed for reducing stress and depression.   Education: Stress, Anxiety, and Depression - Group verbal and visual presentation to define topics covered.  Reviews how body is impacted by stress, anxiety, and depression.  Also discusses healthy ways to reduce stress and to treat/manage anxiety and depression.  Written material given at graduation.   Education: Sleep Hygiene -Provides group verbal and written instruction about how sleep can affect your health.  Define sleep hygiene, discuss sleep cycles and impact of sleep habits. Review good sleep hygiene tips.    Initial Review &  Psychosocial Screening:  Initial Psych Review & Screening - 02/17/23 1607       Initial Review   Current issues with Current Depression   [facility]     Family Dynamics   Good Support System? Yes   pt's wife     Screening Interventions   Expected Outcomes Short Term goal: Utilizing psychosocial counselor, staff and physician to assist with identification of specific Stressors or current issues interfering with healing process. Setting desired goal for each stressor or current issue identified.;Long Term Goal: Stressors or current issues are controlled or eliminated.;Short Term goal: Identification and  review with participant of any Quality of Life or Depression concerns found by scoring the questionnaire.;Long Term goal: The participant improves quality of Life and PHQ9 Scores as seen by post scores and/or verbalization of changes             Quality of Life Scores:   Quality of Life - 02/24/23 1740       Quality of Life   Select Quality of Life      Quality of Life Scores   Health/Function Pre 19.63 %    Socioeconomic Pre 24.42 %    Psych/Spiritual Pre 26.79 %    Family Pre 23.4 %    GLOBAL Pre 22.59 %            Scores of 19 and below usually indicate a poorer quality of life in these areas.  A difference of  2-3 points is a clinically meaningful difference.  A difference of 2-3 points in the total score of the Quality of Life Index has been associated with significant improvement in overall quality of life, self-image, physical symptoms, and general health in studies assessing change in quality of life.  PHQ-9: Review Flowsheet       02/24/2023  Depression screen PHQ 2/9  Decreased Interest 3  Down, Depressed, Hopeless 3  PHQ - 2 Score 6  Altered sleeping 0  Tired, decreased energy 3  Change in appetite 2  Feeling bad or failure about yourself  3  Trouble concentrating 2  Moving slowly or fidgety/restless 3  Suicidal thoughts 0  PHQ-9  Score 19  Difficult doing work/chores Not difficult at all    Details           Interpretation of Total Score  Total Score Depression Severity:  1-4 = Minimal depression, 5-9 = Mild depression, 10-14 = Moderate depression, 15-19 = Moderately severe depression, 20-27 = Severe depression   Psychosocial Evaluation and Intervention:  Psychosocial Evaluation - 02/17/23 1613       Psychosocial Evaluation & Interventions   Comments Yahir is coming to cardiac rehab post stent placement on 01/16/23. He has no barriers for attending the program. Spoke with [place] and his wife, Corrie Dandy, who appears to show great support. Luther's medical history also includes: aortic root dilatation (4.5cm), CVA, atrial fibrillation, h/o pulmonary embolism, HTN, HLD and valve insufficiency. Asier and his wife are aware that we will fax blood pressure parameter form to referring provider and both voice understanding that we will need to receive signed form prior to starting exercise. Nasire has weight loss goals, as well as would like to build strength back while in rehab. 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 Matheu is hopeful to be able to get back to the gym after he completes rehab. Tavio does report some balance concern after his stroke, but does not use any assistive devices at this time. 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 Dequawn does have some concern with depression, and [facility]. Roniel is looking forward to starting rehab and has no further questions at this time.    Expected Outcomes Short: Attend cardiac rehab for education and exercise. Long: Develop and maintain positive self care habits.    Continue Psychosocial Services  Follow up required by staff             Psychosocial Re-Evaluation:   Psychosocial Discharge (Final Psychosocial Re-Evaluation):   Vocational Rehabilitation: Provide  vocational rehab assistance to qualifying  candidates.   Vocational Rehab Evaluation & Intervention:  Vocational Rehab - 02/17/23 1607       Initial Vocational Rehab Evaluation & Intervention   Assessment shows need for Vocational Rehabilitation No             Education: Education Goals: Education classes will be provided on a variety of topics geared toward better understanding of heart health and risk factor modification. Participant will state understanding/return demonstration of topics presented as noted by education test scores.  Learning Barriers/Preferences:  Learning Barriers/Preferences - 02/17/23 1553       Learning Barriers/Preferences   Learning Barriers None    Learning Preferences None             General Cardiac Education Topics:  AED/CPR: - Group verbal and written instruction with the use of models to demonstrate the basic use of the AED with the basic ABC's of resuscitation.   Anatomy and Cardiac Procedures: - Group verbal and visual presentation and models provide information about basic cardiac anatomy and function. Reviews the testing methods done to diagnose heart disease and the outcomes of the test results. Describes the treatment choices: Medical Management, Angioplasty, or Coronary Bypass Surgery for treating various heart conditions including Myocardial Infarction, Angina, Valve Disease, and Cardiac Arrhythmias.  Written material given at graduation.   Medication Safety: - Group verbal and visual instruction to review commonly prescribed medications for heart and lung disease. Reviews the medication, class of the drug, and side effects. Includes the steps to properly store meds and maintain the prescription regimen.  Written material given at graduation.   Intimacy: - Group verbal instruction through game format to discuss how heart and lung disease can affect sexual intimacy. Written material given at graduation..   Know Your Numbers and Heart Failure: - Group verbal and visual  instruction to discuss disease risk factors for cardiac and pulmonary disease and treatment options.  Reviews associated critical values for Overweight/Obesity, Hypertension, Cholesterol, and Diabetes.  Discusses basics of heart failure: signs/symptoms and treatments.  Introduces Heart Failure Zone chart for action plan for heart failure.  Written material given at graduation.   Infection Prevention: - Provides verbal and written material to individual with discussion of infection control including proper hand washing and proper equipment cleaning during exercise session. Flowsheet Row Cardiac Rehab from 02/24/2023 in [facility] Cardiac and Pulmonary Rehab  Date 02/24/23  Educator [facility]  Instruction Review Code 1- Verbalizes Understanding       Falls Prevention: - Provides verbal and written material to individual with discussion of falls prevention and safety. Flowsheet Row Cardiac Rehab from 02/24/2023 in [facility] Cardiac and Pulmonary Rehab  Date 02/24/23  Educator [facility]  Instruction Review Code 1- Verbalizes Understanding       Other: -Provides group and verbal instruction on various topics (see comments)   Knowledge Questionnaire Score:   Core Components/Risk Factors/Patient Goals at Admission:  Personal Goals and Risk Factors at Admission - 02/24/23 1729       Core Components/Risk Factors/Patient Goals on Admission    Weight Management Yes    Intervention Weight Management: Develop a combined nutrition and exercise program designed to reach desired caloric intake, while maintaining appropriate intake of nutrient and fiber, sodium and fats, and appropriate energy expenditure required for the weight goal.;Weight Management: Provide education and appropriate resources to help participant work on and attain dietary goals.;Weight Management/Obesity: Establish reasonable short term and long term weight goals.    Admit Weight  220 lb (99.8 kg)    Goal Weight: Short Term 215 lb (97.5 kg)    Goal  Weight: Long Term 200 lb (90.7 kg)    Expected Outcomes Short Term: Continue to assess and modify interventions until short term weight is achieved;Long Term: Adherence to nutrition and physical activity/exercise program aimed toward attainment of established weight goal;Weight Maintenance: Understanding of the daily nutrition guidelines, which includes 25-35% calories from fat, 7% or less cal from saturated fats, less than 200mg  cholesterol, less than 1.5gm of sodium, & 5 or more servings of fruits and vegetables daily;Weight Loss: Understanding of general recommendations for a balanced deficit meal plan, which promotes 1-2 lb weight loss per week and includes a negative energy balance of [phone] kcal/d;Understanding recommendations for meals to include 15-35% energy as protein, 25-35% energy from fat, 35-60% energy from carbohydrates, less than 200mg  of dietary cholesterol, 20-35 gm of total fiber daily;Understanding of distribution of calorie intake throughout the day with the consumption of 4-5 meals/snacks    Hypertension Yes    Intervention Provide education on lifestyle modifcations including regular physical activity/exercise, weight management, moderate sodium restriction and increased consumption of fresh fruit, vegetables, and low fat dairy, alcohol moderation, and smoking cessation.;Monitor prescription use compliance.    Expected Outcomes Short Term: Continued assessment and intervention until BP is < 140/24mm HG in hypertensive participants. < 130/53mm HG in hypertensive participants with diabetes, heart failure or chronic kidney disease.;Long Term: Maintenance of blood pressure at goal levels.    Lipids Yes    Intervention Provide education and support for participant on nutrition & aerobic/resistive exercise along with prescribed medications to achieve LDL 70mg , HDL >40mg .    Expected Outcomes Short Term: Participant states understanding of desired cholesterol values and is compliant with  medications prescribed. Participant is following exercise prescription and nutrition guidelines.;Long Term: Cholesterol controlled with medications as prescribed, with individualized exercise RX and with personalized nutrition plan. Value goals: LDL < 70mg , HDL > 40 mg.             Education:Diabetes - Individual verbal and written instruction to review signs/symptoms of diabetes, desired ranges of glucose level fasting, after meals and with exercise. Acknowledge that pre and post exercise glucose checks will be done for 3 sessions at entry of program.   Core Components/Risk Factors/Patient Goals Review:    Core Components/Risk Factors/Patient Goals at Discharge (Final Review):    ITP Comments:  ITP Comments     Row Name 02/17/23 1602 02/24/23 1715 03/11/23 1349 03/24/23 1349 03/26/23 1137   ITP Comments Initial phone call completed. Dx can be found in media scan dated 02/24/23. EP orientation scheduled for 6/24 at 3:00 pm. Completed and gym orientation. Initial ITP created and sent for review to Dr. Bethann Punches, Medical Director. 30 Day review completed. Medical Director ITP review done, changes made as directed, and signed approval by Medical Director.   New to program First full day of exercise!  Patient was oriented to gym and equipment including functions, settings, policies, and procedures.  Patient's individual exercise prescription and treatment plan were reviewed.  All starting workloads were established based on the results of the 6 minute walk test done at initial orientation visit.  The plan for exercise progression was also introduced and progression will be customized based on patient's performance and goals. Bond had his initial Eval on 6/24. first session was 7/22. No exercise notes until he has attended sessions    Row Name 04/09/23 1108 05/07/23 0910 05/07/23  1351       ITP Comments 30 Day review completed. Medical Director ITP review done, changes made as directed,  and signed approval by Medical Director.   new to program 30 Day review completed. Medical Director ITP review done, changes made as directed, and signed approval by Medical Director. Hykeem graduated early today from  rehab with 17 sessions completed.  Details of the patient's exercise prescription and what He needs to do in order to continue the prescription and progress were discussed with patient.  Patient was given a copy of prescription and goals.  Patient verbalized understanding. Kalieb plans to continue to exercise by walking.              Comments: Early Discharge

## 2023-05-07 NOTE — Progress Notes (Signed)
30 Day review completed. Medical Director ITP review done, changes made as directed, and signed approval by Medical Director.  

## 2023-05-07 NOTE — Patient Instructions (Signed)
Discharge Patient Instructions  Patient Details  Name: Geoffrey Wells MRN: [id number] Date of Birth: [birth year] Referring Provider:  [facility]   Number of Visits: 36  Reason for Discharge:  Patient reached a stable level of exercise. Patient independent in their exercise. Patient has met program and personal goals.  Diagnosis:  Status post coronary artery stent placement  Initial Exercise Prescription:  Initial Exercise Prescription - 02/24/23 1700       Date of Initial Exercise RX and Referring Provider   Date 02/24/23    Referring Provider [name]      Oxygen   Maintain Oxygen Saturation 88% or higher      Recumbant Bike   Level 2    RPM 50    Watts 15    Minutes 15      NuStep   Level 2    SPM 80    Minutes 15    METs 2.3      REL-XR   Level 2    Speed 50    Minutes 15    METs 2.3      Track   Laps 20    Minutes 15    METs 2.09      Prescription Details   Frequency (times per week) 3    Duration Progress to 30 minutes of continuous aerobic without signs/symptoms of physical distress      Intensity   THRR 40-80% of Max Heartrate 94-128    Ratings of Perceived Exertion 11-13    Perceived Dyspnea 0-4      Progression   Progression Continue to progress workloads to maintain intensity without signs/symptoms of physical distress.      Resistance Training   Training Prescription Yes    Weight 3    Reps 10-15             Discharge Exercise Prescription (Final Exercise Prescription Changes):  Exercise Prescription Changes - 05/06/23 1300       Response to Exercise   Blood Pressure (Admit) 102/60    Blood Pressure (Exit) 118/66    Heart Rate (Admit) 50 bpm    Heart Rate (Exercise) 114 bpm    Heart Rate (Exit) 73 bpm    Rating of Perceived Exertion (Exercise) 14    Symptoms none    Duration Progress to 30 minutes of  aerobic without signs/symptoms of physical distress    Intensity THRR unchanged      Progression    Progression Continue to progress workloads to maintain intensity without signs/symptoms of physical distress.    Average METs 2.56      Resistance Training   Training Prescription Yes    Weight 5 lb    Reps 10-15      Interval Training   Interval Training No      Recumbant Bike   Level 5    Watts 37    Minutes 2.75      T5 Nustep   Level 3    Minutes 15    METs 2.7      Biostep-RELP   Level 3    Minutes 15    METs 2      Track   Laps 30    Minutes 15    METs 2.63      Home Exercise Plan   Plans to continue exercise at Home (comment)   dumbbells at home, walking at least a mile at the park or neighborhood, and going to the gym for  aerobic exercise on a machine   Frequency Add 2 additional days to program exercise sessions.    Initial Home Exercises Provided 04/24/23      Oxygen   Maintain Oxygen Saturation 88% or higher             Functional Capacity:  6 Minute Walk     Row Name 02/24/23 1723 05/07/23 1355       6 Minute Walk   Phase Initial Discharge    Distance 1200 feet 1400 feet    Distance % Change -- 16.7 %    Distance Feet Change -- 200 ft    Walk Time 6 minutes 6 minutes    # of Rest Breaks 0 0    MPH 2.27 2.65    METS 2.3 2.79    RPE 11 11    Perceived Dyspnea  1 0    VO2 Peak 8.05 9.76    Symptoms Yes (comment) No    Comments slight dizziness --    Resting HR 61 bpm 53 bpm    Resting BP 122/70 130/66    Resting Oxygen Saturation  95 % 97 %    Exercise Oxygen Saturation  during 6 min walk 95 % 97 %    Max Ex. HR 89 bpm 100 bpm    Max Ex. BP 138/80 144/72    2 Minute Post BP 128/78 --              Nutrition & Weight - Outcomes:  Pre Biometrics - 02/24/23 1729       Pre Biometrics   Height 5' 10.5" (1.791 m)    Weight 210 lb 8 oz (95.5 kg)    Waist Circumference 40.5 inches    Hip Circumference 44.5 inches    Waist to Hip Ratio 0.91 %    BMI (Calculated) 29.77    Single Leg Stand 3.16 seconds             Post  Biometrics - 05/07/23 1356        Post  Biometrics   Height 5' 10.5" (1.791 m)    Weight 212 lb 6.4 oz (96.3 kg)    Waist Circumference 39 inches    Hip Circumference 41 inches    Waist to Hip Ratio 0.95 %    BMI (Calculated) 30.04    Single Leg Stand 5.4 seconds             Nutrition:  Nutrition Therapy & Goals - 02/24/23 1730       Intervention Plan   Intervention Prescribe, educate and counsel regarding individualized specific dietary modifications aiming towards targeted core components such as weight, hypertension, lipid management, diabetes, heart failure and other comorbidities.    Expected Outcomes Short Term Goal: Understand basic principles of dietary content, such as calories, fat, sodium, cholesterol and nutrients.;Short Term Goal: A plan has been developed with personal nutrition goals set during dietitian appointment.;Long Term Goal: Adherence to prescribed nutrition plan.

## 2023-08-05 ENCOUNTER — Encounter: Payer: Self-pay | Admitting: Emergency Medicine

## 2023-09-11 ENCOUNTER — Emergency Department
Admission: EM | Admit: 2023-09-11 | Discharge: 2023-09-11 | Disposition: A | Discharge status: Home / Self Care | Payer: No Typology Code available for payment source | Attending: Emergency Medicine | Admitting: Emergency Medicine

## 2023-09-11 ENCOUNTER — Other Ambulatory Visit: Payer: Self-pay

## 2023-09-11 DIAGNOSIS — Z5189 Encounter for other specified aftercare: Secondary | ICD-10-CM

## 2023-09-11 DIAGNOSIS — Z7902 Long term (current) use of antithrombotics/antiplatelets: Secondary | ICD-10-CM | POA: Diagnosis not present

## 2023-09-11 DIAGNOSIS — Z4800 Encounter for change or removal of nonsurgical wound dressing: Secondary | ICD-10-CM | POA: Insufficient documentation

## 2023-09-11 DIAGNOSIS — Z7901 Long term (current) use of anticoagulants: Secondary | ICD-10-CM | POA: Insufficient documentation

## 2023-09-11 NOTE — ED Provider Notes (Signed)
   [facility] Provider Note    Event Date/Time   First MD Initiated Contact with Patient 09/11/23 [phone]     (approximate)   History   Wound Check   HPI  Geoffrey Wells is a 78 y.o. male   Past medical history of multiple medical comorbidities on Plavix and Eliquis  who presents to the emergency department with bleeding from a nose biopsy site earlier today.  He had a biopsy on the left external nose and has been oozing bleeding and cannot stop despite pressure and gauze pad application.  No other acute medical complaints.       Physical Exam   Triage Vital Signs: ED Triage Vitals [09/11/23 0142]  Encounter Vitals Group     BP (!) 178/90     Systolic BP Percentile      Diastolic BP Percentile      Pulse Rate 60     Resp 16     Temp 97.6 F (36.4 C)     Temp Source Oral     SpO2 100 %     Weight      Height      Head Circumference      Peak Flow      Pain Score 0     Pain Loc      Pain Education      Exclude from Growth Chart     Most recent vital signs: Vitals:   09/11/23 0142  BP: (!) 178/90  Pulse: 60  Resp: 16  Temp: 97.6 F (36.4 C)  SpO2: 100%    General: Awake, no distress.  CV:  Good peripheral perfusion.  Resp:  Normal effort.  Abd:  No distention.  Other:  There is a small superficial appearing abrasion to the external portion of the left nares consistent with biopsy that is oozing blood ever so slowly.   ED Results / Procedures / Treatments   Labs (all labs ordered are listed, but only abnormal results are displayed) Labs Reviewed - No data to display PROCEDURES:  Critical Care performed: No  Procedures   MEDICATIONS ORDERED IN ED: Medications - No data to display  IMPRESSION / MDM / ASSESSMENT AND PLAN / ED COURSE  I reviewed the triage vital signs and the nursing notes.                                Patient's presentation is most consistent with acute complicated illness / injury requiring diagnostic  workup.  Differential diagnosis includes, but is not limited to, wound, infection    MDM:    Hemostasis achieved by applying hemostatic gauze, Steri-Strips, and tissue adhesive.  Anticipatory guidance given and he will follow-up with primary doctor.        FINAL CLINICAL IMPRESSION(S) / ED DIAGNOSES   Final diagnoses:  Visit for wound check     Rx / DC Orders   ED Discharge Orders     None        Note:  This document was prepared using Dragon voice recognition software and may include unintentional dictation errors.    Cyrena Mylar, MD 09/11/23 [phone]

## 2023-09-11 NOTE — ED Triage Notes (Signed)
 Pt to ed from home via POV for wound complication. Pt had biopsy on his nose 2 days ago. Pt wound started bleeding tonight. Wound is on the outside of the left nostril. No bleeding in triage as it is bandaged. Pt is caox4, in no acute distress and ambulatory in triage.

## 2023-09-11 NOTE — Discharge Instructions (Addendum)
 The applied hemostatic agents will loosen on their own after several days.  If you have bleeding through these bandages that you cannot control with direct pressure come back to the emergency department.   If you see any signs of infection like spreading redness, pus coming from the wound, extreme pain, fevers, chills or any other worsening doctor right away or come back to the emergency department.

## 2024-01-24 ENCOUNTER — Ambulatory Visit

## 2024-01-27 ENCOUNTER — Ambulatory Visit

## 2024-08-17 ENCOUNTER — Emergency Department
Admission: EM | Admit: 2024-08-17 | Discharge: 2024-08-18 | Disposition: A | Discharge status: Home / Self Care | Attending: Emergency Medicine | Admitting: Emergency Medicine

## 2024-08-17 ENCOUNTER — Other Ambulatory Visit: Payer: Self-pay

## 2024-08-17 ENCOUNTER — Emergency Department

## 2024-08-17 DIAGNOSIS — S0990XA Unspecified injury of head, initial encounter: Secondary | ICD-10-CM | POA: Diagnosis present

## 2024-08-17 DIAGNOSIS — Z8673 Personal history of transient ischemic attack (TIA), and cerebral infarction without residual deficits: Secondary | ICD-10-CM | POA: Diagnosis not present

## 2024-08-17 DIAGNOSIS — Z7901 Long term (current) use of anticoagulants: Secondary | ICD-10-CM | POA: Diagnosis not present

## 2024-08-17 DIAGNOSIS — W01198A Fall on same level from slipping, tripping and stumbling with subsequent striking against other object, initial encounter: Secondary | ICD-10-CM | POA: Diagnosis not present

## 2024-08-17 NOTE — ED Triage Notes (Addendum)
 Pt tripped in the dark while taking off the dogs leash, pt reports he had a stroke July 2023. Pt takes eliquis  daily. Pt reports he did hit his head, denies LOC. Pt hit his head on a brick.

## 2024-08-18 NOTE — ED Provider Notes (Addendum)
 [facility] Provider Note    Event Date/Time   First MD Initiated Contact with Patient 08/17/24 2353     (approximate)   History   Fall   HPI  Geoffrey Wells is a 78 y.o. male   Past medical history of prior stroke, on Eliquis  here with mechanical fall and head strike.  Was in the dark and tripped over something on the floor and struck the left side of his head.  No loss of consciousness.  No other injuries and was able to get up on his own and walk.  No other acute medical complaints.  Residual dysarthria from his stroke years ago and no other acute neurologic changes.  Independent Historian contributed to assessment above: Wife at bedside corroborates information above    Physical Exam   Triage Vital Signs: ED Triage Vitals  Encounter Vitals Group     BP 08/17/24 2348 (!) 202/93     Girls Systolic BP Percentile --      Girls Diastolic BP Percentile --      Boys Systolic BP Percentile --      Boys Diastolic BP Percentile --      Pulse Rate 08/17/24 2348 75     Resp 08/17/24 2348 20     Temp 08/17/24 2348 97.6 F (36.4 C)     Temp src --      SpO2 08/17/24 2348 99 %     Weight 08/17/24 2347 210 lb (95.3 kg)     Height 08/17/24 2347 6' 2 (1.88 m)     Head Circumference --      Peak Flow --      Pain Score --      Pain Loc --      Pain Education --      Exclude from Growth Chart --     Most recent vital signs: Vitals:   08/17/24 2348  BP: (!) 202/93  Pulse: 75  Resp: 20  Temp: 97.6 F (36.4 C)  SpO2: 99%    General: Awake, no distress.  CV:  Good peripheral perfusion.  Resp:  Normal effort.  Abd:  No distention.  Other:  Well-appearing no external signs of injury on the side of his head that he struck.  Neck supple full range of motion.  Head to toe examination shows no other acute injury.  Neurologic exam unremarkable aside from chronic unchanged dysarthria, no significant facial asymmetry or motor or sensory deficits on my  exam.   ED Results / Procedures / Treatments   Labs (all labs ordered are listed, but only abnormal results are displayed) Labs Reviewed - No data to display  RADIOLOGY I independently reviewed and interpreted CT head and see no obvious bleeding or midline shift I also reviewed radiologist's formal read.   PROCEDURES:  Critical Care performed: No  Procedures   MEDICATIONS ORDERED IN ED: Medications - No data to display   IMPRESSION / MDM / ASSESSMENT AND PLAN / ED COURSE  I reviewed the triage vital signs and the nursing notes.                                Patient's presentation is most consistent with acute presentation with potential threat to life or bodily function.  Differential diagnosis includes, but is not limited to, mechanical fall leading to head injury, ICH, skull fracture, neck injury    MDM:    Mechanical  fall on blood thinner with head strike getting a head CT and neck as well.  No other traumatic injuries noted on exam nor by patient report.  No other acute medical complaints defer medical workup.  Anticipate discharge if unremarkable imaging.       FINAL CLINICAL IMPRESSION(S) / ED DIAGNOSES   Final diagnoses:  Fall, initial encounter  Traumatic injury of head, initial encounter     Rx / DC Orders   ED Discharge Orders     None        Note:  This document was prepared using Dragon voice recognition software and may include unintentional dictation errors.    Cyrena Mylar, MD 08/18/24 ROBERTA    Cyrena Mylar, MD 08/18/24 JOYICE    Cyrena Mylar, MD 08/18/24 [phone]

## 2024-08-18 NOTE — Discharge Instructions (Addendum)
 Fortunately your CT scan did not show any skull fractures or bleeding in or around the brain.  Thank you for choosing us  for your health care today!  Please see your primary doctor this week for a follow up appointment.   If you have any new, worsening, or unexpected symptoms call your doctor right away or come back to the emergency department for reevaluation.  It was my pleasure to care for you today.   Ginnie EDISON Cyrena, MD

## 2024-08-18 NOTE — ED Notes (Signed)
Patient verbalizes understanding of discharge instructions. Opportunity for questioning and answers were provided. Armband removed by staff, pt discharged from ED. Ambulated out to lobby with wife
